# Patient Record
Sex: Female | Born: 1940
Health system: Southern US, Community
[De-identification: ages and names within clinical notes are randomized; demographics above are authoritative.]

## PROBLEM LIST (undated history)

## (undated) DIAGNOSIS — K219 Gastro-esophageal reflux disease without esophagitis: Secondary | ICD-10-CM

## (undated) DIAGNOSIS — M069 Rheumatoid arthritis, unspecified: Secondary | ICD-10-CM

## (undated) DIAGNOSIS — G629 Polyneuropathy, unspecified: Secondary | ICD-10-CM

## (undated) DIAGNOSIS — G4730 Sleep apnea, unspecified: Secondary | ICD-10-CM

## (undated) DIAGNOSIS — T8859XA Other complications of anesthesia, initial encounter: Secondary | ICD-10-CM

## (undated) DIAGNOSIS — D126 Benign neoplasm of colon, unspecified: Secondary | ICD-10-CM

## (undated) DIAGNOSIS — T4145XA Adverse effect of unspecified anesthetic, initial encounter: Secondary | ICD-10-CM

## (undated) DIAGNOSIS — Z9289 Personal history of other medical treatment: Secondary | ICD-10-CM

## (undated) DIAGNOSIS — K449 Diaphragmatic hernia without obstruction or gangrene: Secondary | ICD-10-CM

## (undated) DIAGNOSIS — I10 Essential (primary) hypertension: Secondary | ICD-10-CM

## (undated) DIAGNOSIS — S060XAA Concussion with loss of consciousness status unknown, initial encounter: Secondary | ICD-10-CM

## (undated) HISTORY — DX: Rheumatoid arthritis, unspecified: M06.9

## (undated) HISTORY — DX: Sleep apnea, unspecified: G47.30

## (undated) HISTORY — DX: Diaphragmatic hernia without obstruction or gangrene: K44.9

## (undated) HISTORY — PX: SQUAMOUS CELL CARCINOMA EXCISION: SHX2433

## (undated) HISTORY — PX: EYE SURGERY: SHX253

## (undated) HISTORY — DX: Gastro-esophageal reflux disease without esophagitis: K21.9

## (undated) HISTORY — DX: Benign neoplasm of colon, unspecified: D12.6

## (undated) HISTORY — PX: TRABECULECTOMY: SHX107

## (undated) HISTORY — PX: CATARACT EXTRACTION, BILATERAL: SHX1313

## (undated) HISTORY — PX: COLONOSCOPY W/ POLYPECTOMY: SHX1380

## (undated) HISTORY — DX: Concussion with loss of consciousness status unknown, initial encounter: S06.0XAA

## (undated) HISTORY — PX: UPPER GASTROINTESTINAL ENDOSCOPY: SHX188

## (undated) HISTORY — DX: Polyneuropathy, unspecified: G62.9

## (undated) HISTORY — PX: DILATION AND CURETTAGE OF UTERUS: SHX78

## (undated) HISTORY — PX: SHOULDER SURGERY: SHX246

## (undated) HISTORY — DX: Personal history of other medical treatment: Z92.89

---

## 1998-01-09 ENCOUNTER — Other Ambulatory Visit: Admission: RE | Admit: 1998-01-09 | Discharge: 1998-01-09 | Payer: Self-pay | Admitting: Obstetrics and Gynecology

## 1999-01-22 ENCOUNTER — Other Ambulatory Visit: Admission: RE | Admit: 1999-01-22 | Discharge: 1999-01-22 | Payer: Self-pay | Admitting: Obstetrics and Gynecology

## 1999-05-11 ENCOUNTER — Encounter: Payer: Self-pay | Admitting: Internal Medicine

## 1999-05-11 ENCOUNTER — Ambulatory Visit (HOSPITAL_COMMUNITY): Admission: RE | Admit: 1999-05-11 | Discharge: 1999-05-11 | Payer: Self-pay | Admitting: Internal Medicine

## 2000-01-24 ENCOUNTER — Other Ambulatory Visit: Admission: RE | Admit: 2000-01-24 | Discharge: 2000-01-24 | Payer: Self-pay | Admitting: Obstetrics and Gynecology

## 2001-01-26 ENCOUNTER — Other Ambulatory Visit: Admission: RE | Admit: 2001-01-26 | Discharge: 2001-01-26 | Payer: Self-pay | Admitting: Obstetrics and Gynecology

## 2001-10-07 ENCOUNTER — Encounter (INDEPENDENT_AMBULATORY_CARE_PROVIDER_SITE_OTHER): Payer: Self-pay | Admitting: Specialist

## 2001-10-07 ENCOUNTER — Ambulatory Visit (HOSPITAL_COMMUNITY): Admission: RE | Admit: 2001-10-07 | Discharge: 2001-10-07 | Payer: Self-pay | Admitting: Obstetrics and Gynecology

## 2002-06-03 DIAGNOSIS — D126 Benign neoplasm of colon, unspecified: Secondary | ICD-10-CM

## 2002-06-03 HISTORY — DX: Benign neoplasm of colon, unspecified: D12.6

## 2003-03-10 ENCOUNTER — Encounter: Payer: Self-pay | Admitting: Rheumatology

## 2003-03-10 ENCOUNTER — Encounter: Admission: RE | Admit: 2003-03-10 | Discharge: 2003-03-10 | Payer: Self-pay | Admitting: Rheumatology

## 2004-03-02 ENCOUNTER — Encounter: Payer: Self-pay | Admitting: Internal Medicine

## 2004-04-13 ENCOUNTER — Ambulatory Visit: Payer: Self-pay | Admitting: Internal Medicine

## 2004-11-20 ENCOUNTER — Ambulatory Visit: Payer: Self-pay | Admitting: Internal Medicine

## 2005-02-26 ENCOUNTER — Encounter (INDEPENDENT_AMBULATORY_CARE_PROVIDER_SITE_OTHER): Payer: Self-pay | Admitting: *Deleted

## 2005-02-26 ENCOUNTER — Ambulatory Visit (HOSPITAL_COMMUNITY): Admission: RE | Admit: 2005-02-26 | Discharge: 2005-02-26 | Payer: Self-pay | Admitting: Obstetrics and Gynecology

## 2005-03-25 ENCOUNTER — Ambulatory Visit: Payer: Self-pay | Admitting: Internal Medicine

## 2005-06-26 ENCOUNTER — Ambulatory Visit: Payer: Self-pay | Admitting: Internal Medicine

## 2005-07-10 ENCOUNTER — Ambulatory Visit: Payer: Self-pay | Admitting: Internal Medicine

## 2005-07-16 ENCOUNTER — Ambulatory Visit: Payer: Self-pay | Admitting: Gastroenterology

## 2005-07-19 ENCOUNTER — Ambulatory Visit: Payer: Self-pay | Admitting: Gastroenterology

## 2005-07-22 ENCOUNTER — Ambulatory Visit: Payer: Self-pay | Admitting: Gastroenterology

## 2005-08-13 ENCOUNTER — Ambulatory Visit: Payer: Self-pay | Admitting: Gastroenterology

## 2006-04-10 ENCOUNTER — Ambulatory Visit: Payer: Self-pay | Admitting: Internal Medicine

## 2006-07-10 ENCOUNTER — Ambulatory Visit: Payer: Self-pay | Admitting: Internal Medicine

## 2006-10-17 DIAGNOSIS — N649 Disorder of breast, unspecified: Secondary | ICD-10-CM | POA: Insufficient documentation

## 2006-10-17 DIAGNOSIS — E785 Hyperlipidemia, unspecified: Secondary | ICD-10-CM | POA: Insufficient documentation

## 2006-10-17 DIAGNOSIS — I059 Rheumatic mitral valve disease, unspecified: Secondary | ICD-10-CM | POA: Insufficient documentation

## 2006-10-22 ENCOUNTER — Encounter: Payer: Self-pay | Admitting: Internal Medicine

## 2006-10-22 ENCOUNTER — Ambulatory Visit: Payer: Self-pay | Admitting: Internal Medicine

## 2006-10-30 ENCOUNTER — Encounter: Payer: Self-pay | Admitting: Internal Medicine

## 2006-11-13 ENCOUNTER — Encounter: Payer: Self-pay | Admitting: Internal Medicine

## 2007-01-19 ENCOUNTER — Telehealth (INDEPENDENT_AMBULATORY_CARE_PROVIDER_SITE_OTHER): Payer: Self-pay | Admitting: *Deleted

## 2007-02-18 ENCOUNTER — Telehealth (INDEPENDENT_AMBULATORY_CARE_PROVIDER_SITE_OTHER): Payer: Self-pay | Admitting: *Deleted

## 2007-02-24 ENCOUNTER — Encounter: Payer: Self-pay | Admitting: Internal Medicine

## 2007-04-07 ENCOUNTER — Ambulatory Visit: Payer: Self-pay | Admitting: Internal Medicine

## 2007-05-14 ENCOUNTER — Encounter: Payer: Self-pay | Admitting: Internal Medicine

## 2007-08-13 ENCOUNTER — Ambulatory Visit: Payer: Self-pay | Admitting: Internal Medicine

## 2007-08-13 DIAGNOSIS — T887XXA Unspecified adverse effect of drug or medicament, initial encounter: Secondary | ICD-10-CM | POA: Insufficient documentation

## 2007-08-18 ENCOUNTER — Encounter (INDEPENDENT_AMBULATORY_CARE_PROVIDER_SITE_OTHER): Payer: Self-pay | Admitting: *Deleted

## 2007-08-18 LAB — CONVERTED CEMR LAB
Basophils Absolute: 0 10*3/uL (ref 0.0–0.1)
Basophils Relative: 1.1 % — ABNORMAL HIGH (ref 0.0–1.0)
Eosinophils Absolute: 0.1 10*3/uL (ref 0.0–0.6)
Eosinophils Relative: 2 % (ref 0.0–5.0)
HCT: 37.9 % (ref 36.0–46.0)
Hemoglobin: 12.6 g/dL (ref 12.0–15.0)
Lymphocytes Relative: 55.2 % — ABNORMAL HIGH (ref 12.0–46.0)
MCHC: 33.2 g/dL (ref 30.0–36.0)
MCV: 94.6 fL (ref 78.0–100.0)
Monocytes Absolute: 0.4 10*3/uL (ref 0.2–0.7)
Monocytes Relative: 10.1 % (ref 3.0–11.0)
Neutro Abs: 1.4 10*3/uL (ref 1.4–7.7)
Neutrophils Relative %: 31.6 % — ABNORMAL LOW (ref 43.0–77.0)
Platelets: 366 10*3/uL (ref 150–400)
RBC: 4.01 M/uL (ref 3.87–5.11)
RDW: 12.7 % (ref 11.5–14.6)
WBC: 4.3 10*3/uL — ABNORMAL LOW (ref 4.5–10.5)

## 2007-10-20 ENCOUNTER — Encounter: Payer: Self-pay | Admitting: Internal Medicine

## 2007-12-23 ENCOUNTER — Encounter: Payer: Self-pay | Admitting: Internal Medicine

## 2008-01-22 ENCOUNTER — Encounter: Payer: Self-pay | Admitting: Internal Medicine

## 2008-02-24 ENCOUNTER — Ambulatory Visit: Payer: Self-pay | Admitting: Internal Medicine

## 2008-02-25 ENCOUNTER — Encounter: Payer: Self-pay | Admitting: Internal Medicine

## 2008-03-03 ENCOUNTER — Encounter: Payer: Self-pay | Admitting: Internal Medicine

## 2008-04-04 ENCOUNTER — Telehealth: Payer: Self-pay | Admitting: Internal Medicine

## 2008-04-06 ENCOUNTER — Telehealth (INDEPENDENT_AMBULATORY_CARE_PROVIDER_SITE_OTHER): Payer: Self-pay | Admitting: *Deleted

## 2008-04-12 ENCOUNTER — Ambulatory Visit: Payer: Self-pay | Admitting: Internal Medicine

## 2008-05-17 ENCOUNTER — Encounter: Payer: Self-pay | Admitting: Internal Medicine

## 2008-09-23 ENCOUNTER — Encounter: Payer: Self-pay | Admitting: Internal Medicine

## 2008-12-07 ENCOUNTER — Ambulatory Visit: Payer: Self-pay | Admitting: Internal Medicine

## 2008-12-07 DIAGNOSIS — K219 Gastro-esophageal reflux disease without esophagitis: Secondary | ICD-10-CM | POA: Insufficient documentation

## 2008-12-07 DIAGNOSIS — Z860100 Personal history of colon polyps, unspecified: Secondary | ICD-10-CM | POA: Insufficient documentation

## 2008-12-07 DIAGNOSIS — M069 Rheumatoid arthritis, unspecified: Secondary | ICD-10-CM | POA: Insufficient documentation

## 2008-12-13 ENCOUNTER — Encounter (INDEPENDENT_AMBULATORY_CARE_PROVIDER_SITE_OTHER): Payer: Self-pay | Admitting: *Deleted

## 2008-12-13 LAB — CONVERTED CEMR LAB
Calcium: 9.6 mg/dL (ref 8.4–10.5)
Hgb A1c MFr Bld: 5.7 % (ref 4.6–6.5)
TSH: 1.02 microintl units/mL (ref 0.35–5.50)

## 2008-12-14 ENCOUNTER — Encounter (INDEPENDENT_AMBULATORY_CARE_PROVIDER_SITE_OTHER): Payer: Self-pay | Admitting: *Deleted

## 2008-12-27 LAB — CONVERTED CEMR LAB: Vit D, 25-Hydroxy: 136 ng/mL — ABNORMAL HIGH (ref 30–89)

## 2009-01-19 ENCOUNTER — Encounter: Payer: Self-pay | Admitting: Internal Medicine

## 2009-02-09 ENCOUNTER — Ambulatory Visit: Payer: Self-pay | Admitting: Gastroenterology

## 2009-02-16 ENCOUNTER — Encounter: Payer: Self-pay | Admitting: Internal Medicine

## 2009-02-24 ENCOUNTER — Ambulatory Visit: Payer: Self-pay | Admitting: Gastroenterology

## 2009-02-24 ENCOUNTER — Encounter: Payer: Self-pay | Admitting: Gastroenterology

## 2009-02-24 LAB — CONVERTED CEMR LAB: UREASE: NEGATIVE

## 2009-02-28 ENCOUNTER — Encounter: Payer: Self-pay | Admitting: Gastroenterology

## 2009-03-01 ENCOUNTER — Telehealth (INDEPENDENT_AMBULATORY_CARE_PROVIDER_SITE_OTHER): Payer: Self-pay | Admitting: *Deleted

## 2009-03-06 ENCOUNTER — Encounter: Payer: Self-pay | Admitting: Internal Medicine

## 2009-03-10 ENCOUNTER — Ambulatory Visit: Payer: Self-pay | Admitting: Internal Medicine

## 2009-03-13 ENCOUNTER — Encounter (INDEPENDENT_AMBULATORY_CARE_PROVIDER_SITE_OTHER): Payer: Self-pay | Admitting: *Deleted

## 2009-03-13 ENCOUNTER — Telehealth (INDEPENDENT_AMBULATORY_CARE_PROVIDER_SITE_OTHER): Payer: Self-pay | Admitting: *Deleted

## 2009-03-13 LAB — CONVERTED CEMR LAB
ALT: 21 units/L (ref 0–35)
AST: 24 units/L (ref 0–37)
Albumin: 3.9 g/dL (ref 3.5–5.2)
Alkaline Phosphatase: 60 units/L (ref 39–117)
Bilirubin, Direct: 0.1 mg/dL (ref 0.0–0.3)
Cholesterol: 155 mg/dL (ref 0–200)
HDL: 50.5 mg/dL (ref >39.00)
LDL Cholesterol: 80 mg/dL (ref 0–99)
Total Bilirubin: 0.7 mg/dL (ref 0.3–1.2)
Total CHOL/HDL Ratio: 3
Total Protein: 6.8 g/dL (ref 6.0–8.3)
Triglycerides: 125 mg/dL (ref 0.0–149.0)
VLDL: 25 mg/dL (ref 0.0–40.0)

## 2009-03-21 ENCOUNTER — Encounter: Payer: Self-pay | Admitting: Internal Medicine

## 2009-03-30 ENCOUNTER — Telehealth: Payer: Self-pay | Admitting: Physician Assistant

## 2009-04-04 ENCOUNTER — Ambulatory Visit: Payer: Self-pay | Admitting: Internal Medicine

## 2009-04-07 ENCOUNTER — Ambulatory Visit: Payer: Self-pay | Admitting: Internal Medicine

## 2009-04-08 LAB — CONVERTED CEMR LAB
ALT: 40 units/L — ABNORMAL HIGH (ref 0–35)
AST: 37 units/L (ref 0–37)
Albumin: 3.4 g/dL — ABNORMAL LOW (ref 3.5–5.2)
Alkaline Phosphatase: 77 units/L (ref 39–117)
Basophils Absolute: 0 10*3/uL (ref 0.0–0.1)
Basophils Relative: 0.7 % (ref 0.0–3.0)
Bilirubin, Direct: 0 mg/dL (ref 0.0–0.3)
Eosinophils Absolute: 0.1 10*3/uL (ref 0.0–0.7)
Eosinophils Relative: 2.4 % (ref 0.0–5.0)
HCT: 36.4 % (ref 36.0–46.0)
Hemoglobin: 12.3 g/dL (ref 12.0–15.0)
Lymphocytes Relative: 29.9 % (ref 12.0–46.0)
Lymphs Abs: 1.7 10*3/uL (ref 0.7–4.0)
MCHC: 33.9 g/dL (ref 30.0–36.0)
MCV: 95.8 fL (ref 78.0–100.0)
Monocytes Absolute: 0.6 10*3/uL (ref 0.1–1.0)
Monocytes Relative: 9.8 % (ref 3.0–12.0)
Neutro Abs: 3.3 10*3/uL (ref 1.4–7.7)
Neutrophils Relative %: 57.2 % (ref 43.0–77.0)
Platelets: 400 10*3/uL (ref 150.0–400.0)
RBC: 3.8 M/uL — ABNORMAL LOW (ref 3.87–5.11)
RDW: 13 % (ref 11.5–14.6)
Total Bilirubin: 0.4 mg/dL (ref 0.3–1.2)
Total Protein: 7 g/dL (ref 6.0–8.3)
Vitamin B-12: 557 pg/mL (ref 211–911)
WBC: 5.7 10*3/uL (ref 4.5–10.5)

## 2009-04-10 ENCOUNTER — Encounter (INDEPENDENT_AMBULATORY_CARE_PROVIDER_SITE_OTHER): Payer: Self-pay | Admitting: *Deleted

## 2009-04-20 ENCOUNTER — Telehealth: Payer: Self-pay | Admitting: Gastroenterology

## 2009-05-16 ENCOUNTER — Telehealth (INDEPENDENT_AMBULATORY_CARE_PROVIDER_SITE_OTHER): Payer: Self-pay | Admitting: *Deleted

## 2009-06-28 ENCOUNTER — Ambulatory Visit: Payer: Self-pay | Admitting: Internal Medicine

## 2009-06-28 DIAGNOSIS — H6980 Other specified disorders of Eustachian tube, unspecified ear: Secondary | ICD-10-CM | POA: Insufficient documentation

## 2009-07-06 ENCOUNTER — Encounter: Payer: Self-pay | Admitting: Internal Medicine

## 2009-07-10 ENCOUNTER — Telehealth: Payer: Self-pay | Admitting: Internal Medicine

## 2009-07-21 ENCOUNTER — Telehealth (INDEPENDENT_AMBULATORY_CARE_PROVIDER_SITE_OTHER): Payer: Self-pay | Admitting: *Deleted

## 2009-07-28 ENCOUNTER — Encounter: Payer: Self-pay | Admitting: Internal Medicine

## 2009-10-16 ENCOUNTER — Encounter: Payer: Self-pay | Admitting: Internal Medicine

## 2009-11-22 ENCOUNTER — Encounter: Payer: Self-pay | Admitting: Internal Medicine

## 2009-12-14 ENCOUNTER — Ambulatory Visit: Payer: Self-pay | Admitting: Internal Medicine

## 2009-12-14 DIAGNOSIS — N959 Unspecified menopausal and perimenopausal disorder: Secondary | ICD-10-CM | POA: Insufficient documentation

## 2009-12-14 DIAGNOSIS — R42 Dizziness and giddiness: Secondary | ICD-10-CM | POA: Insufficient documentation

## 2009-12-21 LAB — CONVERTED CEMR LAB
Calcium: 9.9 mg/dL (ref 8.4–10.5)
Cholesterol: 238 mg/dL — ABNORMAL HIGH (ref 0–200)
Direct LDL: 160.4 mg/dL
Glucose, Bld: 94 mg/dL (ref 70–99)
HDL: 73.2 mg/dL (ref >39.00)
Total CHOL/HDL Ratio: 3
Triglycerides: 81 mg/dL (ref 0.0–149.0)
VLDL: 16.2 mg/dL (ref 0.0–40.0)
Vit D, 25-Hydroxy: 46 ng/mL (ref 30–89)

## 2010-01-30 ENCOUNTER — Ambulatory Visit: Payer: Self-pay | Admitting: Gastroenterology

## 2010-01-30 ENCOUNTER — Telehealth: Payer: Self-pay | Admitting: Gastroenterology

## 2010-01-30 DIAGNOSIS — K449 Diaphragmatic hernia without obstruction or gangrene: Secondary | ICD-10-CM | POA: Insufficient documentation

## 2010-01-30 DIAGNOSIS — R490 Dysphonia: Secondary | ICD-10-CM | POA: Insufficient documentation

## 2010-02-19 ENCOUNTER — Encounter: Payer: Self-pay | Admitting: Gastroenterology

## 2010-02-19 ENCOUNTER — Ambulatory Visit (HOSPITAL_COMMUNITY): Admission: RE | Admit: 2010-02-19 | Discharge: 2010-02-19 | Payer: Self-pay | Admitting: Surgery

## 2010-02-26 ENCOUNTER — Ambulatory Visit: Payer: Self-pay | Admitting: Gastroenterology

## 2010-03-07 ENCOUNTER — Encounter: Payer: Self-pay | Admitting: Internal Medicine

## 2010-03-14 ENCOUNTER — Encounter: Payer: Self-pay | Admitting: Internal Medicine

## 2010-03-19 ENCOUNTER — Telehealth: Payer: Self-pay | Admitting: Gastroenterology

## 2010-03-21 ENCOUNTER — Encounter: Payer: Self-pay | Admitting: Gastroenterology

## 2010-04-09 ENCOUNTER — Telehealth: Payer: Self-pay | Admitting: Gastroenterology

## 2010-04-10 ENCOUNTER — Telehealth: Payer: Self-pay | Admitting: Internal Medicine

## 2010-04-19 ENCOUNTER — Encounter: Payer: Self-pay | Admitting: Internal Medicine

## 2010-07-03 NOTE — Assessment & Plan Note (Signed)
Summary: sinus/kdc   Vital Signs:  Patient profile:   70 year old female Weight:      141.2 pounds Temp:     98.6 degrees F oral Pulse rate:   64 / minute Resp:     13 per minute BP sitting:   110 / 70  (left arm) Cuff size:   large  Vitals Entered By: Shonna Chock (June 28, 2009 10:54 AM) CC: Sinus concerns x 1 month, URI symptoms Comments REVIEWED MED LIST, PATIENT AGREED DOSE AND INSTRUCTION CORRECT    Primary Care Provider:  Marga Melnick, MD  CC:  Sinus concerns x 1 month and URI symptoms.  History of Present Illness:  URI Symptoms      This is a 70 year old woman who presents with URI symptoms as ear pressure & nasal congestion X 3 weeks .  The patient reports nasal congestion and ear pressure w/o  purulent nasal discharge, sore throat, dry cough, or  sick contacts.  The patient denies fever, stiff neck, dyspnea, wheezing, rash, vomiting,or diarrhea.  The patient also reports sneezing and severe fatigue(chronic).  The patient denies itchy watery eyes, headache, and muscle aches.  Risk factors for Strep sinusitis include bilateral facial pain (R > L) and tooth pain on R.  Dr Lindi Adie noted "sinus changes " on R 01/25. No TMJ diagnosis made.The patient denies the following risk factors for Strep sinusitis: unilateral nasal discharge and tender adenopathy.  Rx: Neti pot , Nasonex. Treated for sinusitis in 04/2009. Labs to monitor MTX & Embrel  for RA("Blood counts, kidney, liver" ) done 06/26/2009 were "normal".  Allergies: 1)  ! Sulfa  Review of Systems General:  Denies chills and sweats. ENT:  No fronal headaches or purulence. Resp:  Denies cough, shortness of breath, sputum productive, and wheezing.  Physical Exam  General:  well-nourished,in no acute distress; alert,appropriate and cooperative throughout examination Ears:  External ear exam shows no significant lesions or deformities.  Otoscopic examination reveals clear canals, tympanic membranes are intact bilaterally  without bulging, retraction, inflammation or discharge. Hearing is grossly normal bilaterally.TMs dull Nose:  External nasal examination shows no deformity or inflammation. Nasal mucosa are pink and moist without lesions or exudates. Mouth:  Oral mucosa and oropharynx without lesions or exudates.  Teeth in good repair. Minimal erythema  of palate.Minor dislocation of TMJs. Lungs:  Normal respiratory effort, chest expands symmetrically. Lungs are clear to auscultation, no crackles or wheezes. Skin:  Bandaged op site L neck (Epidermoid inclusion cyst resected 01/19) Cervical Nodes:  No lymphadenopathy noted Axillary Nodes:  No palpable lymphadenopathy   Impression & Recommendations:  Problem # 1:  SINUSITIS- ACUTE-NOS (ICD-461.9)  The following medications were removed from the medication list:    Amoxicillin 500 Mg Caps (Amoxicillin) .Marland Kitchen... 1 three times a day Her updated medication list for this problem includes:    Cefuroxime Axetil 500 Mg Tabs (Cefuroxime axetil) .Marland Kitchen... 1 two times a day    Fluticasone Propionate 50 Mcg/act Susp (Fluticasone propionate) .Marland Kitchen... 1 spray two times a day as discussed  Problem # 2:  EUSTACHIAN TUBE DYSFUNCTION, RIGHT (ICD-381.81)  Problem # 3:  RHEUMATOID ARTHRITIS (ICD-714.0) on Immunosuppresants(Embrel & MTX)  Complete Medication List: 1)  Methatrexate  .... As directed 2)  Premarin 0.625 Mg Tabs (Estrogens conjugated) .Marland Kitchen.. 1 by mouth once daily 3)  Prometrium 100 Mg Caps (Progesterone micronized) .Marland Kitchen.. 1 by mouth once daily 4)  Multivitamin  .Marland Kitchen.. 1 tablet by mouth once daily 5)  Vit D  and Caltrate  .Marland Kitchen.. 1 tablet by mouth once daily 6)  Folic Acid 1 Mg Tabs (Folic acid) .... 2 by mouth once daily 7)  Bayer Low Strength 81 Mg Tbec (Aspirin) .Marland Kitchen.. 1 by mouth once daily 8)  Istalol 0.5 % Soln (Timolol maleate) .... Apply 1 drop daily (am) 9)  Alphagan P 0.15 % Soln (Brimonidine tartrate) .Marland Kitchen.. 1 dropp in left eye three times a day 10)  Xalatan 0.005 % Soln  (Latanoprost) .... Daily 11)  Prednisolone Acetate 1 % Susp (Prednisolone acetate) .... As directed 12)  Omega 3 Krill  .Marland Kitchen.. 1 by mouth once daily 13)  Ambien 10 Mg Tabs (Zolpidem tartrate) .Marland Kitchen.. 1 tablet by mouth at bedtime as needed 14)  Enbrel 50 Mg/ml Soln (Etanercept) .Marland Kitchen.. 1 injection weekly 15)  Methotrexate Sodium 250 Mg/52ml Soln (Methotrexate sodium) .... 0.8 mg once weekly 16)  Align Caps (Misc intestinal flora regulat) .... Take one capsule by mouth daily 17)  Nexium 40 Mg Cpdr (Esomeprazole magnesium) .Marland Kitchen.. 1 capsule each day 30 minutes before meal 18)  Pilocarpine Hcl 0.5 % Soln (Pilocarpine hcl) .... 4 times daily 19)  Cefuroxime Axetil 500 Mg Tabs (Cefuroxime axetil) .Marland Kitchen.. 1 two times a day 20)  Fluticasone Propionate 50 Mcg/act Susp (Fluticasone propionate) .Marland Kitchen.. 1 spray two times a day as discussed  Patient Instructions: 1)  Drink as much fluid as you can tolerate for the next few days. 2)  Look on WebMD OR  LocalsBoard.pl to learn more about your condition (Eustachian Tube Dysfunction). CT of sinuses if no better Prescriptions: FLUTICASONE PROPIONATE 50 MCG/ACT SUSP (FLUTICASONE PROPIONATE) 1 spray two times a day as discussed  #1 x 5   Entered and Authorized by:   Marga Melnick MD   Signed by:   Marga Melnick MD on 06/28/2009   Method used:   Faxed to ...       [facility]. [phone]* (retail)       [phone] [address].       [place], [place]  [address]       Ph: [phone]       Fax: [phone]   RxID:   [id number] CEFUROXIME AXETIL 500 MG TABS (CEFUROXIME AXETIL) 1 two times a day  #20 x 0   Entered and Authorized by:   Marga Melnick MD   Signed by:   Marga Melnick MD on 06/28/2009   Method used:   Faxed to ...       [facility]. [phone]* (retail)       [phone] [address].       [place], [place]  [address]       Ph: [phone]       Fax: [phone]   RxID:    [id number]   Appended Document: sinus/kdc Note : off statin since 04/2009 due to GI issues as per GI PA.Recommend fasting NMR Lipoprofile in next 3 months (272.4)

## 2010-07-03 NOTE — Letter (Signed)
Summary: [facility]  [facility]   Imported By: Lanelle Bal 05/24/2008 11:05:25  _____________________________________________________________________  External Attachment:    Type:   Image     Comment:   External Document

## 2010-07-03 NOTE — Procedures (Signed)
Summary: Colonoscopy   Colonoscopy  Procedure date:  07/22/2005  Findings:      Location:  [facility].    Colonoscopy  Procedure date:  07/22/2005  Findings:      Location:  [facility].   Patient Name: Felicia [name], Felicia [name] MRN:  Procedure Procedures: Colonoscopy CPT: 315 [id number].  Personnel: Endoscopist: Vania Rea. Jarold Motto, MD.  Exam Location: Exam performed in Outpatient Clinic. Outpatient  Patient Consent: Procedure, Alternatives, Risks and Benefits discussed, consent obtained, from patient. Consent was obtained by the RN.  Indications Symptoms: Abdominal pain / bloating. Change in bowel habits.  Surveillance of: Adenomatous Polyp(s).  History  Current Medications: Patient is not currently taking Coumadin.  Pre-Exam Physical: Performed Jul 22, 2005. Cardio-pulmonary exam, Rectal exam, Abdominal exam, Extremity exam, Mental status exam WNL.  Comments: Pt. history reviewed/updated, physical exam performed prior to initiation of sedation? Exam Exam: Extent of exam reached: Ileum, extent intended: Cecum.  The cecum was identified by appendiceal orifice and IC valve. Patient position: on left side. Duration of exam: 20 minutes. Colon retroflexion performed. Images taken. ASA Classification: II. Tolerance: excellent.  Monitoring: Pulse and BP monitoring, Oximetry used. Supplemental O2 given. at 2 Liters.  Colon Prep Used Golytely for colon prep. Prep results: excellent.  Sedation Meds: Patient assessed and found to be appropriate for moderate (conscious) sedation. Fentanyl 50 mcg. given IV. Versed 7 mg. given IV.  Instrument(s): CF 140L. Serial [id number].  Findings - NORMAL EXAM: Ileum. Not Seen: AVM's. Crohn's.  - NORMAL EXAM: Cecum to Rectum. Polyps. AVM's. Colitis. Tumors. Melanosis. Crohn's. Diverticulosis. Hemorrhoids.   Assessment Normal examination.  Events  Unplanned Interventions: No intervention was required.  Plans  Medication Plan: Continue current medications.  Patient Education: Patient given standard instructions for: a normal exam.  Disposition: After procedure patient sent to recovery. After recovery patient sent home.  Scheduling/Referral: Follow-Up prn.    CC: Titus Dubin. Alwyn Ren, MD  This report was created from the original endoscopy report, which was reviewed and signed by the above listed endoscopist.

## 2010-07-03 NOTE — Miscellaneous (Signed)
Summary: needs refills for Dexilant on call-back  Clinical Lists Changes  Medications: Added new medication of DEXILANT 60 MG CPDR (DEXLANSOPRAZOLE) Take 30 min before 1st meal of the day - Signed Rx of DEXILANT 60 MG CPDR (DEXLANSOPRAZOLE) Take 30 min before 1st meal of the day;  #31 x 6;  Signed;  Entered by: [name] Church RN II;  Authorized by: Mardella Layman MD [facility];  Method used: Electronically to [facility]. #[id number]*, [address] [place], [place], [place]  [address], Ph: [phone], Fax: [phone] Observations: Added new observation of ALLERGY REV: Done (02/27/2009 8:48)    Prescriptions: DEXILANT 60 MG CPDR (DEXLANSOPRAZOLE) Take 30 min before 1st meal of the day  #31 x 6   Entered by:   [name] Church RN II   Authorized by:   Mardella Layman MD [facility]   Signed by:   [name] Church RN II on 02/27/2009   Method used:   Electronically to        [facility]. [phone]* (retail)       [phone] [address]       [place], [place]  [address]       Ph: [phone]       Fax: [phone]   RxID:   [id number]

## 2010-07-03 NOTE — Letter (Signed)
Summary: [facility]  [facility]   Imported By: Freddy Jaksch 10/23/2007 13:42:51  _____________________________________________________________________  External Attachment:    Type:   Image     Comment:   External Document  Appended Document: [facility] trochanteric bursitis, seroneg RA

## 2010-07-03 NOTE — Miscellaneous (Signed)
Summary: clotest  Clinical Lists Changes  Problems: Added new problem of GASTRITIS (ICD-535.50) Orders: Added new Test order of TLB-H Pylori Screen Gastric Biopsy (83013-CLOTEST) - Signed 

## 2010-07-03 NOTE — Consult Note (Signed)
Summary: [facility]  [facility]   Imported By: Lanelle Bal 03/11/2008 10:18:54  _____________________________________________________________________  External Attachment:    Type:   Image     Comment:   External Document

## 2010-07-03 NOTE — Progress Notes (Signed)
Summary: HOP---REFILL  Phone Note Call from Patient   Caller: Patient Summary of Call: PATIENT IS CALLING WITH QUESTION ABOUT HER ARTHRIIS (ENBRIL--METHOPREXATE SHOT) IS WHAT SHE IS TAKING NOW. HAVE QUESTION ABOUT THE H1N1 ABOUT TAKING THIS WITH THE MEDICATION SHE IS TAKING  NOW. WOULD IT PROTECT HER FROM ANY INFECTION. Initial call taken by: Freddy Jaksch,  April 04, 2008 9:39 AM  Follow-up for Phone Call        dr Markella Dao pls  pt want to know if she needs to get h1n1 vaccine Follow-up by: Jeremy Johann CMA,  April 05, 2008 11:33 AM  Additional Follow-up for Phone Call Additional follow up Details #1::        The Rheumatologist [place] H1N1 shot due to [facility]  therapy. I would encourage if possible. Additional Follow-up by: Marga Melnick MD,  April 05, 2008 11:50 AM    Additional Follow-up for Phone Call Additional follow up Details #2::    left detail message for pt on machine as to dr hop thoughts on h1n1 vaccine. Follow-up by: Jeremy Johann CMA,  April 05, 2008 4:15 PM

## 2010-07-03 NOTE — Progress Notes (Signed)
Summary: Test results  Phone Note Call from Patient Call back at Home Phone [phone]   Caller: Patient Call For: Dr. Jarold Motto Reason for Call: Lab or Test Results Summary of Call: Asking to speak directly to [facility] about manometry results. Pt. is "frustrated that she had to pursue getting her results instead of the office calling with her results." Initial call taken by: Karna Christmas,  April 09, 2010 2:52 PM  Follow-up for Phone Call        patient upset that she has not yet heard about her result but I advised her that I spoke with her on 10/17 about her results, she does recall that now but thinks we should mail out copies of all reports like Dr. Caryl Never office does I have advised her that she can get her medical records from our medical records dept if she wishes but it is not our practice to mail reoprts. She is also upset that noone in medical records has called her back about getting her records says  that she left a message on their machine 2 weeks ago but she does not know who voicemail it was on. I have transfered her to medical records to speak with Norwood Levo. I also advised her to make an appt with Dr. Alwyn Ren and I have routed him the reports again.  Follow-up by: Harlow Mares CMA Duncan Dull),  April 09, 2010 3:18 PM

## 2010-07-03 NOTE — Assessment & Plan Note (Signed)
Summary: YEARLY AND LAB/CDJ   Vital Signs:  Patient profile:   70 year old female Height:      66 inches Weight:      140.8 pounds BMI:     22.81 Temp:     97.8 degrees F oral Pulse rate:   60 / minute Resp:     14 per minute BP sitting:   118 / 76  (left arm) Cuff size:   large  Vitals Entered By: Shonna Chock CMA (December 14, 2009 10:56 AM) CC: CPX with fasting labs  Comments REVIEWED MED LIST, PATIENT AGREED DOSE AND INSTRUCTION CORRECT    Primary Care Provider:  Marga Melnick, MD  CC:  CPX with fasting labs .  History of Present Illness: Here for Medicare AWV: 1.Risk factors based on Past M, S, F history:RA on immunosuppressant therapy;GERD; colon polyps;dyslipidemia. Labs from Dr Corliss Skains reviewed: H/H 11.9/35.4 on 10/16/2009. 2.Physical Activities: see Entry data 3.Depression/mood: denied 4.Hearing: whisper heard  @ 6 ft 5.ADL's: no limitations despite RA 6.Fall Risk: see Entry data 7.Home Safety: no risk described 8.Height, weight, &visual acuity:vision intact with lenses reading wall charts. Dr Nedra Hai, [facility] monitors Glaucoma every 2-3 months 9.Counseling: none requested 10.Labs ordered based on risk factors: see Orders 11. Referral Coordination: Dr Pollyann Kennedy if Eustachian tube dysfunction no better with meds 12. Care Plan: see Instructions; updating POA,etc 13.  Cognitive Assessment: normal orientation , mood /affect, memory & recall . "WORLD" spelled backwards   Preventive Screening-Counseling & Management  Alcohol-Tobacco     Alcohol drinks/day: 0     Smoking Status: quit > 6 months     Packs/Day: 1/2 ppd X 5 years     Year Quit: 1972  Caffeine-Diet-Exercise     Caffeine use/day: 2 cups/day     Diet Comments: heart healthy     Does Patient Exercise: yes     Type of exercise: Tai Chi     Exercise (avg: min/session): <30     Times/week: 4  Hep-HIV-STD-Contraception     Dental Visit-last 6 months yes     Sun Exposure-Excessive: no  Safety-Violence-Falls   Seat Belt Use: yes     Firearms in the Home: no firearms in the home     Violence in the Home: no risk noted     Fall Risk: no risk despite RA; Tai Chi helps balance      Drug Use:  never.        Blood Transfusions:  no.        Travel History:  none in past year.    Allergies: 1)  ! Sulfa  Past History:  Past Medical History: Hyperlipidemia:NMR  2005: LDL 120(1234/729),HDL 59, TG 96. LDL goal = <135. Glaucoma Colonic polyps X 1,   PMH  of 2004, negative  2007 Rheumatoid Arthritis, Dr Corliss Skains GERD  Past Surgical History: D&C X3; Glaucoma surgery X 2 @ [facility] (Iridectomy), Dr Nedra Hai Cataract extraction OD Colon polypectomy 2004(adenoma);  negative   2007, Dr  Jarold Motto; Endoscopy 2010 : gastritis/GERD  Family History: Father: MI @ 73, aortic  valve replacement Mother: COPD, AV Stenosis, Dementia Siblings: brother:UC, ? glaucoma; PGF: pancreatic cancer;MGM CVA;MGF: COPD  Social History: Retired Smoking Status:  quit > 6 months Packs/Day:  1/2 ppd X 5 years Caffeine use/day:  2 cups/day Dental Care w/in 6 mos.:  yes Sun Exposure-Excessive:  no Seat Belt Use:  yes Fall Risk:  no risk despite RA; Tai Chi helps balance Drug Use:  never Blood Transfusions:  no  Review of Systems General:  Denies chills, fever, sweats, and weight loss. Eyes:  Dry eyes despite tear duct plugs; topical meds help. ENT:  Complains of earache and nasal congestion; denies decreased hearing, difficulty swallowing, ear discharge, hoarseness, and ringing in ears; Pressure in R ear with slight tinnitus. Neti pot once daily as needed . CV:  Complains of lightheadness; denies chest pain or discomfort, leg cramps with exertion, palpitations, and shortness of breath with exertion; Mild edema of hands & feet. Occasional lightheaded/dizziness with neck hyperextended for eyedrops. Resp:  Denies cough, shortness of breath, and sputum productive. GI:  Denies abdominal pain, bloody stools, dark tarry stools, and  indigestion; Occasional globus. GU:  Denies discharge, dysuria, and hematuria. MS:  Complains of stiffness; denies joint redness, joint swelling, low back pain, mid back pain, and thoracic pain. Derm:  Denies changes in nail beds, dryness, and hair loss. Neuro:  Denies disturbances in coordination, numbness, poor balance, and tingling. Psych:  Denies anxiety, depression, easily angered, easily tearful, and irritability. Endo:  Denies cold intolerance, excessive hunger, excessive thirst, excessive urination, and heat intolerance. Heme:  Denies abnormal bruising and bleeding. Allergy:  Denies itching eyes and sneezing.  Physical Exam  General:  well-nourished,in no acute distress; alert,appropriate and cooperative throughout examination Head:  Normocephalic and atraumatic without obvious abnormalities.  Eyes:  No corneal or conjunctival inflammation noted.OD pupil > OS; S/P iridectomy OD. Funduscopic exam benign, without hemorrhages, exudates or papilledema. EOMI ; no nystagmus Ears:  External ear exam shows no significant lesions or deformities.  Otoscopic examination reveals clear canals, tympanic membranes are intact bilaterally without bulging, retraction, inflammation or discharge. Hearing is grossly normal bilaterally. Nose:  External nasal examination shows no deformity or inflammation. Nasal mucosa are pink and moist without lesions or exudates. Mouth:  Oral mucosa and oropharynx without lesions or exudates.  Teeth in good repair. Neck:  No deformities, masses, or tenderness noted. Lungs:  Normal respiratory effort, chest expands symmetrically. Lungs are clear to auscultation, no crackles or wheezes. Heart:  regular rhythm, no murmur, no gallop, no rub, no JVD, no HJR, and bradycardia.  S4 Abdomen:  Bowel sounds positive,abdomen soft and non-tender without masses, organomegaly or hernias noted. Pulses:  R and L carotid,radial,dorsalis pedis and posterior tibial pulses are full and equal  bilaterally Extremities:  No clubbing, cyanosis, edema, or deformity noted with normal full range of motion of all joints. MINOR DIP changes   Neurologic:  alert & oriented X3, strength normal in all extremities, and DTRs symmetrical and normal.   Skin:  Intact without suspicious lesions or rashes Cervical Nodes:  No lymphadenopathy noted Axillary Nodes:  No palpable lymphadenopathy Psych:  memory intact for recent and remote, normally interactive, good eye contact, not anxious appearing, and not depressed appearing.     Impression & Recommendations:  Problem # 1:  PREVENTIVE HEALTH CARE (ICD-V70.0)  Orders: Subsequent annual wellness visit with prevention plan (W4132)  Problem # 2:  EUSTACHIAN TUBE DYSFUNCTION, RIGHT (ICD-381.81)  Problem # 3:  DIZZINESS (ICD-780.4) Positional vertebrobasilar artery compromise  Problem # 4:  GERD (ICD-530.81)  Her updated medication list for this problem includes:    Nexium 40 Mg Cpdr (Esomeprazole magnesium) .Marland Kitchen... 1 capsule each day 30 minutes before meal  Problem # 5:  RHEUMATOID ARTHRITIS (ICD-714.0) as per Dr Corliss Skains  Problem # 6:  POSTMENOPAUSAL SYNDROME (ICD-627.9)  Her updated medication list for this problem includes:    Premarin 0.625 Mg Tabs (Estrogens conjugated) .Marland Kitchen... 1 by  mouth once daily  Orders: Venipuncture (16109) T-Vitamin D (25-Hydroxy) [phone])  Problem # 7:  HYPERCALCEMIA (ICD-275.42)  Orders: Venipuncture (91478) T-Vitamin D (25-Hydroxy) (29562-13086)  Problem # 8:  HYPERLIPIDEMIA (ICD-272.4)  Orders: EKG w/ Interpretation (93000) Venipuncture (57846) TLB-Lipid Panel (80061-LIPID) TLB-Glucose, QUANT (82947-GLU)  Complete Medication List: 1)  Premarin 0.625 Mg Tabs (Estrogens conjugated) .Marland Kitchen.. 1 by mouth once daily 2)  Prometrium 100 Mg Caps (Progesterone micronized) .Marland Kitchen.. 1 by mouth once daily 3)  Multivitamin  .Marland Kitchen.. 1 tablet by mouth once daily 4)  Vit D and Caltrate  .Marland Kitchen.. 1 tablet by mouth once  daily 5)  Bayer Low Strength 81 Mg Tbec (Aspirin) .Marland Kitchen.. 1 by mouth once daily 6)  Istalol 0.5 % Soln (Timolol maleate) .... Apply 1 drop daily (am) (both eyes) 7)  Alphagan P 0.15 % Soln (Brimonidine tartrate) .Marland Kitchen.. 1 dropp in left eye three times a day 8)  Xalatan 0.005 % Soln (Latanoprost) .Marland Kitchen.. 1 drop daily in left eye 9)  Prednisolone Acetate 1 % Susp (Prednisolone acetate) .... As directed 10)  Omega 3 Krill  .Marland Kitchen.. 1 by mouth once daily 11)  Ambien 10 Mg Tabs (Zolpidem tartrate) .Marland Kitchen.. 1 tablet by mouth at bedtime as needed 12)  Enbrel 50 Mg/ml Soln (Etanercept) .Marland Kitchen.. 1 injection weekly 13)  Methotrexate Sodium 25 Mg/ml Soln (Methotrexate sodium) .... Once weekly 14)  Align Caps (Misc intestinal flora regulat) .... Take one capsule by mouth daily 15)  Nexium 40 Mg Cpdr (Esomeprazole magnesium) .Marland Kitchen.. 1 capsule each day 30 minutes before meal 16)  Pilocarpine Hcl 0.5 % Soln (Pilocarpine hcl) .... 4 times daily 17)  Fluticasone Propionate 50 Mcg/act Susp (Fluticasone propionate) .Marland Kitchen.. 1 spray two times a day as needed  Other Orders: Tdap => 77yrs IM (96295) Admin 1st Vaccine (28413) TLB-Calcium (82310-CA)  Patient Instructions: 1)  Avoid excessive hyperextension of neck as discussed. Nasal spray two times a day as needed for Eustachian Tube Dysfunction.Avoid foods high in acid (tomatoes, citrus juices, spicy foods). Avoid eating within two hours of lying down or before exercising. Do not over eat; try smaller more frequent meals. Elevate head of bed twelve inches when sleeping. Prescriptions: FLUTICASONE PROPIONATE 50 MCG/ACT SUSP (FLUTICASONE PROPIONATE) 1 spray two times a day as needed  #1 x 3   Entered and Authorized by:   Marga Melnick MD   Signed by:   Marga Melnick MD on 12/14/2009   Method used:   Faxed to ...       [facility]. [phone]* (retail)       [phone] [address]       [place], [place]  [address]       Ph: [phone]       Fax: [phone]    RxID:   [id number]    Immunizations Administered:  Tetanus Vaccine:    Vaccine Type: Tdap    Site: right deltoid    Mfr: GlaxoSmithKline    Dose: 0.5 ml    Route: IM    Given by: Shonna Chock CMA    Exp. Date: 08/26/2011    Lot #: [id number]    VIS given: 04/21/07 version given December 14, 2009.

## 2010-07-03 NOTE — Progress Notes (Signed)
Summary: supportive dx code  Phone Note From Other Clinic   Caller: [facility] Summary of Call: [facility] left VM that dx code use on 12-07-08 for NMR is not a supportive dx code use by medicare. Please submit a new supportive code so that insurance company will pay claim. Please contact [phone]. Will faxed over document as well indicating a new supportive code today.....................Marland KitchenFelecia Deloach CMA  May 16, 2009 11:05 AM   Follow-up for Phone Call        codes are V17.3, 272.4 Follow-up by: Marga Melnick MD,  May 16, 2009 5:51 PM  Additional Follow-up for Phone Call Additional follow up Details #1::        left message to call office liposcience (angie)...........Marland KitchenFelecia Deloach CMA  May 17, 2009 8:25 AM     Additional Follow-up for Phone Call Additional follow up Details #2::    ANGIE return call informed her of new dx codes................Marland KitchenFelecia Deloach CMA  May 17, 2009 8:30 AM

## 2010-07-03 NOTE — Progress Notes (Signed)
Summary: Lab Results  Phone Note Outgoing Call Call back at [facility] Phone [phone]   Call placed by: Shonna Chock,  March 13, 2009 2:26 PM Call placed to: Patient Summary of Call: Patient called requesting lab result, d/w patient:  These are great reports; LDL goal = < 115. Your  long term cardiovascular  risk will be < 5%, which is ideal. If Pravastatin were affecting the GI tract ,your liver enzymes (AST,ALT ) would be elevated.I believe your symptoms are classic for reflux. I'd wait to see how these symptoms respond to the Nexium Rxed by Dr Jarold Motto. Hopp  Patient stopped Pravastatin last Friday and over the weekend her stomach was fine.  Patient said she will try Pravastatin again and if symptoms redevelop she will call./Chrae [facility]  March 13, 2009 2:31 PM

## 2010-07-03 NOTE — Letter (Signed)
Summary: Results Follow up Letter  [facility] [place]  [address] [place], [place] [address]   Phone: [phone]  Fax: [phone]    03/13/2009 MRN: [id number]  Felicia [name] [address] [place], [place]  [address]  Dear Ms. Presley,  The following are the results of your recent test(s):  Test         Result    Pap Smear:        Normal _____  Not Normal _____ Comments: ______________________________________________________ Cholesterol: LDL(Bad cholesterol):         Your goal is less than:         HDL (Good cholesterol):       Your goal is more than: Comments:  ______________________________________________________ Mammogram:        Normal _____  Not Normal _____ Comments:  ___________________________________________________________________ Hemoccult:        Normal _____  Not normal _______ Comments:    _____________________________________________________________________ Other Tests: Please see attached labs done on 03/10/2009    We routinely do not discuss normal results over the telephone.  If you desire a copy of the results, or you have any questions about this information we can discuss them at your next office visit.   Sincerely,

## 2010-07-03 NOTE — Procedures (Signed)
Summary: manometry   Esophageal Manometry  Procedure date:  02/19/2010  Findings:      normal:   Esophageal manometry:  #1 lower esophageal sphincter-mean pressure is normal at 23 mmHg with normal relaxation of swallowing  #2 upper esophageal sphincter-there is normal coordination between pharyngeal contraction and cricopharyngeal relaxation.  #3 motility pattern-they're normally propagated peristaltic waves throughout the length of esophagus to both wet and dry swallows. Mean amplitude of contractions is 103 mmHg. It is 100% peristalsis.  Assessment this a normal esophageal manometry without evidence of a motility disorder or lower esophageal sphincter incompetence.

## 2010-07-03 NOTE — Progress Notes (Signed)
Summary: med ?'s  Phone Note Call from Patient Call back at [facility] Phone [phone]   Caller: Patient Call For: Dr. Jarold Motto Reason for Call: Talk to Nurse Summary of Call: questions regarding Nexium rx Initial call taken by: Vallarie Mare,  January 30, 2010 3:59 PM  Follow-up for Phone Call        Answered pt's questions re Nexium and manometry proc.  Follow-up by: Ashok Cordia RN,  January 31, 2010 8:55 AM

## 2010-07-03 NOTE — Progress Notes (Signed)
Summary: still no better  Phone Note Call from Patient   Caller: Patient Summary of Call: pt states that she has taken all med and is still using nasal spray but it still feels as if her ear is closing up. pt also wants to know is she still to d/c RA shots (enbrel) and if so when can she start back on this med..............Marland KitchenFelecia Deloach CMA  July 10, 2009 3:43 PM   Follow-up for Phone Call        OK to resume Embrel unless having fever; prednisone 20 mg two times a day with food #14. ENT consult if no better Follow-up by: Marga Melnick MD,  July 10, 2009 5:04 PM  Additional Follow-up for Phone Call Additional follow up Details #1::        pt aware rx sent to pharmacy and to call if no better..............Marland KitchenFelecia Deloach CMA  July 10, 2009 5:30 PM     New/Updated Medications: PREDNISONE 20 MG TABS (PREDNISONE) take two times a day with food Prescriptions: PREDNISONE 20 MG TABS (PREDNISONE) take two times a day with food  #14 x 0   Entered by:   Jeremy Johann CMA   Authorized by:   Marga Melnick MD   Signed by:   Jeremy Johann CMA on 07/10/2009   Method used:   Faxed to ...       [facility]. [phone]* (retail)       [phone] [address]       [place], [place]  [address]       Ph: [phone]       Fax: [phone]   RxID:   [id number]

## 2010-07-03 NOTE — Progress Notes (Signed)
Summary: patient has followup question  Phone Note Call from Patient Call back at Home Phone [phone]   Caller: Patient Summary of Call: patient says--based on results from Dr Maris Berger office---does Dr Alwyn Ren want to see her again or does he want to send her to ENT  --she has seen Dr Pollyann Kennedy and likes him, but she will see anyone that Dr Alwyn Ren recommends  This is regarding the "throat issue that Dr Jarold Motto says (she ) has"  please call [phone] Initial call taken by: Jerolyn Shin,  April 10, 2010 10:41 AM  Follow-up for Phone Call        Studies were normal. Please advise. Follow-up by: Lucious Groves CMA,  April 10, 2010 10:59 AM  Additional Follow-up for Phone Call Additional follow up Details #1::        PATIENT HAS BEEN REFERRED TO DR. Sabino Gasser ENT, I WILL CONTACT PATIENT W/APPT INFO. Additional Follow-up by: Magdalen Spatz [facility],  April 10, 2010 3:06 PM

## 2010-07-03 NOTE — Progress Notes (Signed)
Summary: proceed w/ ENT referral   Phone Note Call from Patient Call back at Home Phone [phone]   Caller: Patient Summary of Call: spoke w/ patient has completed prednisone and still w/ ear and sinus pressuer would like to go ahead w/ ENT referral. Informed patient we would go ahead and set up appt and she will be contacted about appt. Initial call taken by: [name] Devoid,  July 21, 2009 11:16 AM

## 2010-07-03 NOTE — Letter (Signed)
Summary: [facility]  [facility]   Imported By: Lanelle Bal 12/13/2009 09:41:55  _____________________________________________________________________  External Attachment:    Type:   Image     Comment:   External Document

## 2010-07-03 NOTE — Letter (Signed)
Summary: MAMMOGRAM  MAMMOGRAM   Imported By: [name] Devoid 03/12/2007 16:11:58  _____________________________________________________________________  External Attachment:    Type:   Image     Comment:   External Document

## 2010-07-03 NOTE — Procedures (Signed)
Summary: Endoscopy   EGD  Procedure date:  02/24/2009  Findings:      Location: [facility]   ENDOSCOPY PROCEDURE REPORT  PATIENT:  Felicia, [name]  MR#:  [id number] BIRTHDATE:   1941-04-07, 68 yrs. old   GENDER:   female  ENDOSCOPIST:   Vania Rea. Jarold Motto, MD, [facility] Referred by:   PROCEDURE DATE:  02/24/2009 PROCEDURE:  EGD with biopsy ASA CLASS:   Class II INDICATIONS: GERD, chest pain   MEDICATIONS:    Fentanyl 75 mcg IV, Versed 7 mg IV TOPICAL ANESTHETIC:   Exactacain Spray  DESCRIPTION OF PROCEDURE:   After the risks benefits and alternatives of the procedure were thoroughly explained, informed consent was obtained.  The [facility] [id number] endoscope was introduced through the mouth and advanced to the second portion of the duodenum, without limitations.  The instrument was slowly withdrawn as the mucosa was fully examined. <<PROCEDUREIMAGES>>      <<OLD IMAGES>>  The esophagus and gastroesophageal junction were completely normal in appearance. biopsies done.  Mild gastritis was found in the body and the antrum of the stomach. clo bx. done.R/O H.PYLORI.  Normal duodenal folds were noted.  A hiatal hernia was found. SMALL 2CM. HH NOTED.    Retroflexed views revealed no abnormalities.    The scope was then withdrawn from the patient and the procedure completed.  COMPLICATIONS:   None  ENDOSCOPIC IMPRESSION:  1) Normal esophagus  2) Mild gastritis in the body and the antrum of the stomach  3) Normal duodenal folds  4) Hiatal hernia  R/O EOSINOPHILIC ESOPHAGITIS VS REFRACTORY GERD, RECOMMENDATIONS:  1) await biopsy results  2) continue current meds  REPEAT EXAM:   No   _______________________________ Vania Rea. Jarold Motto, MD, [name]    CC: Pecola Lawless, MD        REPORT OF SURGICAL PATHOLOGY   Case #: [id number] Patient Name: Felicia [name], Felicia [name]. Office Chart Number:  N/A MRN: [id number] Pathologist: Ferd Hibbs. Colonel Bald, MD DOB/Age  [birth year]  (Age: 62)    Gender: F Date Taken:  02/24/2009 Date Received: 02/27/2009   FINAL DIAGNOSIS   ***MICROSCOPIC EXAMINATION AND DIAGNOSIS***   ESOPHAGUS, BIOPSY:   - SUPERFICIAL FRAGMENTS OF BENIGN SQUAMOUS MUCOSA. - THERE IS NO EVIDENCE OF EOSINOPHILIC ESOPHAGITIS, INTESTINAL METAPLASIA, DYSPLASIA,  OR MALIGNANCY.  - SEE COMMENT.   COMMENT An Alcian Blue stain is performed to determine the presence of intestinal metaplasia (goblet cell metaplasia). No intestinal metaplasia (goblet cell metaplasia) is identified with the Alcian Blue stain. The control stained appropriately.  A PAS fungal stain is negative for the presence of fungal organisms. (JBK:mj 02/28/09)   mj Date Reported:  02/28/2009     Ferd Hibbs. Colonel Bald, MD *** Electronically Signed Out By JBK ***       February 28, 2009 MRN: [id number]    Felicia [name] [address] [place], [place]  [address]    Dear Ms. Timmers,  I am pleased to inform you that the biopsies taken during your recent endoscopic examination did not show any evidence of cancer upon pathologic examination. All biopsies were unremarkable and there was no pathologic evidence of H. Pylori infection.  Additional information/recommendations:  __No further action is needed at this time.  Please follow-up with      your primary care physician for your other healthcare needs.  __ Please call [phone] to schedule a return visit to review      your condition.  xx__ Continue with the treatment plan as  outlined on the day of your      exam.  __ You should have a repeat endoscopic examination for this problem              in _ months/years.   Please call us if you are having persistent problems or have questions about your condition that have not been fully answered at this time.  Sincerely,  Mardella Layman MD [facility]  This letter has been electronically signed by your physician.

## 2010-07-03 NOTE — Consult Note (Signed)
Summary: [facility]  [facility]   Imported By: Lanelle Bal 08/03/2009 08:40:36  _____________________________________________________________________  External Attachment:    Type:   Image     Comment:   External Document

## 2010-07-03 NOTE — Medication Information (Signed)
Summary: Approved/medco  Approved/medco   Imported By: Lester [place] 03/27/2010 09:51:04  _____________________________________________________________________  External Attachment:    Type:   Image     Comment:   External Document

## 2010-07-03 NOTE — Progress Notes (Signed)
Summary: Letter Brought by Patient Regarding Symptoms  Letter Brought by Patient Regarding Symptoms   Imported By: Lanelle Bal 03/21/2009 08:49:48  _____________________________________________________________________  External Attachment:    Type:   Image     Comment:   External Document

## 2010-07-03 NOTE — Progress Notes (Signed)
Summary: results request  Phone Note Call from Patient Call back at Home Phone [phone]   Caller: Patient Call For: Dr. Jarold Motto Reason for Call: Talk to Nurse Summary of Call: would like manometry results Initial call taken by: Vallarie Mare,  March 19, 2010 11:19 AM  Follow-up for Phone Call        pt aware and she will call Dr. Caryl Never office. Follow-up by: Harlow Mares CMA Duncan Dull),  March 19, 2010 11:29 AM

## 2010-07-03 NOTE — Assessment & Plan Note (Signed)
Summary: pneumonia vac/cbs  Nurse Visit    Prior Medications: HUMIRA PEN ()  METHATREXATE () as directed PREMARIN () once daily PROMETRIUM () as directed MULTIVITAMIN ()  VIT D AND CALTRATE ()  FOLIC ACID ()  ASA 81MG  ()  ISTADOL EYE QTTS ()  ALPHAGAN EYE GTTS ()  XALATAN EYE GTTS ()  PREDNISOLONE EYE GTTS ()  GINGKO BILOBA ()  FE OTC ()  AMOXIL 500 MG  CAPS (AMOXICILLIN) 1 tid Current Allergies: ! SULFA   Pneumovax Vaccine    Vaccine Type: Pneumovax    Site: right deltoid    Mfr: Merck    Dose: 0.5 ml    Route: IM    Given by: Floydene Flock CMA    Exp. Date: 05/05/2009    Lot #: [id number]   Orders Added: 1)  Pneumococcal Vaccine [90732] 2)  Admin 1st Vaccine Mishka.Peer    ]

## 2010-07-03 NOTE — Procedures (Signed)
Summary: ph probe     pH Probe Study  Procedure date:  02/18/2010  Findings:      Bravo  pH Probe Study  Procedure date:  02/18/2010  Findings:      Location: [facility]  Transnasal:   This patient had a standardTransnasal 24-hour pH probe test on February 19, 2010. There is no evidence of acid reflux in the upright or recumbent position. Total DeMeester score is 3.8 normal less than 22. There are no reflux episodes longer than 5 minutes, and the percentage of time the pH is less than 4 is normal in all positions. There also is no coordination between her symptoms and reflux episodes.  Assessment this is a normal 24 pH probe test and I do not think this patient has significant acid reflux contributing to her unusual symptomatology. It was suggested she followup with her primary care physician.

## 2010-07-03 NOTE — Progress Notes (Signed)
Summary: Triage  Phone Note Call from Patient Call back at Home Phone [phone] Call back at [phone]   Caller: Patient Call For: Dr. Jarold Motto Reason for Call: Acute Illness Details for Reason: Problems w/catching in throat and feeling sick. Summary of Call: problems w food catching in throat and feeling sick. Feels like throat is closing and has to keep clearing throat. Burning in tongue and throat. Please advise. Initial call taken by: Zackery Barefoot,  March 30, 2009 9:15 AM  Follow-up for Phone Call        Pt cont's to have symptoms of feeling that something is in her throat.  Feels that she needs to clear throat all the time.  At times feels that she has trouble swallowing. Does not want to eat solid food at times because of the way her throat feels.   At night throat hurts and tonque burns.  Rt ear also hurts and feels like it closes up at night.  Taking Nexium each morning.  (insurance would not cover dexilant and it did not help symptoms either)   Pt left off pravastatin for a few days and felt better but PCP told her that she should resume it because it should have no effect on throat/esophagus.  Pt aksing what she can do now?  See Endo report. Follow-up by: Ashok Cordia RN,  March 30, 2009 9:48 AM  Additional Follow-up for Phone Call Additional follow up Details #1::        WOULD INCREASE THE NEXIUM TO 40 MG TWICE DAILY,  TAKE THE SECOND DOSE BEFORE HER EVENING MEAL,ELEVATE THE HEAD OF BED. I THINK SHE SHOULD HOLD THE PRAVACHOL AS IT CAN CAUSE DYSPEPSIA,ETC. AND ASK PT TO LET DR HOPPER KNOW WE WANT TO HOLD IT AS A TRIAL FOR A FEW WEEKS AND SEE IF SXS RESOLVE Additional Follow-up by: Peterson Ao,  March 30, 2009 10:32 AM     Appended Document: Triage Pt notified.  She will report back.

## 2010-07-03 NOTE — Letter (Signed)
Summary: [facility]  [facility]   Imported By: Lanelle Bal 07/18/2009 13:52:39  _____________________________________________________________________  External Attachment:    Type:   Image     Comment:   External Document

## 2010-07-03 NOTE — Letter (Signed)
Summary: [facility]  [facility]   Imported By: Lanelle Bal 09/30/2008 10:08:17  _____________________________________________________________________  External Attachment:    Type:   Image     Comment:   External Document

## 2010-07-03 NOTE — Assessment & Plan Note (Signed)
Summary: SINUS INFECTION/CDJ   Vital Signs:  Patient profile:   70 year old female Weight:      140.0 pounds Temp:     98.6 degrees F oral Pulse rate:   64 / minute BP sitting:   118 / 70  (left arm) Cuff size:   large  Vitals Entered By: Shonna Chock (April 04, 2009 11:59 AM) CC: ? Sinus Infection: ear pain,facial pain,sore throat since last Wed Comments REVIEWED MED LIST, PATIENT AGREED DOSE AND INSTRUCTION CORRECT    Primary Care Provider:  Marga Melnick, MD  CC:  ? Sinus Infection: ear pain, facial pain, and sore throat since last Wed.  History of Present Illness: Onset 6 days ago as facial pain ,ST,frontal  headache & maxillary & mandibular  dental pain . Glossitis for several months , ? from ERD. Endo & path reviewed : ERD, refractory.Insurance required Nexium but symptoms persist.Triggers reviewed ; not on bisphosphonate. Nexium two times a day ; statin held 1 week as per GI . On Embrel & MTX for RA.  Allergies: 1)  ! Sulfa  Review of Systems General:  Denies chills, fever, and sweats. ENT:  Complains of nasal congestion and sinus pressure; denies ear discharge; Pressure R ear; claear drainage. Laryngitis 10/29.Marland Kitchen Resp:  Complains of cough; denies sputum productive. Allergy:  Complains of sneezing; denies itching eyes.  Physical Exam  General:  in no acute distress; alert,appropriate and cooperative throughout examination Eyes:  No corneal or conjunctival inflammation noted. Asymmetry of pupils. No icterus Ears:  External ear exam shows no significant lesions or deformities.  Otoscopic examination reveals clear canals, tympanic membranes are intact bilaterally without bulging, retraction, inflammation or discharge. Hearing is grossly normal bilaterally. TMs slightly dull Nose:  External nasal examination shows no deformity or inflammation. Nasal mucosa are pink and moist without lesions or exudates. Slight hyponasal speech Mouth:  Oral mucosa and oropharynx without  lesions or exudates.  Teeth in good repair.Tongue coated Lungs:  Normal respiratory effort, chest expands symmetrically. Lungs are clear to auscultation, no crackles or wheezes. Heart:  Normal rate and regular rhythm. S1 and S2 normal without gallop, murmur, click, rub . Abdomen:  Bowel sounds positive,abdomen soft and non-tender without masses, organomegaly or hernias noted. Skin:  No jaundice Cervical Nodes:  No lymphadenopathy noted Axillary Nodes:  No palpable lymphadenopathy   Impression & Recommendations:  Problem # 1:  SINUSITIS- ACUTE-NOS (ICD-461.9)  Her updated medication list for this problem includes:    Amoxicillin 500 Mg Caps (Amoxicillin) .Marland Kitchen... 1 three times a day  Problem # 2:  GERD (ICD-530.81) Refractory The following medications were removed from the medication list:    Dexilant 60 Mg Cpdr (Dexlansoprazole) .Marland Kitchen... Take 30 min before 1st meal of the day Her updated medication list for this problem includes:    Nexium 40 Mg Cpdr (Esomeprazole magnesium) .Marland Kitchen... 1 by mouth two times a day  Problem # 3:  RHEUMATOID ARTHRITIS (ICD-714.0) on immunosuppressive therapy  Problem # 4:  GLOSSITIS (ICD-529.0) R/o vit deficiency  Problem # 5:  HYPERLIPIDEMIA (ICD-272.4)  Her updated medication list for this problem includes:    Pravastatin Sodium 20 Mg Tabs (Pravastatin sodium) .Marland Kitchen... 1 at bedtime as of 04/04/2009, holding x 1-2 weeks per dr.pattersons pa  Complete Medication List: 1)  Methatrexate  .... As directed 2)  Premarin 0.625 Mg Tabs (Estrogens conjugated) .Marland Kitchen.. 1 by mouth once daily 3)  Prometrium 100 Mg Caps (Progesterone micronized) .Marland Kitchen.. 1 by mouth once daily 4)  Multivitamin  .Marland Kitchen.. 1 tablet by mouth once daily 5)  Vit D and Caltrate  .Marland Kitchen.. 1 tablet by mouth once daily 6)  Folic Acid 1 Mg Tabs (Folic acid) .... 2 by mouth once daily 7)  Bayer Low Strength 81 Mg Tbec (Aspirin) .Marland Kitchen.. 1 by mouth once daily 8)  Istalol 0.5 % Soln (Timolol maleate) .... Apply 1 drop  daily (am) 9)  Alphagan P 0.15 % Soln (Brimonidine tartrate) .Marland Kitchen.. 1 drop two times a day 10)  Xalatan 0.005 % Soln (Latanoprost) .... Daily 11)  Prednisolone Acetate 1 % Susp (Prednisolone acetate) .... As directed 12)  Omega 3 Krill  .Marland Kitchen.. 1 by mouth once daily 13)  Pravastatin Sodium 20 Mg Tabs (Pravastatin sodium) .Marland Kitchen.. 1 at bedtime as of 04/04/2009, holding x 1-2 weeks per dr.pattersons pa 14)  Ambien 10 Mg Tabs (Zolpidem tartrate) .Marland Kitchen.. 1 tablet by mouth at bedtime as needed 15)  Enbrel 50 Mg/ml Soln (Etanercept) .Marland Kitchen.. 1 injection weekly 16)  Methotrexate Sodium 250 Mg/66ml Soln (Methotrexate sodium) .... 0.8 mg once weekly 17)  Align Caps (Misc intestinal flora regulat) .... Take one capsule by mouth daily 18)  Nexium 40 Mg Cpdr (Esomeprazole magnesium) .Marland Kitchen.. 1 by mouth two times a day 19)  Amoxicillin 500 Mg Caps (Amoxicillin) .Marland Kitchen.. 1 three times a day  Patient Instructions: 1)  Fasting labs needed : hepatic panel, CBC & dif, B12 level (Codes 995.20,529.0) in addition to any needed by Dr Corliss Skains. 2)  Drink as much fluid as you can tolerate for the next few days. Use nasal spray sample two times a day (nasonex) 3)  Avoid foods high in acid (tomatoes, citrus juices, spicy foods). Avoid eating within two hours of lying down or before exercising. Do not over eat; try smaller more frequent meals. Elevate head of bed twelve inches when sleeping. Prescriptions: AMOXICILLIN 500 MG CAPS (AMOXICILLIN) 1 three times a day  #30 x 0   Entered and Authorized by:   Marga Melnick MD   Signed by:   Marga Melnick MD on 04/04/2009   Method used:   Faxed to ...       [facility]. [phone]* (retail)       [phone] [address].       [place], [place]  [address]       Ph: [phone]       Fax: [phone]   RxID:   [id number]

## 2010-07-03 NOTE — Letter (Signed)
Summary: [facility]  [facility]   Imported By: Lanelle Bal 05/04/2010 11:51:50  _____________________________________________________________________  External Attachment:    Type:   Image     Comment:   External Document

## 2010-07-03 NOTE — Assessment & Plan Note (Signed)
Summary: reflux symptoms...ad   History of Present Illness Visit Type: Follow-up Visit Primary GI MD: Sheryn Bison MD FACP FAGA Primary Provider: Marga Melnick, MD Chief Complaint: Patient complains of constant acid reflux which she says the Nexium helps but does not resolve. She states that she has some epigastric pain and burning in her esophagus. She has changed her diet and would ike to know if there is anything eles she can do help resolve her reflux.  History of Present Illness:   70 year old Caucasian female with rheumatoid disease managed with weekly Enbrel And Methotrexate per Dr.Devischer in rheumatology. She does not take frequent NSAIDs. She also denies any history of Raynaud's phenomenon or other symptoms of collagen vascular disease.  I've seen her for several years because of atypical acid reflux symptoms with hoarseness, coughing, throat clearing, and globus sensation. She has been on PPI therapy 1-2 times a day for many years and continues with her ENT problems. She has had ENT consultation by Dr. Pollyann Kennedy which otherwise has been unremarkable. She denies true dysphagia , regurgitation or burning substernal chest pain or any hepatobiliary complaints. Her appetite is good and her weight is stable. Last endoscopy and ultrasound were in 2007.   GI Review of Systems    Reports acid reflux.      Denies abdominal pain, belching, bloating, chest pain, dysphagia with liquids, dysphagia with solids, heartburn, loss of appetite, nausea, vomiting, vomiting blood, weight loss, and  weight gain.        Denies anal fissure, black tarry stools, change in bowel habit, constipation, diarrhea, diverticulosis, fecal incontinence, heme positive stool, hemorrhoids, irritable bowel syndrome, jaundice, light color stool, liver problems, rectal bleeding, and  rectal pain. Preventive Screening-Counseling & Management  Alcohol-Tobacco     Smoking Status: quit      Drug Use:  no.      Current  Medications (verified): 1)  Premarin 0.625 Mg Tabs (Estrogens Conjugated) .Marland Kitchen.. 1 By Mouth Once Daily 2)  Prometrium 100 Mg Caps (Progesterone Micronized) .Marland Kitchen.. 1 By Mouth Once Daily 3)  Multivitamin .Marland Kitchen.. 1 Tablet By Mouth Once Daily 4)  Vit D and Caltrate .Marland Kitchen.. 1 Tablet By Mouth Once Daily 5)  Bayer Low Strength 81 Mg Tbec (Aspirin) .Marland Kitchen.. 1 By Mouth Once Daily 6)  Istalol 0.5 % Soln (Timolol Maleate) .... Apply 1 Drop Daily (Am) (Both Eyes) 7)  Alphagan P 0.15 % Soln (Brimonidine Tartrate) .Marland Kitchen.. 1 Dropp in Left Eye Three Times A Day 8)  Xalatan 0.005 % Soln (Latanoprost) .Marland Kitchen.. 1 Drop Daily in Left Eye 9)  Prednisolone Acetate 1 % Susp (Prednisolone Acetate) .... As Directed 10)  Omega 3 Krill .Marland Kitchen.. 1 By Mouth Once Daily 11)  Ambien 10 Mg Tabs (Zolpidem Tartrate) .Marland Kitchen.. 1 Tablet By Mouth At Bedtime As Needed 12)  Enbrel 50 Mg/ml Soln (Etanercept) .Marland Kitchen.. 1 Injection Weekly 13)  Methotrexate Sodium 25 Mg/ml Soln (Methotrexate Sodium) .... Once Weekly 14)  Align   Caps (Misc Intestinal Flora Regulat) .... Take One Capsule By Mouth Daily 15)  Nexium 40 Mg  Cpdr (Esomeprazole Magnesium) .Marland Kitchen.. 1 Capsule Each Day 30 Minutes Before Meal 16)  Pilocarpine Hcl 0.5 % Soln (Pilocarpine Hcl) .... 4 Times Daily 17)  Fluticasone Propionate 50 Mcg/act Susp (Fluticasone Propionate) .Marland Kitchen.. 1 Spray Two Times A Day As Needed  Allergies (verified): 1)  ! Sulfa  Past History:  Past medical, surgical, family and social histories (including risk factors) reviewed for relevance to current acute and chronic problems.  Past Medical  History: Reviewed history from 12/14/2009 and no changes required. Hyperlipidemia:NMR  2005: LDL 120(1234/729),HDL 59, TG 96. LDL goal = <135. Glaucoma Colonic polyps X 1,   PMH  of 2004, negative  2007 Rheumatoid Arthritis, Dr Corliss Skains GERD  Past Surgical History: Reviewed history from 12/14/2009 and no changes required. D&C X3; Glaucoma surgery X 2 @ [facility] (Iridectomy), Dr Nedra Hai Cataract  extraction OD Colon polypectomy 2004(adenoma);  negative   2007, Dr  Jarold Motto; Endoscopy 2010 : gastritis/GERD  Family History: Reviewed history from 12/14/2009 and no changes required. Father: MI @ 53, aortic  valve replacement Mother: COPD, AV Stenosis, Dementia Siblings: brother:UC, ? glaucoma; PGM: pancreatic cancer;MGM CVA;MGF: COPD No FH of Colon Cancer: Family History of Colitis: brother had UC  Social History: Reviewed history from 12/14/2009 and no changes required. Retired Patient is a former smoker. quit in 1971 Alcohol Use - no Daily Caffeine Use 2 cups of coffee per day and herbal tea Illicit Drug Use - no Smoking Status:  quit Drug Use:  no  Review of Systems       The patient complains of arthritis/joint pain, cough, and sore throat.  The patient denies allergy/sinus, anemia, anxiety-new, back pain, blood in urine, breast changes/lumps, change in vision, confusion, coughing up blood, depression-new, fainting, fatigue, fever, headaches-new, hearing problems, heart murmur, heart rhythm changes, itching, menstrual pain, muscle pains/cramps, night sweats, nosebleeds, pregnancy symptoms, shortness of breath, skin rash, sleeping problems, swelling of feet/legs, swollen lymph glands, thirst - excessive , urination - excessive , urination changes/pain, urine leakage, vision changes, and voice change.   General:  Complains of fatigue; denies fever, chills, sweats, anorexia, weakness, malaise, weight loss, and sleep disorder. Eyes:  Denies blurring, diplopia, irritation, discharge, vision loss, scotoma, eye pain, and photophobia; history of glaucoma.. ENT:  Complains of earache and hoarseness; denies ear discharge, tinnitus, decreased hearing, nasal congestion, loss of smell, nosebleeds, sore throat, and difficulty swallowing. CV:  Denies chest pains, angina, palpitations, syncope, dyspnea on exertion, orthopnea, PND, peripheral edema, and claudication. Resp:  Denies dyspnea at  rest, dyspnea with exercise, cough, sputum, wheezing, coughing up blood, and pleurisy. GI:  Denies difficulty swallowing, pain on swallowing, nausea, indigestion/heartburn, vomiting, vomiting blood, abdominal pain, jaundice, gas/bloating, diarrhea, constipation, change in bowel habits, bloody BM's, black BMs, and fecal incontinence. MS:  Complains of joint swelling, joint stiffness, and joint deformity; denies joint pain / LOM, low back pain, muscle weakness, muscle cramps, muscle atrophy, leg pain at night, leg pain with exertion, and shoulder pain / LOM hand / wrist pain (CTS). Derm:  Denies rash, itching, dry skin, hives, moles, warts, and unhealing ulcers. Neuro:  Denies weakness, paralysis, abnormal sensation, seizures, syncope, tremors, vertigo, transient blindness, frequent falls, frequent headaches, difficulty walking, headache, sciatica, radiculopathy other:, restless legs, memory loss, and confusion. Psych:  Denies depression, anxiety, memory loss, suicidal ideation, hallucinations, paranoia, phobia, and confusion. Endo:  Denies cold intolerance, heat intolerance, polydipsia, polyphagia, polyuria, unusual weight change, and hirsutism. Heme:  Denies bruising, bleeding, enlarged lymph nodes, and pagophagia.  Vital Signs:  Patient profile:   70 year old female Height:      66 inches Weight:      143.0 pounds BMI:     23.16 Pulse rate:   64 / minute Pulse rhythm:   regular BP sitting:   118 / 76  (left arm) Cuff size:   regular  Vitals Entered By: Harlow Mares CMA Duncan Dull) (January 30, 2010 11:34 AM)  Physical Exam  General:  Well developed,  well nourished, no acute distress.healthy appearing.   Head:  Normocephalic and atraumatic. Eyes:  PERRLA, no icterus.exam deferred to patient's ophthalmologist.   Mouth:  No deformity or lesions, dentition normal. Neck:  Supple; no masses or thyromegaly. Lungs:  Clear throughout to auscultation. Heart:  Regular rate and rhythm; no murmurs,  rubs,  or bruits. Abdomen:  Soft, nontender and nondistended. No masses, hepatosplenomegaly or hernias noted. Normal bowel sounds. Neurologic:  Alert and  oriented x4;  grossly normal neurologically. Psych:  Alert and cooperative. Normal mood and affect.   Impression & Recommendations:  Problem # 1:  HOARSENESS, CHRONIC (ICD-784.42) Assessment Unchanged Probable extra esophageal manifestation of acid reflux with possible underlying esophageal motility disorder. I will schedule her for esophageal manometry and 24-hour pH probe testing off of medical therapy. In the interim I have reviewed a reflux regime with her and we'll increase her Nexium to 40 mg twice a day 30 minutes before breakfast and supper. Previous endoscopy did not show a large hiatal hernia. Previous ultrasound showed no evidence of cholelithiasis. Also in her history she does not have Sjogren's syndrome, or Raynaud's phenomenon.  Problem # 2:  RHEUMATOID ARTHRITIS (ICD-714.0) Assessment: Improved Continue Medication per Rheumatology.  Patient Instructions: 1)  Copy sent to : Dr. Marga Melnick and Dr.Devenshir in rheumatology 2)  Please continue current medications.  3)  GI Reflux brochure given.  4)  Avoid foods high in acid content ( tomatoes, citrus juices, spicy foods) . Avoid eating within 3 to 4 hours of lying down or before exercising. Do not over eat; try smaller more frequent meals. Elevate head of bed four inches when sleeping.  5)  outpatient manometry and 24-hour pH probe testing off therapy. In the interim increase Nexium to 40 mg twice a day 30 minutes before meals.  Appended Document: reflux symptoms...ad    Clinical Lists Changes  Medications: Rx of NEXIUM 40 MG  CPDR (ESOMEPRAZOLE MAGNESIUM) 1 by mouth two times a day;  #60 x 6;  Signed;  Entered by: Ashok Cordia RN;  Authorized by: Mardella Layman MD [facility];  Method used: Electronically to [id number]*, [address]  [place], [place], [place]  [address], Ph: [phone], Fax: [phone] Orders: Added new Test order of Mano/24 hour pH Probe (Mano/24 pH) - Signed    Prescriptions: NEXIUM 40 MG  CPDR (ESOMEPRAZOLE MAGNESIUM) 1 by mouth two times a day  #60 x 6   Entered by:   Ashok Cordia RN   Authorized by:   Mardella Layman MD [facility]   Signed by:   Ashok Cordia RN on 01/30/2010   Method used:   Electronically to        [facility] [phone]* (retail)       [phone] [address]       [place], [place]  [address]       Ph: [phone]       Fax: [phone]   RxID:   [id number]

## 2010-07-03 NOTE — Assessment & Plan Note (Signed)
Summary: FLU SHOTCYD  Nurse Visit    Prior Medications: HUMIRA PEN ()  METHATREXATE () as directed PREMARIN () once daily PROMETRIUM () as directed MULTIVITAMIN ()  VIT D AND CALTRATE ()  FOLIC ACID ()  ASA 81MG  ()  ISTADOL EYE QTTS ()  ALPHAGAN EYE GTTS ()  XALATAN EYE GTTS ()  PREDNISOLONE EYE GTTS ()  GINGKO BILOBA ()  FE OTC ()  AMOXIL 500 MG  CAPS (AMOXICILLIN) 1 tid Current Allergies: ! SULFA   Influenza Vaccine    Vaccine Type: Fluvax MCR    Site: left deltoid    Mfr: GlaxoSmithKline    Dose: 0.5 ml    Route: IM    Given by: [name] Devoid    Exp. Date: 11/30/2008    Lot #: [id number]   Orders Added: 1)  Influenza Vaccine MCR [00025]    ]

## 2010-07-03 NOTE — Letter (Signed)
Summary: BONE DENSITY  BONE DENSITY   Imported By: [name] Devoid 03/12/2007 15:06:22  _____________________________________________________________________  External Attachment:    Type:   Image     Comment:   External Document

## 2010-07-03 NOTE — Assessment & Plan Note (Signed)
Summary: yearly check & lab/cbs   Vital Signs:  Patient profile:   70 year old female Height:      65 inches Weight:      143.0 pounds BMI:     23.88 Temp:     98.3 degrees F oral Pulse rate:   60 / minute Resp:     14 per minute BP sitting:   110 / 64  (left arm) Cuff size:   large  Vitals Entered By: Shonna Chock (December 07, 2008 11:15 AM)   History of Present Illness: Only active active issues are RA & glaucoma. RA flared in hands 6-9/09; on  MTX &Embrel( since 7/09 ) & PT as needed. Humira has had to be intermittent due to glaucoma surgeries.  Preventive Screening-Counseling & Management  Alcohol-Tobacco     Smoking Status: quit  Caffeine-Diet-Exercise     Does Patient Exercise: yes  Allergies: 1)  ! Sulfa  Past History:  Past Medical History: Hyperlipidemia Glaucoma Colonic polyps, hx of 2004, none 2007 Rheumatoid arthritis GERD  Past Surgical History: D&C X3; Glaucoma surgery X 2 @ [facility] (Iridectomy) Cataract extraction OD Colon polypectomy; Endo neg  2007, DR Jarold Motto  Family History: Father: MI @ 63, valve replacement Mother: COPD, AV Stenosis, Dementia Siblings: brother ? glaucoma; PGF pancreatic CA;MGM CVA;MGF COPD  Social History: Retired Alcohol use-yes: Former Smoker (total of 4 years) Regular exercise-yes: Tai Chi, walking every other day 1.5 miles  Smoking Status:  quit Does Patient Exercise:  yes  Review of Systems       The patient complains of vision loss.  The patient denies anorexia, fever, decreased hearing, hoarseness, syncope, dyspnea on exertion, prolonged cough, headaches, hemoptysis, abdominal pain, melena, hematochezia, severe indigestion/heartburn, hematuria, incontinence, suspicious skin lesions, depression, unusual weight change, abnormal bleeding, enlarged lymph nodes, and angioedema.         Weight loss of 4 # with [employer]. Chronic tinnitus. Occa nonexertional L chest "ache". Hands swell but not feet.  Physical Exam  General:   well-nourished,in no acute distress; alert,appropriate and cooperative throughout examination Head:  Normocephalic and atraumatic without obvious abnormalities. No apparent alopecia  Eyes:  No corneal or conjunctival inflammation noted.  Iridectomy op changes OD,Anisocoria OD > OS. Funduscopic exam benign, without hemorrhages, exudates or papilledema.  Ears:  External ear exam shows no significant lesions or deformities.  Otoscopic examination reveals clear canals, tympanic membranes are intact bilaterally without bulging, retraction, inflammation or discharge. Hearing is grossly normal bilaterally. Nose:  External nasal examination shows no deformity or inflammation. Nasal mucosa are pink and moist without lesions or exudates. Mouth:  Oral mucosa and oropharynx without lesions or exudates.  Teeth in good repair. Neck:  No deformities, masses, or tenderness noted. Lungs:  Normal respiratory effort, chest expands symmetrically. Lungs are clear to auscultation, no crackles or wheezes. Heart:  Normal rate and regular rhythm. S1 and S2 normal without gallop, murmur, click, rub. S4  Abdomen:  Bowel sounds positive,abdomen soft but slightly tender  in lower abd without masses, organomegaly or hernias noted. Due to see Gyn  Genitalia:  Dr Henderson Cloud Msk:  No deformity or scoliosis noted of thoracic or lumbar spine.   Pulses:  R and L carotid,radial,dorsalis pedis and posterior tibial pulses are full and equal bilaterally. Aortic bruit w/o AAA Extremities:  No clubbing, cyanosis, edema. Surprisingly minimal  digit deformity noted with normal full range of motion of all joints.   Neurologic:  alert & oriented X3 and DTRs symmetrical and  normal.   Skin:  Intact without suspicious lesions or rashes Cervical Nodes:  No lymphadenopathy noted Axillary Nodes:  No palpable lymphadenopathy Psych:  memory intact for recent and remote, normally interactive, and good eye contact.     Impression & Recommendations:   Problem # 1:  ROUTINE GENERAL MEDICAL EXAM@HEALTH  CARE FACL (ICD-V70.0)  Orders: EKG w/ Interpretation (93000) Venipuncture (16109) TLB-TSH (Thyroid Stimulating Hormone) (84443-TSH) TLB-A1C / Hgb A1C (Glycohemoglobin) (83036-A1C) T- * Misc. Laboratory test [phone]) TLB-Calcium (82310-CA) T-Vitamin D (25-Hydroxy) [phone])  Problem # 2:  RHEUMATOID ARTHRITIS (ICD-714.0) as per Dr Corliss Skains  Problem # 3:  HYPERLIPIDEMIA (ICD-272.4)  Orders: EKG w/ Interpretation (93000) Venipuncture ([address]562) TLB-TSH (Thyroid Stimulating Hormone) (84443-TSH) TLB-A1C / Hgb A1C (Glycohemoglobin) (83036-A1C) T- * Misc. Laboratory test [phone])  Problem # 4:  FAMILY HISTORY OF ISCHEMIC HEART DISEASE (ICD-V17.3)  Orders: Venipuncture (57846) T- * Misc. Laboratory test [phone])  Problem # 5:  HYPERCALCEMIA (ICD-275.42) PMH of Orders: Venipuncture (28413) TLB-Calcium (82310-CA) T-Vitamin D (25-Hydroxy) (24401-02725)  Complete Medication List: 1)  Methatrexate  .... As directed 2)  Premarin 0.625 Mg Tabs (Estrogens conjugated) .Marland Kitchen.. 1 by mouth once daily 3)  Prometrium 100 Mg Caps (Progesterone micronized) .Marland Kitchen.. 1 by mouth once daily 4)  Multivitamin  5)  Vit D and Caltrate  6)  Folic Acid 1 Mg Tabs (Folic acid) .Marland Kitchen.. 1 by mouth once daily 7)  Bayer Low Strength 81 Mg Tbec (Aspirin) .Marland Kitchen.. 1 by mouth once daily 8)  Istalol 0.5 % Soln (Timolol maleate) .... Apply 1 drop daily (am) 9)  Alphagan P 0.15 % Soln (Brimonidine tartrate) .Marland Kitchen.. 1 drop two times a day 10)  Xalatan 0.005 % Soln (Latanoprost) .... Daily 11)  Prednisolone Acetate 1 % Susp (Prednisolone acetate) .... As directed 12)  Omega 3 Krill  .Marland Kitchen.. 1 by mouth once daily 13)  Vit D3 1000  .Marland KitchenMarland Kitchen. 1 by mouth once daily  Patient Instructions: 1)  Correct this report ; share it with all MDs seen

## 2010-07-03 NOTE — Miscellaneous (Signed)
Summary: Flu/[facility]  Flu/[facility]   Imported By: Lanelle Bal 03/24/2010 10:35:54  _____________________________________________________________________  External Attachment:    Type:   Image     Comment:   External Document

## 2010-07-03 NOTE — Assessment & Plan Note (Signed)
Summary: acid reflux/yf   History of Present Illness Visit Type: Initial Visit Primary GI MD: Sheryn Bison MD FACP FAGA Primary Provider: Marga Melnick, MD Chief Complaint: c/o acid reflux and bloating History of Present Illness:   This patient is a 70 year old Caucasian female with severe rheumatoid arthritis on methotrexate and Enbrel. She presents today with pressure in her chest, regurgitation, heartburn, some occasional dysphasia, hoarseness, and some coughing. She is not a management for GERD at this time. She last had endoscopy and colonoscopy in February 2007 and at that time did have a small hiatal hernia and responded well to Nexium therapy for suspected GERD.  She has a lot of abdominal gas and bloating but denies bowel irregularity. She denies any specific food intolerances and does not use sorbitol fructose. She denies hepatobiliary complaints and had a negative abdominal ultrasound exam in February of 2007. She's followed by Dr.Devensher in rheumatology and Dr. Marga Melnick primary care.  She denies anorexia weight loss or systemic complaints this time. She is on aspirin 81 mg a day and 15 other medications. These are listed/reviewed in her chart.   GI Review of Systems    Reports bloating, chest pain, dysphagia with solids, and  heartburn.      Denies abdominal pain, acid reflux, belching, dysphagia with liquids, loss of appetite, nausea, vomiting, vomiting blood, weight loss, and  weight gain.        Denies anal fissure, black tarry stools, change in bowel habit, constipation, diarrhea, diverticulosis, fecal incontinence, heme positive stool, hemorrhoids, irritable bowel syndrome, jaundice, light color stool, liver problems, rectal bleeding, and  rectal pain.    Current Medications (verified): 1)  Methatrexate .... As Directed 2)  Premarin 0.625 Mg Tabs (Estrogens Conjugated) .Marland Kitchen.. 1 By Mouth Once Daily 3)  Prometrium 100 Mg Caps (Progesterone Micronized) .Marland Kitchen.. 1 By  Mouth Once Daily 4)  Multivitamin .Marland Kitchen.. 1 Tablet By Mouth Once Daily 5)  Vit D and Caltrate .Marland Kitchen.. 1 Tablet By Mouth Once Daily 6)  Folic Acid 1 Mg Tabs (Folic Acid) .... 2 By Mouth Once Daily 7)  Bayer Low Strength 81 Mg Tbec (Aspirin) .Marland Kitchen.. 1 By Mouth Once Daily 8)  Istalol 0.5 % Soln (Timolol Maleate) .... Apply 1 Drop Daily (Am) 9)  Alphagan P 0.15 % Soln (Brimonidine Tartrate) .Marland Kitchen.. 1 Drop Two Times A Day 10)  Xalatan 0.005 % Soln (Latanoprost) .... Daily 11)  Prednisolone Acetate 1 % Susp (Prednisolone Acetate) .... As Directed 12)  Omega 3 Krill .Marland Kitchen.. 1 By Mouth Once Daily 13)  Pravastatin Sodium 20 Mg Tabs (Pravastatin Sodium) .Marland Kitchen.. 1 At Bedtime 14)  Ambien 10 Mg Tabs (Zolpidem Tartrate) .Marland Kitchen.. 1 Tablet By Mouth At Bedtime As Needed 15)  Enbrel 50 Mg/ml Soln (Etanercept) .Marland Kitchen.. 1 Injection Weekly 16)  Methotrexate Sodium 250 Mg/66ml Soln (Methotrexate Sodium) .... 0.8 Mg Once Weekly  Allergies (verified): 1)  ! Sulfa  Past History:  Past medical, surgical, family and social histories (including risk factors) reviewed for relevance to current acute and chronic problems.  Past Medical History: Reviewed history from 12/07/2008 and no changes required. Hyperlipidemia Glaucoma Colonic polyps, hx of 2004, none 2007 Rheumatoid arthritis GERD  Past Surgical History: Reviewed history from 12/07/2008 and no changes required. D&C X3; Glaucoma surgery X 2 @ [facility] (Iridectomy) Cataract extraction OD Colon polypectomy; Endo neg  2007, DR Jarold Motto  Family History: Reviewed history from 12/07/2008 and no changes required. Father: MI @ 5, valve replacement Mother: COPD, AV Stenosis, Dementia Siblings: brother ?  glaucoma; PGF pancreatic CA;MGM CVA;MGF COPD Family History of Colitis/Crohn's:brother  Social History: Reviewed history from 12/07/2008 and no changes required. Retired Single No children Alcohol use No Former Smoker (total of 4 years) Regular exercise-yes: Tai Chi, walking  every other day 1.5 miles  Daily Caffeine Use  2-3 per day Illicit Drug Use - no Drug Use:  no  Review of Systems       The patient complains of arthritis/joint pain, fatigue, sore throat, and vision changes.  The patient denies allergy/sinus, anemia, anxiety-new, back pain, blood in urine, breast changes/lumps, change in vision, confusion, cough, coughing up blood, depression-new, fainting, fever, headaches-new, hearing problems, heart murmur, heart rhythm changes, itching, muscle pains/cramps, night sweats, nosebleeds, shortness of breath, skin rash, sleeping problems, swelling of feet/legs, swollen lymph glands, thirst - excessive, urination - excessive, urination changes/pain, urine leakage, and voice change.    Vital Signs:  Patient profile:   70 year old female Height:      65 inches Weight:      142 pounds BMI:     23.72 BSA:     1.71 Pulse rate:   64 / minute Pulse rhythm:   regular BP sitting:   110 / 70  (left arm)  Vitals Entered By: Milford Cage NCMA (February 09, 2009 9:30 AM)  Physical Exam  General:  Well developed, well nourished, no acute distress.healthy appearing.   Head:  Normocephalic and atraumatic. Eyes:  PERRLA, no icterus.exam deferred to patient's ophthalmologist.   Mouth:  No deformity or lesions, dentition normal. Neck:  Supple; no masses or thyromegaly. Lungs:  Clear throughout to auscultation. Heart:  Regular rate and rhythm; no murmurs, rubs,  or bruits.There is a late systolic murmur at the left lower sternal border. Abdomen:  Soft, nontender and nondistended. No masses, hepatosplenomegaly or hernias noted. Normal bowel sounds. Extremities:  No clubbing, cyanosis, edema or deformities noted. Neurologic:  Alert and  oriented x4;  grossly normal neurologically. Cervical Nodes:  No significant cervical adenopathy. Inguinal Nodes:  No significant inguinal adenopathy. Psych:  Alert and cooperative. Normal mood and affect.   Impression &  Recommendations:  Problem # 1:  ABDOMINAL BLOATING (ICD-787.3) Assessment Unchanged I suspect she has irritable bowel syndrome and I have treated her with probiotic therapy for 2 weeks. Previous colonoscopy was unremarkable.  Problem # 2:  GERD (ICD-530.81) Assessment: Deteriorated I will place her on Dexilant 60 mg 30 minutes before breakfast lungs standard antireflux maneuvers. Endoscopy and possible dilatation has been scheduled at  her convenience.  Problem # 3:  RHEUMATOID ARTHRITIS (ICD-714.0) Assessment: Improved Continue methotrexate and Enbrel per rheumatology.  Problem # 4:  COLONIC POLYPS, HX OF (ICD-V12.72) Assessment: Unchanged she is up-to-date on her colonoscopy exam. Followup as per routine clinical protocol.  Patient Instructions: 1)  Copy sent to : Dr. Marga Melnick 2)  Please continue current medications.  3)  Avoid foods high in acid content ( tomatoes, citrus juices, spicy foods) . Avoid eating within 3 to 4 hours of lying down or before exercising. Do not over eat; try smaller more frequent meals. Elevate head of bed four inches when sleeping.  4)  Conscious Sedation brochure given.  5)  Upper Endoscopy with Dilatation brochure given. 6)  Start Dexilant 60 mg 30 minutes before breakfast.  Appended Document: Orders Update    Clinical Lists Changes  Medications: Added new medication of ALIGN   CAPS (MISC INTESTINAL FLORA REGULAT) Take one capsule by mouth daily Added new medication of  DEXILANT 60 MG CPDR (DEXLANSOPRAZOLE) take one by mouth once daily Orders: Added new Test order of EGD (EGD) - Signed

## 2010-10-12 ENCOUNTER — Other Ambulatory Visit: Payer: Self-pay | Admitting: Gastroenterology

## 2010-10-12 MED ORDER — ESOMEPRAZOLE MAGNESIUM 40 MG PO CPDR: 40.0000 mg | DELAYED_RELEASE_CAPSULE | Freq: Two times a day (BID) | ORAL | Status: DC

## 2010-10-12 NOTE — Telephone Encounter (Signed)
rx sent

## 2010-10-19 NOTE — Op Note (Signed)
NAME:  Felicia [name], Felicia [name]                  ACCOUNT NO.:  [id number]   MEDICAL RECORD NO.:  [id number]          PATIENT TYPE:  AMB   LOCATION:  DAY                          FACILITY:  [facility]   PHYSICIAN:  Leona Singleton, M.D.     DATE OF BIRTH:  [birth year]   DATE OF PROCEDURE:  02/26/2005  DATE OF DISCHARGE:                                 OPERATIVE REPORT   PREOPERATIVE DIAGNOSIS:  Postmenopausal bleeding, on hormone replacement  therapy.   POSTOPERATIVE DIAGNOSIS:  Postmenopausal bleeding, on hormone replacement  therapy.   OPERATION:  Dilatation and curettage.   SURGEON:  Leona Singleton, M.D.   ANESTHESIA:  MAC.   FINDINGS/PROCEDURE:  Patient was prepped and draped in the usual fashion for  a vaginal procedure.  The patient was found to have a tight vagina, and a  narrow speculum was used to visualize the cervix.  The cervix was grasped  with a single-tooth tenaculum.  The cervix was sounded 2-1/4 inches.  The  cervix was dilated, and the cavity entered with a #1 curette.  Only a small  amount of tissue was obtained.  Additional tissue was sought with a serrated  curette and Randall stone forceps.  The tissue return was still minimal.  The blood loss was minimal.  The patient tolerated the procedure well and  was stable to the recovery room in good condition.           ______________________________  Leona Singleton, M.D.     KB/MEDQ  D:  02/26/2005  T:  02/26/2005  Job:  [id number]

## 2010-10-19 NOTE — Op Note (Signed)
[facility]  Patient:    Felicia [name], Felicia [name] Visit Number: [id number] MRN: [id number]          Service Type: DSU Location: DAY Attending Physician:  Rosalee Kaufman Dictated by:   Harl Bowie, M.D. Proc. Date: 10/07/01 Admit Date:  10/07/2001                             Operative Report  PREOPERATIVE DIAGNOSIS:  Postmenopausal bleeding.  POSTOPERATIVE DIAGNOSIS:  Postmenopausal bleeding.  OPERATION:  Dilation and curettage.  SURGEON:  Harl Bowie, M.D.  ANESTHESIA:  MAC with local.  FINDINGS AND PROCEDURE:  The patient was prepped and draped in the usual fashion for vaginal procedure. The patient was examined and found to have a uterus top normal size with adnexa clear. Following, a speculum was placed in the vagina. The cervix was grasped with a single-tooth tenaculum. Xylocaine 1% was infiltrated around the cervix. The cervix was then sounded to 3 inches. The cervix was dilated and the cavity entered with a sharp curet. A small amount of tissue was obtained. No additional tissue was obtained with Randall stone forceps and a serrated curet. Blood loss was minimal during the procedure. The patient tolerated the procedure well and was sent to the recovery room in good condition. Dictated by:   Harl Bowie, M.D. Attending Physician:  Rosalee Kaufman DD:  10/07/01 TD:  10/07/01 Job: [id number]

## 2011-01-18 ENCOUNTER — Ambulatory Visit: Payer: Self-pay

## 2011-03-22 ENCOUNTER — Encounter: Payer: Self-pay | Admitting: Internal Medicine

## 2011-04-08 ENCOUNTER — Encounter: Payer: Self-pay | Admitting: Internal Medicine

## 2011-04-12 ENCOUNTER — Telehealth: Payer: Self-pay | Admitting: *Deleted

## 2011-04-12 NOTE — Telephone Encounter (Signed)
Called [employer] at [phone] and did a prior authorization .  Went through the steps for the prior authorization and it was approved until 04-11-2012.  I notated the prior authorization letter, and stamped it with Dr. Norval Gable signature  and sent it to be scanned.

## 2011-06-03 ENCOUNTER — Encounter: Payer: Self-pay | Admitting: Internal Medicine

## 2011-06-06 ENCOUNTER — Ambulatory Visit (INDEPENDENT_AMBULATORY_CARE_PROVIDER_SITE_OTHER): Payer: Medicare Other | Admitting: Internal Medicine

## 2011-06-06 ENCOUNTER — Encounter: Payer: Self-pay | Admitting: Internal Medicine

## 2011-06-06 VITALS — BP 130/88 | HR 69 | Temp 97.4°F | Resp 12 | Ht 65.75 in | Wt 138.8 lb

## 2011-06-06 DIAGNOSIS — Z136 Encounter for screening for cardiovascular disorders: Secondary | ICD-10-CM

## 2011-06-06 DIAGNOSIS — Z86010 Personal history of colonic polyps: Secondary | ICD-10-CM

## 2011-06-06 DIAGNOSIS — Z0000 Encounter for general adult medical examination without abnormal findings: Secondary | ICD-10-CM

## 2011-06-06 DIAGNOSIS — E785 Hyperlipidemia, unspecified: Secondary | ICD-10-CM

## 2011-06-06 DIAGNOSIS — H409 Unspecified glaucoma: Secondary | ICD-10-CM | POA: Insufficient documentation

## 2011-06-06 NOTE — Patient Instructions (Signed)
The triggers for dyspepsia or "heart burn"  include stress; the "aspirin family" ; alcohol; peppermint; and caffeine (coffee, tea, cola, and chocolate). The aspirin family would include aspirin and the nonsteroidal agents such as ibuprofen &  Naproxen. Tylenol would not cause reflux. If having dyspepsia ; food & drink should be avoided for @ least 2 hours before going to bed. Please complete stool cards Please  Complete fasting labs @ Dr Fat[facility] S[facility] : [facility]. PLEASE BRING THESE INSTRUCTIONS TO THAT FOLLOW UP  LAB APPOINTMENT.This will guarantee correct labs are drawn, eliminating need for repeat blood sampling ( needle sticks ! ). Diagnoses /Codes: 272.4

## 2011-06-06 NOTE — Progress Notes (Signed)
Subjective:    Patient ID: Felicia [name], female    DOB: 1940/09/28, 71 y.o.   MRN: [id number]  HPI Medicare Wellness Visit:  The following psychosocial & medical history were reviewed as required by Medicare.   Social history: caffeine: 2 cups/ day in am & 1 coffee or tea later , alcohol:  no ,  tobacco use : quit 1973  & exercise : in [place] until surgery.   Home & personal  safety / fall risk: no issues, activities of daily living: using tools because of RA, seatbelt use : yes , and smoke alarm employment : yes .  Power of Attorney/Living Will status : Ophth being seen almost every 2 weeks. Whisper heard @ 6 ft.  Orientation :oriented x3 , memory & recall :good, spelling  testing: good ,and mood & affect : normal . Depression / anxiety: stress with Ophth & RA issues Travel history : last 73 [place] , immunization status :unable to take Shingles , transfusion history:  no, and preventive health surveillance ( colonoscopies, BMD , etc as per protocol/ SOC): up to date, Dental care:  Seen 12/12 . Chart reviewed &  Updated. Active issues reviewed & addressed.       Review of Systems  She's had a flare of rheumatoid arthritis symptoms and she had to come off her medications prior to ophthalmologic surgery.  Reflux is well controlled. She denies persistent hoarseness, dysphagia, abdominal pain, rectal bleeding,unexplained weight loss or melena. Stools are dark due to iron supplementation;HCT was 33.9 in 11/12  She has intermittent eustachian tube type symptoms unilaterally. She's been evaluated by a ENT  as well as her dentist. They and her rheumatologist question  if this is related to TMJ issues in the context of her rheumatoid arthritis     Objective:   Physical Exam Gen.: Healthy and well-nourished in appearance. Alert, appropriate and cooperative throughout exam. Head: Normocephalic without obvious abnormalities Eyes: No corneal or conjunctival inflammation noted. Pupils slightly  asymmetric; ptosis OS. Extraocular motion intact.  Ears: External  ear exam reveals no significant lesions or deformities. Canals clear .TMs normal.  Nose: External nasal exam reveals no deformity or inflammation. Nasal mucosa are pink and moist. No lesions or exudates noted.  Mouth: Oral mucosa and oropharynx reveal no lesions or exudates. Teeth in good repair. There is no significant erythema the oropharynx. There is asymmetry of TMJ excursion; R has greater excursion than the L Neck: No deformities, masses, or tenderness noted. Range of motion decreased range of motion laterally. Thyroid normal. Lungs: Normal respiratory effort; chest expands symmetrically. Lungs are clear to auscultation without rales, wheezes, or increased work of breathing. Heart: Normal rate and rhythm. Normal S1 and S2. No gallop, click, or rub. No murmur. Abdomen: Bowel sounds normal; abdomen soft and nontender. No masses, organomegaly or hernias noted. Genitalia: Dr Henderson Cloud, [place] [facility]   .                                                                                   Musculoskeletal/extremities: No deformity or scoliosis noted of  the thoracic or lumbar spine. No clubbing, cyanosis, edema, or deformity noted. Range of motion  normal .  Tone & strength  normal.Joints are surprisingly normal; minimal isolated DIP changes . Nail health  good Vascular: Carotid, radial artery, dorsalis pedis and  posterior tibial pulses are full and equal. No bruits present. Neurologic: Alert and oriented x3. Deep tendon reflexes symmetrical and normal.          Skin: Intact without suspicious lesions or rashes. Lymph: No cervical, axillary lymphadenopathy present. Psych: Mood and affect are normal. Normally interactive                                                                                         Assessment & Plan:  #1 Medicare Wellness Exam; criteria met ; data entered #2 GERD with anemia #3Problem List reviewed ; Assessment/  Recommendations made Plan: see Orders

## 2011-06-18 ENCOUNTER — Other Ambulatory Visit (INDEPENDENT_AMBULATORY_CARE_PROVIDER_SITE_OTHER): Payer: Medicare Other

## 2011-06-18 DIAGNOSIS — Z1211 Encounter for screening for malignant neoplasm of colon: Secondary | ICD-10-CM

## 2011-06-18 LAB — HEMOCCULT GUIAC POC 1CARD (OFFICE)
Card #2 Fecal Occult Blod, POC: NEGATIVE
Card #3 Fecal Occult Blood, POC: NEGATIVE
Fecal Occult Blood, POC: NEGATIVE

## 2011-07-02 ENCOUNTER — Telehealth: Payer: Self-pay | Admitting: Gastroenterology

## 2011-07-02 NOTE — Telephone Encounter (Signed)
I have called and per insurance and pharmacy pt had nexium filled on 06/21/2011 and there was no problems and they do no see that a prior auth is needed I have avised pt and when she goes to fill rx in feb if I need to I can do a prior auth in feb I will

## 2011-07-10 ENCOUNTER — Ambulatory Visit: Payer: Medicare Other

## 2011-07-10 ENCOUNTER — Ambulatory Visit (INDEPENDENT_AMBULATORY_CARE_PROVIDER_SITE_OTHER): Payer: Medicare Other | Admitting: Family Medicine

## 2011-07-10 VITALS — BP 168/78 | HR 82 | Temp 97.9°F | Resp 18 | Ht 64.5 in | Wt 137.6 lb

## 2011-07-10 DIAGNOSIS — M542 Cervicalgia: Secondary | ICD-10-CM

## 2011-07-10 DIAGNOSIS — M5412 Radiculopathy, cervical region: Secondary | ICD-10-CM

## 2011-07-10 DIAGNOSIS — M069 Rheumatoid arthritis, unspecified: Secondary | ICD-10-CM

## 2011-07-10 DIAGNOSIS — M5030 Other cervical disc degeneration, unspecified cervical region: Secondary | ICD-10-CM | POA: Insufficient documentation

## 2011-07-10 MED ORDER — GABAPENTIN 300 MG PO CAPS: 300.0000 mg | ORAL_CAPSULE | Freq: Every day | ORAL | Status: DC

## 2011-07-10 MED ORDER — HYDROCODONE-ACETAMINOPHEN 5-500 MG PO TABS: 1.0000 | ORAL_TABLET | Freq: Three times a day (TID) | ORAL | Status: AC | PRN

## 2011-07-10 MED ORDER — CYCLOBENZAPRINE HCL 10 MG PO TABS: 10.0000 mg | ORAL_TABLET | Freq: Every evening | ORAL | Status: AC | PRN

## 2011-07-10 NOTE — Progress Notes (Signed)
  Subjective:    Patient ID: Felicia [name], female    DOB: 12-14-1940, 71 y.o.   MRN: [id number]  HPI  Patient presents c/o (R) shoulder/neck pain after lifting vacuum cleaner out of her car. Symptoms progressive over the last 24 hours.  Pain is quite severe and has a burning quality.  No radiation down her (R) arm. No weakness of her (R) UE  Using warm/cold compresses and tylenol without significant relief.  PMH  RA (on immunosuppresive therapy)  Review of Systems      Objective:   Physical Exam  Constitutional: She appears well-developed and well-nourished.  Cardiovascular: Normal rate, regular rhythm and normal heart sounds.   Pulmonary/Chest: Effort normal and breath sounds normal.  Musculoskeletal:       Cervical back: She exhibits decreased range of motion.       Back:  Neurological: She is alert. She has normal strength and normal reflexes.       [facility] reading (PRIMARY) by  Dr. Hal Hope Severe degenerative changes of the cervical spine.  Assessment & Plan:   1. DDD, cervical spine with radicular symptoms DG Cervical Spine Complete, HYDROcodone-acetaminophen (VICODIN) 5-500 MG per tablet, cyclobenzaprine (FLEXERIL) 10 MG tablet, gabapentin (NEURONTIN) 300 MG capsule, Ambulatory referral to Sports Medicine (per patient request)  2. Rheumatoid Arthritis   3. Elevated BP Patient to monitor BP and follow up with primary MD.  Suspect elevated reading secondary to pain   Follow up INB or with worsening symptoms

## 2011-07-12 ENCOUNTER — Telehealth: Payer: Self-pay

## 2011-07-12 NOTE — Telephone Encounter (Signed)
X-rays ready for pick up. Pt notified.

## 2011-07-12 NOTE — Telephone Encounter (Signed)
.  [facility] PT STATES WE WERE HAVING TROUBLE GETTING A COPY OF HER XRAYS THE OTHER DAY, ARE WE ABLE TO GET NOW, HAVE AN APPT WITH ANOTHER DR ON Monday AND WOULD LIKE TO COME AND P/U. PLEASE CALL [phone]

## 2011-07-15 ENCOUNTER — Encounter: Payer: Self-pay | Admitting: Internal Medicine

## 2011-07-15 ENCOUNTER — Ambulatory Visit (INDEPENDENT_AMBULATORY_CARE_PROVIDER_SITE_OTHER): Payer: Medicare Other | Admitting: Internal Medicine

## 2011-07-15 DIAGNOSIS — M5412 Radiculopathy, cervical region: Secondary | ICD-10-CM

## 2011-07-15 DIAGNOSIS — F40298 Other specified phobia: Secondary | ICD-10-CM

## 2011-07-15 DIAGNOSIS — R030 Elevated blood-pressure reading, without diagnosis of hypertension: Secondary | ICD-10-CM

## 2011-07-15 DIAGNOSIS — F40240 Claustrophobia: Secondary | ICD-10-CM

## 2011-07-15 DIAGNOSIS — R1084 Generalized abdominal pain: Secondary | ICD-10-CM

## 2011-07-15 LAB — CBC WITH DIFFERENTIAL/PLATELET
Basophils Absolute: 0 10*3/uL (ref 0.0–0.1)
Basophils Relative: 0.6 % (ref 0.0–3.0)
Eosinophils Absolute: 0.1 10*3/uL (ref 0.0–0.7)
Eosinophils Relative: 1.1 % (ref 0.0–5.0)
HCT: 36.7 % (ref 36.0–46.0)
Hemoglobin: 12.3 g/dL (ref 12.0–15.0)
Lymphocytes Relative: 26.3 % (ref 12.0–46.0)
Lymphs Abs: 2.1 10*3/uL (ref 0.7–4.0)
MCHC: 33.6 g/dL (ref 30.0–36.0)
MCV: 94.1 fl (ref 78.0–100.0)
Monocytes Absolute: 0.4 10*3/uL (ref 0.1–1.0)
Monocytes Relative: 5.6 % (ref 3.0–12.0)
Neutro Abs: 5.3 10*3/uL (ref 1.4–7.7)
Neutrophils Relative %: 66.4 % (ref 43.0–77.0)
Platelets: 386 10*3/uL (ref 150.0–400.0)
RBC: 3.9 Mil/uL (ref 3.87–5.11)
RDW: 14.8 % — ABNORMAL HIGH (ref 11.5–14.6)
WBC: 7.9 10*3/uL (ref 4.5–10.5)

## 2011-07-15 MED ORDER — DIAZEPAM 2 MG PO TABS: ORAL_TABLET | ORAL | Status: DC

## 2011-07-15 MED ORDER — PROPRANOLOL HCL 20 MG PO TABS: 20.0000 mg | ORAL_TABLET | Freq: Two times a day (BID) | ORAL | Status: DC

## 2011-07-15 NOTE — Progress Notes (Signed)
  Subjective:    Patient ID: Felicia [name], female    DOB: 20-Apr-1941, 71 y.o.   MRN: [id number]  HPI Urgent care records 07/10/11 were reviewed. Diagnosis was cervical radiculopathy.. Cervical spine films reveal degenerative changes C3-T1.  Despite Flexeril 10 mg at bedtime; gabapentin 300 mg daily and Vicodin every 8 hrs she continues to have the pain above the right scapula. She denies fever, chills, sweats, weight loss, or incontinence of urine or stool. Since you don't need medication she's had some diffuse abdominal discomfort. Stools are dark but she is on iron supplementation. She concerned because her blood pressures been elevated with this acute process.    Review of Systems  Blood pressure range: up to 180 systolic; BP was 784/69 pre pain meds Rxed @ [facility]  Chest pain: no  Dyspnea: no  Claudication: no  Lightheadedness: no   Urinary frequency: no   Edema: no Medication compliance: on no meds; no PMH of HTN Preventitive Healthcare:  Diet Pattern: low fat  Salt Restriction: yes  She's had some dysfunctional bleeding and is scheduled for an endometrial biopsy by Dr. Henderson Cloud       Objective:   Physical Exam Gen.: Healthy and well-nourished in appearance. Alert, appropriate and cooperative throughout exam.  Neck: No deformities, masses, or tenderness noted. Range of motion decreased to right lateral rotation. This maneuver induces her right suprascapular pain.  Lungs: Normal respiratory effort; chest expands symmetrically. Lungs are clear to auscultation without rales, wheezes, or increased work of breathing. Heart: Normal rate and rhythm. Normal S1 and S2. No gallop, click, or rub. S4 with slurring at  LSB ; no murmur.                                                                            Musculoskeletal/extremities: No deformity or scoliosis noted of  the thoracic or lumbar spine. No clubbing, cyanosis, edema noted. Range of motion  normal .Strength decreased with testing of the  right eighth cervical nerve.Joints isolated DIP changes. Marked discrepancy between her known diagnosis of rheumatoid arthritis and the minor muscles, changes.  Vascular: Carotid, radial artery, dorsalis pedis and  posterior tibial pulses are full and equal. No bruits present. Neurologic: Alert and oriented x3. Deep tendon reflexes symmetrical and normal.          Skin: Intact without suspicious lesions or rashes. No upper back zoster rash. Lymph: No cervical, axillary lymphadenopathy present. Psych: Mood and affect are very low key and stoic. Normally interactive                                                                                         Assessment & Plan:    #1 cervical and possibly thoracic radiculopathy  #2 elevated blood pressure due to pain  #3 abdominal discomfort since initiation of pain medications Plan: See orders and recommendations

## 2011-07-15 NOTE — Patient Instructions (Signed)
Blood Pressure Goal  Ideally is an AVERAGE < 135/85. This AVERAGE should be calculated from @ least 5-7 BP readings taken @ different times of day on different days of week. You should not respond to isolated BP readings , but rather the AVERAGE for that week   Take the gabapentin one every 8 hours as needed for the suprascapular pain

## 2011-07-16 ENCOUNTER — Ambulatory Visit (HOSPITAL_BASED_OUTPATIENT_CLINIC_OR_DEPARTMENT_OTHER)
Admission: RE | Admit: 2011-07-16 | Discharge: 2011-07-16 | Disposition: A | Discharge status: Home / Self Care | Payer: Medicare Other | Source: Ambulatory Visit | Attending: Internal Medicine | Admitting: Internal Medicine

## 2011-07-16 DIAGNOSIS — M79609 Pain in unspecified limb: Secondary | ICD-10-CM | POA: Insufficient documentation

## 2011-07-16 DIAGNOSIS — M5020 Other cervical disc displacement, unspecified cervical region: Secondary | ICD-10-CM | POA: Insufficient documentation

## 2011-07-16 DIAGNOSIS — M5090 Cervical disc disorder, unspecified, unspecified cervical region: Secondary | ICD-10-CM | POA: Insufficient documentation

## 2011-07-16 DIAGNOSIS — R079 Chest pain, unspecified: Secondary | ICD-10-CM

## 2011-07-16 DIAGNOSIS — M25519 Pain in unspecified shoulder: Secondary | ICD-10-CM | POA: Insufficient documentation

## 2011-07-16 DIAGNOSIS — M5412 Radiculopathy, cervical region: Secondary | ICD-10-CM

## 2011-07-17 ENCOUNTER — Other Ambulatory Visit: Payer: Self-pay | Admitting: Internal Medicine

## 2011-07-17 ENCOUNTER — Telehealth: Payer: Self-pay

## 2011-07-17 DIAGNOSIS — M5412 Radiculopathy, cervical region: Secondary | ICD-10-CM

## 2011-07-17 NOTE — Telephone Encounter (Signed)
Left message with patient's room mate to have patient return call when available , reason for call (not left with room mate):  The disc protrusions @ C 7-T1 and T1-2 could explain the symptoms as these might impinge right C8 and T1 nerve. Neurosurgical referral recommended. Do you have a preference. [employer]

## 2011-07-17 NOTE — Telephone Encounter (Signed)
Pt is notified of results and is agreeable to neurosurgical referral.  She has no preference for neuorsurgeon as long as it is "a good one."  Advised we will make referral, but it may take a few days to get appt.  She is agreeable.

## 2011-07-22 NOTE — Telephone Encounter (Signed)
Neurosurgical referral made; awaiting response from their office

## 2011-07-23 ENCOUNTER — Telehealth: Payer: Self-pay | Admitting: *Deleted

## 2011-07-23 NOTE — Telephone Encounter (Signed)
Pt called stating her BP readings are still high after 1 week of taking them. Pt also states that she saw the neurosurgeon for her shoulder & he prescribed her prednisone.

## 2011-07-23 NOTE — Telephone Encounter (Signed)
Discuss with Bonita Quin will advise Pt of info and advise Pt to return call with any question.

## 2011-07-23 NOTE — Telephone Encounter (Signed)
Please increase the propranolol 20 mg to 2 pills or 40 mg twice a day; see me if the blood pressure is not goal.Blood Pressure Goal  Ideally is an AVERAGE < 135/85. This AVERAGE should be calculated from @ least 5-7 BP readings taken @ different times of day on different days of week. You should not respond to isolated BP readings , but rather the AVERAGE for that week

## 2011-08-05 ENCOUNTER — Emergency Department (HOSPITAL_COMMUNITY): Payer: Medicare Other

## 2011-08-05 ENCOUNTER — Emergency Department (HOSPITAL_COMMUNITY)
Admission: EM | Admit: 2011-08-05 | Discharge: 2011-08-05 | Disposition: A | Discharge status: Home / Self Care | Payer: Medicare Other | Attending: Emergency Medicine | Admitting: Emergency Medicine

## 2011-08-05 ENCOUNTER — Telehealth: Payer: Self-pay

## 2011-08-05 ENCOUNTER — Encounter (HOSPITAL_COMMUNITY): Payer: Self-pay | Admitting: *Deleted

## 2011-08-05 ENCOUNTER — Telehealth: Payer: Self-pay | Admitting: *Deleted

## 2011-08-05 DIAGNOSIS — R110 Nausea: Secondary | ICD-10-CM | POA: Insufficient documentation

## 2011-08-05 DIAGNOSIS — K219 Gastro-esophageal reflux disease without esophagitis: Secondary | ICD-10-CM | POA: Insufficient documentation

## 2011-08-05 DIAGNOSIS — Z79899 Other long term (current) drug therapy: Secondary | ICD-10-CM | POA: Insufficient documentation

## 2011-08-05 DIAGNOSIS — R51 Headache: Secondary | ICD-10-CM

## 2011-08-05 DIAGNOSIS — H409 Unspecified glaucoma: Secondary | ICD-10-CM | POA: Insufficient documentation

## 2011-08-05 DIAGNOSIS — I10 Essential (primary) hypertension: Secondary | ICD-10-CM | POA: Insufficient documentation

## 2011-08-05 DIAGNOSIS — M069 Rheumatoid arthritis, unspecified: Secondary | ICD-10-CM | POA: Insufficient documentation

## 2011-08-05 HISTORY — DX: Essential (primary) hypertension: I10

## 2011-08-05 LAB — POCT I-STAT, CHEM 8
BUN: 13 mg/dL (ref 6–23)
Calcium, Ion: 1.27 mmol/L (ref 1.12–1.32)
Chloride: 103 mEq/L (ref 96–112)
Creatinine, Ser: 0.7 mg/dL (ref 0.50–1.10)
Glucose, Bld: 103 mg/dL — ABNORMAL HIGH (ref 70–99)
HCT: 39 % (ref 36.0–46.0)
Hemoglobin: 13.3 g/dL (ref 12.0–15.0)
Potassium: 4.1 mEq/L (ref 3.5–5.1)
Sodium: 134 mEq/L — ABNORMAL LOW (ref 135–145)
TCO2: 24 mmol/L (ref 0–100)

## 2011-08-05 MED ORDER — METOCLOPRAMIDE HCL 5 MG/ML IJ SOLN
10.0000 mg | Freq: Once | INTRAMUSCULAR | Status: AC
  Administered 2011-08-05: 10 mg via INTRAVENOUS
  Filled 2011-08-05: qty 2

## 2011-08-05 NOTE — ED Notes (Signed)
Warming measures provided to patient and family

## 2011-08-05 NOTE — Discharge Instructions (Signed)
General Headache, Without Cause A general headache has no specific cause. These headaches are not life-threatening. They will not lead to other types of headaches. HOME CARE   Make and keep follow-up visits with your doctor.   Only take medicine as told by your doctor.   Try to relax, get a massage, or use your thoughts to control your body (biofeedback).   Apply cold or heat to the head and neck. Apply 3 or 4 times a day or as needed.  Finding out the results of your test Ask when your test results will be ready. Make sure you get your test results. GET HELP RIGHT AWAY IF:   You have problems with medicine.   Your medicine does not help relieve pain.   Your headache changes or becomes worse.   You feel sick to your stomach (nauseous) or throw up (vomit).   You have a temperature by mouth above 102 F (38.9 C), not controlled by medicine.   Your have a stiff neck.   You have vision loss.   You have muscle weakness.   You lose control of your muscles.   You lose balance or have trouble walking.   You feel like you are going to pass out (faint).  MAKE SURE YOU:   Understand these instructions.   Will watch this condition.   Will get help right away if you are not doing well or get worse.  Document Released: 02/27/2008 Document Revised: 05/09/2011 Document Reviewed: 02/27/2008 [facility] Patient Information 2012 [place], [place].   Take her medications as prescribed. Call Dr. Alwyn Ren is office to arrange to get your blood pressure rechecked within the next 2 weeks

## 2011-08-05 NOTE — Telephone Encounter (Signed)
[facility] Triage Call Report Triage Record Num: [id number] Operator: Arline Asp Loftin Patient Name: Felicia [name] Call Date & Time: 08/05/2011 9:00:55AM Patient Phone: [phone] PCP: Marga Melnick Patient Gender: Female PCP Fax : [phone] Patient DOB: 31-Jul-1940 Practice Name: [facility] Reason for Call: Caller: Linda/Other; PCP: Marga Melnick; CB#: [phone]; Call regarding Blood Pressure Issues 6:00am 188/102 Took Medication Then 137/81 Dizzy; Seen per Dr Alwyn Ren 02/11, started HTN med, has been on 2 tabs qam and evening, decided to taper herself off on 03/01, and changed to 1 tab BID. Woke 03/04 with severe headache, 8-9 out of 10, slight "wooziness." Has taken 2 Tylenol at 0730, mild relief. Care advice per "Hypertension, Diagnosed" with 911 disp due to "sudden change in mental status." Per RN judgement, spoke with Derry Lory RN in office, who advised to go to ED. Pt advised [facility], will comply. Call back for further concerns. THE PATIENT REFUSED 911; Protocol(s) Used: Hypertension, Diagnosed or Suspected Recommended Outcome per Protocol: Activate EMS 911 Override Outcome if Used in Protocol: Call Provider Immediately RN Reason for Override Outcome: Nursing Judgement Used. Reason for Outcome: Sudden change in mental status Care Advice: ~ Do not give the patient anything to eat or drink. ~ An adult should stay with the patient, preferably one trained in CPR. Write down provider's name. List or place the following in a bag for transport with the patient: current prescription and/or nonprescription medications; alternative treatments, therapies and medications; and street drugs. ~ 03   This message is completed, Dr.Tabori advised for patient to go to ED

## 2011-08-05 NOTE — ED Notes (Signed)
Pt states she woke around 5am this morning with headache.  Pt reports no improvement since waking.

## 2011-08-05 NOTE — ED Notes (Signed)
Patient woke with headache today and nausea.  She took her bp and noted it was high, 188/102.  Patient also reports she feels light headed.  Patient denies chest pain,  Denies neck pain.  She took tylenol for her headache

## 2011-08-05 NOTE — Telephone Encounter (Signed)
Call a nurse Arline Asp advised that pt tapered her BP medication herself on Friday and woke up this am with severe headache, severe dizziness as well as elevated BP concerns, pt took tylenol and noted the headache and dizziness and headache has gotten better but now notes confusion, call a nurse was not sure weather she should send the pt to the ER per her sxs have gotten better, advised per pt HX, age, current sxs she does need to go to the ER, verbal instructions given per MD Tabori  to verify correct advise given to call a nurse to advise pt to go to the ER.

## 2011-08-05 NOTE — ED Provider Notes (Addendum)
History     CSN: [id number]  Arrival date & time 08/05/11  1023   First MD Initiated Contact with Patient 08/05/11 1033      Chief Complaint  Patient presents with  . Headache  . Nausea  . Hypertension    (Consider location/radiation/quality/duration/timing/severity/associated sxs/prior treatment) HPI Complains of a frontal and bitemporal headache onset upon awakening this morning accompanied by nausea no other complaint no treatment prior to coming here felt well when she went to bed last night no visual change no eye pain no other associated symptoms. Patient took her blood pressure was which was noted to be approximately 180 systolic she then took her propranolol. Patient has been cut cutting back her propranolol dose over recent days as she has been running low . She is prescribed 40 mg twice daily she has cut back to 20 mg twice daily without consulting her physician Past Medical History  Diagnosis Date  . GERD (gastroesophageal reflux disease)   . Rheumatoid arthritis   . Glaucoma   . Hypertension     Past Surgical History  Procedure Date  . Cataract extraction, bilateral   . Trabeculectomy     X 2 OD; X 1 OS  . Upper gastrointestinal endoscopy     hiatal hernia  . Colonoscopy w/ polypectomy     X1    Family History  Problem Relation Age of Onset  . Dementia Mother   . Lung disease Mother     bronchiectasis  . Heart disease Mother     Aortic Stenosis  . Osteoporosis Mother   . Heart attack Father     MI @ 36  . Stroke Maternal Grandmother     History  Substance Use Topics  . Smoking status: Former Smoker    Quit date: 06/04/1971  . Smokeless tobacco: Not on file   Comment: smoked about 5-6 years   . Alcohol Use: No    OB History    Grav Para Term Preterm Abortions TAB SAB Ect Mult Living                  Review of Systems  Constitutional: Negative.   Respiratory: Negative.   Cardiovascular: Negative.   Gastrointestinal: Positive for nausea.    Musculoskeletal: Negative.   Skin: Negative.   Neurological: Positive for headaches.  Hematological: Negative.   Psychiatric/Behavioral: Negative.   All other systems reviewed and are negative.    Allergies  Sulfonamide derivatives  Home Medications   Current Outpatient Rx  Name Route Sig Dispense Refill  . ASPIRIN 81 MG PO TABS Oral Take 81 mg by mouth daily.     Marland Kitchen CALCIUM CARBONATE-VITAMIN D 600-400 MG-UNIT PO TABS Oral Take 1 tablet by mouth daily.    Marland Kitchen REFRESH OP Ophthalmic Apply to eye as needed.      Marland Kitchen ESOMEPRAZOLE MAGNESIUM 40 MG PO CPDR Oral Take 40 mg by mouth daily.    Marland Kitchen ESTROGENS CONJUGATED 0.625 MG PO TABS Oral Take 0.625 mg by mouth every evening.     Marland Kitchen ETANERCEPT 50 MG/ML [place] SOLN Subcutaneous Inject 50 mg into the skin once a week. Sunday    . FOLIC ACID 1 MG PO TABS Oral Take 2 mg by mouth daily.    . IRON PO TABS Oral Take 1 tablet by mouth daily.    Marland Kitchen KRILL OIL OMEGA-3 300 MG PO CAPS Oral Take 1 capsule by mouth daily.    Marland Kitchen LATANOPROST 0.005 % OP SOLN Right Eye Place  1 drop into the right eye at bedtime.      . METHOTREXATE SODIUM IJ Injection Inject 25 mg as directed once a week. Wednesday    . METRONIDAZOLE 1 % EX GEL Topical Apply 1 application topically every evening.     Marland Kitchen ALIVE ONCE DAILY WOMENS 50+ PO Oral Take 1 tablet by mouth daily.    Marland Kitchen PREDNISONE (PAK) 10 MG PO TABS Oral Take 10 mg by mouth daily. Taper. As needed for arthritic flare ups.    . ALIGN PO Oral Take 1 tablet by mouth every evening.    Marland Kitchen PROGESTERONE MICRONIZED 100 MG PO CAPS Oral Take 100 mg by mouth every evening.     Marland Kitchen PROPRANOLOL HCL 20 MG PO TABS Oral Take 40 mg by mouth 2 (two) times daily.    Marland Kitchen TIMOLOL HEMIHYDRATE 0.5 % OP SOLN Right Eye Place 1 drop into the right eye daily.      Marland Kitchen ZOLPIDEM TARTRATE 10 MG PO TABS Oral Take 10 mg by mouth at bedtime as needed.      Marland Kitchen DIAZEPAM 2 MG PO TABS  1 -2 every 8 hours as needed for anxiety 30 tablet 0    BP 159/72  Pulse 66  Temp(Src)  97.3 F (36.3 C) (Oral)  Resp 16  SpO2 100%  Physical Exam  Nursing note and vitals reviewed. Constitutional: She appears well-developed and well-nourished.  HENT:  Head: Normocephalic and atraumatic.  Eyes: Conjunctivae are normal. Pupils are equal, round, and reactive to light.  Neck: Neck supple. No tracheal deviation present. No thyromegaly present.  Cardiovascular: Normal rate and regular rhythm.   No murmur heard. Pulmonary/Chest: Effort normal and breath sounds normal.  Abdominal: Soft. Bowel sounds are normal. She exhibits no distension. There is no tenderness.  Musculoskeletal: Normal range of motion. She exhibits no edema and no tenderness.  Neurological: She is alert. She has normal reflexes. No cranial nerve deficit. Coordination normal.  Skin: Skin is warm and dry. No rash noted.  Psychiatric: She has a normal mood and affect. Her behavior is normal.    ED Course  Procedures (including critical care time)  Labs Reviewed - No data to display No results found.   1 PM headache improved after treatment with intravenous Reglan patient alert appropriate Glasgow Coma Score 15 No diagnosis found.  Results for orders placed during the hospital encounter of 08/05/11  POCT I-STAT, CHEM 8      Component Value Range   Sodium 134 (*) 135 - 145 (mEq/L)   Potassium 4.1  3.5 - 5.1 (mEq/L)   Chloride 103  96 - 112 (mEq/L)   BUN 13  6 - 23 (mg/dL)   Creatinine, Ser 1.61  0.50 - 1.10 (mg/dL)   Glucose, Bld 096 (*) 70 - 99 (mg/dL)   Calcium, Ion 0.45  4.09 - 1.32 (mmol/L)   TCO2 24  0 - 100 (mmol/L)   Hemoglobin 13.3  12.0 - 15.0 (g/dL)   HCT 81.1  91.4 - 78.2 (%)   Dg Cervical Spine Complete  07/10/2011  [facility] BY [place] [facility] *RADIOLOGY REPORT*  Clinical Data: Right shoulder pain  CERVICAL SPINE - COMPLETE 4+ VIEW  Comparison: None.  Findings: There is narrowing of interspaces from C3-T1.  Small end plate spurs are noted at all levels from C4-C7.  Normal alignment. No  prevertebral soft tissue swelling.  Uncovertebral hypertrophy results in encroachment upon neural foramina bilaterally C3-4, C4- 5, C5-6.  Negative for fracture.  Bilateral carotid bifurcation  calcified plaque is noted.  IMPRESSION:  1.  Negative for fracture or other acute bony abnormality. 2.  Degenerative changes C3-T1 as enumerated above. 3.  Bilateral carotid bifurcation calcified plaque.  Original Report Authenticated By: Osa Craver, M.D.   Ct Head Wo Contrast  08/05/2011  *RADIOLOGY REPORT*  Clinical Data: Headache, hypertension  CT HEAD WITHOUT CONTRAST  Technique:  Contiguous axial images were obtained from the base of the skull through the vertex without contrast.  Comparison: None.  Findings: No evidence of parenchymal hemorrhage or extra-axial fluid collection. No mass lesion, mass effect, or midline shift.  No CT evidence of acute infarction.  Subcortical white matter and periventricular small vessel ischemic changes.  Global cortical atrophy.  No ventriculomegaly.  The visualized paranasal sinuses are essentially clear. The mastoid air cells are unopacified.  No evidence of calvarial fracture.  IMPRESSION: No evidence of acute intracranial abnormality.  Atrophy with small vessel ischemic changes.  Original Report Authenticated By: Charline Bills, M.D.   Mr Cervical Spine Wo Contrast  07/16/2011  *RADIOLOGY REPORT*  Clinical Data: Right shoulder and arm pain and anterior chest pain. 723.4.  MRI CERVICAL SPINE WITHOUT CONTRAST  Technique:  Multiplanar and multiecho pulse sequences of the cervical spine, to include the craniocervical junction and cervicothoracic junction, were obtained according to standard protocol without intravenous contrast.  Comparison: Radiographs dated 07/10/2011  Findings: Scan extends from the top of the clivus through T2-3. The visualized intracranial contents are normal.  Cervical spinal cord is normal with no mass lesion or myelopathy or significant spinal  cord compression.  Paraspinal soft tissues are normal.  C1-2 and C2-3:  Normal.  C3-4:  Minimal left uncinate spurring. Slight left facet arthritis.  C4-5:  Small broad-based disc bulge with bilateral uncinate spurring without foraminal stenosis.  C5-6:  Disc space narrowing with slight broad-based osteophyte formation with slight narrowing of the neural foramina bilaterally.  C6-7:  Marked disc space narrowing.  Minimal left uncinate spurring.  C7-T1:  Tiny focal soft disc protrusion into the right lateral recess seen only on image number 19 of series 5.  This might affect the right C8 nerve.  No foraminal stenosis. Mild left facet arthritis.  T1-2:  Tiny disc protrusion into the right lateral recess seen only on image number 25 of series 5.  This might affect the right T1 nerve. Mild left facet arthritis.  IMPRESSION:    Tiny soft disc protrusions at C7-T1 and T1-2 into the right lateral recesses which could affect the right C8 T1 nerves respectively.  Original Report Authenticated By: Gwynn Burly, M.D.     MDM  Doubt hypertensive encephalopathy, given patient's moderately elevated blood pressure and atypical symptoms Doubt acute narrow-angle glaucoma eyes not red no eye pain We'll write prescription for propranolol 40 mg twice a day for 2 weeks Asian is advised to get her blood pressure rechecked within the next 2 weeks Diagnosis #1 nonspecific headache #2 hypertension        Doug Sou, MD 08/05/11 1308  Doug Sou, MD 08/05/11 1310

## 2011-08-13 ENCOUNTER — Ambulatory Visit (INDEPENDENT_AMBULATORY_CARE_PROVIDER_SITE_OTHER): Payer: Medicare Other | Admitting: Internal Medicine

## 2011-08-13 ENCOUNTER — Encounter: Payer: Self-pay | Admitting: Internal Medicine

## 2011-08-13 DIAGNOSIS — R51 Headache: Secondary | ICD-10-CM

## 2011-08-13 DIAGNOSIS — I10 Essential (primary) hypertension: Secondary | ICD-10-CM | POA: Insufficient documentation

## 2011-08-13 DIAGNOSIS — H5702 Anisocoria: Secondary | ICD-10-CM | POA: Insufficient documentation

## 2011-08-13 MED ORDER — PROPRANOLOL HCL 40 MG PO TABS: ORAL_TABLET | ORAL | Status: DC

## 2011-08-13 NOTE — Progress Notes (Signed)
  Subjective:    Patient ID: Felicia [name], female    DOB: [birth year], 71 y.o.   MRN: [id number]  HPI ER visit 08/05/11 was reviewed. She had an incapacitating bilateral, frontal headache with dramatic hypertension prompting emergency room visit. Her blood pressure at home was 188/102.  CAT scan revealed no acute changes.Her blood pressure medicines were continued.  Since the ER visit she's had only minor headaches; treated with Tylenol with benefit. Her blood pressures ranged in the 130s/80s.     Review of Systems She has a history of TMJ & has contacted her dentist Dr. Lindi Adie to replace her non fitting  mouth guard ; the headaches were not localized to the TMJ and temporal area however.  She is receiving rehabilitation for the cervical spine issues and feels this is beneficial.     Objective:   Physical Exam She appears healthy and well-nourished; she is in no acute distress  No carotid bruits are present.  Heart rhythm and rate are normal with no significant murmurs or gallops.  Chest is clear with no increased work of breathing  There is no evidence of aortic aneurysm or renal artery bruits  She has no clubbing or edema.   Pedal pulses are intact   No ischemic skin changes are present   Deep tendon reflexes and strength and normal. No cranial nerve deficits present.  Iridectomy changes are present on right. Left pupil is greater than the right which she states is chronic        Assessment & Plan:  #1  incapacitating, severe headache in the context of dramatic elevation of blood pressure; essentially resolved  #2 TMJ syndrome the headache does not sound as it would be related to this  Plan: Blood pressure monitor without change in regimen at this time. Followup with Dr. Lindi Adie concerning her TMJ.

## 2011-08-13 NOTE — Patient Instructions (Signed)
Blood Pressure Goal  Ideally is an AVERAGE < 135/85. This AVERAGE should be calculated from @ least 5-7 BP readings taken @ different times of day on different days of week. You should not respond to isolated BP readings , but rather the AVERAGE for that week  Please keep a diary of your headaches . Document  each occurrence on the calendar with notation of : #1 any prodrome ( any non headache symptom such as marked fatigue,visual changes, ,etc ) which precedes actual headache ; #2) severity on 1-10 scale; #3) any triggers ( food/ drink,enviromenntal or weather changes ,physical or emotional stress) in 8-12 hour period prior to the headache; & #4) response to any medications or other intervention. Please review "Headache" @ [facility] for additional information.    

## 2011-10-15 ENCOUNTER — Telehealth: Payer: Self-pay | Admitting: Internal Medicine

## 2011-10-15 NOTE — Telephone Encounter (Signed)
Dr Drue Novel will see pt tomorrow.

## 2011-10-15 NOTE — Telephone Encounter (Signed)
Caller: Sharonda/Patient is calling with a question about Propanolol 40MG .The medication was written by Marga Melnick. Pt woke up with her BP reading 170/100 in 1 arm and 172/91 in the other arm. She took her meds and at 09:30 her BP was 126/75. At 11:30 her BP is now 134/75. Pt tells Rn that in Feb she was dx with a bulging disc that caused several changes in her BP due to back pain. Pt was having pain in her hands and wrists last night from RA. Pt self administrs  injections on Sunday and Wednesday (Methotrexate and Enbrel). Pt states that she has been feeling fatigued over the past couple of weeks but she has developed anemia from the injections she takes for RA. Rn triaged and advised appt. Pt scheduled at 11:15 with Dr. Drue Novel tomorrow. Parameters reveiwed of when to call back.

## 2011-10-16 ENCOUNTER — Ambulatory Visit (INDEPENDENT_AMBULATORY_CARE_PROVIDER_SITE_OTHER): Payer: Medicare Other | Admitting: Internal Medicine

## 2011-10-16 ENCOUNTER — Encounter: Payer: Self-pay | Admitting: Internal Medicine

## 2011-10-16 VITALS — BP 150/82 | HR 59 | Temp 98.0°F | Wt 143.0 lb

## 2011-10-16 DIAGNOSIS — I10 Essential (primary) hypertension: Secondary | ICD-10-CM

## 2011-10-16 DIAGNOSIS — I7789 Other specified disorders of arteries and arterioles: Secondary | ICD-10-CM

## 2011-10-16 DIAGNOSIS — I714 Abdominal aortic aneurysm, without rupture: Secondary | ICD-10-CM

## 2011-10-16 DIAGNOSIS — R0989 Other specified symptoms and signs involving the circulatory and respiratory systems: Secondary | ICD-10-CM

## 2011-10-16 MED ORDER — AMLODIPINE BESYLATE 5 MG PO TABS: 5.0000 mg | ORAL_TABLET | Freq: Every day | ORAL | Status: DC

## 2011-10-16 MED ORDER — METOPROLOL TARTRATE 25 MG PO TABS: 25.0000 mg | ORAL_TABLET | Freq: Two times a day (BID) | ORAL | Status: DC

## 2011-10-16 NOTE — Telephone Encounter (Signed)
Seen today. 

## 2011-10-16 NOTE — Progress Notes (Signed)
  Subjective:    Patient ID: Felicia [name], female    DOB: 11/25/1940, 71 y.o.   MRN: [id number]  HPI Acute visit The patient is concerned because her BP is elevated: Yesterday was as high as 171/100, after she took her medication it went down to 159/105. She started BP meds 07-2011, propanolol has been titrated up to 40 mg twice a day. Initially BPs responded well to titration but in the last few days BPs are elevated. She has RA, her aches and pains and not as well-controlled as she desires, she wonders if pain is driving her BP up (is possible)  Past Medical History  Diagnosis Date  . GERD (gastroesophageal reflux disease)   . Rheumatoid arthritis   . Glaucoma   . Hypertension      Review of Systems No chest pain or shortness of breath. No headaches No anxiety or distress. Good compliance of medication, does follow a low-salt diet, does not take Motrin or Motrin-like medications. She does complain of fatigue, was told by rheumatology that she has mild anemia. The fatigue may be slightly worse since she increased propranolol.    Objective:   Physical Exam  General -- alert, well-developed, and well-nourished.   Neck --no thyromegaly Lungs -- normal respiratory effort, no intercostal retractions, no accessory muscle use, and normal breath sounds.   Heart-- normal rate, regular rhythm, no murmur, and no gallop.   Abdomen--soft, non-tender, no distention,  no HSM, no guarding, and no rigidity.  She does have a bruit and the upper abdomen, aorta is barely palpable but not tender. Extremities-- no pretibial edema bilaterally Neurologic-- alert & oriented X3 and strength normal in all extremities. Psych-- Cognition and judgment appear intact. Alert and cooperative with normal attention span and concentration.  not anxious appearing and not depressed appearing.      Assessment & Plan:

## 2011-10-16 NOTE — Assessment & Plan Note (Signed)
Started Inderal around 07-2011, has been titrated up at least once. BP needs better control, she also complained of fatigue which could be from beta blockers or RA. BMP  reportedly normal to-2013 Plan: Change to metoprolol, amlodipine.

## 2011-10-16 NOTE — Patient Instructions (Signed)
Check the  blood pressure 2 or 3 times a week, be sure it is between 110/60 and 140/85. If it is consistently higher or lower, let me know  

## 2011-10-16 NOTE — Assessment & Plan Note (Addendum)
New problem, abdominal bruit , palpable but nontender aorta. Plan: Ultrasound of aorta---> r/o AAA and renal arteries.

## 2011-10-17 NOTE — Progress Notes (Signed)
Addended by: Maurice Small on: 10/17/2011 09:11 AM   Modules accepted: Orders

## 2011-10-22 ENCOUNTER — Ambulatory Visit
Admission: RE | Admit: 2011-10-22 | Discharge: 2011-10-22 | Disposition: A | Discharge status: Home / Self Care | Payer: Medicare Other | Source: Ambulatory Visit | Attending: Internal Medicine | Admitting: Internal Medicine

## 2011-10-22 DIAGNOSIS — I714 Abdominal aortic aneurysm, without rupture: Secondary | ICD-10-CM

## 2011-10-22 DIAGNOSIS — R0989 Other specified symptoms and signs involving the circulatory and respiratory systems: Secondary | ICD-10-CM

## 2011-10-22 DIAGNOSIS — I10 Essential (primary) hypertension: Secondary | ICD-10-CM

## 2011-10-30 ENCOUNTER — Telehealth: Payer: Self-pay | Admitting: *Deleted

## 2011-10-30 NOTE — Telephone Encounter (Signed)
Dr.Hopper please advise 

## 2011-10-30 NOTE — Telephone Encounter (Signed)
Per Dr.Hopper have patient d/c amlodipine and monitor B/P readings, schedule f/u appointment in 5-7 days.  Schedule patient an appointment for next Thursday 11/07/11, patient instructed to call if B/P 135/85 on average while holding amlodipine, patient verbalized understanding

## 2011-10-30 NOTE — Telephone Encounter (Signed)
Patient called and let voice message stating she was started on 2 blood pressure medication and she is now having swelling in her ankles and legs. Onset this morning, she started blood pressure medication 2 weeks ago amlodipine 5 mg  qd , metoprolol 25 bid. She states her hands are falling asleep, and there is no pain with swelling. Elevation in the past 2 hours with little relief. Patient denies swelling of tongue or throat, she denies difficulty breathing, denies nausea, denies vomiting, denies heartburn and indigestion, denies rash or itching. She would like to know what Dr Alwyn Ren advises.

## 2011-11-07 ENCOUNTER — Encounter: Payer: Self-pay | Admitting: Internal Medicine

## 2011-11-07 ENCOUNTER — Ambulatory Visit (INDEPENDENT_AMBULATORY_CARE_PROVIDER_SITE_OTHER): Payer: Medicare Other | Admitting: Internal Medicine

## 2011-11-07 VITALS — BP 130/82 | HR 57 | Temp 98.5°F | Wt 144.2 lb

## 2011-11-07 DIAGNOSIS — I10 Essential (primary) hypertension: Secondary | ICD-10-CM

## 2011-11-07 DIAGNOSIS — M1990 Unspecified osteoarthritis, unspecified site: Secondary | ICD-10-CM | POA: Insufficient documentation

## 2011-11-07 DIAGNOSIS — R7309 Other abnormal glucose: Secondary | ICD-10-CM

## 2011-11-07 DIAGNOSIS — M069 Rheumatoid arthritis, unspecified: Secondary | ICD-10-CM

## 2011-11-07 DIAGNOSIS — R350 Frequency of micturition: Secondary | ICD-10-CM

## 2011-11-07 DIAGNOSIS — IMO0001 Reserved for inherently not codable concepts without codable children: Secondary | ICD-10-CM

## 2011-11-07 LAB — POCT URINALYSIS DIPSTICK
Bilirubin, UA: NEGATIVE
Blood, UA: NEGATIVE
Glucose, UA: NEGATIVE
Ketones, UA: NEGATIVE
Leukocytes, UA: NEGATIVE
Nitrite, UA: NEGATIVE
Protein, UA: NEGATIVE
Spec Grav, UA: <=1.005
Urobilinogen, UA: 0.2
pH, UA: 5

## 2011-11-07 LAB — HEMOGLOBIN A1C: Hgb A1c MFr Bld: 5.6 % (ref 4.6–6.5)

## 2011-11-07 NOTE — Patient Instructions (Signed)
Blood Pressure Goal  Ideally is an AVERAGE < 135/85. This AVERAGE should be calculated from @ least 5-7 BP readings taken @ different times of day on different days of week. You should not respond to isolated BP readings , but rather the AVERAGE for that week. If your arthritis pain is not controlled; it would be difficult to manage blood pressure. Increase roughage in your diet and use Metamucil or other such agents as needed for stool elemination. Complete the cleansing with a Tucks or Baby Wipe.  Please try to go on My Chart within the next 24 hours to allow me to release the results directly to you.

## 2011-11-07 NOTE — Progress Notes (Signed)
  Subjective:    Patient ID: Felicia [name], female    DOB: November 25, 1940, 71 y.o.   MRN: [id number]  HPI 10/30/11 amlodipine was discontinued by phone because of edema. Since that time her blood pressures range from 112/55-147/86. The edema has resolved of amlodipine and she generally feels better. Her arthritis has flared significantly with associated pain for which tramadol as prescribed by her rheumatologist. This has been of benefit. Labs performed at that office for/18/13 were reviewed. She has a mild anemia with hematocrit 34.9. Differential was normal. Creatinine was 0.76. AST and ALT were normal    Review of Systems  Chest pain: no   Dyspnea:no  Claudication: no Medication compliance: yes Medication Side Effects  Lightheadedness: no  Urinary frequency: x 1 year , worse recently especially in am. She denies hematuria, dysuria, or pyuria. Nocturia is variable but can occur up to 4 times a night. She did have a gynecologic exam earlier this year; no pathologic findings were noted. Her glucose was 103 on 08/05/11; there is no family history of diabetes     Preventitive Healthcare:  Exercise: doing PT exercises  Diet Pattern: low fat/sugar  Salt Restriction: yes  She has incomplete elimination of stool       Objective:   Physical Exam She appears healthy and well-nourished;she is in no acute distress.Appears younger than stated age  There is no lymphadenopathy about the neck or axilla  No carotid bruits are present.  Heart rhythm and rate are normal with no significant murmurs or gallops.  Chest is clear with no increased work of breathing  There is an aortic bruit but no evidence of aortic aneurysm . No renal artery bruits  She has no organomegaly or masses. There is no abdominal tenderness   She has no clubbing or edema.   Pedal pulses are intact   No ischemic skin changes are present         Assessment & Plan:  #1 edema due to amlodipine; resolved with  discontinuation medicine.  #2 hypertension, control appears to be good.  #3 arthritis flare; this will have an impact on #2  #4 frequency  #5 mild hyperglycemia  Plan: See orders and recommendations

## 2011-11-13 ENCOUNTER — Other Ambulatory Visit: Payer: Self-pay | Admitting: Gastroenterology

## 2011-11-14 ENCOUNTER — Ambulatory Visit: Payer: Medicare Other | Admitting: Internal Medicine

## 2011-12-11 ENCOUNTER — Other Ambulatory Visit: Payer: Self-pay | Admitting: Internal Medicine

## 2011-12-11 NOTE — Telephone Encounter (Signed)
Refill done.  

## 2012-01-29 ENCOUNTER — Other Ambulatory Visit: Payer: Self-pay | Admitting: Obstetrics and Gynecology

## 2012-02-12 ENCOUNTER — Other Ambulatory Visit: Payer: Self-pay | Admitting: Internal Medicine

## 2012-02-14 ENCOUNTER — Other Ambulatory Visit: Payer: Self-pay | Admitting: Orthopaedic Surgery

## 2012-02-14 DIAGNOSIS — M25512 Pain in left shoulder: Secondary | ICD-10-CM

## 2012-02-14 DIAGNOSIS — H401290 Low-tension glaucoma, unspecified eye, stage unspecified: Secondary | ICD-10-CM | POA: Insufficient documentation

## 2012-02-14 DIAGNOSIS — H401233 Low-tension glaucoma, bilateral, severe stage: Secondary | ICD-10-CM | POA: Insufficient documentation

## 2012-02-20 ENCOUNTER — Ambulatory Visit
Admission: RE | Admit: 2012-02-20 | Discharge: 2012-02-20 | Disposition: A | Discharge status: Home / Self Care | Payer: Medicare Other | Source: Ambulatory Visit | Attending: Orthopaedic Surgery | Admitting: Orthopaedic Surgery

## 2012-02-20 DIAGNOSIS — M25512 Pain in left shoulder: Secondary | ICD-10-CM

## 2012-03-10 ENCOUNTER — Ambulatory Visit (INDEPENDENT_AMBULATORY_CARE_PROVIDER_SITE_OTHER): Payer: Medicare Other

## 2012-03-10 DIAGNOSIS — Z111 Encounter for screening for respiratory tuberculosis: Secondary | ICD-10-CM

## 2012-03-12 LAB — TB SKIN TEST
Induration: 0 mm
TB Skin Test: NEGATIVE

## 2012-06-04 ENCOUNTER — Encounter: Payer: Self-pay | Admitting: Internal Medicine

## 2012-06-10 ENCOUNTER — Encounter: Payer: Medicare Other | Admitting: Internal Medicine

## 2012-06-11 ENCOUNTER — Encounter (HOSPITAL_COMMUNITY): Payer: Self-pay | Admitting: Pharmacist

## 2012-06-18 ENCOUNTER — Other Ambulatory Visit: Payer: Self-pay

## 2012-06-18 ENCOUNTER — Encounter (HOSPITAL_COMMUNITY)
Admission: RE | Admit: 2012-06-18 | Discharge: 2012-06-18 | Disposition: A | Discharge status: Home / Self Care | Payer: Medicare Other | Source: Ambulatory Visit | Attending: Obstetrics and Gynecology | Admitting: Obstetrics and Gynecology

## 2012-06-18 ENCOUNTER — Encounter (HOSPITAL_COMMUNITY): Payer: Self-pay

## 2012-06-18 ENCOUNTER — Other Ambulatory Visit: Payer: Self-pay | Admitting: Obstetrics and Gynecology

## 2012-06-18 HISTORY — DX: Other complications of anesthesia, initial encounter: T88.59XA

## 2012-06-18 HISTORY — DX: Adverse effect of unspecified anesthetic, initial encounter: T41.45XA

## 2012-06-18 LAB — CBC
HCT: 36.5 % (ref 36.0–46.0)
Hemoglobin: 12.3 g/dL (ref 12.0–15.0)
MCH: 30.6 pg (ref 26.0–34.0)
MCHC: 33.7 g/dL (ref 30.0–36.0)
MCV: 90.8 fL (ref 78.0–100.0)
Platelets: 488 10*3/uL — ABNORMAL HIGH (ref 150–400)
RBC: 4.02 MIL/uL (ref 3.87–5.11)
RDW: 13.8 % (ref 11.5–15.5)
WBC: 8.6 10*3/uL (ref 4.0–10.5)

## 2012-06-18 NOTE — Patient Instructions (Addendum)
   Your procedure is scheduled on:Wednesday January 22nd  Enter through the [employer] of [facility] at:10am Pick up the phone at the desk and dial [phone] and inform us of your arrival.  Please call this number if you have any problems the morning of surgery: [phone]  Remember: Do not eat food after midnight on Tuesday You may have water until 730am on Wednesday Please take your morning medications- metoprolol, nexium with sips of water  Do not wear jewelry, make-up, or FINGER nail polish No metal in your hair or on your body. Do not wear lotions, powders, perfumes. You may wear deodorant.  Please use your CHG wash as directed prior to surgery.  Do not shave anywhere for at least 12 hours prior to first CHG shower.  Do not bring valuables to the hospital. Please bring a case for your eyeglasses to protect them while you are in surgery.    Patients discharged on the day of surgery will not be allowed to drive home.

## 2012-06-23 NOTE — H&P (Signed)
72 y.o. yo complains of persistent PMP bleeding.  In office emb was negative but bleeding persists off and on.  Pt is on HRT and reluctant to change since she is finally settled on RA meds.  She desires definitive dx.  Past Medical History  Diagnosis Date  . GERD (gastroesophageal reflux disease)   . Rheumatoid arthritis   . Glaucoma(365)   . Hypertension   . Complication of anesthesia     small airway per patient   Past Surgical History  Procedure Date  . Cataract extraction, bilateral   . Trabeculectomy     X 2 OD; X 1 OS  . Upper gastrointestinal endoscopy     hiatal hernia  . Colonoscopy w/ polypectomy     X1  . Dilation and curettage of uterus     History   Social History  . Marital Status: Single    Spouse Name: N/A    Number of Children: N/A  . Years of Education: N/A   Occupational History  . Not on file.   Social History Main Topics  . Smoking status: Former Smoker    Quit date: 06/04/1971  . Smokeless tobacco: Not on file     Comment: smoked about 5-6 years   . Alcohol Use: No  . Drug Use: No  . Sexually Active: Not on file   Other Topics Concern  . Not on file   Social History Narrative  . No narrative on file    No current facility-administered medications on file prior to encounter.   Current Outpatient Prescriptions on File Prior to Encounter  Medication Sig Dispense Refill  . abatacept (ORENCIA) 250 MG injection Inject 125 mg into the vein every 7 (seven) days.      Marland Kitchen aspirin 81 MG tablet Take 81 mg by mouth daily.       . Calcium Carbonate-Vitamin D (CALTRATE 600+D) 600-400 MG-UNIT per tablet Take 1 tablet by mouth daily.      . Carboxymethylcellulose Sodium (REFRESH OP) Apply to eye as needed.       . Cholecalciferol (VITAMIN D3) 1000 UNITS CAPS Take by mouth daily.      Marland Kitchen esomeprazole (NEXIUM) 40 MG capsule Take 40 mg by mouth 2 (two) times daily.       Marland Kitchen estrogens, conjugated, (PREMARIN) 0.625 MG tablet Take 0.625 mg by mouth every  evening.       . folic acid (FOLVITE) 1 MG tablet Take 2 mg by mouth daily.      . Iron TABS Take 1 tablet by mouth daily.      [place] Lanius Omega-3 300 MG CAPS Take 1 capsule by mouth daily.      Marland Kitchen latanoprost (XALATAN) 0.005 % ophthalmic solution Place 1 drop into both eyes at bedtime.       . METHOTREXATE SODIUM IJ Inject 25 mg as directed once a week. Wednesday      . metoprolol tartrate (LOPRESSOR) 25 MG tablet take 1 tablet by mouth twice a day  60 tablet  5  . metroNIDAZOLE (METROGEL) 1 % gel Apply 1 application topically every evening.       Marland Kitchen NEXIUM 40 MG capsule take 1 capsule by mouth twice a day  60 capsule  11  . Probiotic Product (ALIGN PO) Take 1 tablet by mouth every evening.      . progesterone (PROMETRIUM) 100 MG capsule Take 100 mg by mouth every evening.       . timolol (BETIMOL) 0.5 %  ophthalmic solution Place 1 drop into the right eye daily.        . traMADol (ULTRAM) 50 MG tablet Take 50 mg by mouth at bedtime as needed.         Allergies  Allergen Reactions  . Sulfonamide Derivatives     Rash, redness  . Norvasc (Amlodipine Besylate)     edema    @VITALS2 @  Lungs: clear to ascultation Cor:  RRR Abdomen:  soft, nontender, nondistended. Ex:  no cords, erythema Pelvic:  nml s/s, no masses  U/S 6x3.5x4, EM 8 mm.  Ov not seen, 2 lipomyomas, 3 and 1 cm seen pedunculated.  A:  Persistent PMP bleeding.   P:  All risks, benefits and alternatives d/w patient and she desires to proceed with a d&c, hysteroscopy .  Patient will receive preop antibiotics and SCDs during the operation secondary RA drugs.  I will give pt 3 days of doxycycline to take at home to prevent infection.     Elimelech Houseman A

## 2012-06-24 ENCOUNTER — Ambulatory Visit (HOSPITAL_COMMUNITY)
Admission: RE | Admit: 2012-06-24 | Discharge: 2012-06-24 | Disposition: A | Discharge status: Home / Self Care | Payer: Medicare Other | Source: Ambulatory Visit | Attending: Obstetrics and Gynecology | Admitting: Obstetrics and Gynecology

## 2012-06-24 ENCOUNTER — Encounter (HOSPITAL_COMMUNITY): Payer: Self-pay | Admitting: *Deleted

## 2012-06-24 ENCOUNTER — Encounter (HOSPITAL_COMMUNITY): Payer: Self-pay | Admitting: Registered Nurse

## 2012-06-24 ENCOUNTER — Ambulatory Visit (HOSPITAL_COMMUNITY): Payer: Medicare Other | Admitting: Registered Nurse

## 2012-06-24 ENCOUNTER — Encounter (HOSPITAL_COMMUNITY)
Admission: RE | Disposition: A | Discharge status: Home / Self Care | Payer: Self-pay | Source: Ambulatory Visit | Attending: Obstetrics and Gynecology

## 2012-06-24 DIAGNOSIS — N950 Postmenopausal bleeding: Secondary | ICD-10-CM

## 2012-06-24 DIAGNOSIS — K219 Gastro-esophageal reflux disease without esophagitis: Secondary | ICD-10-CM | POA: Insufficient documentation

## 2012-06-24 DIAGNOSIS — D250 Submucous leiomyoma of uterus: Secondary | ICD-10-CM | POA: Insufficient documentation

## 2012-06-24 DIAGNOSIS — I10 Essential (primary) hypertension: Secondary | ICD-10-CM | POA: Insufficient documentation

## 2012-06-24 HISTORY — PX: HYSTEROSCOPY WITH D & C: SHX1775

## 2012-06-24 LAB — BASIC METABOLIC PANEL
BUN: 11 mg/dL (ref 6–23)
CO2: 26 mEq/L (ref 19–32)
Calcium: 9.8 mg/dL (ref 8.4–10.5)
Chloride: 99 mEq/L (ref 96–112)
Creatinine, Ser: 0.72 mg/dL (ref 0.50–1.10)
GFR calc Af Amer: >90 mL/min (ref >90)
GFR calc non Af Amer: 85 mL/min — ABNORMAL LOW (ref >90)
Glucose, Bld: 102 mg/dL — ABNORMAL HIGH (ref 70–99)
Potassium: 4.7 mEq/L (ref 3.5–5.1)
Sodium: 134 mEq/L — ABNORMAL LOW (ref 135–145)

## 2012-06-24 SURGERY — DILATATION AND CURETTAGE /HYSTEROSCOPY
Anesthesia: General | Site: Vagina | Wound class: Clean Contaminated

## 2012-06-24 MED ORDER — MIDAZOLAM HCL 5 MG/5ML IJ SOLN
INTRAMUSCULAR | Status: DC | PRN
  Administered 2012-06-24: 2 mg via INTRAVENOUS

## 2012-06-24 MED ORDER — LIDOCAINE HCL (CARDIAC) 20 MG/ML IV SOLN
INTRAVENOUS | Status: AC
  Filled 2012-06-24: qty 5

## 2012-06-24 MED ORDER — LACTATED RINGERS IV SOLN
INTRAVENOUS | Status: DC
Start: 2012-06-24 — End: 2012-06-24
  Administered 2012-06-24: 125 mL/h via INTRAVENOUS
  Administered 2012-06-24: 11:00:00 via INTRAVENOUS

## 2012-06-24 MED ORDER — DEXAMETHASONE SODIUM PHOSPHATE 10 MG/ML IJ SOLN
INTRAMUSCULAR | Status: DC | PRN
  Administered 2012-06-24: 10 mg via INTRAVENOUS

## 2012-06-24 MED ORDER — CEFAZOLIN SODIUM-DEXTROSE 2-3 GM-% IV SOLR
INTRAVENOUS | Status: AC
  Filled 2012-06-24: qty 50

## 2012-06-24 MED ORDER — KETOROLAC TROMETHAMINE 30 MG/ML IJ SOLN: 15.0000 mg | Freq: Once | INTRAMUSCULAR | Status: DC | PRN

## 2012-06-24 MED ORDER — LIDOCAINE HCL (CARDIAC) 20 MG/ML IV SOLN
INTRAVENOUS | Status: DC | PRN
  Administered 2012-06-24: 50 mg via INTRAVENOUS

## 2012-06-24 MED ORDER — KETOROLAC TROMETHAMINE 30 MG/ML IJ SOLN
INTRAMUSCULAR | Status: AC
  Filled 2012-06-24: qty 1

## 2012-06-24 MED ORDER — DOXYCYCLINE HYCLATE 100 MG PO TABS: 100.0000 mg | ORAL_TABLET | Freq: Two times a day (BID) | ORAL | Status: DC

## 2012-06-24 MED ORDER — ONDANSETRON HCL 4 MG/2ML IJ SOLN
INTRAMUSCULAR | Status: AC
  Filled 2012-06-24: qty 2

## 2012-06-24 MED ORDER — ONDANSETRON HCL 4 MG/2ML IJ SOLN
INTRAMUSCULAR | Status: DC | PRN
  Administered 2012-06-24: 4 mg via INTRAVENOUS

## 2012-06-24 MED ORDER — GLYCINE 1.5 % IR SOLN
Status: DC | PRN
  Administered 2012-06-24: 3000 mL

## 2012-06-24 MED ORDER — DEXAMETHASONE SODIUM PHOSPHATE 10 MG/ML IJ SOLN
INTRAMUSCULAR | Status: AC
  Filled 2012-06-24: qty 1

## 2012-06-24 MED ORDER — FENTANYL CITRATE 0.05 MG/ML IJ SOLN: 25.0000 ug | INTRAMUSCULAR | Status: DC | PRN

## 2012-06-24 MED ORDER — FENTANYL CITRATE 0.05 MG/ML IJ SOLN
INTRAMUSCULAR | Status: AC
  Filled 2012-06-24: qty 2

## 2012-06-24 MED ORDER — MIDAZOLAM HCL 2 MG/2ML IJ SOLN
INTRAMUSCULAR | Status: AC
  Filled 2012-06-24: qty 2

## 2012-06-24 MED ORDER — KETOROLAC TROMETHAMINE 30 MG/ML IJ SOLN
INTRAMUSCULAR | Status: DC | PRN
  Administered 2012-06-24: 30 mg via INTRAVENOUS

## 2012-06-24 MED ORDER — CEFAZOLIN SODIUM-DEXTROSE 2-3 GM-% IV SOLR
2.0000 g | INTRAVENOUS | Status: AC
  Administered 2012-06-24: 2 g via INTRAVENOUS

## 2012-06-24 MED ORDER — MEPERIDINE HCL 25 MG/ML IJ SOLN: 6.2500 mg | INTRAMUSCULAR | Status: DC | PRN

## 2012-06-24 MED ORDER — PROPOFOL 10 MG/ML IV EMUL
INTRAVENOUS | Status: AC
  Filled 2012-06-24: qty 20

## 2012-06-24 MED ORDER — PROPOFOL 10 MG/ML IV BOLUS
INTRAVENOUS | Status: DC | PRN
  Administered 2012-06-24: 160 mg via INTRAVENOUS

## 2012-06-24 MED ORDER — FENTANYL CITRATE 0.05 MG/ML IJ SOLN
INTRAMUSCULAR | Status: DC | PRN
  Administered 2012-06-24: 25 ug via INTRAVENOUS
  Administered 2012-06-24: 50 ug via INTRAVENOUS
  Administered 2012-06-24: 25 ug via INTRAVENOUS

## 2012-06-24 MED ORDER — ONDANSETRON HCL 4 MG/2ML IJ SOLN: 4.0000 mg | Freq: Once | INTRAMUSCULAR | Status: DC | PRN

## 2012-06-24 SURGICAL SUPPLY — 15 items
ABLATOR ENDOMETRIAL BIPOLAR (ABLATOR) IMPLANT
CANISTER SUCTION 2500CC (MISCELLANEOUS) ×2 IMPLANT
CATH ROBINSON RED A/P 16FR (CATHETERS) ×2 IMPLANT
CLOTH BEACON ORANGE TIMEOUT ST (SAFETY) ×2 IMPLANT
CONTAINER PREFILL 10% NBF 60ML (FORM) ×4 IMPLANT
DRESSING TELFA 8X3 (GAUZE/BANDAGES/DRESSINGS) ×2 IMPLANT
ELECT REM PT RETURN 9FT ADLT (ELECTROSURGICAL)
ELECTRODE REM PT RTRN 9FT ADLT (ELECTROSURGICAL) IMPLANT
GLOVE BIO SURGEON STRL SZ7 (GLOVE) ×4 IMPLANT
GOWN STRL REIN XL XLG (GOWN DISPOSABLE) ×4 IMPLANT
LOOP ANGLED CUTTING 22FR (CUTTING LOOP) IMPLANT
PACK HYSTEROSCOPY LF (CUSTOM PROCEDURE TRAY) ×2 IMPLANT
PAD OB MATERNITY 4.3X12.25 (PERSONAL CARE ITEMS) ×2 IMPLANT
TOWEL OR 17X24 6PK STRL BLUE (TOWEL DISPOSABLE) ×4 IMPLANT
WATER STERILE IRR 1000ML POUR (IV SOLUTION) ×2 IMPLANT

## 2012-06-24 NOTE — Anesthesia Postprocedure Evaluation (Signed)
Anesthesia Post Note  Patient: Felicia [name]  Procedure(s) Performed: Procedure(s) (LRB): DILATATION AND CURETTAGE /HYSTEROSCOPY (N/A)  Anesthesia type: General  Patient location: PACU  Post pain: Pain level controlled  Post assessment: Post-op Vital signs reviewed  Last Vitals:  Filed Vitals:   06/24/12 1200  BP: 145/65  Pulse: 67  Temp:   Resp: 16    Post vital signs: Reviewed  Level of consciousness: sedated  Complications: No apparent anesthesia complicationsfj

## 2012-06-24 NOTE — Progress Notes (Signed)
There has been no change in the patients history, status or exam since the history and physical.  Filed Vitals:   06/24/12 0919  BP: 115/69  Pulse: 84  Temp: 97.9 F (36.6 C)  TempSrc: Oral  Resp: 18  SpO2: 100%    Lab Results  Component Value Date   WBC 8.6 06/18/2012   HGB 12.3 06/18/2012   HCT 36.5 06/18/2012   MCV 90.8 06/18/2012   PLT 488* 06/18/2012    Aikam Hellickson A

## 2012-06-24 NOTE — Preoperative (Signed)
Beta Blockers   Reason not to administer Beta Blockers:Not Applicable 

## 2012-06-24 NOTE — Transfer of Care (Signed)
Immediate Anesthesia Transfer of Care Note  Patient: Felicia [name]  Procedure(s) Performed: Procedure(s) (LRB) with comments: DILATATION AND CURETTAGE /HYSTEROSCOPY (N/A)  Patient Location: PACU  Anesthesia Type:General  Level of Consciousness: awake, alert  and oriented  Airway & Oxygen Therapy: Patient Spontanous Breathing and Patient connected to nasal cannula oxygen  Post-op Assessment: Report given to PACU RN  Post vital signs: Reviewed  Complications: No apparent anesthesia complications

## 2012-06-24 NOTE — Op Note (Signed)
06/24/2012  11:22 AM  PATIENT:  Felicia [name]  72 y.o. female  PRE-OPERATIVE DIAGNOSIS:  POSTMENOPAUSAL BLEEDING  POST-OPERATIVE DIAGNOSIS:  * No post-op diagnosis entered *  PROCEDURE:  Procedure(s) (LRB) with comments: DILATATION AND CURETTAGE /HYSTEROSCOPY (N/A)  SURGEON:  Surgeon(s) and Role:    * Loney Laurence, MD - Primary   ANESTHESIA:   general  EBL:  Total I/O In: 1000 [I.V.:1000] Out: 200 [Urine:200]   SPECIMEN:  Source of Specimen:  uterine currettings  DISPOSITION OF SPECIMEN:  PATHOLOGY  COUNTS:  YES  TOURNIQUET:  * No tourniquets in log *  DICTATION: .Note written in EPIC  PLAN OF CARE: Discharge to home after PACU  PATIENT DISPOSITION:  PACU - hemodynamically stable.   Delay start of Pharmacological VTE agent (>24hrs) due to surgical blood loss or risk of bleeding: not applicable  Complications: none.  Findings: 2 cm submucosal fibroid, unable to be removed secondary wide base.  Scant endometrium.   After adequate anesthesia was achieved, the patient was prepped and draped in the usual sterile fashion.  The speculum was placed in the vagina and the cervix stabilized with a single-tooth tenaculum.  The cervix was dilated with pratt dilators and the hysteroscope passed inside the endometrial cavity.  The above findings were noted and polyp forceps were used to remove small pieces of tissue.  Sharp curettage was then performed and uterine curettings sent to path.  All instruments were removed from the vagina.  The patient tolerated the procedure well.    [name]

## 2012-06-24 NOTE — Anesthesia Preprocedure Evaluation (Signed)
Anesthesia Evaluation  Patient identified by MRN, date of birth, ID band Patient awake    Reviewed: Allergy & Precautions, H&P , NPO status , Patient's Chart, lab work & pertinent test results, reviewed documented beta blocker date and time   Airway Mallampati: I TM Distance: >3 FB Neck ROM: full    Dental No notable dental hx. (+) Teeth Intact   Pulmonary neg pulmonary ROS,    Pulmonary exam normal       Cardiovascular hypertension, Pt. on home beta blockers     Neuro/Psych negative psych ROS   GI/Hepatic Neg liver ROS, GERD-  Medicated,  Endo/Other  negative endocrine ROS  Renal/GU negative Renal ROS  negative genitourinary   Musculoskeletal  (+) Arthritis -, Rheumatoid disorders,    Abdominal Normal abdominal exam  (+)   Peds negative pediatric ROS (+)  Hematology negative hematology ROS (+)   Anesthesia Other Findings   Reproductive/Obstetrics negative OB ROS                           Anesthesia Physical Anesthesia Plan  ASA: II  Anesthesia Plan: General   Post-op Pain Management:    Induction: Intravenous  Airway Management Planned: LMA  Additional Equipment:   Intra-op Plan:   Post-operative Plan:   Informed Consent: I have reviewed the patients History and Physical, chart, labs and discussed the procedure including the risks, benefits and alternatives for the proposed anesthesia with the patient or authorized representative who has indicated his/her understanding and acceptance.     Plan Discussed with: CRNA and Surgeon  Anesthesia Plan Comments:         Anesthesia Quick Evaluation

## 2012-06-24 NOTE — Brief Op Note (Signed)
06/24/2012  11:22 AM  PATIENT:  Felicia [name]  71 y.o. female  PRE-OPERATIVE DIAGNOSIS:  POSTMENOPAUSAL BLEEDING  POST-OPERATIVE DIAGNOSIS:  * No post-op diagnosis entered *  PROCEDURE:  Procedure(s) (LRB) with comments: DILATATION AND CURETTAGE /HYSTEROSCOPY (N/A)  SURGEON:  Surgeon(s) and Role:    * Ola Fawver A Kysa Calais, MD - Primary   ANESTHESIA:   general  EBL:  Total I/O In: 1000 [I.V.:1000] Out: 200 [Urine:200]   SPECIMEN:  Source of Specimen:  uterine currettings  DISPOSITION OF SPECIMEN:  PATHOLOGY  COUNTS:  YES  TOURNIQUET:  * No tourniquets in log *  DICTATION: .Note written in EPIC  PLAN OF CARE: Discharge to home after PACU  PATIENT DISPOSITION:  PACU - hemodynamically stable.   Delay start of Pharmacological VTE agent (>24hrs) due to surgical blood loss or risk of bleeding: not applicable  Complications: none.  Findings: 2 cm submucosal fibroid, unable to be removed secondary wide base.  Scant endometrium.   After adequate anesthesia was achieved, the patient was prepped and draped in the usual sterile fashion.  The speculum was placed in the vagina and the cervix stabilized with a single-tooth tenaculum.  The cervix was dilated with pratt dilators and the hysteroscope passed inside the endometrial cavity.  The above findings were noted and polyp forceps were used to remove small pieces of tissue.  Sharp curettage was then performed and uterine curettings sent to path.  All instruments were removed from the vagina.  The patient tolerated the procedure well.    Elizet Kaplan A    

## 2012-06-25 ENCOUNTER — Encounter (HOSPITAL_COMMUNITY): Payer: Self-pay | Admitting: Obstetrics and Gynecology

## 2012-07-18 ENCOUNTER — Other Ambulatory Visit: Payer: Self-pay

## 2012-07-20 ENCOUNTER — Encounter: Payer: Self-pay | Admitting: Internal Medicine

## 2012-07-22 ENCOUNTER — Ambulatory Visit (INDEPENDENT_AMBULATORY_CARE_PROVIDER_SITE_OTHER): Payer: Medicare Other | Admitting: Family Medicine

## 2012-07-22 ENCOUNTER — Telehealth: Payer: Self-pay | Admitting: Internal Medicine

## 2012-07-22 ENCOUNTER — Encounter: Payer: Self-pay | Admitting: Family Medicine

## 2012-07-22 VITALS — BP 146/86 | HR 73 | Temp 98.0°F | Wt 135.2 lb

## 2012-07-22 DIAGNOSIS — J029 Acute pharyngitis, unspecified: Secondary | ICD-10-CM

## 2012-07-22 DIAGNOSIS — J020 Streptococcal pharyngitis: Secondary | ICD-10-CM

## 2012-07-22 LAB — POCT RAPID STREP A (OFFICE): Rapid Strep A Screen: NEGATIVE

## 2012-07-22 MED ORDER — AMOXICILLIN 875 MG PO TABS: 875.0000 mg | ORAL_TABLET | Freq: Two times a day (BID) | ORAL | Status: DC

## 2012-07-22 MED ORDER — MOMETASONE FUROATE 50 MCG/ACT NA SUSP: 2.0000 | Freq: Every day | NASAL | Status: DC

## 2012-07-22 NOTE — Progress Notes (Signed)
  Subjective:     Felicia [name] is a 72 y.o. female who presents for evaluation of sore throat. Associated symptoms include pain while swallowing, post nasal drip, sinus and nasal congestion, sore throat and swollen glands. Onset of symptoms was 6 days ago, and have been gradually worsening since that time. She is drinking plenty of fluids. She has not had a recent close exposure to someone with proven streptococcal pharyngitis.  The following portions of the patient's history were reviewed and updated as appropriate: allergies, current medications, past family history, past medical history, past social history, past surgical history and problem list.  Review of Systems Pertinent items are noted in HPI.    Objective:    BP 146/86  Pulse 73  Temp(Src) 98 F (36.7 C) (Oral)  Wt 135 lb 3.2 oz (61.326 kg)  BMI 21.83 kg/m2  SpO2 97% General appearance: alert, cooperative, appears stated age and no distress Ears: normal TM's and external ear canals both ears Nose: green discharge, mild congestion, turbinates red, swollen, sinus tenderness bilateral Throat: abnormal findings: mild oropharyngeal erythema Neck: mild anterior cervical adenopathy, supple, symmetrical, trachea midline and thyroid not enlarged, symmetric, no tenderness/mass/nodules Lungs: clear to auscultation bilaterally Heart: S1, S2 normal  Laboratory Strep test done. Results:negative.    Assessment:    Acute pharyngitis -.    Plan:    Patient placed on antibiotics. Use of OTC analgesics recommended as well as salt water gargles. Follow up as needed.   Pt knows to hold her RA meds

## 2012-07-22 NOTE — Patient Instructions (Signed)

## 2012-07-22 NOTE — Telephone Encounter (Signed)
Appointment Scheduled:  07/22/2012 11:30:00  Appointment Scheduled Provider:  Lelon Perla

## 2012-07-22 NOTE — Telephone Encounter (Signed)
Patient Information:  Caller Name: [place]  Phone: [phone]  Patient: Felicia [name], Felicia [name]  Gender: Female  DOB: [birth year]  Age: 72 Years  PCP: Marga Melnick  Office Follow Up:  Does the office need to follow up with this patient?: No  Instructions For The Office: N/A   Symptoms  Reason For Call & Symptoms: Sore throat, dry cough, nasal congestion at times.  Reviewed Health History In EMR: Yes  Reviewed Medications In EMR: Yes  Reviewed Allergies In EMR: Yes  Reviewed Surgeries / Procedures: Yes  Date of Onset of Symptoms: 07/19/2012  Treatments Tried: Robitussin DM, Tylenol Cold Multi Symptom, gargled with salt water, drinking warm tea  Treatments Tried Worked: Yes  Guideline(s) Used:  Sore Throat  Disposition Per Guideline:   See Today in Office  Reason For Disposition Reached:   Severe sore throat pain  Advice Given:  Call Back If:  You become worse.  Appointment Scheduled:  07/22/2012 11:30:00 Appointment Scheduled Provider:  Lelon Perla

## 2012-07-26 ENCOUNTER — Encounter: Payer: Self-pay | Admitting: Internal Medicine

## 2012-07-27 ENCOUNTER — Encounter: Payer: Self-pay | Admitting: Internal Medicine

## 2012-07-27 ENCOUNTER — Ambulatory Visit (INDEPENDENT_AMBULATORY_CARE_PROVIDER_SITE_OTHER): Payer: Medicare Other | Admitting: Internal Medicine

## 2012-07-27 VITALS — BP 128/80 | HR 75 | Temp 98.1°F | Resp 14 | Ht 66.0 in | Wt 136.0 lb

## 2012-07-27 DIAGNOSIS — Z0000 Encounter for general adult medical examination without abnormal findings: Secondary | ICD-10-CM

## 2012-07-27 DIAGNOSIS — I10 Essential (primary) hypertension: Secondary | ICD-10-CM

## 2012-07-27 DIAGNOSIS — E785 Hyperlipidemia, unspecified: Secondary | ICD-10-CM

## 2012-07-27 MED ORDER — METOPROLOL TARTRATE 25 MG PO TABS: ORAL_TABLET | ORAL | Status: DC

## 2012-07-27 NOTE — Patient Instructions (Addendum)
Preventive Health Care: Exercise  30-45  minutes a day, 3-4 days a week. Walking is especially valuable in preventing Osteoporosis. Eat a low-fat diet with lots of fruits and vegetables, up to 7-9 servings per day. This would eliminate need for vitamin supplements for most individuals. Minimal Blood Pressure Goal= AVERAGE < 140/90;  Ideal is an AVERAGE < 135/85. This AVERAGE should be calculated from @ least 5-7 BP readings taken @ different times of day on different days of week. You should not respond to isolated BP readings , but rather the AVERAGE for that week .Please bring your  blood pressure cuff to office visits to verify that it is reliable.It  can also be checked against the blood pressure device at the pharmacy. Finger or wrist cuffs are not dependable; an arm cuff is. Review and correct the record as indicated. Please share record with all medical staff seen.  Please  schedule fasting Labs : Lipids.PLEASE BRING THESE INSTRUCTIONS TO FOLLOW UP  LAB APPOINTMENT.This will guarantee correct labs are drawn, eliminating need for repeat blood sampling ( needle sticks ! ). Diagnoses /Codes: 272.4.

## 2012-07-27 NOTE — Progress Notes (Signed)
Subjective:    Patient ID: Felicia [name], female    DOB: 1940/11/06, 72 y.o.   MRN: [id number]  HPI Medicare Wellness Visit:  Psychosocial & medical history were reviewed as required by Medicare (abuse,antisocial behavioral risks,firearm risk).  Social history: caffeine:2-3 cups/ day of coffee  , alcohol: no  ,  tobacco quit 1973:    Exercise : see below   No home & personal  safety / fall risk Activities of daily living: no limitations except fatigue; she takes nap daily. Seatbelt  and smoke alarm employed. Power of Attorney/Living Will status : in place Ophthalmology exam current Hearing evaluation not current Orientation :oriented X 3  Memory & recall :good Spelling  testing:good Mood & affect : normal . Depression / anxiety: denied except anxiety about RA Travel history : last 1968 [place]  Immunization status :can't take Shingles .Flu/ PNA/ tetanus update Transfusion history:  none  Preventive health surveillance ( colonoscopies, BMD , mammograms,PAP as per protocol/ SOC):all current . Dental care:  Every 6 mos. Chart reviewed &  Updated. Active issues reviewed & addressed.      Review of Systems CHRONIC HYPERTENSION follow-up: Home blood pressure range 127-140/70-80. Patient is compliant with medications. No adverse effects noted from medication. Exercise program as walking 2-3  times per week for 60  Minutes. Dietary program heart healthy & low-salt diet.  No exertional chest pain, palpitations, dyspnea, claudication,edema or paroxysmal nocturnal dyspnea described. No significant lightheadedness, headache, epistaxis, or syncope.  Lab studies completed 06/11/12 were reviewed. She has a mild anemia with hematocrit 35.9. Liver function, TSH, and vitamin D levels were all normal. Creatinine was 0.72.        Objective:   Physical Exam Gen.: Healthy and well-nourished in appearance. Alert, appropriate and cooperative throughout exam. Appears younger than stated age   Head: Normocephalic without obvious abnormalities.  Eyes: No corneal or conjunctival inflammation noted. Pupils unequal ; iridectomy op scar OD.  Extraocular motion intact. Vision grossly normal with lenses Ears: External  ear exam reveals no significant lesions or deformities. Canals clear .TMs normal. Hearing is grossly normal bilaterally. Nose: External nasal exam reveals no deformity or inflammation. Nasal mucosa are pink and moist. No lesions or exudates noted.   Mouth: Oral mucosa and oropharynx reveal no lesions or exudates. Teeth in good repair. Neck: No deformities, masses, or tenderness noted. Range of motion slightly decreased. Thyroid normal Lungs: Normal respiratory effort; chest expands symmetrically. Lungs are clear to auscultation without rales, wheezes, or increased work of breathing. Heart: Normal rate and rhythm. Normal S1 and S2. No gallop, click, or rub. S4 with slurring; no significant murmur. Abdomen: Bowel sounds normal; abdomen soft and nontender. No masses, organomegaly or hernias noted. Genitalia: As per Gyn                                  Musculoskeletal/extremities: No deformity or scoliosis noted of  the thoracic or lumbar spine.  There is slight asymmetry of the posterior thoracic musculature suggesting occult scoliosis. No clubbing, cyanosis, edema, or significant extremity  deformity noted. Range of motion normal .Tone & strength  Normal. Joints  reveal isolated  DJD DIP changes. Nail health good. Able to lie down & sit up w/o help. Negative SLR bilaterally Vascular: Carotid, radial artery, dorsalis pedis and  posterior tibial pulses are full and equal. No bruits present. Neurologic: Alert and oriented x3. Deep tendon reflexes symmetrical and normal.  Skin: Intact without suspicious lesions or rashes. Lymph: No cervical, axillary lymphadenopathy present. Psych: Mood and affect are normal. Normally interactive                                                                                         Assessment & Plan:  #1 Medicare Wellness Exam; criteria met ; data entered #2 Problem List reviewed ; Assessment/ Recommendations made Plan: see Orders

## 2012-07-27 NOTE — Addendum Note (Signed)
Addended by: Maurice Small on: 07/27/2012 11:28 AM   Modules accepted: Orders

## 2012-07-27 NOTE — Telephone Encounter (Addendum)
I put a note in where it recommends that you update shingles vaccine, as far as the colonoscopy goes, your GI doctor should update that in the Health Maintenance screen once you have colonoscopy. S.Chrae B/CMA (Dr.Hopper's assistant)

## 2012-09-02 ENCOUNTER — Other Ambulatory Visit (INDEPENDENT_AMBULATORY_CARE_PROVIDER_SITE_OTHER): Payer: Medicare Other

## 2012-09-02 DIAGNOSIS — E785 Hyperlipidemia, unspecified: Secondary | ICD-10-CM

## 2012-09-02 LAB — LIPID PANEL
Cholesterol: 200 mg/dL (ref 0–200)
HDL: 69.7 mg/dL (ref >39.00)
LDL Cholesterol: 110 mg/dL — ABNORMAL HIGH (ref 0–99)
Total CHOL/HDL Ratio: 3
Triglycerides: 104 mg/dL (ref 0.0–149.0)
VLDL: 20.8 mg/dL (ref 0.0–40.0)

## 2012-11-23 ENCOUNTER — Telehealth: Payer: Self-pay | Admitting: Gastroenterology

## 2012-11-23 MED ORDER — ESOMEPRAZOLE MAGNESIUM 40 MG PO CPDR: 40.0000 mg | DELAYED_RELEASE_CAPSULE | Freq: Two times a day (BID) | ORAL | Status: DC

## 2012-11-23 NOTE — Telephone Encounter (Signed)
Patient needed refill for Nexium and wanted to know if its ok if she keeps getting RX because Nexium is over the counter  I advised patient that she needs a follow up visit last seen Dr. Jarold Motto in 2011 Patient is scheduled for 01-12-13 at 845 Advised patient that she has to keep appointment for further refills Patient verbalized understanding

## 2012-12-17 ENCOUNTER — Other Ambulatory Visit: Payer: Self-pay | Admitting: Dermatology

## 2012-12-22 ENCOUNTER — Other Ambulatory Visit: Payer: Self-pay | Admitting: Gastroenterology

## 2012-12-23 ENCOUNTER — Other Ambulatory Visit: Payer: Self-pay

## 2012-12-23 MED ORDER — ESOMEPRAZOLE MAGNESIUM 40 MG PO CPDR: 40.0000 mg | DELAYED_RELEASE_CAPSULE | Freq: Two times a day (BID) | ORAL | Status: DC

## 2012-12-23 NOTE — Telephone Encounter (Signed)
Patient has appointment for August.

## 2013-01-06 ENCOUNTER — Encounter: Payer: Self-pay | Admitting: *Deleted

## 2013-01-12 ENCOUNTER — Ambulatory Visit: Payer: 59 | Admitting: Gastroenterology

## 2013-01-19 ENCOUNTER — Encounter: Payer: Self-pay | Admitting: Gastroenterology

## 2013-01-19 ENCOUNTER — Ambulatory Visit (INDEPENDENT_AMBULATORY_CARE_PROVIDER_SITE_OTHER): Payer: Medicare Other | Admitting: Gastroenterology

## 2013-01-19 VITALS — BP 100/60 | HR 72 | Ht 66.0 in | Wt 140.4 lb

## 2013-01-19 DIAGNOSIS — K219 Gastro-esophageal reflux disease without esophagitis: Secondary | ICD-10-CM

## 2013-01-19 DIAGNOSIS — R0609 Other forms of dyspnea: Secondary | ICD-10-CM

## 2013-01-19 DIAGNOSIS — R0989 Other specified symptoms and signs involving the circulatory and respiratory systems: Secondary | ICD-10-CM

## 2013-01-19 DIAGNOSIS — R0683 Snoring: Secondary | ICD-10-CM

## 2013-01-19 MED ORDER — ESOMEPRAZOLE MAGNESIUM 40 MG PO CPDR: 40.0000 mg | DELAYED_RELEASE_CAPSULE | Freq: Two times a day (BID) | ORAL | Status: DC

## 2013-01-19 NOTE — Patient Instructions (Addendum)
You have an appointment with Dr. Shelle Iron withPulmonology on 02-03-2013 at 10:30 am. They are located on the second floor of this building. Please be there fifteen minutes early for registration.  New prescription for Nexium was sent to your pharmacy.  Please follow up with Dr. Jarold Motto in one year or sooner if symptoms reoccur.

## 2013-01-19 NOTE — Progress Notes (Signed)
History of Present Illness: This is a very nice 72 year old Caucasian female who is had years of acid reflux.  She currently is well controlled on Nexium 40 mg twice a day and denies any acid reflux symptoms.  She does have what appears to be sleep apnea with snoring and some trouble breathing at night.  She denies any other gastrointestinal, or hepatobiliary problems.  Her appetite is good her weight is stable.  She does have rheumatoid arthritis and is on multiple medications.    Current Medications, Allergies, Past Medical History, Past Surgical History, Family History and Social History were reviewed in [employer] record.  ROS: All systems were reviewed and are negative unless otherwise stated in the HPI.         Assessment and plan: Well controlled GERD with twice a day Nexium which I have renewed.  I have set her up to see Dr. Marcelyn Bruins in pulmonary for sleep apnea evaluation.  CC DR Marcelyn Bruins

## 2013-01-21 ENCOUNTER — Other Ambulatory Visit: Payer: Self-pay | Admitting: Gastroenterology

## 2013-02-03 ENCOUNTER — Ambulatory Visit (INDEPENDENT_AMBULATORY_CARE_PROVIDER_SITE_OTHER): Payer: Medicare Other | Admitting: Pulmonary Disease

## 2013-02-03 ENCOUNTER — Encounter: Payer: Self-pay | Admitting: Pulmonary Disease

## 2013-02-03 VITALS — BP 138/80 | HR 64 | Temp 97.8°F | Ht 66.0 in | Wt 140.2 lb

## 2013-02-03 DIAGNOSIS — R0609 Other forms of dyspnea: Secondary | ICD-10-CM

## 2013-02-03 DIAGNOSIS — R0683 Snoring: Secondary | ICD-10-CM | POA: Insufficient documentation

## 2013-02-03 DIAGNOSIS — R0989 Other specified symptoms and signs involving the circulatory and respiratory systems: Secondary | ICD-10-CM

## 2013-02-03 NOTE — Patient Instructions (Addendum)
Will schedule for home sleep testing, and will call once results are available.  

## 2013-02-03 NOTE — Progress Notes (Signed)
Subjective:    Patient ID: Felicia [name], female    DOB: [birth year], 72 y.o.   MRN: [id number]  HPI The patient is a 72 year old female who I have been asked to see for possible obstructive sleep apnea.  She has been noted to have snoring by friends and family, but unsure if she has had an abnormal breathing pattern during sleep.  She awakens herself fairly frequently with snoring and also had noisy breathing.  She has frequent awakenings at night, and is not rested in the mornings upon arising, but is unsure if this is related to reflux or her chronic pain from rheumatoid arthritis.  She does have sleep pressure during the late mornings, and takes a nap daily after lunch for 1-2 hours.  She does not have issues with sleepiness in the evening until she gets closer to bedtime.  She denies any sleepiness with driving.  The patient states that her weight is neutral her for the last 2 years, and her Epworth sleepiness score today is abnormal at 12.   Sleep Questionnaire What time do you typically go to bed?( Between what hours) 10-11pm 10-11pm at 1041 on 02/03/13 by Maisie Fus, CMA How long does it take you to fall asleep? 5-10 mins 5-10 mins at 1041 on 02/03/13 by Maisie Fus, CMA How many times during the night do you wake up? 5 5 at 1041 on 02/03/13 by Maisie Fus, CMA What time do you get out of bed to start your day? 0630 0630 at 1041 on 02/03/13 by Maisie Fus, CMA Do you drive or operate heavy machinery in your occupation? No No at 1041 on 02/03/13 by Maisie Fus, CMA How much has your weight changed (up or down) over the past two years? (In pounds) 5 lb (2.268 kg)5 lb (2.268 kg) 2-5lb increase at 1041 on 02/03/13 by Maisie Fus, CMA Have you ever had a sleep study before? No No at 1041 on 02/03/13 by Maisie Fus, CMA Do you currently use CPAP? No No at 1041 on 02/03/13 by Maisie Fus, CMA Do you wear oxygen at any time? No No at 1041 on 02/03/13 by  Maisie Fus, CMA   Review of Systems  Constitutional: Negative for fever and unexpected weight change.  HENT: Positive for trouble swallowing. Negative for ear pain, nosebleeds, congestion, sore throat, rhinorrhea, sneezing, dental problem, postnasal drip and sinus pressure.   Eyes: Negative for redness and itching.  Respiratory: Positive for cough and choking ( gag easily). Negative for chest tightness, shortness of breath and wheezing.   Cardiovascular: Negative for palpitations and leg swelling.  Gastrointestinal: Negative for nausea and vomiting.       Acid heartburn  Genitourinary: Negative for dysuria.  Musculoskeletal: Positive for joint swelling and arthralgias.  Skin: Negative for rash.  Neurological: Negative for headaches.  Hematological: Does not bruise/bleed easily.  Psychiatric/Behavioral: Negative for dysphoric mood. The patient is not nervous/anxious.        Objective:   Physical Exam Constitutional:  Well developed, no acute distress  HENT:  Nares patent without discharge  Oropharynx without exudate, palate and uvula are elongated  Eyes:  Left pupil greater than right, eomi, no scleral icterus  Neck:  No JVD, no TMG  Cardiovascular:  Normal rate, regular rhythm, no rubs or gallops.  No murmurs        Intact distal pulses  Pulmonary :  Normal breath sounds, no stridor or respiratory distress  No rales, rhonchi, or wheezing  Abdominal:  Soft, nondistended, bowel sounds present.  No tenderness noted.   Musculoskeletal:  No lower extremity edema noted.  Lymph Nodes:  No cervical lymphadenopathy noted  Skin:  No cyanosis noted  Neurologic:  Alert, appropriate, moves all 4 extremities without obvious deficit.         Assessment & Plan:

## 2013-02-03 NOTE — Assessment & Plan Note (Signed)
The patient has a history of snoring, as well as intermittent episodes of arousal with noisy breathing.  She also has some degree of daytime sleepiness.  Even her body habitus is not classic for sleep disordered breathing, her history suggests this may be an issue.  On the other hand, her sleep disruption and daytime sleepiness may simply be from her chronic pain syndrome and possibly nocturnal reflux.  At this point, we need to put the issue to rest, and will schedule for home sleep testing.  The patient is agreeable to this approach.

## 2013-02-16 DIAGNOSIS — R0989 Other specified symptoms and signs involving the circulatory and respiratory systems: Secondary | ICD-10-CM

## 2013-02-16 DIAGNOSIS — R0609 Other forms of dyspnea: Secondary | ICD-10-CM

## 2013-03-02 ENCOUNTER — Telehealth: Payer: Self-pay | Admitting: Pulmonary Disease

## 2013-03-02 NOTE — Telephone Encounter (Signed)
[id number]. Dorothy Landgrebe, CMA   

## 2013-03-03 NOTE — Telephone Encounter (Signed)
Called, spoke with pt -  Pt had a home sleep study on 9/16.  She brought the machine back on 9/17.  Pt is requesting results.  Dr. Shelle Iron, pls advise.  Thank you.  * Pt requesting we call her back on cell #.  She is currently out of town.

## 2013-03-04 ENCOUNTER — Ambulatory Visit: Payer: Medicare Other | Admitting: Internal Medicine

## 2013-03-04 ENCOUNTER — Encounter: Payer: Self-pay | Admitting: Internal Medicine

## 2013-03-04 DIAGNOSIS — G4733 Obstructive sleep apnea (adult) (pediatric): Secondary | ICD-10-CM

## 2013-03-04 NOTE — Telephone Encounter (Signed)
Pt scheduled for Oct 20 at 915 Patient is having family difficulties right now with an elderly member, states she is going to be out of town most of the week next week and is not sure she would be able to make it back for an appt and would hate to miss/forget if she were to schedule.  Please advise if Oct 20 is okay or if it is important for patient to f/u ASAP. Patient states that if so, she will try her hardest to make appt next week if scheduled.

## 2013-03-04 NOTE — Telephone Encounter (Signed)
Please advise Felicia [name] thanks 

## 2013-03-04 NOTE — Telephone Encounter (Signed)
That apptm is fine

## 2013-03-04 NOTE — Telephone Encounter (Signed)
ATC cell # --NA  ATC home # busy

## 2013-03-04 NOTE — Telephone Encounter (Signed)
Felicia [name], this pt needs ov to review her sleep study.  Needs by next week if possible.

## 2013-03-05 NOTE — Telephone Encounter (Signed)
Pt is aware. Nothing further needed 

## 2013-03-22 ENCOUNTER — Ambulatory Visit (INDEPENDENT_AMBULATORY_CARE_PROVIDER_SITE_OTHER): Payer: Medicare Other | Admitting: Pulmonary Disease

## 2013-03-22 ENCOUNTER — Encounter: Payer: Self-pay | Admitting: Pulmonary Disease

## 2013-03-22 VITALS — BP 138/70 | HR 58 | Temp 97.4°F | Ht 66.0 in | Wt 142.8 lb

## 2013-03-22 DIAGNOSIS — G4733 Obstructive sleep apnea (adult) (pediatric): Secondary | ICD-10-CM | POA: Insufficient documentation

## 2013-03-22 NOTE — Progress Notes (Signed)
  Subjective:    Patient ID: Felicia [name], female    DOB: [birth year], 72 y.o.   MRN: [id number]  HPI The patient comes in today for followup after her recent sleep study.  She was found to have mild OSA, with an AHI of 13 events per hour.  I have reviewed the study with her in detail, and answered all of her questions.   Review of Systems  Constitutional: Negative for fever and unexpected weight change.  HENT: Positive for congestion, postnasal drip and rhinorrhea. Negative for dental problem, ear pain, nosebleeds, sinus pressure, sneezing, sore throat and trouble swallowing.   Eyes: Negative for redness and itching.  Respiratory: Negative for cough, chest tightness, shortness of breath and wheezing.   Cardiovascular: Negative for palpitations and leg swelling.  Gastrointestinal: Negative for nausea and vomiting.  Genitourinary: Negative for dysuria.  Musculoskeletal: Negative for joint swelling.  Skin: Negative for rash.  Neurological: Negative for headaches.  Hematological: Does not bruise/bleed easily.  Psychiatric/Behavioral: Positive for sleep disturbance ( frequent awakenings). Negative for dysphoric mood. The patient is not nervous/anxious.        Objective:   Physical Exam Wd female in nad Nose without purulence or d/c noted. Neck without LN or TMG LE without edema or cyanosis Alert, does not appear to be sleepy, moves all 4.        Assessment & Plan:

## 2013-03-22 NOTE — Patient Instructions (Signed)
Do some research regarding cpap (continuous positive airway pressure) versus dental appliance for your mild sleep apnea.  Please let me know your decision.

## 2013-03-22 NOTE — Assessment & Plan Note (Signed)
The patient has mild obstructive sleep apnea by her recent study, and I have reviewed the various treatment options.  I told her this does not represent an increased cardiovascular risk for her, and the decision to treat this aggressively should be based upon the impact to her quality of life.  She does want to treat this aggressively to see if her sleep daytime alertness improve.  I have outlined a dental appliance, as well as a CPAP trial.  I have asked her to do some research, and to get back to me with her decision.

## 2013-03-25 ENCOUNTER — Encounter: Payer: Self-pay | Admitting: Family Medicine

## 2013-03-25 ENCOUNTER — Ambulatory Visit (INDEPENDENT_AMBULATORY_CARE_PROVIDER_SITE_OTHER): Payer: Medicare Other | Admitting: Family Medicine

## 2013-03-25 VITALS — BP 112/72 | HR 66 | Temp 97.9°F | Wt 140.6 lb

## 2013-03-25 DIAGNOSIS — J019 Acute sinusitis, unspecified: Secondary | ICD-10-CM

## 2013-03-25 DIAGNOSIS — R05 Cough: Secondary | ICD-10-CM

## 2013-03-25 DIAGNOSIS — R059 Cough, unspecified: Secondary | ICD-10-CM

## 2013-03-25 MED ORDER — GUAIFENESIN-CODEINE 100-10 MG/5ML PO SYRP: ORAL_SOLUTION | ORAL | Status: DC

## 2013-03-25 MED ORDER — CEFUROXIME AXETIL 500 MG PO TABS: 500.0000 mg | ORAL_TABLET | Freq: Two times a day (BID) | ORAL | Status: AC

## 2013-03-25 NOTE — Progress Notes (Signed)
  Subjective:     Felicia [name] is a 72 y.o. female who presents for evaluation of sinus pain. Symptoms include: congestion, cough, facial pain, headaches, nasal congestion, purulent rhinorrhea and sinus pressure. Onset of symptoms was 4 days ago. Symptoms have been gradually worsening since that time. Past history is significant for no history of pneumonia or bronchitis. Patient is a non-smoker.  The following portions of the patient's history were reviewed and updated as appropriate: allergies, current medications, past family history, past medical history, past social history, past surgical history and problem list.  Review of Systems Pertinent items are noted in HPI.   Objective:    BP 112/72  Pulse 66  Temp(Src) 97.9 F (36.6 C) (Oral)  Wt 140 lb 9.6 oz (63.776 kg)  BMI 22.7 kg/m2  SpO2 97% General appearance: alert, cooperative, appears stated age and no distress Ears: normal TM's and external ear canals both ears Nose: green discharge, moderate congestion, turbinates red, swollen, sinus tenderness bilateral Throat: lips, mucosa, and tongue normal; teeth and gums normal Neck: mild anterior cervical adenopathy, supple, symmetrical, trachea midline and thyroid not enlarged, symmetric, no tenderness/mass/nodules Lungs: clear to auscultation bilaterally Heart: S1, S2 normal    Assessment:    Acute bacterial sinusitis.    Plan:    Nasal steroids per medication orders. Antihistamines per medication orders. Ceftin per medication orders.  qnasl sample

## 2013-03-25 NOTE — Patient Instructions (Signed)

## 2013-04-08 ENCOUNTER — Telehealth: Payer: Self-pay | Admitting: Family Medicine

## 2013-04-08 MED ORDER — AMOXICILLIN-POT CLAVULANATE 875-125 MG PO TABS: 1.0000 | ORAL_TABLET | Freq: Two times a day (BID) | ORAL | Status: DC

## 2013-04-08 NOTE — Telephone Encounter (Signed)
Referral made to Dr. Crecencio Mc who is excellent.  Please  schedule fasting Labs after 10 weeks of statin : CK,Lipids, hepatic panel. Orders entered for 06/23/13

## 2013-04-08 NOTE — Addendum Note (Signed)
Addended by: Arnette Norris on: 04/08/2013 03:46 PM   Modules accepted: Orders

## 2013-04-08 NOTE — Telephone Encounter (Signed)
Please advise if refill appropriate.      KP 

## 2013-04-08 NOTE — Telephone Encounter (Signed)
Patient states she was given antibiotic for sinus infection on 03/25/13. She has rheumatoid arthritis and did her orencia and thinks this made the antibiotic less effective. She is requesting another dose of antibiotics because she is not completely better. Patient also states her BP is elevated. Pt uses [facility] on [address].

## 2013-04-08 NOTE — Telephone Encounter (Signed)
Please advise 

## 2013-04-08 NOTE — Telephone Encounter (Signed)
Felicia [name] , do you feel comfortable refilling antibiotic. She 'll need to follow up the BP issues with OV

## 2013-04-08 NOTE — Telephone Encounter (Signed)
augmentin 875 mg bid for 10 days  Ov if no better after that

## 2013-04-08 NOTE — Telephone Encounter (Signed)
Spoke with patient and she has been instructed per Dr.Lowne to discontinue the Methotrexate and Orencia while on her Abx and she agreed to do so, she will monitor her BP and if not better she will follow up with [facility].      KP

## 2013-04-20 ENCOUNTER — Telehealth: Payer: Self-pay | Admitting: Pulmonary Disease

## 2013-04-20 ENCOUNTER — Telehealth: Payer: Self-pay | Admitting: Internal Medicine

## 2013-04-20 NOTE — Telephone Encounter (Signed)
Spoke with pt-- At last OV pt recommended to research CPAP. Would like to start CPAP-- needs DME as well. Pt aware to contact our office if have not heard anything by Friday from our office or DME. Pt is very eager to begin.   Please advise on order Dr Shelle Iron. Thanks.

## 2013-04-20 NOTE — Telephone Encounter (Signed)
Patient is calling in regards to her BP. States that it has recently been high and she would like to know if Dr. Alwyn Ren would advise her blood pressure medication being changed.

## 2013-04-21 ENCOUNTER — Other Ambulatory Visit: Payer: Self-pay | Admitting: Pulmonary Disease

## 2013-04-21 DIAGNOSIS — G4733 Obstructive sleep apnea (adult) (pediatric): Secondary | ICD-10-CM

## 2013-04-21 NOTE — Telephone Encounter (Signed)
Minimal Blood Pressure Goal= AVERAGE < 140/90;  Ideal is an AVERAGE < 135/85. This AVERAGE should be calculated from @ least 5-7 BP readings taken @ different times of day on different days of week. You should not respond to isolated  HIGH OR LOW BP readings  , but rather to the AVERAGE for that week .Please bring your  blood pressure cuff to office visits to verify that it is reliable.It  can also be checked against the blood pressure device at the pharmacy. Finger or wrist cuffs are not dependable; an arm cuff is.

## 2013-04-21 NOTE — Telephone Encounter (Signed)
Patient called today and stated that her blood pressure readings very from very high to normal. She would like to know if an OV is needed or if her medications needs to be adjusted. Please advise.

## 2013-04-21 NOTE — Telephone Encounter (Signed)
Order has been sent to [facility] for cpap

## 2013-04-22 ENCOUNTER — Telehealth: Payer: Self-pay | Admitting: *Deleted

## 2013-04-22 ENCOUNTER — Ambulatory Visit (INDEPENDENT_AMBULATORY_CARE_PROVIDER_SITE_OTHER): Payer: Medicare Other | Admitting: Internal Medicine

## 2013-04-22 ENCOUNTER — Encounter: Payer: Self-pay | Admitting: Internal Medicine

## 2013-04-22 VITALS — BP 176/82 | HR 70 | Temp 97.7°F | Resp 13 | Ht 66.0 in | Wt 141.8 lb

## 2013-04-22 DIAGNOSIS — I10 Essential (primary) hypertension: Secondary | ICD-10-CM

## 2013-04-22 MED ORDER — LOSARTAN POTASSIUM 100 MG PO TABS: ORAL_TABLET | ORAL | Status: DC

## 2013-04-22 NOTE — Patient Instructions (Signed)
Your next office appointment will be determined based upon response to Losartan. Followup as needed for your this acute issue. Please report any significant change in your symptoms.Minimal Blood Pressure Goal= AVERAGE < 140/90;  Ideal is an AVERAGE < 135/85. This AVERAGE should be calculated from @ least 5-7 BP readings taken @ different times of day on different days of week. You should not respond to isolated BP readings , but rather the AVERAGE for that week .Please bring your  blood pressure cuff to office visits to verify that it is reliable.It  can also be checked against the blood pressure device at the pharmacy.

## 2013-04-22 NOTE — Telephone Encounter (Signed)
Patient returned my call with B/P readings this week. Appt scheduled for this afternoon with Dr. Alwyn Ren.  178/97 154/83 170/87 167/85 149/47 168/87 161/94  Average is 163/82

## 2013-04-22 NOTE — Telephone Encounter (Signed)
Called pt to make aware that she should average her B/P readings and if the average is greater than 135/85 then should call us to make an appt to be seen. Patient aslsos advised that message has been sent via MyChart.

## 2013-04-22 NOTE — Progress Notes (Signed)
Pre visit review using our clinic review tool, if applicable. No additional management support is needed unless otherwise documented below in the visit note. 

## 2013-04-22 NOTE — Progress Notes (Signed)
  Subjective:    Patient ID: Felicia [name], female    DOB: 1940-06-05, 72 y.o.   MRN: [id number]  HPI Blood pressure range 135/47-180/102 . Her blood pressure became more labile and elevated in 2013 when she had come off her rheumatoid arthritis medications during the perioperative eriods  at [facility] with anti hypertemsive medication. No lightheadedness or other adverse medication effect described.   Recent labs from her rheumatologist were reviewed. Her creatinine was 0.75.        Review of Systems Significant  epistaxis, chest pain, palpitations, exertional dyspnea, claudication, paroxysmal nocturnal dyspnea, or edema absent. Intermittent low grade headaches. Mild sleep apnea diagnosed; equipment pending.     Objective:   Physical Exam Appears healthy and well-nourished & in no acute distress  No carotid bruits are present.No neck pain distention present at 10 - 15 degrees. Thyroid normal to palpation  Heart rhythm and rate are normal with Grade 1 systolic murmur.  Chest is clear with no increased work of breathing  There is no evidence of aortic aneurysm or renal artery bruits  Abdomen soft with no organomegaly or masses. No HJR  No clubbing, cyanosis or edema present.  Pedal pulses are intact   No ischemic skin changes are present . Nails healthy    Alert and oriented. Strength, tone, DTRs reflexes normal          Assessment & Plan:  See Current Assessment & Plan in Problem List under specific Diagnosis

## 2013-04-22 NOTE — Assessment & Plan Note (Signed)
Add Losartan 100 mg  1/2 qd with itration

## 2013-04-22 NOTE — Telephone Encounter (Signed)
Pt is aware that we have placed the order for CPAP. Nothing further was needed at this.

## 2013-04-23 NOTE — Telephone Encounter (Signed)
Pt was seen for OV w/ Dr. Alwyn Ren on 04-22-13 regarding her BP.//AB/CMA

## 2013-05-24 ENCOUNTER — Telehealth: Payer: Self-pay | Admitting: *Deleted

## 2013-05-24 NOTE — Telephone Encounter (Signed)
Spoke with pt who advised B/P averages were still elevated, appt made on 05/26/13 at 9am with Dr. Alwyn Ren.

## 2013-05-26 ENCOUNTER — Ambulatory Visit (INDEPENDENT_AMBULATORY_CARE_PROVIDER_SITE_OTHER): Payer: Medicare Other | Admitting: Internal Medicine

## 2013-05-26 ENCOUNTER — Encounter: Payer: Self-pay | Admitting: Internal Medicine

## 2013-05-26 VITALS — BP 125/71 | HR 64 | Temp 98.0°F | Ht 64.75 in | Wt 142.2 lb

## 2013-05-26 DIAGNOSIS — I10 Essential (primary) hypertension: Secondary | ICD-10-CM

## 2013-05-26 DIAGNOSIS — I951 Orthostatic hypotension: Secondary | ICD-10-CM | POA: Insufficient documentation

## 2013-05-26 MED ORDER — LOSARTAN POTASSIUM 100 MG PO TABS: ORAL_TABLET | ORAL | Status: DC

## 2013-05-26 NOTE — Progress Notes (Signed)
Pre visit review using our clinic review tool, if applicable. No additional management support is needed unless otherwise documented below in the visit note. 

## 2013-05-26 NOTE — Patient Instructions (Signed)
Minimal Blood Pressure Goal= AVERAGE < 140/90;  Ideal is an AVERAGE < 135/85. This AVERAGE should be calculated from @ least 5-7 BP readings taken @ different times of day on different days of week. You should not respond to isolated BP readings , but rather the AVERAGE for that week .Please take your  blood pressure cuff to office visits to verify that it is reliable.It  can also be checked against the blood pressure device at the pharmacy. Perform isometric exercise of calves  ( while seated go up on toes to count of 5 & then onto heels for 5 count). Repeat  4- 5 times prior to standing if you've been seated or supine for any significant period of time as BP drops with such positions.

## 2013-05-26 NOTE — Progress Notes (Signed)
   Subjective:    Patient ID: Felicia [name], female    DOB: 1940-08-12, 72 y.o.   MRN: [id number]  HPI Blood pressure range of 5 day averages: 138/71.2-142/73.2. Compliant with anti hypertemsive medication. Occasional postural lightheadedness mainly in am.        Review of Systems  Significant headaches, epistaxis, exertional chest pain, palpitations, exertional dyspnea, claudication, paroxysmal nocturnal dyspnea, or edema absent. Adjusting to CPAP.      Objective:   Physical Exam  Appears healthy and well-nourished & in no acute distress.Appears younger than stated age  No carotid bruits are present.No neck pain distention present at 10 - 15 degrees.  Heart rhythm and rate are normal with no significant murmurs or gallops. S4  Chest is clear with no increased work of breathing  There is no evidence of aortic aneurysm or renal artery bruits  Abdomen soft with no organomegaly or masses. No HJR  No clubbing, cyanosis or edema present.  Pedal pulses are intact   No ischemic skin changes are present .   Alert and oriented. Strength, tone normal          Assessment & Plan:  See Current Assessment & Plan in Problem List under specific Diagnosis

## 2013-05-26 NOTE — Assessment & Plan Note (Signed)
Goals discussed; no change

## 2013-05-26 NOTE — Assessment & Plan Note (Signed)
Perform isometric exercise of calves  ( while seated go up on toes to count of 5 & then onto heels for 5 count). Repeat  4- 5 times prior to standing if you've been seated or supine for any significant period of time as BP drops with such positions.  

## 2013-07-05 ENCOUNTER — Ambulatory Visit: Payer: Medicare Other | Admitting: Podiatry

## 2013-07-05 ENCOUNTER — Ambulatory Visit (INDEPENDENT_AMBULATORY_CARE_PROVIDER_SITE_OTHER): Payer: Medicare Other | Admitting: Podiatry

## 2013-07-05 ENCOUNTER — Encounter: Payer: Self-pay | Admitting: Podiatry

## 2013-07-05 VITALS — BP 143/82 | HR 71 | Resp 18

## 2013-07-05 DIAGNOSIS — Q828 Other specified congenital malformations of skin: Secondary | ICD-10-CM

## 2013-07-05 NOTE — Progress Notes (Signed)
° °  Subjective:    Patient ID: Felicia [name], female    DOB: 07-11-1940, 73 y.o.   MRN: [id number]  HPI I have a callus on the ball of my right foot. This patient was last seen for similar service by DR. Regal on 11/09/2012    Review of Systems     Objective:   Physical Exam  Nucleated keratoses plantar right lesser MPJ overlying bony prominence.      Assessment & Plan:   Assessment: Porokeratoses plantar right  Plan: The keratoses debrided and packed with salinocaine. Reappoint at patient's request

## 2013-08-04 ENCOUNTER — Other Ambulatory Visit: Payer: Self-pay | Admitting: Internal Medicine

## 2013-08-09 ENCOUNTER — Encounter: Payer: Self-pay | Admitting: Internal Medicine

## 2013-09-03 ENCOUNTER — Other Ambulatory Visit (HOSPITAL_COMMUNITY): Payer: Self-pay | Admitting: Internal Medicine

## 2013-09-03 DIAGNOSIS — R011 Cardiac murmur, unspecified: Secondary | ICD-10-CM

## 2013-09-13 ENCOUNTER — Ambulatory Visit (HOSPITAL_COMMUNITY)
Admission: RE | Admit: 2013-09-13 | Discharge: 2013-09-13 | Disposition: A | Discharge status: Home / Self Care | Payer: Medicare Other | Source: Ambulatory Visit | Attending: Cardiology | Admitting: Cardiology

## 2013-09-13 DIAGNOSIS — I369 Nonrheumatic tricuspid valve disorder, unspecified: Secondary | ICD-10-CM

## 2013-09-13 DIAGNOSIS — R011 Cardiac murmur, unspecified: Secondary | ICD-10-CM

## 2013-09-13 NOTE — Progress Notes (Signed)
2D Echo Performed 09/13/2013    Marygrace Drought, RCS

## 2013-10-29 ENCOUNTER — Telehealth: Payer: Self-pay | Admitting: Pulmonary Disease

## 2013-10-29 NOTE — Telephone Encounter (Signed)
Spoke with patient-she was wanting to know when she needed to follow up for her sleep with [facility]. She was seen last 03-2013 and then decided to set up CPAP-no appt was scheduled then. I set patient up an appt on Tuesday 11-23-13 at 10:30am to see [facility] and discuss her CPAP usage and needs. Pt will go by [employer] about 1 week prior to appt with [facility] to have them download her chip and send to [facility] to review. Nothing more needed at this time.

## 2013-11-23 ENCOUNTER — Ambulatory Visit (INDEPENDENT_AMBULATORY_CARE_PROVIDER_SITE_OTHER): Payer: Medicare Other | Admitting: Pulmonary Disease

## 2013-11-23 ENCOUNTER — Encounter: Payer: Self-pay | Admitting: Pulmonary Disease

## 2013-11-23 VITALS — BP 102/62 | HR 52 | Temp 97.7°F | Ht 66.0 in | Wt 144.4 lb

## 2013-11-23 DIAGNOSIS — G4733 Obstructive sleep apnea (adult) (pediatric): Secondary | ICD-10-CM

## 2013-11-23 NOTE — Patient Instructions (Signed)
Continue with cpap, and will have your homecare company work with you on mask fit. followup with me in one year if doing well.

## 2013-11-23 NOTE — Progress Notes (Signed)
   Subjective:    Patient ID: Felicia [name], female    DOB: 04/18/1941, 73 y.o.   MRN: [id number]  HPI The patient comes in today for followup of her obstructive sleep apnea. She is wearing CPAP compliantly, and is having no issues with her pressure. She is having ongoing issues with her mask fit, and is having difficulties working with her home care company. Her download today shows excellent control of her sleep apnea, but does verify mask leaks. The patient feels that she is sleeping much better with the device, with improved daytime alertness.   Review of Systems  Constitutional: Negative for fever and unexpected weight change.  HENT: Negative for congestion, dental problem, ear pain, nosebleeds, postnasal drip, rhinorrhea, sinus pressure, sneezing, sore throat and trouble swallowing.   Eyes: Negative for redness and itching.  Respiratory: Negative for cough, chest tightness, shortness of breath and wheezing.   Cardiovascular: Negative for palpitations and leg swelling.  Gastrointestinal: Negative for nausea and vomiting.  Genitourinary: Negative for dysuria.  Musculoskeletal: Negative for joint swelling.  Skin: Negative for rash.  Neurological: Negative for headaches.  Hematological: Does not bruise/bleed easily.  Psychiatric/Behavioral: Negative for dysphoric mood. The patient is not nervous/anxious.        Objective:   Physical Exam Well-developed female in no acute distress Nose without purulence or discharge noted Neck without lymphadenopathy or thyromegaly  No skin breakdown or pressure necrosis from the CPAP mask Lower extremities without edema, no cyanosis Alert and oriented, moves all 4 extremities.       Assessment & Plan:

## 2013-11-23 NOTE — Assessment & Plan Note (Signed)
The patient is doing very well with CPAP from a sleep and daytime alertness standpoint, but is having ongoing mask leaks. Her download verifies this. I will get her back to her home care company, and to work on a better fitting mask. If she continues to have issues, I would recommend that she go to the sleep Center for a formal mask fitting. If the patient is doing well, will see her back in one year.

## 2013-12-13 ENCOUNTER — Telehealth: Payer: Self-pay | Admitting: Pulmonary Disease

## 2013-12-13 NOTE — Telephone Encounter (Signed)
lmtcb x1 

## 2013-12-14 NOTE — Telephone Encounter (Signed)
Spoke with [facility] Service approved since CPAP compliance greater than 90% and limited AHI  After this 7 month period is up, the patient will then own their device.  Nothing further needed.

## 2013-12-17 ENCOUNTER — Other Ambulatory Visit: Payer: Self-pay | Admitting: Dermatology

## 2013-12-21 ENCOUNTER — Other Ambulatory Visit: Payer: Self-pay | Admitting: Gastroenterology

## 2013-12-27 ENCOUNTER — Encounter: Payer: Self-pay | Admitting: Internal Medicine

## 2013-12-27 ENCOUNTER — Ambulatory Visit (INDEPENDENT_AMBULATORY_CARE_PROVIDER_SITE_OTHER): Payer: Medicare Other | Admitting: Internal Medicine

## 2013-12-27 VITALS — BP 124/68 | HR 64 | Ht 64.5 in | Wt 142.0 lb

## 2013-12-27 DIAGNOSIS — K219 Gastro-esophageal reflux disease without esophagitis: Secondary | ICD-10-CM

## 2013-12-27 MED ORDER — ESOMEPRAZOLE MAGNESIUM 40 MG PO CPDR: 40.0000 mg | DELAYED_RELEASE_CAPSULE | Freq: Two times a day (BID) | ORAL | Status: DC

## 2013-12-27 NOTE — Patient Instructions (Signed)
We have sent the following medications to your pharmacy for you to pick up at your convenience: Nexium  Today you have been provided FODMAP to read and follow.  Try Hardin Negus pro-biotic over the counter to put the good bacteria back into your colon.   I appreciate the opportunity to care for you.

## 2013-12-27 NOTE — Progress Notes (Signed)
[facility]  Felicia [name]    [id number]    05-02-1941    Assessment and Plan/Recommendations: GERD- Renew nexium prescription, BID.  1-2 year follow up prn. Colon cancer  Screening - Last 2007, Normal.  Will need f/u in 2017   HPI: Felicia [name] is a 73 y.o. female with history of GERD and RA who presents to day for her yearly check up and refill of medications.  Her symptoms were managed well on Nexium, and since running out yesterday has had some hoarseness and fullness in her throat.  Pt also has a new diagnosis of Sleep Apnea but is managed well and has relief from her CPAP machine.  She takes metamucil and moves her bowels regularly.  She does complain of some bloating vs central obesity that she cannot lose through exercise.  She denies N/V, Diarrhea, Constipation, melana, hematochezia, weight loss, and abdominal pain.  She does have some fatigue and occasional joint pain and swelling 2/2 her RA.    Outpatient Encounter Prescriptions as of 12/27/2013  Medication Sig  . AMBULATORY NON FORMULARY MEDICATION CPAP @@ night  . aspirin 81 MG tablet Take 81 mg by mouth daily.   . Calcium Carbonate-Vitamin D (CALTRATE 600+D) 600-400 MG-UNIT per tablet Take 1 tablet by mouth daily.  . Carboxymethylcellulose Sodium (REFRESH OP) Apply to eye as needed.   . Cholecalciferol (VITAMIN D3) 1000 UNITS CAPS Take by mouth daily.  Marland Kitchen estrogens, conjugated, (PREMARIN) 0.625 MG tablet Take 0.625 mg by mouth every evening.   . folic acid (FOLVITE) 1 MG tablet Take 2 mg by mouth daily.  . Iron TABS Take 1 tablet by mouth daily.  Astrid Drafts Omega-3 300 MG CAPS Take 1 capsule by mouth daily.  Marland Kitchen latanoprost (XALATAN) 0.005 % ophthalmic solution Place 1 drop into both eyes at bedtime.   Marland Kitchen losartan (COZAAR) 100 MG tablet 1 qd  . METHOTREXATE SODIUM IJ Inject 25 mg as directed once a week. Sunday  . metoprolol tartrate (LOPRESSOR) 25 MG tablet take 1 tablet by mouth twice a day  . metroNIDAZOLE  (METROGEL) 1 % gel Apply 1 application topically every evening.   . Misc Natural Products (TART CHERRY ADVANCED PO) Take by mouth. Take 2 tbsp by mouth in liquid once daily  . Multiple Vitamins-Minerals (CENTRUM SILVER PO) Take by mouth.  Marland Kitchen NEXIUM 40 MG capsule take 1 capsule by mouth twice a day  . ORENCIA 125 MG/ML SOSY Inject 125 mg into the skin once a week.  . Probiotic Product (ALIGN PO) Take 1 tablet by mouth every evening.  . progesterone (PROMETRIUM) 100 MG capsule Take 100 mg by mouth every evening.   . Psyllium (METAMUCIL PO) Take by mouth.  . timolol (BETIMOL) 0.5 % ophthalmic solution Place 1 drop into the right eye daily.    . timolol (TIMOPTIC) 0.5 % ophthalmic solution   . traMADol (ULTRAM) 50 MG tablet Take 50 mg by mouth at bedtime as needed.   . TURMERIC PO Take 1,050 mg by mouth daily.    Allergies as of 12/27/2013 - Review Complete 12/27/2013  Allergen Reaction Noted  . Sulfonamide derivatives    . Zostavax [zoster vaccine live]  07/27/2012  . Norvasc [amlodipine besylate]  11/07/2011    Past Medical History  Diagnosis Date  . GERD (gastroesophageal reflux disease)   . Rheumatoid arthritis(714.0)   . Glaucoma   . Hypertension   . Complication of anesthesia     small airway per patient  .  Hiatal hernia   . Adenomatous colon polyp 2004  . Sleep apnea     on CPAP    Past Surgical History  Procedure Laterality Date  . Cataract extraction, bilateral    . Trabeculectomy      X 2 OD; X 1 OS  . Upper gastrointestinal endoscopy      hiatal hernia  . Colonoscopy w/ polypectomy      X1; negative subsequently  . Dilation and curettage of uterus    . Hysteroscopy w/d&c  06/24/2012    Procedure: DILATATION AND CURETTAGE /HYSTEROSCOPY;  Surgeon: [name], MD;  Location: [facility];  Service: Gynecology;  Laterality: N/A;  . Shoulder surgery Left     due to RA  . Eye surgery      due to RA    Family History  Problem Relation Age of Onset  . Dementia  Mother   . Lung disease Mother     bronchiectasis  . Heart disease Mother     Aortic Stenosis  . Osteoporosis Mother   . Heart attack Father 84    S/P CBAG  . Stroke Maternal Grandmother 83  . Diabetes Neg Hx   . Cancer Neg Hx     History   Social History  . Marital Status: Single    Spouse Name: N/A    Number of Children: N/A  . Years of Education: N/A   Occupational History  . retired    Social History Main Topics  . Smoking status: Former Smoker -- 0.50 packs/day for 5 years    Types: Cigarettes    Quit date: 06/04/1971  . Smokeless tobacco: Never Used     Comment: smoked about 3-5 years , up to 1/2 ppd  . Alcohol Use: No  . Drug Use: No  . Sexual Activity: Yes    Birth Control/ Protection: Post-menopausal   Other Topics Concern  . Not on file   Social History Narrative  . No narrative on file      Review of systems: Positive for: hoarseness, fatigue All other ROS negative or as per HPI  Physical Exam: BP 124/68  Pulse 64  Ht 5' 4.5" (1.638 m)  Wt 142 lb (64.411 kg)  BMI 24.01 kg/m2 Constitutional: Very pleasant WDWN female in NAD Eyes: anicteric Lungs: clear to auscultation bilaterally, unlabored and even Cardiovascular: S1S2 with regular rate and rhythm, no rubs murmurs or gallops noted Abdomen: soft, nontender, nondistended, no masses or organomegaly, normal bowel sounds. No hernias Extremities: no lower extremity edema. 2+ radial and DP pulses Skin: no rash Neuro: alert and oriented x 3 Psych: normal mood and affect  Mercer Pod PA Student 12/27/2013 11:22 AM   I have personally seen the patient, reviewed and repeated key elements of the history and physical and participated in formation of the assessment and plan the student has documented. Gatha Mayer, MD, [facility]

## 2014-02-01 ENCOUNTER — Ambulatory Visit (INDEPENDENT_AMBULATORY_CARE_PROVIDER_SITE_OTHER): Payer: Medicare Other | Admitting: Pulmonary Disease

## 2014-02-01 ENCOUNTER — Encounter: Payer: Self-pay | Admitting: Pulmonary Disease

## 2014-02-01 VITALS — BP 122/78 | HR 61 | Temp 98.4°F | Ht 66.0 in | Wt 144.4 lb

## 2014-02-01 DIAGNOSIS — G4733 Obstructive sleep apnea (adult) (pediatric): Secondary | ICD-10-CM

## 2014-02-01 NOTE — Assessment & Plan Note (Addendum)
The patient is having frequent awakenings at night that may or may not be do to her CPAP mask. She has a chronic pain syndrome from her rheumatoid arthritis, and it could be the etiology of her awakenings.  She also has continued to leak with her current mask, and I think it is worthwhile to refer her to the sleep Center for a mask fitting session. I've also asked her to think about her sleeping environment, and if there is any issue there as well.

## 2014-02-01 NOTE — Progress Notes (Signed)
   Subjective:    Patient ID: Felicia [name], female    DOB: Sep 22, 1940, 73 y.o.   MRN: [id number]  HPI The patient comes in today for followup of her obstructive sleep apnea. She has finally found a mask that fits her well, but she is awakening during the night frequently. She feels that she is not getting air or an adequate air through her nasal pillows, but also admits that she has a chronic pain syndrome from her rheumatoid arthritis that also bothers her at night.  I have reviewed her download for the last 30 days which shows excellent compliance, good control of her sleep apnea, but still having significant mask leak.   Review of Systems  Constitutional: Positive for fatigue. Negative for fever and unexpected weight change.  HENT: Negative for congestion, dental problem, ear pain, nosebleeds, postnasal drip, rhinorrhea, sinus pressure, sneezing, sore throat and trouble swallowing.   Eyes: Negative for redness and itching.  Respiratory: Negative for cough, chest tightness, shortness of breath and wheezing.   Cardiovascular: Negative for palpitations and leg swelling.  Gastrointestinal: Negative for nausea and vomiting.  Genitourinary: Negative for dysuria.  Musculoskeletal: Negative for joint swelling.  Skin: Negative for rash.  Neurological: Negative for headaches.  Hematological: Does not bruise/bleed easily.  Psychiatric/Behavioral: Negative for dysphoric mood. The patient is not nervous/anxious.        Objective:   Physical Exam Thin female in no acute distress Nose without purulence or discharge noted Neck without lymphadenopathy or thyromegaly No skin breakdown or pressure necrosis from the CPAP mask Lower extremities without edema, no cyanosis Alert and oriented, moves all 4 extremities.       Assessment & Plan:

## 2014-02-01 NOTE — Patient Instructions (Signed)
Will refer you to the sleep center during the day for a mask fitting.  Please take your current mask with you. If we are able to solve the mask leak issue, and you are still having frequent awakenings, think about the other sources that we discussed (environment, chronic pain, anxiety/depression) If doing well, will see you back in one year

## 2014-02-02 ENCOUNTER — Ambulatory Visit (HOSPITAL_BASED_OUTPATIENT_CLINIC_OR_DEPARTMENT_OTHER): Payer: Medicare Other | Attending: Pulmonary Disease | Admitting: Radiology

## 2014-02-02 DIAGNOSIS — Z9989 Dependence on other enabling machines and devices: Principal | ICD-10-CM

## 2014-02-02 DIAGNOSIS — G4733 Obstructive sleep apnea (adult) (pediatric): Secondary | ICD-10-CM

## 2014-03-18 ENCOUNTER — Other Ambulatory Visit: Payer: Self-pay

## 2014-04-18 ENCOUNTER — Telehealth: Payer: Self-pay | Admitting: Pulmonary Disease

## 2014-04-18 NOTE — Telephone Encounter (Signed)
MyChart Message copied below:  Appointment Request From: Bethanie Dicker       With Provider: Kathee Delton, MD [facility]]      Preferred Date Range: Any date 04/18/2014 or later      Preferred Times: Any      Reason: To address the following health maintenance concerns.   Influenza Vaccine      Comments:   I have had my flu shot from Dr.Brian Richboro, my primary care doctor. I had my shot on 03-08-14 during my office visit. Please correct my status on the Network for [facility] for this Flu Shot since I have had it.I continue to receive notices to get this shot.      Thank you,   Oralia Rud

## 2014-04-18 NOTE — Telephone Encounter (Signed)
Called spoke with pt. She reports she does not want to schedule flu. She has already had this.

## 2014-05-12 ENCOUNTER — Ambulatory Visit (INDEPENDENT_AMBULATORY_CARE_PROVIDER_SITE_OTHER): Payer: Medicare Other | Admitting: Podiatry

## 2014-05-12 DIAGNOSIS — M2011 Hallux valgus (acquired), right foot: Secondary | ICD-10-CM

## 2014-05-12 DIAGNOSIS — L84 Corns and callosities: Secondary | ICD-10-CM

## 2014-05-12 DIAGNOSIS — M21611 Bunion of right foot: Secondary | ICD-10-CM

## 2014-05-12 NOTE — Progress Notes (Signed)
Subjective:     Patient ID: Felicia [name], female   DOB: Mar 05, 1941, 73 y.o.   MRN: [id number]  HPI patient presents stating I still have a callus on the plantar aspect of my right foot and I was wondering if orthotics would be of benefit   Review of Systems     Objective:   Physical Exam Neurovascular status intact with keratotic lesion sub-second metatarsal right with rheumatoid arthritis creating digital contracture and plantar flexion of the metatarsals area    Assessment:     Significant digital deformity leading to plantarflexed metatarsal callus formation and pain    Plan:     Reviewed condition and recommended orthotics to reduce the stress against the area and scanned for custom orthotic devices after we went ahead and debrided the plantar lesion. reappoint when orthotics are returned

## 2014-06-02 ENCOUNTER — Ambulatory Visit: Payer: Medicare Other | Admitting: *Deleted

## 2014-06-02 DIAGNOSIS — L84 Corns and callosities: Secondary | ICD-10-CM

## 2014-06-02 NOTE — Progress Notes (Signed)
Patient presents for orthotic pick up

## 2014-06-02 NOTE — Patient Instructions (Signed)

## 2014-11-24 ENCOUNTER — Ambulatory Visit: Payer: Medicare Other | Admitting: Pulmonary Disease

## 2014-11-28 ENCOUNTER — Other Ambulatory Visit: Payer: Self-pay

## 2014-12-05 ENCOUNTER — Other Ambulatory Visit: Payer: Self-pay | Admitting: Internal Medicine

## 2014-12-06 ENCOUNTER — Other Ambulatory Visit: Payer: Self-pay | Admitting: Emergency Medicine

## 2014-12-16 ENCOUNTER — Other Ambulatory Visit: Payer: Self-pay | Admitting: Internal Medicine

## 2014-12-21 ENCOUNTER — Encounter: Payer: Self-pay | Admitting: Gastroenterology

## 2014-12-28 ENCOUNTER — Other Ambulatory Visit: Payer: Self-pay | Admitting: Orthopaedic Surgery

## 2014-12-28 DIAGNOSIS — M25512 Pain in left shoulder: Secondary | ICD-10-CM

## 2015-01-01 ENCOUNTER — Ambulatory Visit
Admission: RE | Admit: 2015-01-01 | Discharge: 2015-01-01 | Disposition: A | Discharge status: Home / Self Care | Payer: Medicare Other | Source: Ambulatory Visit | Attending: Orthopaedic Surgery | Admitting: Orthopaedic Surgery

## 2015-01-01 DIAGNOSIS — M25512 Pain in left shoulder: Secondary | ICD-10-CM

## 2015-02-03 ENCOUNTER — Ambulatory Visit: Payer: Medicare Other | Admitting: Pulmonary Disease

## 2015-02-15 ENCOUNTER — Ambulatory Visit: Payer: Self-pay | Admitting: Internal Medicine

## 2015-02-21 ENCOUNTER — Encounter: Payer: Self-pay | Admitting: Pulmonary Disease

## 2015-02-21 ENCOUNTER — Ambulatory Visit (INDEPENDENT_AMBULATORY_CARE_PROVIDER_SITE_OTHER): Payer: Medicare Other | Admitting: Pulmonary Disease

## 2015-02-21 ENCOUNTER — Other Ambulatory Visit: Payer: Self-pay | Admitting: Orthopaedic Surgery

## 2015-02-21 VITALS — BP 120/78 | HR 74 | Ht 66.0 in | Wt 143.6 lb

## 2015-02-21 DIAGNOSIS — G4733 Obstructive sleep apnea (adult) (pediatric): Secondary | ICD-10-CM

## 2015-02-21 DIAGNOSIS — Z23 Encounter for immunization: Secondary | ICD-10-CM

## 2015-02-21 DIAGNOSIS — M25512 Pain in left shoulder: Secondary | ICD-10-CM

## 2015-02-21 NOTE — Progress Notes (Signed)
   Subjective:    Patient ID: Felicia [name], female    DOB: 1940/10/02, 74 y.o.   MRN: [id number]  HPI  74/F for followup of her obstructive sleep apnea.  she has a chronic pain syndrome from her rheumatoid arthritis that also bothers her at night. HST 02/2013:  AHI 13/hr   Chief Complaint  Patient presents with  . Sleep Apnea    Former [place] patient; patient wears CPAP every day.  Has missed a few days due to travel.  Patient states mask is rubbing and irritating her face.  Patient has a "bone" popped up on her chest Flu shot   More comfortable with wisp FF mask No problems with mask or pressure She reports good compliance, she does report improvement in quality of life and general well-being She is concerned about a prominent left clavicular bone-feels this may be related to her rheumatoid arthritis, imaging is planned  Significant tests/ events  02/2014 download -30 days  shows excellent compliance, good control of her sleep apnea, but still having significant mask leak. 02/2015 download >> no residuals, good usage, few missed days, on 10 cm, no residuals  Review of Systems  neg for any significant sore throat, dysphagia, itching, sneezing, nasal congestion or excess/ purulent secretions, fever, chills, sweats, unintended wt loss, pleuritic or exertional cp, hempoptysis, orthopnea pnd or change in chronic leg swelling. Also denies presyncope, palpitations, heartburn, abdominal pain, nausea, vomiting, diarrhea or change in bowel or urinary habits, dysuria,hematuria, rash, arthralgias, visual complaints, headache, numbness weakness or ataxia.     Objective:   Physical Exam  Gen. Pleasant, well-nourished, in no distress ENT - no lesions, no post nasal drip Neck: No JVD, no thyromegaly, no carotid bruits Lungs: no use of accessory muscles, no dullness to percussion, clear without rales or rhonchi  Cardiovascular: Rhythm regular, heart sounds  normal, no murmurs or gallops, no peripheral  edema Musculoskeletal: No deformities, no cyanosis or clubbing        Assessment & Plan:

## 2015-02-21 NOTE — Patient Instructions (Signed)
CPAP 10 cm is working well for you CPAP supplies renewed x 1 year

## 2015-02-21 NOTE — Assessment & Plan Note (Signed)
Continue CPAP of 10 cm CPAP supplies will be renewed for a year    compliance with goal of at least 4-6 hrs every night is the expectation. Advised against medications with sedative side effects Cautioned against driving when sleepy - understanding that sleepiness will vary on a day to day basis

## 2015-03-07 ENCOUNTER — Encounter: Payer: Self-pay | Admitting: Pulmonary Disease

## 2015-03-08 ENCOUNTER — Other Ambulatory Visit: Payer: Medicare Other

## 2015-03-09 ENCOUNTER — Ambulatory Visit
Admission: RE | Admit: 2015-03-09 | Discharge: 2015-03-09 | Disposition: A | Discharge status: Home / Self Care | Payer: Medicare Other | Source: Ambulatory Visit | Attending: Orthopaedic Surgery | Admitting: Orthopaedic Surgery

## 2015-03-09 DIAGNOSIS — M25512 Pain in left shoulder: Secondary | ICD-10-CM

## 2015-03-27 ENCOUNTER — Telehealth: Payer: Self-pay | Admitting: *Deleted

## 2015-03-27 ENCOUNTER — Ambulatory Visit (INDEPENDENT_AMBULATORY_CARE_PROVIDER_SITE_OTHER): Payer: Medicare Other | Admitting: Cardiology

## 2015-03-27 DIAGNOSIS — R079 Chest pain, unspecified: Secondary | ICD-10-CM | POA: Diagnosis not present

## 2015-03-27 LAB — EXERCISE TOLERANCE TEST
Estimated workload: 7.3 METS
Exercise duration (min): 6 min
Exercise duration (sec): 16 s
MPHR: 147 {beats}/min
Peak HR: 134 {beats}/min
Percent HR: 91 %
RPE: 15
Rest HR: 65 {beats}/min

## 2015-03-27 NOTE — Telephone Encounter (Signed)
Stress test abnormal.  Pt has been scheduled to see Richardson Dopp tomorrow (03/28/15) at 11:30.  I placed call to pt to make her aware of appt.  Left message to call office early tomorrow.

## 2015-03-27 NOTE — Progress Notes (Signed)
Cardiology Office Note   Date:  03/28/2015   ID:  Felicia [name], DOB 1940-08-26, MRN [id number]  PCP:  Thressa Sheller, MD  Cardiologist:  New - Dr. Daneen Schick   Electrophysiologist:  n/a  Chief Complaint  Patient presents with  . Follow-up    Abnormal Stress Test     History of Present Illness: Felicia [name] is a 74 y.o. female with a hx of HTN, RA, GERD, OSA on CPAP.    She was referred by her PCP for an ETT because of a recent hx of chest pain.  She has seen her orthopedist recently for [place] joint arthritis. She thought her chest pain was coming from this.  However, she was asked to undergo stress testing to further evaluate. She notes a pressure in her chest that is constant.  She notes that this worsens with strenuous activity. She also notes DOE with mild to mod activities.  She is most troubled by decreased exercise tolerance over the past few months. She is quite fatigued.  She denies syncope, orthopnea, PND, edema.  She denies assoc nausea or diaphoresis with her chest pain.  Yesterday, she exercised 6:16 and her ECGs demonstrated 2-3 mm inferolateral ST depression. This was reviewed by Dr. Lauree Chandler yesterday and it was recommended the patient be brought back to review her stress test findings.    Studies/Reports Reviewed Today:  ETT 03/27/15  Blood pressure demonstrated a hypertensive response to exercise.  Upsloping ST segment depression ST segment depression was noted during stress in the II, V4, V5 and V6 leads.  Mr Chest Wo Contrast   03/10/2015   IMPRESSION: 1. Severe asymmetric left-sided [place] joint arthropathy. This could be asymmetric OA. An erosive arthropathy such as rheumatoid arthritis and psoriatic arthritis are possibilities also. 2. No surrounding inflammation/myositis or cellulitis to suggest septic arthritis. Electronically Signed   By: Marijo Sanes M.D.   On: 03/10/2015 08:34   Echo 4/15 Vigorous LVF, EF 65-70%, normal wall motion, normal  diastolic function, trivial AI, mild TR  Renal Art [place] 5/13 IMPRESSION: No Doppler evidence of hemodynamically significant renal artery stenosis.     Past Medical History  Diagnosis Date  . GERD (gastroesophageal reflux disease)   . Rheumatoid arthritis(714.0)     [place]; Methotrexate (Dr. Patrecia Pour)  . Glaucoma   . Hypertension   . Complication of anesthesia     small airway per patient  . Hiatal hernia   . Adenomatous colon polyp 2004    Colo in 2009 "no polyps"  . Sleep apnea     on CPAP    Past Surgical History  Procedure Laterality Date  . Cataract extraction, bilateral    . Trabeculectomy      X 2 OD; X 1 OS  . Upper gastrointestinal endoscopy      hiatal hernia  . Colonoscopy w/ polypectomy      X1; negative subsequently  . Dilation and curettage of uterus    . Hysteroscopy w/d&c  06/24/2012    Procedure: DILATATION AND CURETTAGE /HYSTEROSCOPY;  Surgeon: [name], MD;  Location: [facility];  Service: Gynecology;  Laterality: N/A;  . Shoulder surgery Left     due to RA  . Eye surgery      due to RA     Current Outpatient Prescriptions  Medication Sig Dispense Refill  . AMBULATORY NON FORMULARY MEDICATION as directed. CPAP @@ night    . aspirin 81 MG tablet Take 81 mg by mouth  daily.     . brimonidine (ALPHAGAN) 0.2 % ophthalmic solution 3 (three) times daily. As directed    . Calcium Carbonate-Vitamin D (CALTRATE 600+D) 600-400 MG-UNIT per tablet Take 1 tablet by mouth daily.    . Carboxymethylcellulose Sodium (REFRESH OP) Apply 1 drop to eye daily as needed (FOR DRY EYES).     . Cholecalciferol (VITAMIN D3) 1000 UNITS CAPS Take by mouth daily.    Marland Kitchen esomeprazole (NEXIUM) 40 MG capsule take 1 capsule by mouth twice a day BEFORE A MEAL 180 capsule 3  . estradiol (ESTRACE) 1 MG tablet Take 1 mg by mouth daily.  0  . estrogens, conjugated, (PREMARIN) 0.625 MG tablet Take 0.625 mg by mouth every evening.     . folic acid (FOLVITE) 1 MG tablet Take 2 mg by  mouth daily.    . Iron TABS Take 1 tablet by mouth daily. Taking every other day.. How often??    Astrid Drafts Omega-3 300 MG CAPS Take 1 capsule by mouth daily.    Marland Kitchen latanoprost (XALATAN) 0.005 % ophthalmic solution Place 1 drop into both eyes at bedtime.     Marland Kitchen losartan (COZAAR) 100 MG tablet Take 100 mg by mouth daily.    Marland Kitchen METHOTREXATE SODIUM IJ Inject 1 mL as directed once a week. Sunday    . metoprolol (LOPRESSOR) 50 MG tablet Take 50 mg by mouth 2 (two) times daily.   0  . metroNIDAZOLE (METROGEL) 1 % gel Apply 1 application topically every evening.     . Misc Natural Products (TART CHERRY ADVANCED PO) Take by mouth. Take 2 tbsp by mouth in liquid once daily    . Multiple Vitamins-Minerals (CENTRUM SILVER PO) Take 1 capsule by mouth daily.     Marland Kitchen ORENCIA 125 MG/ML SOSY Inject 125 mg into the skin once a week.    . Probiotic Product (ALIGN PO) Take 1 tablet by mouth every evening.    . progesterone (PROMETRIUM) 100 MG capsule Take 100 mg by mouth every evening.     . progesterone (PROMETRIUM) 200 MG capsule Take 200 mg by mouth daily.   0  . Psyllium (METAMUCIL PO) Take by mouth. 1 tbs by mouth once daily    . timolol (TIMOPTIC) 0.5 % ophthalmic solution Place 1 drop into both eyes as directed.     . traMADol (ULTRAM) 50 MG tablet Take 50 mg by mouth at bedtime as needed (pain).     . nitroGLYCERIN (NITROSTAT) 0.4 MG SL tablet Place 1 tablet (0.4 mg total) under the tongue every 5 (five) minutes as needed for chest pain. 25 tablet 6   No current facility-administered medications for this visit.    Allergies:   Sulfonamide derivatives; Zostavax; and Norvasc    Social History:   Social History   Social History  . Marital Status: Single    Spouse Name: N/A  . Number of Children: 0  . Years of Education: N/A   Occupational History  . 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 Smoking status: Former Smoker  -- 0.50 packs/day for 5 years    Types: Cigarettes    Quit date: 06/04/1971  . Smokeless tobacco: Never Used     Comment: smoked about 3-5 years , up to 1/2 ppd  . Alcohol Use: No  . Drug Use: No  . Sexual Activity: Yes    Birth Control/ Protection: Post-menopausal  Other Topics Concern  . None   Social History Narrative    Family History:  The patient's family history includes Dementia in her mother; Heart attack (age of onset: 42) in her father; Heart disease in her mother; Lung disease in her mother; Osteoporosis in her mother; Stroke (age of onset: 76) in her maternal grandmother. There is no history of Diabetes or Cancer.    ROS:   Please see the history of present illness.   Review of Systems  Constitution: Positive for chills.  Cardiovascular: Positive for chest pain and dyspnea on exertion.  Hematologic/Lymphatic: Bruises/bleeds easily.  Musculoskeletal: Positive for joint pain and joint swelling.  Neurological: Positive for dizziness and loss of balance.  All other systems reviewed and are negative.    PHYSICAL EXAM: VS:  BP 158/78 mmHg  Pulse 64  Ht 5\' 6"  (1.676 m)  Wt 143 lb (64.864 kg)  BMI 23.09 kg/m2  SpO2 95%    Wt Readings from Last 3 Encounters:  03/28/15 143 lb (64.864 kg)  02/21/15 143 lb 9.6 oz (65.137 kg)  02/01/14 144 lb 6.4 oz (65.499 kg)     GEN: Well nourished, well developed, in no acute distress HEENT: normal Neck: no JVD, no carotid bruits, no masses Cardiac:  Normal S1/S2, RRR; no murmur ,  no rubs or gallops, no edema   Respiratory:  clear to auscultation bilaterally, no wheezing, rhonchi or rales. GI: soft, nontender, nondistended, + BS MS: no deformity or atrophy Skin: warm and dry  Neuro:  CNs II-XII intact, Strength and sensation are intact Psych: Normal affect   EKG:  EKG is not ordered today.   ECG from 10/14 demonstrates:   NSR, HR 65, LVH, NSSTTW changes   Recent Labs: No results found for requested labs within last  365 days.    Lipid Panel    Component Value Date/Time   CHOL 200 09/02/2012 0826   TRIG 104.0 09/02/2012 0826   HDL 69.70 09/02/2012 0826   CHOLHDL 3 09/02/2012 0826   VLDL 20.8 09/02/2012 0826   LDLCALC 110* 09/02/2012 0826   LDLDIRECT 160.4 12/14/2009 1206      ASSESSMENT AND PLAN:  1. Exertional Chest Pain:  Patient has risk factors of HTN, FHx, age, gender.  She has had fairly constant chest symptoms for 2 weeks that get worse with activity.  She also notes decreased exercise tolerance and DOE.  She underwent stress testing yesterday that was abnormal with inf-lat ST depression at peak exercise.  BP response was hypertensive.  She has a high pretest probability of CAD and therefore, I have recommended proceeding with cardiac cath.  I discussed this with Dr. Daneen Schick (DOD).  He agreed.  Risks and benefits of cardiac catheterization have been discussed with the patient.  These include bleeding, infection, kidney damage, stroke, heart attack, death.  The patient understands these risks and is willing to proceed.   -  Proceed with LHC tomorrow with Dr. Daneen Schick   -  Continue ASA, beta-blocker.  -  Rx for prn NTG given  -  Start statin Rx if + CAD on cath.  2. HTN:  BP uncontrolled.  Cardiac cath to be done tomorrow. Consider adding Amlodipine if BP remains elevated.   3. Rheumatoid Arthritis:  She has significant L  joint arthropathy.  Even if she has obstructive CAD that is revascularized, she will likely have some residual discomfort in this area.  We discussed this today.  4. OSA:  Continue CPAP.  5. Post Menopausal Syndrome:  Will need to DC estrogen if cath with + CAD.     Medication Changes: Current medicines are reviewed at length with the patient today.  Concerns regarding medicines are as outlined above.  The following changes have been made:   Discontinued Medications   METOPROLOL TARTRATE (LOPRESSOR) 25 MG TABLET    take 1 tablet by mouth twice a day    TIMOLOL (BETIMOL) 0.5 % OPHTHALMIC SOLUTION    Place 1 drop into the right eye daily.     TURMERIC PO    Take 1,050 mg by mouth daily.   Modified Medications   No medications on file   New Prescriptions   NITROGLYCERIN (NITROSTAT) 0.4 MG SL TABLET    Place 1 tablet (0.4 mg total) under the tongue every 5 (five) minutes as needed for chest pain.   Labs/ tests ordered today include:   Orders Placed This Encounter  Procedures  . CBC w/Diff  . INR/PT  . Basic Metabolic Panel (BMET)     Disposition:    FU with Dr. Daneen Schick after cardiac cath.     Signed, Versie Starks, MHS 03/28/2015 12:52 PM    [facility] [place], [place], [place]  [address] Phone: [phone]; Fax: [phone]

## 2015-03-28 ENCOUNTER — Encounter: Payer: Self-pay | Admitting: Physician Assistant

## 2015-03-28 ENCOUNTER — Ambulatory Visit (INDEPENDENT_AMBULATORY_CARE_PROVIDER_SITE_OTHER): Payer: Medicare Other | Admitting: Physician Assistant

## 2015-03-28 ENCOUNTER — Encounter: Payer: Self-pay | Admitting: *Deleted

## 2015-03-28 VITALS — BP 158/78 | HR 64 | Ht 66.0 in | Wt 143.0 lb

## 2015-03-28 DIAGNOSIS — R9439 Abnormal result of other cardiovascular function study: Secondary | ICD-10-CM | POA: Diagnosis not present

## 2015-03-28 DIAGNOSIS — M069 Rheumatoid arthritis, unspecified: Secondary | ICD-10-CM

## 2015-03-28 DIAGNOSIS — Z01812 Encounter for preprocedural laboratory examination: Secondary | ICD-10-CM

## 2015-03-28 DIAGNOSIS — I10 Essential (primary) hypertension: Secondary | ICD-10-CM | POA: Diagnosis not present

## 2015-03-28 DIAGNOSIS — G4733 Obstructive sleep apnea (adult) (pediatric): Secondary | ICD-10-CM

## 2015-03-28 DIAGNOSIS — R079 Chest pain, unspecified: Secondary | ICD-10-CM | POA: Diagnosis not present

## 2015-03-28 LAB — BASIC METABOLIC PANEL
BUN: 13 mg/dL (ref 7–25)
CO2: 25 mmol/L (ref 20–31)
Calcium: 9.9 mg/dL (ref 8.6–10.4)
Chloride: 97 mmol/L — ABNORMAL LOW (ref 98–110)
Creat: 0.8 mg/dL (ref 0.60–0.93)
Glucose, Bld: 94 mg/dL (ref 65–99)
Potassium: 4.6 mmol/L (ref 3.5–5.3)
Sodium: 130 mmol/L — ABNORMAL LOW (ref 135–146)

## 2015-03-28 LAB — PROTIME-INR
INR: 0.94 (ref <1.50)
Prothrombin Time: 12.7 seconds (ref 11.6–15.2)

## 2015-03-28 LAB — CBC WITH DIFFERENTIAL/PLATELET
Basophils Absolute: 0 10*3/uL (ref 0.0–0.1)
Basophils Relative: 0 % (ref 0–1)
Eosinophils Absolute: 0 10*3/uL (ref 0.0–0.7)
Eosinophils Relative: 0 % (ref 0–5)
HCT: 35.8 % — ABNORMAL LOW (ref 36.0–46.0)
Hemoglobin: 12.4 g/dL (ref 12.0–15.0)
Lymphocytes Relative: 20 % (ref 12–46)
Lymphs Abs: 2 10*3/uL (ref 0.7–4.0)
MCH: 31.4 pg (ref 26.0–34.0)
MCHC: 34.6 g/dL (ref 30.0–36.0)
MCV: 90.6 fL (ref 78.0–100.0)
MPV: 8.7 fL (ref 8.6–12.4)
Monocytes Absolute: 0.6 10*3/uL (ref 0.1–1.0)
Monocytes Relative: 6 % (ref 3–12)
Neutro Abs: 7.3 10*3/uL (ref 1.7–7.7)
Neutrophils Relative %: 74 % (ref 43–77)
Platelets: 451 10*3/uL — ABNORMAL HIGH (ref 150–400)
RBC: 3.95 MIL/uL (ref 3.87–5.11)
RDW: 14.4 % (ref 11.5–15.5)
WBC: 9.8 10*3/uL (ref 4.0–10.5)

## 2015-03-28 MED ORDER — NITROGLYCERIN 0.4 MG SL SUBL: 0.4000 mg | SUBLINGUAL_TABLET | SUBLINGUAL | Status: DC | PRN

## 2015-03-28 NOTE — Telephone Encounter (Signed)
Spoke with pt and reviewed stress test results with her.  She will be here for appt with Richardson Dopp, PA today

## 2015-03-28 NOTE — Patient Instructions (Addendum)
Medication Instructions:  1) START Nitroglycerin 0.4mg . When chest pain occurs place one tablet under your tongue.  If no relief you may take another after 5 minutes and repeat again after another 5 minutes if still no relief.  After taking 3 tablets you will need to call 911 and proceed to the emergency room for evaluation.  Labwork: BMET, CBC w/ diff, INR today  Testing/Procedures: Your physician has requested that you have a cardiac catheterization. Cardiac catheterization is used to diagnose and/or treat various heart conditions. Doctors may recommend this procedure for a number of different reasons. The most common reason is to evaluate chest pain. Chest pain can be a symptom of coronary artery disease (CAD), and cardiac catheterization can show whether plaque is narrowing or blocking your heart's arteries. This procedure is also used to evaluate the valves, as well as measure the blood flow and oxygen levels in different parts of your heart. For further information please visit [email or web address]. Please follow instruction sheet, as given.   Follow-Up: Will be planned for after your cath is completed.  Any Other Special Instructions Will Be Listed Below (If Applicable).     If you need a refill on your cardiac medications before your next appointment, please call your pharmacy.

## 2015-03-28 NOTE — Addendum Note (Signed)
Addended byKathlen Mody, Nicki Reaper T on: 03/28/2015 02:27 PM   Modules accepted: Level of Service

## 2015-03-29 ENCOUNTER — Telehealth: Payer: Self-pay | Admitting: *Deleted

## 2015-03-29 ENCOUNTER — Ambulatory Visit (HOSPITAL_COMMUNITY)
Admission: RE | Admit: 2015-03-29 | Discharge: 2015-03-29 | Disposition: A | Discharge status: Home / Self Care | Payer: Medicare Other | Source: Ambulatory Visit | Attending: Interventional Cardiology | Admitting: Interventional Cardiology

## 2015-03-29 ENCOUNTER — Encounter (HOSPITAL_COMMUNITY)
Admission: RE | Disposition: A | Discharge status: Home / Self Care | Payer: Medicare Other | Source: Ambulatory Visit | Attending: Interventional Cardiology

## 2015-03-29 ENCOUNTER — Encounter (HOSPITAL_COMMUNITY): Payer: Self-pay | Admitting: Interventional Cardiology

## 2015-03-29 DIAGNOSIS — K219 Gastro-esophageal reflux disease without esophagitis: Secondary | ICD-10-CM | POA: Diagnosis not present

## 2015-03-29 DIAGNOSIS — I10 Essential (primary) hypertension: Secondary | ICD-10-CM | POA: Diagnosis not present

## 2015-03-29 DIAGNOSIS — G4733 Obstructive sleep apnea (adult) (pediatric): Secondary | ICD-10-CM | POA: Diagnosis not present

## 2015-03-29 DIAGNOSIS — Z7982 Long term (current) use of aspirin: Secondary | ICD-10-CM | POA: Diagnosis not present

## 2015-03-29 DIAGNOSIS — Z87891 Personal history of nicotine dependence: Secondary | ICD-10-CM | POA: Diagnosis not present

## 2015-03-29 DIAGNOSIS — N959 Unspecified menopausal and perimenopausal disorder: Secondary | ICD-10-CM | POA: Diagnosis not present

## 2015-03-29 DIAGNOSIS — I2510 Atherosclerotic heart disease of native coronary artery without angina pectoris: Secondary | ICD-10-CM | POA: Diagnosis not present

## 2015-03-29 DIAGNOSIS — M069 Rheumatoid arthritis, unspecified: Secondary | ICD-10-CM | POA: Diagnosis present

## 2015-03-29 DIAGNOSIS — R079 Chest pain, unspecified: Secondary | ICD-10-CM | POA: Diagnosis present

## 2015-03-29 HISTORY — PX: CARDIAC CATHETERIZATION: SHX172

## 2015-03-29 SURGERY — LEFT HEART CATH AND CORONARY ANGIOGRAPHY
Anesthesia: LOCAL

## 2015-03-29 MED ORDER — LIDOCAINE HCL (PF) 1 % IJ SOLN
INTRAMUSCULAR | Status: DC | PRN
  Administered 2015-03-29: 09:00:00

## 2015-03-29 MED ORDER — HEPARIN SODIUM (PORCINE) 1000 UNIT/ML IJ SOLN
INTRAMUSCULAR | Status: AC
  Filled 2015-03-29: qty 1

## 2015-03-29 MED ORDER — SODIUM CHLORIDE 0.9 % WEIGHT BASED INFUSION: 3.0000 mL/kg/h | INTRAVENOUS | Status: DC

## 2015-03-29 MED ORDER — SODIUM CHLORIDE 0.9 % IJ SOLN: 3.0000 mL | INTRAMUSCULAR | Status: DC | PRN

## 2015-03-29 MED ORDER — SODIUM CHLORIDE 0.9 % IV SOLN
INTRAVENOUS | Status: DC
  Administered 2015-03-29: 07:00:00 via INTRAVENOUS

## 2015-03-29 MED ORDER — ONDANSETRON HCL 4 MG/2ML IJ SOLN: 4.0000 mg | Freq: Four times a day (QID) | INTRAMUSCULAR | Status: DC | PRN

## 2015-03-29 MED ORDER — SODIUM CHLORIDE 0.9 % IV SOLN: 250.0000 mL | INTRAVENOUS | Status: DC | PRN

## 2015-03-29 MED ORDER — VERAPAMIL HCL 2.5 MG/ML IV SOLN
INTRAVENOUS | Status: AC
  Filled 2015-03-29: qty 2

## 2015-03-29 MED ORDER — ASPIRIN 81 MG PO CHEW: 81.0000 mg | CHEWABLE_TABLET | ORAL | Status: DC

## 2015-03-29 MED ORDER — SODIUM CHLORIDE 0.9 % IJ SOLN: 3.0000 mL | Freq: Two times a day (BID) | INTRAMUSCULAR | Status: DC

## 2015-03-29 MED ORDER — MIDAZOLAM HCL 2 MG/2ML IJ SOLN
INTRAMUSCULAR | Status: DC | PRN
  Administered 2015-03-29 (×2): 1 mg via INTRAVENOUS

## 2015-03-29 MED ORDER — OXYCODONE-ACETAMINOPHEN 5-325 MG PO TABS: 1.0000 | ORAL_TABLET | ORAL | Status: DC | PRN

## 2015-03-29 MED ORDER — FENTANYL CITRATE (PF) 100 MCG/2ML IJ SOLN
INTRAMUSCULAR | Status: DC | PRN
  Administered 2015-03-29: 50 ug via INTRAVENOUS

## 2015-03-29 MED ORDER — ACETAMINOPHEN 325 MG PO TABS: 650.0000 mg | ORAL_TABLET | ORAL | Status: DC | PRN

## 2015-03-29 MED ORDER — IOHEXOL 350 MG/ML SOLN
INTRAVENOUS | Status: DC | PRN
  Administered 2015-03-29: 80 mL via INTRAVENOUS

## 2015-03-29 MED ORDER — VERAPAMIL HCL 2.5 MG/ML IV SOLN
INTRAVENOUS | Status: DC | PRN
  Administered 2015-03-29: 10 mL via INTRA_ARTERIAL

## 2015-03-29 MED ORDER — LIDOCAINE HCL (PF) 1 % IJ SOLN
INTRAMUSCULAR | Status: AC
  Filled 2015-03-29: qty 30

## 2015-03-29 MED ORDER — MIDAZOLAM HCL 2 MG/2ML IJ SOLN
INTRAMUSCULAR | Status: AC
  Filled 2015-03-29: qty 4

## 2015-03-29 MED ORDER — FENTANYL CITRATE (PF) 100 MCG/2ML IJ SOLN
INTRAMUSCULAR | Status: AC
  Filled 2015-03-29: qty 4

## 2015-03-29 MED ORDER — HEPARIN (PORCINE) IN NACL 2-0.9 UNIT/ML-% IJ SOLN
INTRAMUSCULAR | Status: AC
  Filled 2015-03-29: qty 1500

## 2015-03-29 SURGICAL SUPPLY — 10 items

## 2015-03-29 NOTE — Discharge Instructions (Addendum)
Radial Site Care °Refer to this sheet in the next few weeks. These instructions provide you with information about caring for yourself after your procedure. Your health care provider may also give you more specific instructions. Your treatment has been planned according to current medical practices, but problems sometimes occur. Call your health care provider if you have any problems or questions after your procedure. °WHAT TO EXPECT AFTER THE PROCEDURE °After your procedure, it is typical to have the following: °· Bruising at the radial site that usually fades within 1-2 weeks. °· Blood collecting in the tissue (hematoma) that may be painful to the touch. It should usually decrease in size and tenderness within 1-2 weeks. °HOME CARE INSTRUCTIONS °· Take medicines only as directed by your health care provider. °· You may shower 24-48 hours after the procedure or as directed by your health care provider. Remove the bandage (dressing) and gently wash the site with plain soap and water. Pat the area dry with a clean towel. Do not rub the site, because this may cause bleeding. °· Do not take baths, swim, or use a hot tub until your health care provider approves. °· Check your insertion site every day for redness, swelling, or drainage. °· Do not apply powder or lotion to the site. °· Do not flex or bend the affected arm for 24 hours or as directed by your health care provider. °· Do not push or pull heavy objects with the affected arm for 24 hours or as directed by your health care provider. °· Do not lift over 10 lb (4.5 kg) for 5 days after your procedure or as directed by your health care provider. °· Ask your health care provider when it is okay to: °¨ Return to work or school. °¨ Resume usual physical activities or sports. °¨ Resume sexual activity. °· Do not drive home if you are discharged the same day as the procedure. Have someone else drive you. °· You may drive 24 hours after the procedure unless otherwise  instructed by your health care provider. °· Do not operate machinery or power tools for 24 hours after the procedure. °· If your procedure was done as an outpatient procedure, which means that you went home the same day as your procedure, a responsible adult should be with you for the first 24 hours after you arrive home. °· Keep all follow-up visits as directed by your health care provider. This is important. °SEEK MEDICAL CARE IF: °· You have a fever. °· You have chills. °· You have increased bleeding from the radial site. Hold pressure on the site. °SEEK IMMEDIATE MEDICAL CARE IF: °· You have unusual pain at the radial site. °· You have redness, warmth, or swelling at the radial site. °· You have drainage (other than a small amount of blood on the dressing) from the radial site. °· The radial site is bleeding, and the bleeding does not stop after 30 minutes of holding steady pressure on the site. °· Your arm or hand becomes pale, cool, tingly, or numb. °  °This information is not intended to replace advice given to you by your health care provider. Make sure you discuss any questions you have with your health care provider. °  °Document Released: 06/22/2010 Document Revised: 06/10/2014 Document Reviewed: 12/06/2013 °Elsevier Interactive Patient Education ©2016 [employer] ° °

## 2015-03-29 NOTE — Telephone Encounter (Signed)
Pt notified of lab results by phone with verbal understanding.  

## 2015-03-29 NOTE — H&P (View-Only) (Signed)
Cardiology Office Note   Date:  03/28/2015   ID:  MIMA CRANMORE, DOB 1940/09/09, MRN [id number]  PCP:  Thressa Sheller, MD  Cardiologist:  New - Dr. Daneen Schick   Electrophysiologist:  n/a  Chief Complaint  Patient presents with  . Follow-up    Abnormal Stress Test     History of Present Illness: Felicia [name] is a 74 y.o. female with a hx of HTN, RA, GERD, OSA on CPAP.    She was referred by her PCP for an ETT because of a recent hx of chest pain.  She has seen her orthopedist recently for L [place] joint arthritis. She thought her chest pain was coming from this.  However, she was asked to undergo stress testing to further evaluate. She notes a pressure in her chest that is constant.  She notes that this worsens with strenuous activity. She also notes DOE with mild to mod activities.  She is most troubled by decreased exercise tolerance over the past few months. She is quite fatigued.  She denies syncope, orthopnea, PND, edema.  She denies assoc nausea or diaphoresis with her chest pain.  Yesterday, she exercised 6:16 and her ECGs demonstrated 2-3 mm inferolateral ST depression. This was reviewed by Dr. Lauree Chandler yesterday and it was recommended the patient be brought back to review her stress test findings.    Studies/Reports Reviewed Today:  ETT 03/27/15  Blood pressure demonstrated a hypertensive response to exercise.  Upsloping ST segment depression ST segment depression was noted during stress in the II, V4, V5 and V6 leads.  Mr Chest Wo Contrast   03/10/2015   IMPRESSION: 1. Severe asymmetric left-sided [place] joint arthropathy. This could be asymmetric OA. An erosive arthropathy such as rheumatoid arthritis and psoriatic arthritis are possibilities also. 2. No surrounding inflammation/myositis or cellulitis to suggest septic arthritis. Electronically Signed   By: Marijo Sanes M.D.   On: 03/10/2015 08:34   Echo 4/15 Vigorous LVF, EF 65-70%, normal wall motion, normal  diastolic function, trivial AI, mild TR  Renal Art [place] 5/13 IMPRESSION: No Doppler evidence of hemodynamically significant renal artery stenosis.     Past Medical History  Diagnosis Date  . GERD (gastroesophageal reflux disease)   . Rheumatoid arthritis(714.0)     [place]; Methotrexate (Dr. Patrecia Pour)  . Glaucoma   . Hypertension   . Complication of anesthesia     small airway per patient  . Hiatal hernia   . Adenomatous colon polyp 2004    Colo in 2009 "no polyps"  . Sleep apnea     on CPAP    Past Surgical History  Procedure Laterality Date  . Cataract extraction, bilateral    . Trabeculectomy      X 2 OD; X 1 OS  . Upper gastrointestinal endoscopy      hiatal hernia  . Colonoscopy w/ polypectomy      X1; negative subsequently  . Dilation and curettage of uterus    . Hysteroscopy w/d&c  06/24/2012    Procedure: DILATATION AND CURETTAGE /HYSTEROSCOPY;  Surgeon: [name], MD;  Location: [facility];  Service: Gynecology;  Laterality: N/A;  . Shoulder surgery Left     due to RA  . Eye surgery      due to RA     Current Outpatient Prescriptions  Medication Sig Dispense Refill  . AMBULATORY NON FORMULARY MEDICATION as directed. CPAP @@ night    . aspirin 81 MG tablet Take 81 mg by mouth  daily.     . brimonidine (ALPHAGAN) 0.2 % ophthalmic solution 3 (three) times daily. As directed    . Calcium Carbonate-Vitamin D (CALTRATE 600+D) 600-400 MG-UNIT per tablet Take 1 tablet by mouth daily.    . Carboxymethylcellulose Sodium (REFRESH OP) Apply 1 drop to eye daily as needed (FOR DRY EYES).     . Cholecalciferol (VITAMIN D3) 1000 UNITS CAPS Take by mouth daily.    Marland Kitchen esomeprazole (NEXIUM) 40 MG capsule take 1 capsule by mouth twice a day BEFORE A MEAL 180 capsule 3  . estradiol (ESTRACE) 1 MG tablet Take 1 mg by mouth daily.  0  . estrogens, conjugated, (PREMARIN) 0.625 MG tablet Take 0.625 mg by mouth every evening.     . folic acid (FOLVITE) 1 MG tablet Take 2 mg by  mouth daily.    . Iron TABS Take 1 tablet by mouth daily. Taking every other day.. How often??    Astrid Drafts Omega-3 300 MG CAPS Take 1 capsule by mouth daily.    Marland Kitchen latanoprost (XALATAN) 0.005 % ophthalmic solution Place 1 drop into both eyes at bedtime.     Marland Kitchen losartan (COZAAR) 100 MG tablet Take 100 mg by mouth daily.    Marland Kitchen METHOTREXATE SODIUM IJ Inject 1 mL as directed once a week. Sunday    . metoprolol (LOPRESSOR) 50 MG tablet Take 50 mg by mouth 2 (two) times daily.   0  . metroNIDAZOLE (METROGEL) 1 % gel Apply 1 application topically every evening.     . Misc Natural Products (TART CHERRY ADVANCED PO) Take by mouth. Take 2 tbsp by mouth in liquid once daily    . Multiple Vitamins-Minerals (CENTRUM SILVER PO) Take 1 capsule by mouth daily.     Marland Kitchen ORENCIA 125 MG/ML SOSY Inject 125 mg into the skin once a week.    . Probiotic Product (ALIGN PO) Take 1 tablet by mouth every evening.    . progesterone (PROMETRIUM) 100 MG capsule Take 100 mg by mouth every evening.     . progesterone (PROMETRIUM) 200 MG capsule Take 200 mg by mouth daily.   0  . Psyllium (METAMUCIL PO) Take by mouth. 1 tbs by mouth once daily    . timolol (TIMOPTIC) 0.5 % ophthalmic solution Place 1 drop into both eyes as directed.     . traMADol (ULTRAM) 50 MG tablet Take 50 mg by mouth at bedtime as needed (pain).     . nitroGLYCERIN (NITROSTAT) 0.4 MG SL tablet Place 1 tablet (0.4 mg total) under the tongue every 5 (five) minutes as needed for chest pain. 25 tablet 6   No current facility-administered medications for this visit.    Allergies:   Sulfonamide derivatives; Zostavax; and Norvasc    Social History:   Social History   Social History  . Marital Status: Single    Spouse Name: N/A  . Number of Children: 0  . Years of Education: N/A   Occupational History  . 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 Smoking status: Former Smoker  -- 0.50 packs/day for 5 years    Types: Cigarettes    Quit date: 06/04/1971  . Smokeless tobacco: Never Used     Comment: smoked about 3-5 years , up to 1/2 ppd  . Alcohol Use: No  . Drug Use: No  . Sexual Activity: Yes    Birth Control/ Protection: Post-menopausal  Other Topics Concern  . None   Social History Narrative    Family History:  The patient's family history includes Dementia in her mother; Heart attack (age of onset: 21) in her father; Heart disease in her mother; Lung disease in her mother; Osteoporosis in her mother; Stroke (age of onset: 70) in her maternal grandmother. There is no history of Diabetes or Cancer.    ROS:   Please see the history of present illness.   Review of Systems  Constitution: Positive for chills.  Cardiovascular: Positive for chest pain and dyspnea on exertion.  Hematologic/Lymphatic: Bruises/bleeds easily.  Musculoskeletal: Positive for joint pain and joint swelling.  Neurological: Positive for dizziness and loss of balance.  All other systems reviewed and are negative.    PHYSICAL EXAM: VS:  BP 158/78 mmHg  Pulse 64  Ht 5\' 6"  (1.676 m)  Wt 143 lb (64.864 kg)  BMI 23.09 kg/m2  SpO2 95%    Wt Readings from Last 3 Encounters:  03/28/15 143 lb (64.864 kg)  02/21/15 143 lb 9.6 oz (65.137 kg)  02/01/14 144 lb 6.4 oz (65.499 kg)     GEN: Well nourished, well developed, in no acute distress HEENT: normal Neck: no JVD, no carotid bruits, no masses Cardiac:  Normal S1/S2, RRR; no murmur ,  no rubs or gallops, no edema   Respiratory:  clear to auscultation bilaterally, no wheezing, rhonchi or rales. GI: soft, nontender, nondistended, + BS MS: no deformity or atrophy Skin: warm and dry  Neuro:  CNs II-XII intact, Strength and sensation are intact Psych: Normal affect   EKG:  EKG is not ordered today.   ECG from 10/14 demonstrates:   NSR, HR 65, LVH, NSSTTW changes   Recent Labs: No results found for requested labs within last  365 days.    Lipid Panel    Component Value Date/Time   CHOL 200 09/02/2012 0826   TRIG 104.0 09/02/2012 0826   HDL 69.70 09/02/2012 0826   CHOLHDL 3 09/02/2012 0826   VLDL 20.8 09/02/2012 0826   LDLCALC 110* 09/02/2012 0826   LDLDIRECT 160.4 12/14/2009 1206      ASSESSMENT AND PLAN:  1. Exertional Chest Pain:  Patient has risk factors of HTN, FHx, age, gender.  She has had fairly constant chest symptoms for 2 weeks that get worse with activity.  She also notes decreased exercise tolerance and DOE.  She underwent stress testing yesterday that was abnormal with inf-lat ST depression at peak exercise.  BP response was hypertensive.  She has a high pretest probability of CAD and therefore, I have recommended proceeding with cardiac cath.  I discussed this with Dr. Daneen Schick (DOD).  He agreed.  Risks and benefits of cardiac catheterization have been discussed with the patient.  These include bleeding, infection, kidney damage, stroke, heart attack, death.  The patient understands these risks and is willing to proceed.   -  Proceed with LHC tomorrow with Dr. Daneen Schick   -  Continue ASA, beta-blocker.  -  Rx for prn NTG given  -  Start statin Rx if + CAD on cath.  2. HTN:  BP uncontrolled.  Cardiac cath to be done tomorrow. Consider adding Amlodipine if BP remains elevated.   3. Rheumatoid Arthritis:  She has significant L [place] joint arthropathy.  Even if she has obstructive CAD that is revascularized, she will likely have some residual discomfort in this area.  We discussed this today.  4. OSA:  Continue CPAP.  5. Post Menopausal Syndrome:  Will need to DC estrogen if cath with + CAD.     Medication Changes: Current medicines are reviewed at length with the patient today.  Concerns regarding medicines are as outlined above.  The following changes have been made:   Discontinued Medications   METOPROLOL TARTRATE (LOPRESSOR) 25 MG TABLET    take 1 tablet by mouth twice a day    TIMOLOL (BETIMOL) 0.5 % OPHTHALMIC SOLUTION    Place 1 drop into the right eye daily.     TURMERIC PO    Take 1,050 mg by mouth daily.   Modified Medications   No medications on file   New Prescriptions   NITROGLYCERIN (NITROSTAT) 0.4 MG SL TABLET    Place 1 tablet (0.4 mg total) under the tongue every 5 (five) minutes as needed for chest pain.   Labs/ tests ordered today include:   Orders Placed This Encounter  Procedures  . CBC w/Diff  . INR/PT  . Basic Metabolic Panel (BMET)     Disposition:    FU with Dr. Daneen Schick after cardiac cath.     Signed, Versie Starks, MHS 03/28/2015 12:52 PM    [facility] [address], [place], [place]  [address] Phone: [phone]; Fax: [phone]

## 2015-03-29 NOTE — Interval H&P Note (Signed)
Cath Lab Visit (complete for each Cath Lab visit)  Clinical Evaluation Leading to the Procedure:   ACS: Yes.    Non-ACS:    Anginal Classification: CCS III  Anti-ischemic medical therapy: Minimal Therapy (1 class of medications)  Non-Invasive Test Results: Intermediate-risk stress test findings: cardiac mortality 1-3%/year  Prior CABG: No previous CABG      History and Physical Interval Note:  03/29/2015 7:52 AM  Felicia [name]  has presented today for surgery, with the diagnosis of EXCERTIONAL CP, ABNORMAL STRESS TEST  The various methods of treatment have been discussed with the patient and family. After consideration of risks, benefits and other options for treatment, the patient has consented to  Procedure(s): Left Heart Cath and Coronary Angiography (N/A) as a surgical intervention .  The patient's history has been reviewed, patient examined, no change in status, stable for surgery.  I have reviewed the patient's chart and labs.  Questions were answered to the patient's satisfaction.     Sinclair Grooms

## 2015-03-30 MED FILL — Heparin Sodium (Porcine) Inj 1000 Unit/ML: INTRAMUSCULAR | Qty: 10 | Status: AC

## 2015-04-12 ENCOUNTER — Ambulatory Visit: Payer: Medicare Other | Admitting: Physician Assistant

## 2015-04-13 ENCOUNTER — Other Ambulatory Visit: Payer: Self-pay | Admitting: Pulmonary Disease

## 2015-04-13 DIAGNOSIS — G4733 Obstructive sleep apnea (adult) (pediatric): Secondary | ICD-10-CM

## 2015-04-13 DIAGNOSIS — Z9989 Dependence on other enabling machines and devices: Principal | ICD-10-CM

## 2015-04-21 ENCOUNTER — Encounter: Payer: Self-pay | Admitting: Internal Medicine

## 2015-04-21 ENCOUNTER — Ambulatory Visit (INDEPENDENT_AMBULATORY_CARE_PROVIDER_SITE_OTHER): Payer: Medicare Other | Admitting: Internal Medicine

## 2015-04-21 VITALS — BP 128/66 | HR 64 | Ht 65.0 in | Wt 142.5 lb

## 2015-04-21 DIAGNOSIS — K219 Gastro-esophageal reflux disease without esophagitis: Secondary | ICD-10-CM | POA: Diagnosis not present

## 2015-04-21 DIAGNOSIS — Z1211 Encounter for screening for malignant neoplasm of colon: Secondary | ICD-10-CM | POA: Diagnosis not present

## 2015-04-21 DIAGNOSIS — R140 Abdominal distension (gaseous): Secondary | ICD-10-CM

## 2015-04-21 DIAGNOSIS — R151 Fecal smearing: Secondary | ICD-10-CM | POA: Diagnosis not present

## 2015-04-21 MED ORDER — ESOMEPRAZOLE MAGNESIUM 40 MG PO CPDR: DELAYED_RELEASE_CAPSULE | ORAL | Status: DC

## 2015-04-21 NOTE — Progress Notes (Signed)
   Subjective:    Patient ID: Felicia [name], female    DOB: 1940/12/09, 74 y.o.   MRN: [id number] Cc: bloating HPI Bloating increased and difficulty cleansing after defecation and may have some slight leakeage of feces w/o realization with less effective bowel mvts Defecates 1-2 x a day most days Not a lot of gas producing foods  On Align - switched to [place] last yr but had pain so back to align  Recent spell of increased heartburn but was in setting of clavicle separating from sternum due to RA Medications, allergies, past medical history, past surgical history, family history and social history are reviewed and updated in the EMR. Review of Systems As above    Objective:   Physical Exam @BP  128/66 mmHg  Pulse 64  Ht 5\' 5"  (1.651 m)  Wt 142 lb 8 oz (64.638 kg)  BMI 23.71 kg/m2@  General:  NAD Eyes:   anicteric Abdomen:  soft and nontender, BS+  Data Reviewed: Prior GI notes and endoscopic procedures    Assessment & Plan:  Gastroesophageal reflux disease without esophagitis  Bloating  Fecal soiling  Colon cancer screening   Refill nexium VSL # 3 instead of align Increase metamucil 2-3 tsp a night Screening colonoscopy The risks and benefits as well as alternatives of endoscopic procedure(s) have been discussed and reviewed. All questions answered. The patient agrees to proceed.  I appreciate the opportunity to care for this patient. [phone], MD

## 2015-04-21 NOTE — Patient Instructions (Addendum)
  You have been scheduled for a colonoscopy. Please follow written instructions given to you at your visit today.  Please pick up your prep supplies at the pharmacy within the next 1-3 days. If you use inhalers (even only as needed), please bring them with you on the day of your procedure.   Please replace your Align with VSL #3 probiotic, coupon provided.  Take 2 capsules daily.  We have sent the following medications to your pharmacy for you to pick up at your convenience: Nexium  Increase your metamucil to 2 teaspoons daily and use 3 teaspoons if 2 doesn't help.   I appreciate the opportunity to care for you Silvano Rusk, MD, [facility]

## 2015-04-23 ENCOUNTER — Encounter: Payer: Self-pay | Admitting: Internal Medicine

## 2015-06-14 ENCOUNTER — Ambulatory Visit (AMBULATORY_SURGERY_CENTER): Payer: Medicare Other | Admitting: Internal Medicine

## 2015-06-14 ENCOUNTER — Encounter: Payer: Self-pay | Admitting: Internal Medicine

## 2015-06-14 VITALS — BP 150/73 | HR 58 | Temp 97.0°F | Resp 16 | Ht 65.0 in | Wt 142.0 lb

## 2015-06-14 DIAGNOSIS — Z1211 Encounter for screening for malignant neoplasm of colon: Secondary | ICD-10-CM

## 2015-06-14 MED ORDER — SODIUM CHLORIDE 0.9 % IV SOLN: 500.0000 mL | INTRAVENOUS | Status: DC

## 2015-06-14 NOTE — Patient Instructions (Addendum)
No polyps. You have diverticulosis - please read the handout. I think this contributing to your constipation.  Please start taking MiraLax 1 dose (cap or tablespoon) daily instead of fiber supplement and adjust to promote a bowel habit that is good for you.  You do not need routine repeat colonoscopy/colon cancer screening given no polyps and your age.  I appreciate the opportunity to care for you. Gatha Mayer, MD, FACG   YOU HAD AN ENDOSCOPIC PROCEDURE TODAY AT [facility]:   Refer to the procedure report that was given to you for any specific questions about what was found during the examination.  If the procedure report does not answer your questions, please call your gastroenterologist to clarify.  If you requested that your care partner not be given the details of your procedure findings, then the procedure report has been included in a sealed envelope for you to review at your convenience later.  YOU SHOULD EXPECT: Some feelings of bloating in the abdomen. Passage of more gas than usual.  Walking can help get rid of the air that was put into your GI tract during the procedure and reduce the bloating. If you had a lower endoscopy (such as a colonoscopy or flexible sigmoidoscopy) you may notice spotting of blood in your stool or on the toilet paper. If you underwent a bowel prep for your procedure, you may not have a normal bowel movement for a few days.  Please Note:  You might notice some irritation and congestion in your nose or some drainage.  This is from the oxygen used during your procedure.  There is no need for concern and it should clear up in a day or so.  SYMPTOMS TO REPORT IMMEDIATELY:   Following lower endoscopy (colonoscopy or flexible sigmoidoscopy):  Excessive amounts of blood in the stool  Significant tenderness or worsening of abdominal pains  Swelling of the abdomen that is new, acute  Fever of 100F or higher    For urgent or emergent  issues, a gastroenterologist can be reached at any hour by calling [phone].   DIET: Your first meal following the procedure should be a small meal and then it is ok to progress to your normal diet. Heavy or fried foods are harder to digest and may make you feel nauseous or bloated.  Likewise, meals heavy in dairy and vegetables can increase bloating.  Drink plenty of fluids but you should avoid alcoholic beverages for 24 hours.  ACTIVITY:  You should plan to take it easy for the rest of today and you should NOT DRIVE or use heavy machinery until tomorrow (because of the sedation medicines used during the test).    FOLLOW UP: Our staff will call the number listed on your records the next business day following your procedure to check on you and address any questions or concerns that you may have regarding the information given to you following your procedure. If we do not reach you, we will leave a message.  However, if you are feeling well and you are not experiencing any problems, there is no need to return our call.  We will assume that you have returned to your regular daily activities without incident.  If any biopsies were taken you will be contacted by phone or by letter within the next 1-3 weeks.  Please call us at [phone] if you have not heard about the biopsies in 3 weeks.    SIGNATURES/CONFIDENTIALITY: You and/or your  care partner have signed paperwork which will be entered into your electronic medical record.  These signatures attest to the fact that that the information above on your After Visit Summary has been reviewed and is understood.  Full responsibility of the confidentiality of this discharge information lies with you and/or your care-partner.  Information on diverticulosis given to you today

## 2015-06-14 NOTE — Op Note (Signed)
[place]  [employer]. [place], [address]   COLONOSCOPY PROCEDURE REPORT  PATIENT: Felicia [name], Felicia [name]  MR#: [id number] BIRTHDATE: [birth year] , 74  yrs. old GENDER: female ENDOSCOPIST: Gatha Mayer, MD, [facility] PROCEDURE DATE:  06/14/2015 PROCEDURE:   Colonoscopy, screening First Screening Colonoscopy - Avg.  risk and is 50 yrs.  old or older - No.  Prior Negative Screening - Now for repeat screening. 10 or more years since last screening  History of Adenoma - Now for follow-up colonoscopy & has been > or = to 3 yrs.  N/A  Polyps removed today? No Recommend repeat exam, <10 yrs? No ASA CLASS:   Class III INDICATIONS:Screening for colonic neoplasia and Colorectal Neoplasm Risk Assessment for this procedure is average risk. MEDICATIONS: Propofol 200 mg IV and Monitored anesthesia care  DESCRIPTION OF PROCEDURE:   After the risks benefits and alternatives of the procedure were thoroughly explained, informed consent was obtained.  The digital rectal exam revealed no abnormalities of the rectum.   The LB [id number]  endoscope was introduced through the anus and advanced to the cecum, which was identified by both the appendix and ileocecal valve. No adverse events experienced.   The quality of the prep was good.  (MiraLax was used)  The instrument was then slowly withdrawn as the colon was fully examined. Estimated blood loss is zero unless otherwise noted in this procedure report.      COLON FINDINGS: There was severe diverticulosis noted in the sigmoid colon.   The examination was otherwise normal.  Retroflexed views revealed no abnormalities. The time to cecum = 3.8 Withdrawal time = 8.5   The scope was withdrawn and the procedure completed. COMPLICATIONS: There were no immediate complications.  ENDOSCOPIC IMPRESSION: 1.   Severe diverticulosis was noted in the sigmoid colon 2.   The examination was otherwise normal  RECOMMENDATIONS: 1.  Routine repeat  colonoscopy screening not necessary.  See me/GI as needed. 2.  Replacce fiber supplement with daily MiraLax and adjust for effect  eSigned:  Gatha Mayer, MD, [facility] 06/14/2015 10:32 AM   cc: The Patient        Dr. Thressa Sheller

## 2015-06-14 NOTE — Progress Notes (Signed)
Report to PACU, RN, vss, BBS= Clear.  

## 2015-06-15 ENCOUNTER — Telehealth: Payer: Self-pay

## 2015-06-15 NOTE — Telephone Encounter (Signed)
  Follow up Call-  Call back number 06/14/2015  Post procedure Call Back phone  # [phone]  Permission to leave phone message Yes     Patient was called for follow up after procedure on 06/14/2015. No answer at the number given. A message was left on the answering machine.

## 2015-09-28 ENCOUNTER — Ambulatory Visit (INDEPENDENT_AMBULATORY_CARE_PROVIDER_SITE_OTHER): Payer: Medicare Other | Admitting: Adult Health

## 2015-09-28 ENCOUNTER — Encounter: Payer: Self-pay | Admitting: Adult Health

## 2015-09-28 VITALS — BP 126/64 | HR 63 | Temp 97.8°F | Ht 65.5 in | Wt 143.0 lb

## 2015-09-28 DIAGNOSIS — G4733 Obstructive sleep apnea (adult) (pediatric): Secondary | ICD-10-CM

## 2015-09-28 NOTE — Patient Instructions (Signed)
Change CPAP to auto set 5-15cmH20.  Try to wear at least 4-6hr each night .  Do not drive if sleepy.  Download in 4 weeks  Follow up Dr. Elsworth Soho  In 6 months and As needed

## 2015-09-28 NOTE — Addendum Note (Signed)
Addended by: Osa Craver on: 09/28/2015 05:00 PM   Modules accepted: Orders

## 2015-09-28 NOTE — Assessment & Plan Note (Signed)
Mild OSA -complains of daytime sleepiness and pressure issues  Will make pressure changes and reevaluate in 4 weeks.   Plan  Change CPAP to auto set 5-15cmH20.  Try to wear at least 4-6hr each night .  Do not drive if sleepy.  Download in 4 weeks  Follow up Dr. Elsworth Soho  In 6 months and As needed

## 2015-09-28 NOTE — Progress Notes (Signed)
Subjective:    Patient ID: Felicia [name], female    DOB: 10-03-1940, 75 y.o.   MRN: [id number]  HPI 75 yo female with OSA   TEST  HST 02/2013 AHI 13/hr    09/28/2015 Follow up : Mild OSA Pt returns for a 6 month follow up for OSA.  pt states wears cpap at least 4hr nightly.feels pressure isn't strong enough due to waking up after 3-4 hr.  Download shows 63% usage with avg 6hr . AHI 0.1. Min leaks. On set pressure 10cmH20.  Says she was gone on vacation so did not use during this time. Says the pressure issue is causing her not to wear.  Feels tired in am . Feels sleep is fragmented in the early am.     Past Medical History  Diagnosis Date  . GERD (gastroesophageal reflux disease)   . Rheumatoid arthritis(714.0)     [place]; Methotrexate (Dr. Patrecia Pour)  . Glaucoma   . Hypertension   . Complication of anesthesia     small airway per patient  . Hiatal hernia   . Adenomatous colon polyp 2004    Colo in 2009 "no polyps"  . Sleep apnea     on CPAP   Current Outpatient Prescriptions on File Prior to Visit  Medication Sig Dispense Refill  . AMBULATORY NON FORMULARY MEDICATION as directed. CPAP @@ night    . aspirin 81 MG tablet Take 81 mg by mouth daily.     . brimonidine (ALPHAGAN) 0.2 % ophthalmic solution Place 1 drop into the left eye 2 (two) times daily. As directed    . Calcium Carbonate-Vitamin D (CALTRATE 600+D) 600-400 MG-UNIT per tablet Take 1 tablet by mouth daily.    . Carboxymethylcellulose Sodium (REFRESH OP) Apply 1 drop to eye daily as needed (FOR DRY EYES).     . Cholecalciferol (VITAMIN D3) 1000 UNITS CAPS Take 1 capsule by mouth daily.     Marland Kitchen esomeprazole (NEXIUM) 40 MG capsule take 1 capsule by mouth twice a day BEFORE A MEAL 180 capsule 3  . estradiol (ESTRACE) 1 MG tablet Take 1 mg by mouth daily.  0  . folic acid (FOLVITE) 1 MG tablet Take 2 mg by mouth daily.    . Iron TABS Take 1 tablet by mouth every other day. Reported on 06/14/2015    . Krill Oil  Omega-3 300 MG CAPS Take 1 capsule by mouth daily.    Marland Kitchen latanoprost (XALATAN) 0.005 % ophthalmic solution Place 1 drop into both eyes at bedtime.     Marland Kitchen losartan (COZAAR) 100 MG tablet Take 100 mg by mouth daily.    Marland Kitchen METHOTREXATE SODIUM IJ Inject 1 mL as directed once a week. Sunday    . metoprolol (LOPRESSOR) 50 MG tablet Take 50 mg by mouth daily.   0  . metroNIDAZOLE (METROGEL) 1 % gel Apply 1 application topically 2 (two) times daily.     . Multiple Vitamins-Minerals (CENTRUM SILVER PO) Take 1 capsule by mouth daily.     Marland Kitchen ORENCIA 125 MG/ML SOSY Inject 125 mg into the skin once a week. Tuesday    . Probiotic Product (VSL#3 DS PO) Take 2 capsules by mouth daily.    . progesterone (PROMETRIUM) 200 MG capsule Take 200 mg by mouth daily.    . Psyllium (METAMUCIL PO) 1 tbs by mouth once daily    . timolol (TIMOPTIC) 0.5 % ophthalmic solution Place 1 drop into both eyes daily.     . traMADol (  ULTRAM) 50 MG tablet Take 50 mg by mouth at bedtime as needed (pain). Reported on 06/14/2015    . valACYclovir (VALTREX) 1000 MG tablet Take 1,000 mg by mouth as needed (fever blister). Reported on 06/14/2015     No current facility-administered medications on file prior to visit.      Review of Systems Constitutional:   No  weight loss, night sweats,  Fevers, chills, fatigue, or  lassitude.  HEENT:   No headaches,  Difficulty swallowing,  Tooth/dental problems, or  Sore throat,                No sneezing, itching, ear ache, nasal congestion, post nasal drip,   CV:  No chest pain,  Orthopnea, PND, swelling in lower extremities, anasarca, dizziness, palpitations, syncope.   GI  No heartburn, indigestion, abdominal pain, nausea, vomiting, diarrhea, change in bowel habits, loss of appetite, bloody stools.   Resp: No shortness of breath with exertion or at rest.  No excess mucus, no productive cough,  No non-productive cough,  No coughing up of blood.  No change in color of mucus.  No wheezing.  No chest  wall deformity  Skin: no rash or lesions.  GU: no dysuria, change in color of urine, no urgency or frequency.  No flank pain, no hematuria   MS:  No joint pain or swelling.  No decreased range of motion.  No back pain.  Psych:  No change in mood or affect. No depression or anxiety.  No memory loss.         Objective:   Physical Exam  Filed Vitals:   09/28/15 1623  BP: 126/64  Pulse: 63  Temp: 97.8 F (36.6 C)  TempSrc: Oral  Height: 5' 5.5" (1.664 m)  Weight: 143 lb (64.864 kg)  SpO2: 95%   GEN: A/Ox3; pleasant , NAD, well nourished   HEENT:  [place]/AT,  EACs-clear, TMs-wnl, NOSE-clear, THROAT-clear, no lesions, no postnasal drip or exudate noted.  Class 2-3 MP airway   NECK:  Supple w/ fair ROM; no JVD; normal carotid impulses w/o bruits; no thyromegaly or nodules palpated; no lymphadenopathy.  RESP  Clear  P & A; w/o, wheezes/ rales/ or rhonchi.no accessory muscle use, no dullness to percussion  CARD:  RRR, no m/r/g  , no peripheral edema, pulses intact, no cyanosis or clubbing.  GI:   Soft & nt; nml bowel sounds; no organomegaly or masses detected.  Musco: Warm bil, no deformities or joint swelling noted.   Neuro: alert, no focal deficits noted.    Skin: Warm, no lesions or rashes  Drexler Maland NP-C  [facility] 09/28/2015      Assessment & Plan:

## 2015-10-02 NOTE — Progress Notes (Signed)
Reviewed & agree with plan  

## 2015-10-11 ENCOUNTER — Encounter: Payer: Self-pay | Admitting: Adult Health

## 2015-12-07 ENCOUNTER — Telehealth: Payer: Self-pay | Admitting: Internal Medicine

## 2015-12-07 MED ORDER — ESOMEPRAZOLE MAGNESIUM 40 MG PO CPDR: DELAYED_RELEASE_CAPSULE | ORAL | Status: DC

## 2015-12-07 NOTE — Telephone Encounter (Signed)
Patient contacted.  She stated that she didn't need to speak with a nurse, but "while I have you on the phone could I possible get a refill of my Nexium".  Patient notified I will send in her refill to her verified pharmacy.  She will call back for any additional questions or concerns

## 2016-02-06 ENCOUNTER — Encounter: Payer: Self-pay | Admitting: Gastroenterology

## 2016-02-08 ENCOUNTER — Ambulatory Visit (INDEPENDENT_AMBULATORY_CARE_PROVIDER_SITE_OTHER): Payer: Medicare Other | Admitting: Internal Medicine

## 2016-02-08 ENCOUNTER — Encounter: Payer: Self-pay | Admitting: Internal Medicine

## 2016-02-08 VITALS — BP 136/78 | HR 62 | Ht 64.5 in | Wt 137.0 lb

## 2016-02-08 DIAGNOSIS — K5909 Other constipation: Secondary | ICD-10-CM

## 2016-02-08 DIAGNOSIS — K5730 Diverticulosis of large intestine without perforation or abscess without bleeding: Secondary | ICD-10-CM | POA: Diagnosis not present

## 2016-02-08 DIAGNOSIS — R140 Abdominal distension (gaseous): Secondary | ICD-10-CM | POA: Diagnosis not present

## 2016-02-08 NOTE — Patient Instructions (Addendum)
  Today we are giving you a handout to read and follow on benefiber.   If you go several days without having a bowel movement use 1/2 teaspoon of Miralax.    Dr Carlean Purl recommends that you complete a bowel purge (to clean out your bowels). Please do the following: Purchase a bottle of Miralax over the counter as well as a box of 5 mg dulcolax tablets. Take 4 dulcolax tablets. Wait 1 hour. You will then drink 4 capfuls of Miralax mixed in an adequate amount of water/juice/gatorade (you may choose which of these liquids to drink) over the next 2-3 hours. You should expect results within 1 to 6 hours after completing the bowel purge.   Follow up with Dr Carlean Purl as needed.  I appreciate the opportunity to care for you. Silvano Rusk, MD, [facility]

## 2016-02-08 NOTE — Progress Notes (Signed)
Felicia [name] 75 y.o. [id number]    Assessment & Plan:   Encounter Diagnoses  Name Primary?  . constipation Yes  . Diverticulosis of large intestine without hemorrhage   . Bloating    Symptomatic diverticulosis seems likely  Change to Benefiber 2 tbsp Daily miraLax if no defecation in 2-3 d Miralax purge first for changing these therapies  She will see me or contact me as needed.   Current Outpatient Prescriptions:  .  AMBULATORY NON FORMULARY MEDICATION, as directed. CPAP @@ night, Disp: , Rfl:  .  aspirin 81 MG tablet, Take 81 mg by mouth daily. , Disp: , Rfl:  .  brimonidine (ALPHAGAN) 0.2 % ophthalmic solution, Place 1 drop into the left eye 2 (two) times daily. As directed, Disp: , Rfl:  .  Calcium Carbonate-Vitamin D (CALTRATE 600+D) 600-400 MG-UNIT per tablet, Take 1 tablet by mouth daily., Disp: , Rfl:  .  Carboxymethylcellulose Sodium (REFRESH OP), Apply 1 drop to eye daily as needed (FOR DRY EYES). , Disp: , Rfl:  .  Cholecalciferol (VITAMIN D3) 1000 UNITS CAPS, Take 1 capsule by mouth daily. , Disp: , Rfl:  .  esomeprazole (NEXIUM) 40 MG capsule, take 1 capsule by mouth twice a day BEFORE A MEAL, Disp: 180 capsule, Rfl: 3 .  estradiol (ESTRACE) 1 MG tablet, Take 1 mg by mouth daily., Disp: , Rfl: 0 .  folic acid (FOLVITE) 1 MG tablet, Take 2 mg by mouth daily., Disp: , Rfl:  .  Iron TABS, Take 1 tablet by mouth every other day. Reported on 06/14/2015, Disp: , Rfl:  .  Krill Oil Omega-3 300 MG CAPS, Take 1 capsule by mouth daily., Disp: , Rfl:  .  latanoprost (XALATAN) 0.005 % ophthalmic solution, Place 1 drop into both eyes at bedtime. , Disp: , Rfl:  .  losartan (COZAAR) 100 MG tablet, Take 100 mg by mouth daily., Disp: , Rfl:  .  METHOTREXATE SODIUM IJ, Inject 1 mL as directed once a week. Sunday, Disp: , Rfl:  .  metoprolol (LOPRESSOR) 50 MG tablet, Take 50 mg by mouth daily. , Disp: , Rfl: 0 .  metroNIDAZOLE (METROGEL) 1 % gel, Apply 1 application topically 2  (two) times daily. , Disp: , Rfl:  .  Multiple Vitamins-Minerals (CENTRUM SILVER PO), Take 1 capsule by mouth daily. , Disp: , Rfl:  .  ORENCIA 125 MG/ML SOSY, Inject 125 mg into the skin once a week. Tuesday, Disp: , Rfl:  .  progesterone (PROMETRIUM) 200 MG capsule, Take 200 mg by mouth daily., Disp: , Rfl:  .  Psyllium (METAMUCIL PO), 1 tbs by mouth once daily, Disp: , Rfl:  .  timolol (TIMOPTIC) 0.5 % ophthalmic solution, Place 1 drop into both eyes daily. , Disp: , Rfl:  .  traMADol (ULTRAM) 50 MG tablet, Take 50 mg by mouth at bedtime as needed (pain). Reported on 06/14/2015, Disp: , Rfl:  .  valACYclovir (VALTREX) 1000 MG tablet, Take 1,000 mg by mouth as needed (fever blister). Reported on 06/14/2015, Disp: , Rfl:    I appreciate the opportunity to care for this patient. CC: Thressa Sheller, MD  Subjective:   Chief Complaint: Constipation bloating and gas  HPI Is a very nice elderly woman who has a history of severe diverticulosis on a colonoscopy earlier this year, constipation and irritable bowel-like problems in the past who presents with intermittent constipation, she will miss up to 3 or 4 days without defecation. She gets bloated and distended and  gassy. Sometimes gas is malodorous and embarrassing. She is using align probiotic at this time. In the past I had recommended MiraLAX but she had a hard time getting a dose that didn't cause diarrhea. When she does use it on an intermittent basis she'll use a half a teaspoon or teaspoon with good results. She does feel better when she has a bowel movement though she does complain of incomplete defecation. She does have some urinary frequency and concerned about leaking urine, if her bladder is not empty or if it's to full she may have some stress urinary incontinence symptoms but it does not sound like that very frequent. Uses Gas-X with slight relief perhaps.  Her heartburn is well controlled on esomeprazole.  Medications, allergies, past  medical history, past surgical history, family history and social history are reviewed and updated in the EMR.   Review of Systems She had a trabeculectomy in her left eye to treat her glaucoma recently. That eyelid is closed, she has less vision out of her right eye but that someone working right now so she has limited vision temporarily. Her left clavicle is separated from her sternum, related to rheumatoid arthritis and she wonders if that or her hiatal hernia have anything to do with her GI symptoms. I.e. wondering if rheumatoid arthritis is related to her GI symptoms not the clavicle.  Objective:   Physical Exam BP 136/78   Pulse 62   Ht 5' 4.5" (1.638 m)   Wt 137 lb (62.1 kg)   BMI 23.15 kg/m  No acute distress alert and oriented 3 Felicia [name] CMA present  Abd: mildly diffuesely tender no hernias, masses, HSM  Rectal: Anoderm inspection revealed NL Anal wink was present Digital exam revealed normal resting tone and voluntary squeeze. No mass or rectocele present. Simulated defecation with valsalva revealed appropriate abdominal contraction and descent.   Data reviewed includes her colonoscopy of January 17 which showed severe diverticulosis no polyps

## 2016-02-09 ENCOUNTER — Telehealth: Payer: Self-pay | Admitting: Pulmonary Disease

## 2016-02-09 ENCOUNTER — Telehealth: Payer: Self-pay | Admitting: Internal Medicine

## 2016-02-09 NOTE — Telephone Encounter (Signed)
Spoke with patient about what to eat before her bowel purge.  I told her to eat something light and non-greasy.

## 2016-02-09 NOTE — Telephone Encounter (Signed)
Called spoke with pt. She reports she had eye surgery and is having to wear plastic covers on her eye. D/t she has not been able to use her CPAP. So for now, she is unable to use CPAP. She will go back to [facility] 9/18 for follow up on her eye. FYI to Dr. Elsworth Soho.

## 2016-02-12 NOTE — Telephone Encounter (Signed)
ok 

## 2016-02-21 ENCOUNTER — Ambulatory Visit: Payer: Medicare Other | Admitting: Adult Health

## 2016-03-06 ENCOUNTER — Ambulatory Visit (INDEPENDENT_AMBULATORY_CARE_PROVIDER_SITE_OTHER): Payer: Self-pay | Admitting: Rheumatology

## 2016-03-06 ENCOUNTER — Ambulatory Visit (INDEPENDENT_AMBULATORY_CARE_PROVIDER_SITE_OTHER): Payer: Medicare Other | Admitting: Rheumatology

## 2016-03-06 DIAGNOSIS — M1711 Unilateral primary osteoarthritis, right knee: Secondary | ICD-10-CM

## 2016-03-06 DIAGNOSIS — M1712 Unilateral primary osteoarthritis, left knee: Secondary | ICD-10-CM

## 2016-03-13 ENCOUNTER — Ambulatory Visit (INDEPENDENT_AMBULATORY_CARE_PROVIDER_SITE_OTHER): Payer: Medicare Other | Admitting: Rheumatology

## 2016-03-13 DIAGNOSIS — M1712 Unilateral primary osteoarthritis, left knee: Secondary | ICD-10-CM

## 2016-03-13 DIAGNOSIS — M1711 Unilateral primary osteoarthritis, right knee: Secondary | ICD-10-CM

## 2016-03-20 ENCOUNTER — Ambulatory Visit (INDEPENDENT_AMBULATORY_CARE_PROVIDER_SITE_OTHER): Payer: Medicare Other | Admitting: Rheumatology

## 2016-03-20 DIAGNOSIS — M1712 Unilateral primary osteoarthritis, left knee: Secondary | ICD-10-CM | POA: Diagnosis not present

## 2016-03-20 DIAGNOSIS — M1711 Unilateral primary osteoarthritis, right knee: Secondary | ICD-10-CM | POA: Diagnosis not present

## 2016-04-05 ENCOUNTER — Encounter: Payer: Self-pay | Admitting: Pulmonary Disease

## 2016-04-05 ENCOUNTER — Ambulatory Visit (INDEPENDENT_AMBULATORY_CARE_PROVIDER_SITE_OTHER): Payer: Medicare Other | Admitting: Pulmonary Disease

## 2016-04-05 DIAGNOSIS — M069 Rheumatoid arthritis, unspecified: Secondary | ICD-10-CM | POA: Diagnosis not present

## 2016-04-05 DIAGNOSIS — G4733 Obstructive sleep apnea (adult) (pediatric): Secondary | ICD-10-CM

## 2016-04-05 NOTE — Patient Instructions (Signed)
Change auto CPAP settings to 8-12 cm Call us for any issues

## 2016-04-05 NOTE — Addendum Note (Signed)
Addended by: Benson Setting L on: 04/05/2016 11:42 AM   Modules accepted: Orders

## 2016-04-05 NOTE — Assessment & Plan Note (Signed)
Hard to distinguish if daytime fatigue is related to rheumatoid arthritis or to OSA

## 2016-04-05 NOTE — Assessment & Plan Note (Signed)
Change auto CPAP settings to 8-12 cm We discussed with the cardiovascular implications of mild OSA is minimal decrease in to treat his for improvement in sleepiness and tiredness-  Weight loss encouraged, compliance with goal of at least 4-6 hrs every night is the expectation. Advised against medications with sedative side effects Cautioned against driving when sleepy - understanding that sleepiness will vary on a day to day basis

## 2016-04-05 NOTE — Progress Notes (Signed)
   Subjective:    Patient ID: Felicia [name], female    DOB: 1941-05-15, 75 y.o.   MRN: [id number]  HPI  75/F for followup of her obstructive sleep apnea.  she has a chronic pain syndrome from her rheumatoid arthritis that also bothers her at night.   04/05/2016  Chief Complaint  Patient presents with  . Follow-up    Pt. uses a CPAP machine, Pt. states she is doing well, Pt. would like to discuss the pressure, Pt. uses it atleast 6-7 hours,    She was changed to auto settings 5-15 cm on her last visit due to feeling of high pressure She now feels that the pressure may be too low Head eye surgery and did not use the machine for a few weeks but is now back on it and download shows good usage over the last 2 weeks with no residual events and no leak, with average pressure of 12 cm on auto settings 5-15 cm  More comfortable with wisp FF mask No problems with mask or pressure   Significant tests/ events  HST 02/2013:  AHI 13/hr 02/2015 download >> no residuals, good usage, few missed days, on 10 cm, no residuals     Review of Systems Patient denies significant dyspnea,cough, hemoptysis,  chest pain, palpitations, pedal edema, orthopnea, paroxysmal nocturnal dyspnea, lightheadedness, nausea, vomiting, abdominal or  leg pains      Objective:   Physical Exam  Gen. Pleasant, well-nourished, in no distress ENT - no lesions, no post nasal drip Neck: No JVD, no thyromegaly, no carotid bruits Lungs: no use of accessory muscles, no dullness to percussion, clear without rales or rhonchi  Cardiovascular: Rhythm regular, heart sounds  normal, no murmurs or gallops, no peripheral edema Musculoskeletal: No deformities, no cyanosis or clubbing        Assessment & Plan:

## 2016-04-16 ENCOUNTER — Other Ambulatory Visit: Payer: Self-pay | Admitting: Rheumatology

## 2016-04-16 NOTE — Telephone Encounter (Signed)
Last visit 03/20/16 Next visit 05/29/16 Labs 02/29/16 Ok to refill per Dr Estanislado Pandy

## 2016-04-19 ENCOUNTER — Encounter: Payer: Self-pay | Admitting: Pulmonary Disease

## 2016-05-29 ENCOUNTER — Ambulatory Visit: Payer: Medicare Other | Admitting: Rheumatology

## 2016-06-06 ENCOUNTER — Encounter: Payer: Self-pay | Admitting: Rheumatology

## 2016-06-06 ENCOUNTER — Other Ambulatory Visit: Payer: Self-pay | Admitting: Radiology

## 2016-06-06 ENCOUNTER — Ambulatory Visit (INDEPENDENT_AMBULATORY_CARE_PROVIDER_SITE_OTHER): Payer: Medicare Other | Admitting: Rheumatology

## 2016-06-06 VITALS — BP 128/76 | HR 76 | Resp 14 | Ht 65.5 in | Wt 136.0 lb

## 2016-06-06 DIAGNOSIS — M0579 Rheumatoid arthritis with rheumatoid factor of multiple sites without organ or systems involvement: Secondary | ICD-10-CM | POA: Diagnosis not present

## 2016-06-06 DIAGNOSIS — Z79899 Other long term (current) drug therapy: Secondary | ICD-10-CM

## 2016-06-06 DIAGNOSIS — M79642 Pain in left hand: Secondary | ICD-10-CM | POA: Diagnosis not present

## 2016-06-06 DIAGNOSIS — H4052X0 Glaucoma secondary to other eye disorders, left eye, stage unspecified: Secondary | ICD-10-CM | POA: Diagnosis not present

## 2016-06-06 LAB — CBC WITH DIFFERENTIAL/PLATELET
Basophils Absolute: 90 cells/uL (ref 0–200)
Basophils Relative: 1 %
Eosinophils Absolute: 90 cells/uL (ref 15–500)
Eosinophils Relative: 1 %
HCT: 36.8 % (ref 35.0–45.0)
Hemoglobin: 12.1 g/dL (ref 11.7–15.5)
Lymphocytes Relative: 28 %
Lymphs Abs: 2520 cells/uL (ref 850–3900)
MCH: 30.6 pg (ref 27.0–33.0)
MCHC: 32.9 g/dL (ref 32.0–36.0)
MCV: 93.2 fL (ref 80.0–100.0)
MPV: 9.1 fL (ref 7.5–12.5)
Monocytes Absolute: 630 cells/uL (ref 200–950)
Monocytes Relative: 7 %
Neutro Abs: 5670 cells/uL (ref 1500–7800)
Neutrophils Relative %: 63 %
Platelets: 471 10*3/uL — ABNORMAL HIGH (ref 140–400)
RBC: 3.95 MIL/uL (ref 3.80–5.10)
RDW: 14.5 % (ref 11.0–15.0)
WBC: 9 10*3/uL (ref 3.8–10.8)

## 2016-06-06 LAB — COMPLETE METABOLIC PANEL WITH GFR
ALT: 12 U/L (ref 6–29)
AST: 16 U/L (ref 10–35)
Albumin: 4.1 g/dL (ref 3.6–5.1)
Alkaline Phosphatase: 66 U/L (ref 33–130)
BUN: 13 mg/dL (ref 7–25)
CO2: 19 mmol/L — ABNORMAL LOW (ref 20–31)
Calcium: 9.4 mg/dL (ref 8.6–10.4)
Chloride: 95 mmol/L — ABNORMAL LOW (ref 98–110)
Creat: 0.68 mg/dL (ref 0.60–0.93)
GFR, Est African American: >89 mL/min (ref >=60)
GFR, Est Non African American: 86 mL/min (ref >=60)
Glucose, Bld: 89 mg/dL (ref 65–99)
Potassium: 4.5 mmol/L (ref 3.5–5.3)
Sodium: 127 mmol/L — ABNORMAL LOW (ref 135–146)
Total Bilirubin: 0.5 mg/dL (ref 0.2–1.2)
Total Protein: 6.6 g/dL (ref 6.1–8.1)

## 2016-06-06 NOTE — Progress Notes (Signed)
Office Visit Note  Patient: Felicia [name]             Date of Birth: 06/20/1940           MRN: [id number]             PCP: Thressa Sheller, MD Referring: Thressa Sheller, MD Visit Date: 06/06/2016 Occupation: @GUAROCC @    Subjective:  Pain of the Right Hand; Pain of the Left Hand; and Follow-up (stiffness with cold)   History of Present Illness: Felicia [name] is a 76 y.o. female  Last seen 12/25/2015. Patient is doing well with her rheumatoid arthritis overall. Orencia every Tuesday, methotrexate 0.8 ML's every Sunday, folic acid 2 mg every day are effective for the patient's rheumatoid arthritis needs.  Her main problem is her left CMC joint is painful at times. She also notices that the left [facility] is slightly more enlarged than the right CMC.  She also has a history of glaucoma to the left eye she did not get any better with conservative pharmaceutical treatment. As a result, there was no option left but to do surgery and patient had surgery done recently at [facility]. She is doing better with that at this time.  Patient does need her Orencia and methotrexate refill.  She also will get labs done today.   Activities of Daily Living:  Patient reports morning stiffness for 15 minutes.   Patient Denies nocturnal pain.  Difficulty dressing/grooming: Denies Difficulty climbing stairs: Denies Difficulty getting out of chair: Denies Difficulty using hands for taps, buttons, cutlery, and/or writing: Denies   Review of Systems  Constitutional: Negative for fatigue.  HENT: Negative for mouth sores and mouth dryness.   Eyes: Negative for dryness.  Respiratory: Negative for shortness of breath.   Gastrointestinal: Negative for constipation and diarrhea.  Musculoskeletal: Negative for myalgias and myalgias.  Skin: Negative for sensitivity to sunlight.  Psychiatric/Behavioral: Negative for decreased concentration and sleep disturbance.    PMFS History:  Patient Active  Problem List   Diagnosis Date Noted  . Exertional chest pain   . Pain in the chest 03/27/2015  . Postural hypotension 05/26/2013  . OSA (obstructive sleep apnea) 03/22/2013  . Osteoarthritis 11/07/2011  . Abdominal bruit 10/16/2011  . HTN (hypertension) 08/13/2011  . Anisocoria 08/13/2011  . Cervical radiculopathy 07/17/2011  . DDD (degenerative disc disease), cervical 07/10/2011  . Glaucoma 06/06/2011  . HIATAL HERNIA 01/30/2010  . POSTMENOPAUSAL SYNDROME 12/14/2009  . EUSTACHIAN TUBE DYSFUNCTION, RIGHT 06/28/2009  . GERD 12/07/2008  . Rheumatoid arthritis ([place]) 12/07/2008  . COLONIC POLYPS, HX OF 12/07/2008  . HYPERLIPIDEMIA 10/17/2006  . HYPERCALCEMIA 10/17/2006  . MITRAL VALVE PROLAPSE 10/17/2006    Past Medical History:  Diagnosis Date  . Adenomatous colon polyp 2004   Colo in 2009 "no polyps"  . Complication of anesthesia    small airway per patient  . GERD (gastroesophageal reflux disease)   . Glaucoma   . Hiatal hernia   . Hypertension   . Rheumatoid arthritis(714.0)    Orencia; Methotrexate (Dr. Patrecia Pour)  . Sleep apnea    on CPAP    Family History  Problem Relation Age of Onset  . Dementia Mother   . Lung disease Mother     bronchiectasis  . Heart disease Mother     Aortic Stenosis  . Osteoporosis Mother   . Heart attack Father 75    S/P CBAG  . Stroke Maternal Grandmother 83  . Diabetes Neg Hx   . Cancer  Neg Hx    Past Surgical History:  Procedure Laterality Date  . CARDIAC CATHETERIZATION N/A 03/29/2015   Procedure: Left Heart Cath and Coronary Angiography;  Surgeon: Belva Crome, MD;  Location: [facility];  Service: Cardiovascular;  Laterality: N/A;  . CATARACT EXTRACTION, BILATERAL    . COLONOSCOPY W/ POLYPECTOMY     X1; negative subsequently  . DILATION AND CURETTAGE OF UTERUS    . EYE SURGERY     due to RA  . HYSTEROSCOPY W/D&C  06/24/2012   Procedure: DILATATION AND CURETTAGE /HYSTEROSCOPY;  Surgeon: [name], MD;   Location: [facility];  Service: Gynecology;  Laterality: N/A;  . SHOULDER SURGERY Left    due to RA  . TRABECULECTOMY     X 2 OD; X 1 OS  . UPPER GASTROINTESTINAL ENDOSCOPY     hiatal hernia   Social History   Social History Narrative  . No narrative on file     Objective: Vital Signs: BP 128/76   Pulse 76   Resp 14   Ht 5' 5.5" (1.664 m)   Wt 136 lb (61.7 kg)   BMI 22.29 kg/m    Physical Exam  Constitutional: She is oriented to person, place, and time. She appears well-developed and well-nourished.  HENT:  Head: Normocephalic and atraumatic.  Eyes: EOM are normal. Pupils are equal, round, and reactive to light.  Cardiovascular: Normal rate, regular rhythm and normal heart sounds.  Exam reveals no gallop and no friction rub.   No murmur heard. Pulmonary/Chest: Effort normal and breath sounds normal. She has no wheezes. She has no rales.  Abdominal: Soft. Bowel sounds are normal. She exhibits no distension. There is no tenderness. There is no guarding. No hernia.  Musculoskeletal: Normal range of motion. She exhibits no edema, tenderness or deformity.  Lymphadenopathy:    She has no cervical adenopathy.  Neurological: She is alert and oriented to person, place, and time. Coordination normal.  Skin: Skin is warm and dry. Capillary refill takes less than 2 seconds. No rash noted.  Psychiatric: She has a normal mood and affect. Her behavior is normal.  Nursing note and vitals reviewed.    Musculoskeletal Exam:  Full range of motion of all joints Grip strength is equal and strong bilaterally Fiber myalgia tender points are all absent  CDAI Exam: CDAI Homunculus Exam:   Tenderness:  Left hand: 1st MCP  Joint Counts:  CDAI Tender Joint count: 1 CDAI Swollen Joint count: 0  Global Assessments:  Patient Global Assessment: 5 Provider Global Assessment: 5  CDAI Calculated Score: 11  Left CMC joint is tender to palpation but there is no redness or warmth. It is slightly  more prominent than the right. This can be anatomical variable versus enlargement of the joint secondary to her RA versus OA.  Investigation: No additional findings.   Imaging: No results found.  Speciality Comments: No specialty comments available.    Procedures:  No procedures performed Allergies: Sulfonamide derivatives; Zostavax [zoster vaccine live]; and Norvasc [amlodipine besylate]   Assessment / Plan:     Visit Diagnoses: No diagnosis found.   Plan: #1: Patient has rheumatoid arthritis and she is doing very well. #2: Using Orencia every week on Tuesdays. #3: Using methotrexate 0.8 ML's every Sunday. #4: Using folic acid 2 pills every day #5: Has a history of OA of the knee joint. Has had Hyalgan injections 5 to both knees which were completed in 03/20/2016. Patient states that she is  doing really well with the knee injections. She can restart these injections after approval as early as 09/20/2016. If patient is doing exceptionally well we can delay that injection until she started to feel some discomfort but I've advised the patient to go ahead and get the injections started again at least by July August 2018 even if she is tolerating her discomfort well  #6: Her left CMC is hurting slightly. I advised her to do hand exercises, offered her Voltaren gel which she will use, offered her right CMC brace to use sparingly and patient is agreeable.  #7: Refill Orencia, methotrexate, folic acid.  #8: Patient has had glaucoma of the left eye which did not respond well to medications. She ended up having to go forward with the surgery at [facility]. That surgery has been successful and her pressure in her left eye is now at 10. Patient states that her doctor is happy with her progress and patient is equally happy with her progress.  #9: CBC with differential and CMP with GFR every 3 months starting today. Order has already been placed #10: Return to clinic in 5  months  #11: Consider referring to Hand specialist if left CMC pain continues. Dr. Estanislado Pandy may want to do a left CMC injection in the future if pain continues  Orders: No orders of the defined types were placed in this encounter.  No orders of the defined types were placed in this encounter.   Face-to-face time spent with patient was 30 minutes. 50% of time was spent in counseling and coordination of care.  Follow-Up Instructions: No Follow-up on file.   Felicia Shannon, PA-C I examined the patient she had no synovitis. Will continue current treatment for right now. I examined and evaluated the patient with Felicia Lofts PA. The plan of care was discussed as noted above.  Bo Merino, MD Note - This record has been created using Editor, commissioning.  Chart creation errors have been sought, but may not always  have been located. Such creation errors do not reflect on  the standard of medical care.

## 2016-06-06 NOTE — Patient Instructions (Signed)
Hand Exercises Introduction Hand exercises can be helpful to almost anyone. These exercises can strengthen the hands, improve flexibility and movement, and increase blood flow to the hands. These results can make work and daily tasks easier. Hand exercises can be especially helpful for people who have joint pain from arthritis or have nerve damage from overuse (carpal tunnel syndrome). These exercises can also help people who have injured a hand. Most of these hand exercises are fairly gentle stretching routines. You can do them often throughout the day. Still, it is a good idea to ask your health care provider which exercises would be best for you. Warming your hands before exercise may help to reduce stiffness. You can do this with gentle massage or by placing your hands in warm water for 15 minutes. Also, make sure you pay attention to your level of hand pain as you begin an exercise routine. Exercises Knuckle Bend  Repeat this exercise 5-10 times with each hand. 1. Stand or sit with your arm, hand, and all five fingers pointed straight up. Make sure your wrist is straight. 2. Gently and slowly bend your fingers down and inward until the tips of your fingers are touching the tops of your palm. 3. Hold this position for a few seconds. 4. Extend your fingers out to their original position, all pointing straight up again. Finger Fan  Repeat this exercise 5-10 times with each hand. 1. Hold your arm and hand out in front of you. Keep your wrist straight. 2. Squeeze your hand into a fist. 3. Hold this position for a few seconds. 4. Fan out, or spread apart, your hand and fingers as much as possible, stretching every joint fully. Tabletop  Repeat this exercise 5-10 times with each hand. 1. Stand or sit with your arm, hand, and all five fingers pointed straight up. Make sure your wrist is straight. 2. Gently and slowly bend your fingers at the knuckles where they meet the hand until your hand is  making an upside-down L shape. Your fingers should form a tabletop. 3. Hold this position for a few seconds. 4. Extend your fingers out to their original position, all pointing straight up again. Making Os  Repeat this exercise 5-10 times with each hand. 1. Stand or sit with your arm, hand, and all five fingers pointed straight up. Make sure your wrist is straight. 2. Make an O shape by touching your pointer finger to your thumb. Hold for a few seconds. Then open your hand wide. 3. Repeat this motion with each finger on your hand. Table Spread  Repeat this exercise 5-10 times with each hand. 1. Place your hand on a table with your palm facing down. Make sure your wrist is straight. 2. Spread your fingers out as much as possible. Hold this position for a few seconds. 3. Slide your fingers back together again. Hold for a few seconds. Ball Grip  Repeat this exercise 10-15 times with each hand. 1. Hold a tennis ball or another soft ball in your hand. 2. While slowly increasing pressure, squeeze the ball as hard as possible. 3. Squeeze as hard as you can for 3-5 seconds. 4. Relax and repeat. Wrist Curls  Repeat this exercise 10-15 times with each hand. 1. Sit in a chair that has armrests. 2. Hold a light weight in your hand, such as a dumbbell that weighs 1-3 pounds (0.5-1.4 kg). Ask your health care provider what weight would be best for you. 3. Rest your hand just   over the end of the chair arm with your palm facing up. 4. Gently pivot your wrist up and down while holding the weight. Do not twist your wrist from side to side. Contact a health care provider if:  Your hand pain or discomfort gets much worse when you do an exercise.  Your hand pain or discomfort does not improve within 2 hours after you exercise. If you have any of these problems, stop doing these exercises right away. Do not do them again unless your health care provider says that you can. Get help right away if:  You  develop sudden, severe hand pain. If this happens, stop doing these exercises right away. Do not do them again unless your health care provider says that you can. This information is not intended to replace advice given to you by your health care provider. Make sure you discuss any questions you have with your health care provider. Document Released: 05/01/2015 Document Revised: 10/26/2015 Document Reviewed: 11/28/2014  2017 Elsevier  

## 2016-06-07 NOTE — Progress Notes (Signed)
Labs are stable, sodium low. Fax to PCP.

## 2016-06-10 ENCOUNTER — Other Ambulatory Visit: Payer: Self-pay | Admitting: *Deleted

## 2016-06-10 MED ORDER — ABATACEPT 125 MG/ML ~~LOC~~ SOSY: 125.0000 mg | PREFILLED_SYRINGE | SUBCUTANEOUS | 0 refills | Status: DC

## 2016-06-10 NOTE — Telephone Encounter (Signed)
Refill request received via fax for Orencia  Last Visit: 06/06/16 Next Visit: 11/07/16 Labs: 06/06/16 Stable TB Gold: 10/09/15 Neg  Okay to refill Orencia?

## 2016-06-25 ENCOUNTER — Other Ambulatory Visit: Payer: Self-pay | Admitting: Rheumatology

## 2016-06-25 NOTE — Telephone Encounter (Signed)
Last Visit: 06/06/16 Next Visit: 11/07/16 Labs: 06/06/16 Stable  Okay to refill MTX?

## 2016-09-03 ENCOUNTER — Telehealth: Payer: Self-pay | Admitting: Pulmonary Disease

## 2016-09-03 NOTE — Telephone Encounter (Signed)
Spoke with the pt  She states in Jan 2018 she decided to stop CPAP  She states prior to this she had been waking up in the night and had taken off her mask unknowingly She feels she is doing fine without it  She has lost approx 4 lbs, doing wt watchers  She naps every day around 1-2 pm, and attributes this to her arthritis meds making her sleepy  Please advise thanks

## 2016-09-04 ENCOUNTER — Other Ambulatory Visit: Payer: Self-pay | Admitting: *Deleted

## 2016-09-04 ENCOUNTER — Other Ambulatory Visit: Payer: Self-pay | Admitting: Rheumatology

## 2016-09-04 DIAGNOSIS — Z79899 Other long term (current) drug therapy: Secondary | ICD-10-CM

## 2016-09-04 LAB — COMPLETE METABOLIC PANEL WITH GFR
ALT: 14 U/L (ref 6–29)
AST: 18 U/L (ref 10–35)
Albumin: 3.7 g/dL (ref 3.6–5.1)
Alkaline Phosphatase: 61 U/L (ref 33–130)
BUN: 16 mg/dL (ref 7–25)
CO2: 23 mmol/L (ref 20–31)
Calcium: 9.4 mg/dL (ref 8.6–10.4)
Chloride: 97 mmol/L — ABNORMAL LOW (ref 98–110)
Creat: 0.8 mg/dL (ref 0.60–0.93)
GFR, Est African American: 84 mL/min (ref >=60)
GFR, Est Non African American: 73 mL/min (ref >=60)
Glucose, Bld: 109 mg/dL — ABNORMAL HIGH (ref 65–99)
Potassium: 4.9 mmol/L (ref 3.5–5.3)
Sodium: 128 mmol/L — ABNORMAL LOW (ref 135–146)
Total Bilirubin: 0.5 mg/dL (ref 0.2–1.2)
Total Protein: 6 g/dL — ABNORMAL LOW (ref 6.1–8.1)

## 2016-09-04 LAB — CBC WITH DIFFERENTIAL/PLATELET
Basophils Absolute: 78 cells/uL (ref 0–200)
Basophils Relative: 1 %
Eosinophils Absolute: 78 cells/uL (ref 15–500)
Eosinophils Relative: 1 %
HCT: 35.6 % (ref 35.0–45.0)
Hemoglobin: 11.6 g/dL — ABNORMAL LOW (ref 11.7–15.5)
Lymphocytes Relative: 25 %
Lymphs Abs: 1950 cells/uL (ref 850–3900)
MCH: 30.8 pg (ref 27.0–33.0)
MCHC: 32.6 g/dL (ref 32.0–36.0)
MCV: 94.4 fL (ref 80.0–100.0)
MPV: 9 fL (ref 7.5–12.5)
Monocytes Absolute: 468 cells/uL (ref 200–950)
Monocytes Relative: 6 %
Neutro Abs: 5226 cells/uL (ref 1500–7800)
Neutrophils Relative %: 67 %
Platelets: 404 10*3/uL — ABNORMAL HIGH (ref 140–400)
RBC: 3.77 MIL/uL — ABNORMAL LOW (ref 3.80–5.10)
RDW: 14.7 % (ref 11.0–15.0)
WBC: 7.8 10*3/uL (ref 3.8–10.8)

## 2016-09-04 NOTE — Telephone Encounter (Signed)
Last Visit: 06/06/16 Next Visit: 11/07/16 Labs: 06/06/16 Stable Updated labs today  Okay to refill MTX?

## 2016-09-04 NOTE — Telephone Encounter (Signed)
ok 

## 2016-09-05 NOTE — Progress Notes (Signed)
Sodium is low. Notify patient and faxed to her PCP. Rest of the labs are stable

## 2016-09-17 ENCOUNTER — Other Ambulatory Visit: Payer: Self-pay | Admitting: *Deleted

## 2016-09-17 MED ORDER — ABATACEPT 125 MG/ML ~~LOC~~ SOSY: 125.0000 mg | PREFILLED_SYRINGE | SUBCUTANEOUS | 0 refills | Status: DC

## 2016-09-17 NOTE — Telephone Encounter (Signed)
Refill request received via fax  Last Visit: 06/06/16 Next Visit: 11/07/16 Labs: 09/04/16 Sodium is low 128 TB Gold: 10/09/15  Okay to refill Orencia?

## 2016-09-17 NOTE — Telephone Encounter (Signed)
ok 

## 2016-09-19 ENCOUNTER — Telehealth: Payer: Self-pay | Admitting: Pulmonary Disease

## 2016-09-19 DIAGNOSIS — G4733 Obstructive sleep apnea (adult) (pediatric): Secondary | ICD-10-CM

## 2016-09-19 NOTE — Telephone Encounter (Signed)
Per the 4.3.18 phone note, pt decided to stop using her CPAP and proceed with alternative therapies such as losing weight (she joined weight watchers), taking a nap during the day, etc.  The message was routed to RA for his recommendations and/or authorization for this change.  Called spoke with patient who stated she was just checking with the office to make sure that RA was okay with her stopping her CPAP.  Per that phone note, RA responded "ok."  Informed pt of his response.  Pt asked about the CPAP machine and if she needs to keep it vs returning it to the DME company and questioned if her insurance would be notified.  Advised pt that order will be sent to DME to d/c CPAP.  Pt also asked about COPD diagnosis (she sees her PCP on 4.23.18 for this diagnosis but our office has no documentation of this dx and pt has never had a PFT per her report).  Did advise pt that RA can also see her to evaluate for COPD if she'll just call the office for appt should she decide to see [place] for pulmonary.  Pt voiced her understanding.  Order to [employer] to d/c CPAP Nothing further needed; will sign off.

## 2016-11-07 ENCOUNTER — Encounter: Payer: Self-pay | Admitting: Rheumatology

## 2016-11-07 ENCOUNTER — Ambulatory Visit (INDEPENDENT_AMBULATORY_CARE_PROVIDER_SITE_OTHER): Payer: Medicare Other | Admitting: Rheumatology

## 2016-11-07 VITALS — BP 137/66 | HR 55 | Resp 14 | Ht 65.5 in | Wt 133.0 lb

## 2016-11-07 DIAGNOSIS — M170 Bilateral primary osteoarthritis of knee: Secondary | ICD-10-CM

## 2016-11-07 DIAGNOSIS — M0579 Rheumatoid arthritis with rheumatoid factor of multiple sites without organ or systems involvement: Secondary | ICD-10-CM

## 2016-11-07 DIAGNOSIS — H4052X0 Glaucoma secondary to other eye disorders, left eye, stage unspecified: Secondary | ICD-10-CM

## 2016-11-07 DIAGNOSIS — Z79899 Other long term (current) drug therapy: Secondary | ICD-10-CM

## 2016-11-07 NOTE — Progress Notes (Signed)
Office Visit Note  Patient: Felicia [name]             Date of Birth: [birth year]           MRN: [id number]             PCP: Thressa Sheller, MD Referring: Thressa Sheller, MD Visit Date: 11/07/2016 Occupation: '@GUAROCC' @    Subjective:  No chief complaint on file.   History of Present Illness: Felicia [name] is a 76 y.o. female  Last seen 06/06/2016 Patient is doing well with her rheumatoid arthritis overall. Orencia every Tuesday,  methotrexate 0.8 ML's every Sunday, folic acid 2 mg every day are effective for the patient's rheumatoid arthritis needs.   Activities of Daily Living:  Patient reports morning stiffness for 30 minutes.   Patient Denies nocturnal pain.  Difficulty dressing/grooming: Denies Difficulty climbing stairs: Denies Difficulty getting out of chair: Denies Difficulty using hands for taps, buttons, cutlery, and/or writing: Denies   Review of Systems  Constitutional: Negative for fatigue.  HENT: Negative for mouth sores and mouth dryness.   Eyes: Negative for dryness.  Respiratory: Negative for shortness of breath.   Gastrointestinal: Negative for constipation and diarrhea.  Musculoskeletal: Negative for myalgias and myalgias.  Skin: Negative for sensitivity to sunlight.  Psychiatric/Behavioral: Negative for decreased concentration and sleep disturbance.    PMFS History:  Patient Active Problem List   Diagnosis Date Noted  . Exertional chest pain   . Pain in the chest 03/27/2015  . Postural hypotension 05/26/2013  . OSA (obstructive sleep apnea) 03/22/2013  . Osteoarthritis 11/07/2011  . Abdominal bruit 10/16/2011  . HTN (hypertension) 08/13/2011  . Anisocoria 08/13/2011  . Cervical radiculopathy 07/17/2011  . DDD (degenerative disc disease), cervical 07/10/2011  . Glaucoma 06/06/2011  . HIATAL HERNIA 01/30/2010  . POSTMENOPAUSAL SYNDROME 12/14/2009  . EUSTACHIAN TUBE DYSFUNCTION, RIGHT 06/28/2009  . GERD 12/07/2008  . Rheumatoid arthritis  ([place]) 12/07/2008  . COLONIC POLYPS, HX OF 12/07/2008  . HYPERLIPIDEMIA 10/17/2006  . HYPERCALCEMIA 10/17/2006  . MITRAL VALVE PROLAPSE 10/17/2006    Past Medical History:  Diagnosis Date  . Adenomatous colon polyp 2004   Colo in 2009 "no polyps"  . Complication of anesthesia    small airway per patient  . GERD (gastroesophageal reflux disease)   . Glaucoma   . Hiatal hernia   . Hypertension   . Rheumatoid arthritis(714.0)    Orencia; Methotrexate (Dr. Patrecia Pour)  . Sleep apnea    on CPAP    Family History  Problem Relation Age of Onset  . Dementia Mother   . Lung disease Mother        bronchiectasis  . Heart disease Mother        Aortic Stenosis  . Osteoporosis Mother   . Heart attack Father 98       S/P CBAG  . Stroke Maternal Grandmother 83  . Diabetes Neg Hx   . Cancer Neg Hx    Past Surgical History:  Procedure Laterality Date  . CARDIAC CATHETERIZATION N/A 03/29/2015   Procedure: Left Heart Cath and Coronary Angiography;  Surgeon: Belva Crome, MD;  Location: [facility];  Service: Cardiovascular;  Laterality: N/A;  . CATARACT EXTRACTION, BILATERAL    . COLONOSCOPY W/ POLYPECTOMY     X1; negative subsequently  . DILATION AND CURETTAGE OF UTERUS    . EYE SURGERY     due to RA  . HYSTEROSCOPY W/D&C  06/24/2012   Procedure: DILATATION AND CURETTAGE /  HYSTEROSCOPY;  Surgeon: [name], MD;  Location: [facility];  Service: Gynecology;  Laterality: N/A;  . SHOULDER SURGERY Left    due to RA  . TRABECULECTOMY     X 2 OD; X 1 OS  . UPPER GASTROINTESTINAL ENDOSCOPY     hiatal hernia   Social History   Social History Narrative  . No narrative on file     Objective: Vital Signs: BP 137/66   Pulse (!) 55   Resp 14   Ht 5' 5.5" (1.664 m)   Wt 133 lb (60.3 kg)   BMI 21.80 kg/m    Physical Exam  Constitutional: She is oriented to person, place, and time. She appears well-developed and well-nourished.  HENT:  Head: Normocephalic and atraumatic.   Eyes: EOM are normal. Pupils are equal, round, and reactive to light.  Cardiovascular: Normal rate, regular rhythm and normal heart sounds.  Exam reveals no gallop and no friction rub.   No murmur heard. Pulmonary/Chest: Effort normal and breath sounds normal. She has no wheezes. She has no rales.  Abdominal: Soft. Bowel sounds are normal. She exhibits no distension. There is no tenderness. There is no guarding. No hernia.  Musculoskeletal: Normal range of motion. She exhibits no edema, tenderness or deformity.  Lymphadenopathy:    She has no cervical adenopathy.  Neurological: She is alert and oriented to person, place, and time. Coordination normal.  Skin: Skin is warm and dry. Capillary refill takes less than 2 seconds. No rash noted.  Psychiatric: She has a normal mood and affect. Her behavior is normal.  Nursing note and vitals reviewed.    Musculoskeletal Exam:  Full range of motion of all joints Grip strength is equal and strong bilaterally Fiber myalgia tender points are all absent  CDAI Exam: CDAI Homunculus Exam:   Joint Counts:  CDAI Tender Joint count: 0 CDAI Swollen Joint count: 0  Global Assessments:  Patient Global Assessment: 7 Provider Global Assessment: 7  CDAI Calculated Score: 14    Investigation: No additional findings.  Orders Only on 09/04/2016  Component Date Value Ref Range Status  . WBC 09/04/2016 7.8  3.8 - 10.8 K/uL Final  . RBC 09/04/2016 3.77* 3.80 - 5.10 MIL/uL Final  . Hemoglobin 09/04/2016 11.6* 11.7 - 15.5 g/dL Final  . HCT 09/04/2016 35.6  35.0 - 45.0 % Final  . MCV 09/04/2016 94.4  80.0 - 100.0 fL Final  . MCH 09/04/2016 30.8  27.0 - 33.0 pg Final  . MCHC 09/04/2016 32.6  32.0 - 36.0 g/dL Final  . RDW 09/04/2016 14.7  11.0 - 15.0 % Final  . Platelets 09/04/2016 404* 140 - 400 K/uL Final  . MPV 09/04/2016 9.0  7.5 - 12.5 fL Final  . Neutro Abs 09/04/2016 5226  1,500 - 7,800 cells/uL Final  . Lymphs Abs 09/04/2016 1950  850 - 3,900  cells/uL Final  . Monocytes Absolute 09/04/2016 468  200 - 950 cells/uL Final  . Eosinophils Absolute 09/04/2016 78  15 - 500 cells/uL Final  . Basophils Absolute 09/04/2016 78  0 - 200 cells/uL Final  . Neutrophils Relative % 09/04/2016 67  % Final  . Lymphocytes Relative 09/04/2016 25  % Final  . Monocytes Relative 09/04/2016 6  % Final  . Eosinophils Relative 09/04/2016 1  % Final  . Basophils Relative 09/04/2016 1  % Final  . Smear Review 09/04/2016 Criteria for review not met   Final  . Sodium 09/04/2016 128* 135 - 146 mmol/L Final  .  Potassium 09/04/2016 4.9  3.5 - 5.3 mmol/L Final  . Chloride 09/04/2016 97* 98 - 110 mmol/L Final  . CO2 09/04/2016 23  20 - 31 mmol/L Final  . Glucose, Bld 09/04/2016 109* 65 - 99 mg/dL Final  . BUN 09/04/2016 16  7 - 25 mg/dL Final  . Creat 09/04/2016 0.80  0.60 - 0.93 mg/dL Final   Comment:   For patients > or = 75 years of age: The upper reference limit for Creatinine is approximately 13% higher for people identified as African-American.     . Total Bilirubin 09/04/2016 0.5  0.2 - 1.2 mg/dL Final  . Alkaline Phosphatase 09/04/2016 61  33 - 130 U/L Final  . AST 09/04/2016 18  10 - 35 U/L Final  . ALT 09/04/2016 14  6 - 29 U/L Final  . Total Protein 09/04/2016 6.0* 6.1 - 8.1 g/dL Final  . Albumin 09/04/2016 3.7  3.6 - 5.1 g/dL Final  . Calcium 09/04/2016 9.4  8.6 - 10.4 mg/dL Final  . GFR, Est African American 09/04/2016 84  >=60 mL/min Final  . GFR, Est Non African American 09/04/2016 73  >=60 mL/min Final  Orders Only on 06/06/2016  Component Date Value Ref Range Status  . WBC 06/06/2016 9.0  3.8 - 10.8 K/uL Final  . RBC 06/06/2016 3.95  3.80 - 5.10 MIL/uL Final  . Hemoglobin 06/06/2016 12.1  11.7 - 15.5 g/dL Final  . HCT 06/06/2016 36.8  35.0 - 45.0 % Final  . MCV 06/06/2016 93.2  80.0 - 100.0 fL Final  . MCH 06/06/2016 30.6  27.0 - 33.0 pg Final  . MCHC 06/06/2016 32.9  32.0 - 36.0 g/dL Final  . RDW 06/06/2016 14.5  11.0 - 15.0 %  Final  . Platelets 06/06/2016 471* 140 - 400 K/uL Final  . MPV 06/06/2016 9.1  7.5 - 12.5 fL Final  . Neutro Abs 06/06/2016 5670  1,500 - 7,800 cells/uL Final  . Lymphs Abs 06/06/2016 2520  850 - 3,900 cells/uL Final  . Monocytes Absolute 06/06/2016 630  200 - 950 cells/uL Final  . Eosinophils Absolute 06/06/2016 90  15 - 500 cells/uL Final  . Basophils Absolute 06/06/2016 90  0 - 200 cells/uL Final  . Neutrophils Relative % 06/06/2016 63  % Final  . Lymphocytes Relative 06/06/2016 28  % Final  . Monocytes Relative 06/06/2016 7  % Final  . Eosinophils Relative 06/06/2016 1  % Final  . Basophils Relative 06/06/2016 1  % Final  . Smear Review 06/06/2016 Criteria for review not met   Final  . Sodium 06/06/2016 127* 135 - 146 mmol/L Final  . Potassium 06/06/2016 4.5  3.5 - 5.3 mmol/L Final  . Chloride 06/06/2016 95* 98 - 110 mmol/L Final  . CO2 06/06/2016 19* 20 - 31 mmol/L Final  . Glucose, Bld 06/06/2016 89  65 - 99 mg/dL Final  . BUN 06/06/2016 13  7 - 25 mg/dL Final  . Creat 06/06/2016 0.68  0.60 - 0.93 mg/dL Final   Comment:   For patients > or = 75 years of age: The upper reference limit for Creatinine is approximately 13% higher for people identified as African-American.     . Total Bilirubin 06/06/2016 0.5  0.2 - 1.2 mg/dL Final  . Alkaline Phosphatase 06/06/2016 66  33 - 130 U/L Final  . AST 06/06/2016 16  10 - 35 U/L Final  . ALT 06/06/2016 12  6 - 29 U/L Final  . Total Protein 06/06/2016 6.6  6.1 - 8.1 g/dL Final  . Albumin 06/06/2016 4.1  3.6 - 5.1 g/dL Final  . Calcium 06/06/2016 9.4  8.6 - 10.4 mg/dL Final  . GFR, Est African American 06/06/2016 >89  >=60 mL/min Final  . GFR, Est Non African American 06/06/2016 86  >=60 mL/min Final     Imaging: No results found.  Speciality Comments: No specialty comments available.    Procedures:  No procedures performed Allergies: Sulfonamide derivatives; Zostavax [zoster vaccine live]; and Norvasc [amlodipine besylate]    Assessment / Plan:     Visit Diagnoses: Rheumatoid arthritis with rheumatoid factor of multiple sites without organ or systems involvement ([place])  High risk medications (not anticoagulants) long-term use - Plan: CBC with Differential/Platelet, COMPLETE METABOLIC PANEL WITH GFR, Quantiferon tb gold assay (blood)  Glaucoma of left eye associated with ocular disorder, unspecified glaucoma stage  Primary osteoarthritis of both knees    Plan: #1: Patient has rheumatoid arthritis and she is doing very well. #2: Using Orencia every week on Tuesdays. #3: Using methotrexate 0.8 ML's every Sunday. #4: Using folic acid 2 pills every day #5: OA of bilateral knee joint. Patient recalls getting Visco supplementation about a year ago. She continues to do well and does not require repeat Visco at this time. She knows to call us to apply for Visco when she starts having issues again. #6: Return to clinic in 5 months #7: No med refills needed at this time according to patient #8: CBC with differential, CMP with GFR, TB gold due today. #9: CBC with differential CMP with GFR due again in 3 months (September 2018)  Orders: Orders Placed This Encounter  Procedures  . CBC with Differential/Platelet  . COMPLETE METABOLIC PANEL WITH GFR  . Quantiferon tb gold assay (blood)   No orders of the defined types were placed in this encounter.   Face-to-face time spent with patient was 30 minutes. 50% of time was spent in counseling and coordination of care.  Follow-Up Instructions: Return in about 5 months (around 04/09/2017).   Eliezer Lofts, PA-C   I examined and evaluated the patient with Eliezer Lofts PA. The plan of care was discussed as noted above.  Bo Merino, MD Note - This record has been created using Editor, commissioning.  Chart creation errors have been sought, but may not always  have been located. Such creation errors do not reflect on  the standard of medical care.

## 2016-11-08 LAB — COMPLETE METABOLIC PANEL WITH GFR
ALT: 15 U/L (ref 6–29)
AST: 16 U/L (ref 10–35)
Albumin: 4.1 g/dL (ref 3.6–5.1)
Alkaline Phosphatase: 71 U/L (ref 33–130)
BUN: 14 mg/dL (ref 7–25)
CO2: 23 mmol/L (ref 20–31)
Calcium: 10.2 mg/dL (ref 8.6–10.4)
Chloride: 97 mmol/L — ABNORMAL LOW (ref 98–110)
Creat: 0.83 mg/dL (ref 0.60–0.93)
GFR, Est African American: 80 mL/min (ref >=60)
GFR, Est Non African American: 69 mL/min (ref >=60)
Glucose, Bld: 97 mg/dL (ref 65–99)
Potassium: 5.6 mmol/L — ABNORMAL HIGH (ref 3.5–5.3)
Sodium: 130 mmol/L — ABNORMAL LOW (ref 135–146)
Total Bilirubin: 0.7 mg/dL (ref 0.2–1.2)
Total Protein: 7 g/dL (ref 6.1–8.1)

## 2016-11-08 LAB — CBC WITH DIFFERENTIAL/PLATELET
Basophils Absolute: 96 {cells}/uL (ref 0–200)
Basophils Relative: 1 %
Eosinophils Absolute: 96 {cells}/uL (ref 15–500)
Eosinophils Relative: 1 %
HCT: 40.4 % (ref 35.0–45.0)
Hemoglobin: 13.2 g/dL (ref 11.7–15.5)
Lymphocytes Relative: 22 %
Lymphs Abs: 2112 {cells}/uL (ref 850–3900)
MCH: 31.4 pg (ref 27.0–33.0)
MCHC: 32.7 g/dL (ref 32.0–36.0)
MCV: 96.2 fL (ref 80.0–100.0)
MPV: 9.2 fL (ref 7.5–12.5)
Monocytes Absolute: 576 {cells}/uL (ref 200–950)
Monocytes Relative: 6 %
Neutro Abs: 6720 {cells}/uL (ref 1500–7800)
Neutrophils Relative %: 70 %
Platelets: 509 10*3/uL — ABNORMAL HIGH (ref 140–400)
RBC: 4.2 MIL/uL (ref 3.80–5.10)
RDW: 14.5 % (ref 11.0–15.0)
WBC: 9.6 10*3/uL (ref 3.8–10.8)

## 2016-11-09 LAB — QUANTIFERON TB GOLD ASSAY (BLOOD)
Interferon Gamma Release Assay: NEGATIVE
Mitogen-Nil: 2.07 IU/mL
Quantiferon Nil Value: 0.04 IU/mL
Quantiferon Tb Ag Minus Nil Value: <0 IU/mL

## 2016-11-12 ENCOUNTER — Other Ambulatory Visit: Payer: Self-pay | Admitting: Rheumatology

## 2016-11-12 NOTE — Telephone Encounter (Signed)
Last Visit: 11/07/16 Next Visit: 04/09/17 Labs: 11/07/16 cbc wnl CMP with GFR shows low sodium at 130; elevated potassium; low chloride (history of same in past.)  Okay to refill MTX?

## 2016-12-03 ENCOUNTER — Other Ambulatory Visit: Payer: Self-pay | Admitting: Rheumatology

## 2016-12-05 NOTE — Telephone Encounter (Signed)
Last Visit: 11/07/16 Next Visit: 04/09/17 Labs: 11/07/16 cbc wnl CMP with GFR shows low sodium at 130; elevated potassium 5.6 (Patient and PCP advised) TB Gold:11/07/16 Neg  Okay to refill Orencia?

## 2016-12-05 NOTE — Telephone Encounter (Signed)
ok 

## 2016-12-15 ENCOUNTER — Other Ambulatory Visit: Payer: Self-pay | Admitting: Rheumatology

## 2016-12-16 NOTE — Telephone Encounter (Signed)
Last Visit: 11/07/16 Next Visit: 04/09/17  Okay to refill per Dr. Deveshwar 

## 2017-01-02 ENCOUNTER — Telehealth: Payer: Self-pay | Admitting: Rheumatology

## 2017-01-02 NOTE — Telephone Encounter (Signed)
Patient calling now that knees are aching, and hurting mow. Would like to know if she can get Hyalgan injections again. Please call to advise.

## 2017-01-02 NOTE — Telephone Encounter (Signed)
Mr Felicia [name] has advised in his note, Patient recalls getting Visco supplementation about a year ago. She continues to do well and does not require repeat Visco at this time. She knows to call us to apply for Visco when she starts having issues again.  Called patient to advise we will apply, but this process takes time. Left message for her to call back, so we can advise.

## 2017-01-02 NOTE — Telephone Encounter (Signed)
Patient returning your call.

## 2017-01-03 ENCOUNTER — Telehealth: Payer: Self-pay | Admitting: Rheumatology

## 2017-01-03 NOTE — Telephone Encounter (Signed)
Patient returned Amy's call about visco supplementation and I advised patient we would apply and the process takes a while (per Amy's note.)

## 2017-01-14 ENCOUNTER — Telehealth: Payer: Self-pay | Admitting: Rheumatology

## 2017-01-14 NOTE — Telephone Encounter (Signed)
Patient called to check status of Euflexxa. I advised patient this process can take a while and she voiced understanding.

## 2017-01-20 ENCOUNTER — Other Ambulatory Visit: Payer: Self-pay | Admitting: Rheumatology

## 2017-01-21 NOTE — Telephone Encounter (Signed)
Last Visit: 11/07/16 Next Visit: 04/09/17 Labs: 11/07/16 Sodium 130 rest of labs WNL  Okay to refill per Dr. Estanislado Pandy

## 2017-01-24 NOTE — Telephone Encounter (Signed)
Patient called requesting a call to let her know where in the process she is for knee injections. She is trying to schedule some events, and it would be helpful to know what step she is in the process for her scheduling. Please call to advise.

## 2017-01-30 NOTE — Telephone Encounter (Signed)
In progress

## 2017-02-17 ENCOUNTER — Telehealth: Payer: Self-pay | Admitting: Rheumatology

## 2017-02-17 NOTE — Telephone Encounter (Signed)
Patient calling to check status of knee injections. Patient needs an update. Patient has been calling since August 1st, and has not gotten a response. Patient is having a lot of pain now with knee. Advised patient someone would call today to update.

## 2017-02-25 ENCOUNTER — Telehealth: Payer: Self-pay | Admitting: *Deleted

## 2017-02-25 NOTE — Telephone Encounter (Signed)
Patient's Hyalgan will be delivered to office 02/27/17. Bil  Knees, patient purchased. Please call patient to schedule appts w/Dr. Bronson Curb ASAP. Thank you.

## 2017-02-27 ENCOUNTER — Ambulatory Visit (INDEPENDENT_AMBULATORY_CARE_PROVIDER_SITE_OTHER): Payer: Medicare Other | Admitting: Rheumatology

## 2017-02-27 DIAGNOSIS — M1711 Unilateral primary osteoarthritis, right knee: Secondary | ICD-10-CM | POA: Diagnosis not present

## 2017-02-27 DIAGNOSIS — M1712 Unilateral primary osteoarthritis, left knee: Secondary | ICD-10-CM | POA: Diagnosis not present

## 2017-02-27 DIAGNOSIS — M170 Bilateral primary osteoarthritis of knee: Secondary | ICD-10-CM

## 2017-02-27 MED ORDER — LIDOCAINE HCL 1 % IJ SOLN
1.5000 mL | INTRAMUSCULAR | Status: AC | PRN
  Administered 2017-02-27: 1.5 mL

## 2017-02-27 MED ORDER — SODIUM HYALURONATE (VISCOSUP) 20 MG/2ML IX SOSY
20.0000 mg | PREFILLED_SYRINGE | INTRA_ARTICULAR | Status: AC | PRN
  Administered 2017-02-27: 20 mg via INTRA_ARTICULAR

## 2017-02-27 NOTE — Progress Notes (Signed)
   Procedure Note  Patient: FLORNCE RECORD             Date of Birth: Mar 19, 1941           MRN: [id number]             Visit Date: 03/06/2017  Procedures: Visit Diagnoses: Primary osteoarthritis of both knees - Plan: Large Joint Injection/Arthrocentesis, Large Joint Injection/Arthrocentesis Hyalgan #2 bilateral knees  Large Joint Inj Date/Time: 03/06/2017 12:58 PM Performed by: Bo Merino Authorized by: Bo Merino   Consent Given by:  Patient Site marked: the procedure site was marked   Timeout: prior to procedure the correct patient, procedure, and site was verified   Indications:  Pain Location:  Knee Site:  R knee Prep: patient was prepped and draped in usual sterile fashion   Needle Size:  27 G Needle Length:  1.5 inches Ultrasound Guidance: No   Fluoroscopic Guidance: No   Arthrogram: No   Medications:  1.5 mL lidocaine 1 %; 20 mg Sodium Hyaluronate 20 MG/2ML Aspiration Attempted: Yes   Patient tolerance:  Patient tolerated the procedure well with no immediate complications Large Joint Inj Date/Time: 03/06/2017 12:59 PM Performed by: Bo Merino Authorized by: Bo Merino   Consent Given by:  Patient Site marked: the procedure site was marked   Timeout: prior to procedure the correct patient, procedure, and site was verified   Indications:  Pain Location:  Knee Site:  L knee Prep: patient was prepped and draped in usual sterile fashion   Needle Size:  27 G Needle Length:  1.5 inches Ultrasound Guidance: No   Fluoroscopic Guidance: No   Arthrogram: No   Medications:  1.5 mL lidocaine 1 %; 20 mg Sodium Hyaluronate 20 MG/2ML Aspiration Attempted: Yes   Patient tolerance:  Patient tolerated the procedure well with no immediate complications    Bo Merino, MD

## 2017-02-27 NOTE — Progress Notes (Signed)
   Procedure Note  Patient: Felicia [name]             Date of Birth: 09-Mar-1941           MRN: [id number]             Visit Date: 02/27/2017  Procedures: Visit Diagnoses: Primary osteoarthritis of both knees - Plan: Large Joint Injection/Arthrocentesis, Large Joint Injection/Arthrocentesis   Hyalgan # 1 Bilateral knees, patient purchased  Large Joint Inj Date/Time: 02/27/2017 12:52 PM Performed by: Bo Merino Authorized by: Bo Merino   Consent Given by:  Patient Site marked: the procedure site was marked   Timeout: prior to procedure the correct patient, procedure, and site was verified   Indications:  Pain and joint swelling Location:  Knee Site:  R knee Prep: patient was prepped and draped in usual sterile fashion   Needle Size:  27 G Needle Length:  1.5 inches Approach:  Medial Ultrasound Guidance: No   Fluoroscopic Guidance: No   Arthrogram: No   Medications:  1.5 mL lidocaine 1 %; 20 mg Sodium Hyaluronate 20 MG/2ML Aspiration Attempted: Yes   Patient tolerance:  Patient tolerated the procedure well with no immediate complications Large Joint Inj Date/Time: 02/27/2017 12:53 PM Performed by: Bo Merino Authorized by: Bo Merino   Consent Given by:  Patient Site marked: the procedure site was marked   Timeout: prior to procedure the correct patient, procedure, and site was verified   Indications:  Pain and joint swelling Location:  Knee Site:  L knee Prep: patient was prepped and draped in usual sterile fashion   Needle Size:  27 G Needle Length:  1.5 inches Approach:  Medial Ultrasound Guidance: No   Fluoroscopic Guidance: No   Arthrogram: No   Medications:  1.5 mL lidocaine 1 %; 20 mg Sodium Hyaluronate 20 MG/2ML Aspiration Attempted: Yes   Patient tolerance:  Patient tolerated the procedure well with no immediate complications   Bo Merino, MD

## 2017-03-03 ENCOUNTER — Other Ambulatory Visit: Payer: Self-pay | Admitting: Internal Medicine

## 2017-03-06 ENCOUNTER — Ambulatory Visit (INDEPENDENT_AMBULATORY_CARE_PROVIDER_SITE_OTHER): Payer: Medicare Other | Admitting: Rheumatology

## 2017-03-06 DIAGNOSIS — M170 Bilateral primary osteoarthritis of knee: Secondary | ICD-10-CM

## 2017-03-06 DIAGNOSIS — M1712 Unilateral primary osteoarthritis, left knee: Secondary | ICD-10-CM

## 2017-03-06 DIAGNOSIS — M1711 Unilateral primary osteoarthritis, right knee: Secondary | ICD-10-CM | POA: Diagnosis not present

## 2017-03-06 MED ORDER — SODIUM HYALURONATE (VISCOSUP) 20 MG/2ML IX SOSY
20.0000 mg | PREFILLED_SYRINGE | INTRA_ARTICULAR | Status: AC | PRN
  Administered 2017-03-06: 20 mg via INTRA_ARTICULAR

## 2017-03-06 MED ORDER — LIDOCAINE HCL 1 % IJ SOLN
1.5000 mL | INTRAMUSCULAR | Status: AC | PRN
  Administered 2017-03-06: 1.5 mL

## 2017-03-06 NOTE — Progress Notes (Deleted)
   Procedure Note  Patient: Felicia [name]             Date of Birth: 13-Oct-1940           MRN: [id number]             Visit Date: 03/13/2017  Procedures: Visit Diagnoses: Primary osteoarthritis of both knees Hyalgan #3 bilateral knees patient purchased No procedures performed

## 2017-03-07 ENCOUNTER — Other Ambulatory Visit: Payer: Self-pay | Admitting: Rheumatology

## 2017-03-07 NOTE — Telephone Encounter (Signed)
Last Visit: 11/07/16 Next Visit: 04/09/17 Labs: 11/07/16 Sodium 130 rest of labs WNL TB Gold: 11/07/16 Neg  Patient to update labs 03/13/17

## 2017-03-12 ENCOUNTER — Other Ambulatory Visit: Payer: Self-pay | Admitting: Radiology

## 2017-03-12 ENCOUNTER — Other Ambulatory Visit (INDEPENDENT_AMBULATORY_CARE_PROVIDER_SITE_OTHER): Payer: Self-pay | Admitting: Radiology

## 2017-03-12 ENCOUNTER — Ambulatory Visit (INDEPENDENT_AMBULATORY_CARE_PROVIDER_SITE_OTHER): Payer: Medicare Other | Admitting: Rheumatology

## 2017-03-12 DIAGNOSIS — M1711 Unilateral primary osteoarthritis, right knee: Secondary | ICD-10-CM

## 2017-03-12 DIAGNOSIS — Z79899 Other long term (current) drug therapy: Secondary | ICD-10-CM

## 2017-03-12 DIAGNOSIS — M1712 Unilateral primary osteoarthritis, left knee: Secondary | ICD-10-CM

## 2017-03-12 DIAGNOSIS — M170 Bilateral primary osteoarthritis of knee: Secondary | ICD-10-CM

## 2017-03-12 LAB — COMPLETE METABOLIC PANEL WITH GFR
AG Ratio: 1.7 (calc) (ref 1.0–2.5)
ALT: 17 U/L (ref 6–29)
AST: 18 U/L (ref 10–35)
Albumin: 3.8 g/dL (ref 3.6–5.1)
Alkaline phosphatase (APISO): 67 U/L (ref 33–130)
BUN: 18 mg/dL (ref 7–25)
CO2: 24 mmol/L (ref 20–32)
Calcium: 9.6 mg/dL (ref 8.6–10.4)
Chloride: 98 mmol/L (ref 98–110)
Creat: 0.75 mg/dL (ref 0.60–0.93)
GFR, Est African American: 90 mL/min/{1.73_m2} (ref >=60)
GFR, Est Non African American: 78 mL/min/{1.73_m2} (ref >=60)
Globulin: 2.3 g/dL (calc) (ref 1.9–3.7)
Glucose, Bld: 84 mg/dL (ref 65–99)
Potassium: 5.4 mmol/L — ABNORMAL HIGH (ref 3.5–5.3)
Sodium: 130 mmol/L — ABNORMAL LOW (ref 135–146)
Total Bilirubin: 0.4 mg/dL (ref 0.2–1.2)
Total Protein: 6.1 g/dL (ref 6.1–8.1)

## 2017-03-12 LAB — CBC WITH DIFFERENTIAL/PLATELET
Basophils Absolute: 56 cells/uL (ref 0–200)
Basophils Relative: 0.7 %
Eosinophils Absolute: 88 cells/uL (ref 15–500)
Eosinophils Relative: 1.1 %
HCT: 35.4 % (ref 35.0–45.0)
Hemoglobin: 11.9 g/dL (ref 11.7–15.5)
Lymphs Abs: 2192 cells/uL (ref 850–3900)
MCH: 31 pg (ref 27.0–33.0)
MCHC: 33.6 g/dL (ref 32.0–36.0)
MCV: 92.2 fL (ref 80.0–100.0)
MPV: 9.3 fL (ref 7.5–12.5)
Monocytes Relative: 6.7 %
Neutro Abs: 5128 cells/uL (ref 1500–7800)
Neutrophils Relative %: 64.1 %
Platelets: 448 10*3/uL — ABNORMAL HIGH (ref 140–400)
RBC: 3.84 10*6/uL (ref 3.80–5.10)
RDW: 13.3 % (ref 11.0–15.0)
Total Lymphocyte: 27.4 %
WBC mixed population: 536 cells/uL (ref 200–950)
WBC: 8 10*3/uL (ref 3.8–10.8)

## 2017-03-12 MED ORDER — SODIUM HYALURONATE (VISCOSUP) 20 MG/2ML IX SOSY
20.0000 mg | PREFILLED_SYRINGE | INTRA_ARTICULAR | Status: AC | PRN
  Administered 2017-03-12: 20 mg via INTRA_ARTICULAR

## 2017-03-12 MED ORDER — "NEEDLE (DISP) 27G X 1/2"" MISC": 3 refills | Status: DC

## 2017-03-12 MED ORDER — "TUBERCULIN SYRINGE 27G X 1/2"" 1 ML MISC": 3 refills | Status: DC

## 2017-03-12 MED ORDER — LIDOCAINE HCL 1 % IJ SOLN
1.5000 mL | INTRAMUSCULAR | Status: AC | PRN
  Administered 2017-03-12: 1.5 mL

## 2017-03-12 NOTE — Progress Notes (Signed)
   Procedure Note  Patient: Felicia [name]             Date of Birth: 04/26/1941           MRN: [id number]             Visit Date: 03/12/2017  Procedures: Visit Diagnoses: Bilateral primary osteoarthritis of knee - Plan: Large Joint Injection/Arthrocentesis, Large Joint Injection/Arthrocentesis Hyalgan #3 bilateral knees  Large Joint Inj Date/Time: 03/12/2017 11:14 AM Performed by: Bo Merino Authorized by: Bo Merino   Consent Given by:  Patient Site marked: the procedure site was marked   Timeout: prior to procedure the correct patient, procedure, and site was verified   Indications:  Pain Location:  Knee Site:  R knee Prep: patient was prepped and draped in usual sterile fashion   Needle Size:  27 G Needle Length:  1.5 inches Ultrasound Guidance: No   Fluoroscopic Guidance: No   Arthrogram: No   Medications:  1.5 mL lidocaine 1 %; 20 mg Sodium Hyaluronate 20 MG/2ML Aspiration Attempted: Yes   Patient tolerance:  Patient tolerated the procedure well with no immediate complications Large Joint Inj Date/Time: 03/12/2017 11:15 AM Performed by: Bo Merino Authorized by: Bo Merino   Consent Given by:  Patient Site marked: the procedure site was marked   Timeout: prior to procedure the correct patient, procedure, and site was verified   Indications:  Pain Location:  Knee Site:  L knee Prep: patient was prepped and draped in usual sterile fashion   Needle Size:  27 G Needle Length:  1.5 inches Ultrasound Guidance: No   Fluoroscopic Guidance: No   Arthrogram: No   Medications:  1.5 mL lidocaine 1 %; 20 mg Sodium Hyaluronate 20 MG/2ML Aspiration Attempted: Yes   Patient tolerance:  Patient tolerated the procedure well with no immediate complications   Bo Merino, MD

## 2017-03-12 NOTE — Telephone Encounter (Signed)
Patient updated labs 03/12/17.   Okay to refill per Dr. Estanislado Pandy

## 2017-03-13 ENCOUNTER — Telehealth: Payer: Self-pay | Admitting: Radiology

## 2017-03-13 ENCOUNTER — Ambulatory Visit: Payer: Medicare Other | Admitting: Rheumatology

## 2017-03-13 NOTE — Telephone Encounter (Signed)
-----   Message from Bo Merino, MD sent at 03/13/2017  9:10 AM EDT ----- K elevated. Notify Pt. And fax to her PCP.

## 2017-03-13 NOTE — Telephone Encounter (Signed)
I have called patient to advise.  

## 2017-03-13 NOTE — Progress Notes (Signed)
K elevated. Notify Pt. And fax to her PCP.

## 2017-03-18 ENCOUNTER — Encounter: Payer: Self-pay | Admitting: Registered Nurse

## 2017-03-20 ENCOUNTER — Ambulatory Visit (INDEPENDENT_AMBULATORY_CARE_PROVIDER_SITE_OTHER): Payer: Medicare Other | Admitting: Rheumatology

## 2017-03-20 DIAGNOSIS — M170 Bilateral primary osteoarthritis of knee: Secondary | ICD-10-CM

## 2017-03-20 MED ORDER — SODIUM HYALURONATE (VISCOSUP) 20 MG/2ML IX SOSY
20.0000 mg | PREFILLED_SYRINGE | INTRA_ARTICULAR | Status: AC | PRN
  Administered 2017-03-20: 20 mg via INTRA_ARTICULAR

## 2017-03-20 MED ORDER — SODIUM HYALURONATE (VISCOSUP) 20 MG/2ML IX SOSY
20.0000 mg | PREFILLED_SYRINGE | INTRA_ARTICULAR | Status: AC | PRN
Start: 2017-03-20 — End: 2017-03-20
  Administered 2017-03-20: 20 mg via INTRA_ARTICULAR

## 2017-03-20 MED ORDER — LIDOCAINE HCL 1 % IJ SOLN
1.5000 mL | INTRAMUSCULAR | Status: AC | PRN
  Administered 2017-03-20: 1.5 mL

## 2017-03-20 NOTE — Progress Notes (Signed)
   Procedure Note  Patient: Felicia [name]             Date of Birth: 1941-05-07           MRN: [id number]             Visit Date: 03/20/2017  Procedures: Visit Diagnoses: No diagnosis found.   Hyalgan # 4 Bil, patient purchased   Large Joint Inj Date/Time: 03/20/2017 1:00 PM Performed by: Bo Merino Authorized by: Bo Merino   Consent Given by:  Patient Site marked: the procedure site was marked   Timeout: prior to procedure the correct patient, procedure, and site was verified   Indications:  Pain and joint swelling Location:  Knee Site:  R knee Prep: patient was prepped and draped in usual sterile fashion   Needle Size:  27 G Needle Length:  1.5 inches Approach:  Medial Ultrasound Guidance: No   Fluoroscopic Guidance: No   Arthrogram: No   Medications:  20 mg Sodium Hyaluronate 20 MG/2ML; 1.5 mL lidocaine 1 % Aspiration Attempted: Yes   Patient tolerance:  Patient tolerated the procedure well with no immediate complications Large Joint Inj Date/Time: 03/20/2017 1:02 PM Performed by: Bo Merino Authorized by: Bo Merino   Consent Given by:  Patient Site marked: the procedure site was marked   Timeout: prior to procedure the correct patient, procedure, and site was verified   Indications:  Pain and joint swelling Location:  Knee Site:  L knee Prep: patient was prepped and draped in usual sterile fashion   Needle Size:  27 G Needle Length:  1.5 inches Approach:  Medial Ultrasound Guidance: No   Fluoroscopic Guidance: No   Arthrogram: No   Medications:  20 mg Sodium Hyaluronate 20 MG/2ML; 1.5 mL lidocaine 1 % Aspiration Attempted: Yes   Patient tolerance:  Patient tolerated the procedure well with no immediate complications    Bo Merino, MD

## 2017-03-27 ENCOUNTER — Ambulatory Visit (INDEPENDENT_AMBULATORY_CARE_PROVIDER_SITE_OTHER): Payer: Medicare Other | Admitting: Rheumatology

## 2017-03-27 DIAGNOSIS — G8929 Other chronic pain: Secondary | ICD-10-CM

## 2017-03-27 DIAGNOSIS — M25561 Pain in right knee: Secondary | ICD-10-CM | POA: Diagnosis not present

## 2017-03-27 DIAGNOSIS — M25562 Pain in left knee: Secondary | ICD-10-CM | POA: Diagnosis not present

## 2017-03-27 DIAGNOSIS — M170 Bilateral primary osteoarthritis of knee: Secondary | ICD-10-CM | POA: Diagnosis not present

## 2017-03-27 MED ORDER — LIDOCAINE HCL 1 % IJ SOLN
1.5000 mL | INTRAMUSCULAR | Status: AC | PRN
  Administered 2017-03-27: 1.5 mL

## 2017-03-27 MED ORDER — SODIUM HYALURONATE (VISCOSUP) 20 MG/2ML IX SOSY
20.0000 mg | PREFILLED_SYRINGE | INTRA_ARTICULAR | Status: AC | PRN
  Administered 2017-03-27: 20 mg via INTRA_ARTICULAR

## 2017-03-27 NOTE — Progress Notes (Addendum)
   Procedure Note  Patient: Felicia [name]             Date of Birth: Aug 22, 1940           MRN: [id number]             Visit Date: 03/27/2017  Procedures: Visit Diagnoses: Primary osteoarthritis of both knees  Chronic pain of both knees Hyalgan #5 BKJ Large Joint Inj Date/Time: 03/27/2017 1:23 PM Performed by: Bo Merino Authorized by: Bo Merino   Consent Given by:  Patient Site marked: the procedure site was marked   Timeout: prior to procedure the correct patient, procedure, and site was verified   Indications:  Pain and joint swelling Location:  Knee Site:  R knee Prep: patient was prepped and draped in usual sterile fashion   Needle Size:  27 G Needle Length:  1.5 inches Approach:  Medial Ultrasound Guidance: No   Fluoroscopic Guidance: No   Arthrogram: No   Medications:  1.5 mL lidocaine 1 %; 20 mg Sodium Hyaluronate 20 MG/2ML Aspiration Attempted: Yes   Aspirate amount (mL):  0 Patient tolerance:  Patient tolerated the procedure well with no immediate complications Large Joint Inj Date/Time: 03/27/2017 1:25 PM Performed by: Bo Merino Authorized by: Bo Merino   Consent Given by:  Patient Site marked: the procedure site was marked   Timeout: prior to procedure the correct patient, procedure, and site was verified   Indications:  Pain and joint swelling Location:  Knee Site:  L knee Prep: patient was prepped and draped in usual sterile fashion   Needle Size:  27 G Needle Length:  1.5 inches Approach:  Medial Ultrasound Guidance: No   Fluoroscopic Guidance: No   Arthrogram: No   Medications:  20 mg Sodium Hyaluronate 20 MG/2ML; 1.5 mL lidocaine 1 % Aspiration Attempted: Yes   Aspirate amount (mL):  0 Patient tolerance:  Patient tolerated the procedure well with no immediate complications   Bo Merino, MD  lidocaine 1% lot#9 0-0 30-DK expiration 11/02/2018

## 2017-03-29 NOTE — Progress Notes (Signed)
Office Visit Note  Patient: Felicia [name]             Date of Birth: 08/29/1940           MRN: [id number]             PCP: Thressa Sheller, MD Referring: Thressa Sheller, MD Visit Date: 04/09/2017 Occupation: @GUAROCC @    Subjective:  Pain in bilateral knees.   History of Present Illness: Felicia [name] is a 76 y.o. female with history of rheumatoid arthritis and osteoarthritis overlap. She's been on combination of Orencia and methotrexate which is been working fairly well for her. She states she has intermittent discomfort in her joints. She recently finished Hyalgan injections to bilateral knee joints. She states she continues to have some instability and discomfort in her knees. She has occasional lower back pain. She denies any neck discomfort.  Activities of Daily Living:  Patient reports morning stiffness for 1 hour.   Patient Reports nocturnal pain.  Difficulty dressing/grooming: Denies Difficulty climbing stairs: Reports Difficulty getting out of chair: Reports Difficulty using hands for taps, buttons, cutlery, and/or writing: Denies   Review of Systems  Constitutional: Positive for fatigue. Negative for night sweats, weight gain, weight loss and weakness.  HENT: Negative for mouth sores, trouble swallowing, trouble swallowing, mouth dryness and nose dryness.   Eyes: Positive for dryness. Negative for pain, redness and visual disturbance.  Respiratory: Negative for cough, shortness of breath and difficulty breathing.   Cardiovascular: Positive for hypertension. Negative for chest pain, palpitations, irregular heartbeat and swelling in legs/feet.  Gastrointestinal: Negative for blood in stool, constipation and diarrhea.  Endocrine: Negative for increased urination.  Genitourinary: Negative for vaginal dryness.  Musculoskeletal: Positive for arthralgias, joint pain and morning stiffness. Negative for joint swelling, myalgias, muscle weakness, muscle tenderness and myalgias.    Skin: Negative for color change, rash, hair loss, skin tightness, ulcers and sensitivity to sunlight.  Allergic/Immunologic: Negative for susceptible to infections.  Neurological: Negative for dizziness, memory loss and night sweats.  Hematological: Negative for swollen glands.  Psychiatric/Behavioral: Negative for depressed mood and sleep disturbance. The patient is not nervous/anxious.     PMFS History:  Patient Active Problem List   Diagnosis Date Noted  . Primary osteoarthritis of both knees 03/06/2017  . Exertional chest pain   . Pain in the chest 03/27/2015  . Postural hypotension 05/26/2013  . OSA (obstructive sleep apnea) 03/22/2013  . Osteoarthritis 11/07/2011  . Abdominal bruit 10/16/2011  . HTN (hypertension) 08/13/2011  . Anisocoria 08/13/2011  . Cervical radiculopathy 07/17/2011  . DDD (degenerative disc disease), cervical 07/10/2011  . Glaucoma 06/06/2011  . HIATAL HERNIA 01/30/2010  . POSTMENOPAUSAL SYNDROME 12/14/2009  . EUSTACHIAN TUBE DYSFUNCTION, RIGHT 06/28/2009  . GERD 12/07/2008  . Rheumatoid arthritis ([place]) 12/07/2008  . COLONIC POLYPS, HX OF 12/07/2008  . HYPERLIPIDEMIA 10/17/2006  . HYPERCALCEMIA 10/17/2006  . MITRAL VALVE PROLAPSE 10/17/2006    Past Medical History:  Diagnosis Date  . Adenomatous colon polyp 2004   Colo in 2009 "no polyps"  . Complication of anesthesia    small airway per patient  . GERD (gastroesophageal reflux disease)   . Glaucoma   . Hiatal hernia   . Hypertension   . Rheumatoid arthritis(714.0)    Orencia; Methotrexate (Dr. Patrecia Pour)  . Sleep apnea    on CPAP    Family History  Problem Relation Age of Onset  . Dementia Mother   . Lung disease Mother  bronchiectasis  . Heart disease Mother        Aortic Stenosis  . Osteoporosis Mother   . Heart attack Father 15       S/P CBAG  . Stroke Maternal Grandmother 83  . Ulcerative colitis Brother   . Diabetes Neg Hx   . Cancer Neg Hx    Past Surgical  History:  Procedure Laterality Date  . CATARACT EXTRACTION, BILATERAL    . COLONOSCOPY W/ POLYPECTOMY     X1; negative subsequently  . DILATION AND CURETTAGE OF UTERUS    . EYE SURGERY     due to RA  . SHOULDER SURGERY Left    due to RA  . TRABECULECTOMY     X 2 OD; X 1 OS  . UPPER GASTROINTESTINAL ENDOSCOPY     hiatal hernia   Social History   Social History Narrative  . Not on file     Objective: Vital Signs: BP 134/68 (BP Location: Left Arm, Patient Position: Sitting, Cuff Size: Normal)   Pulse 66   Resp 16   Ht 5\' 6"  (1.676 m)   Wt 135 lb (61.2 kg)   BMI 21.79 kg/m    Physical Exam  Constitutional: She is oriented to person, place, and time. She appears well-developed and well-nourished.  HENT:  Head: Normocephalic and atraumatic.  Eyes: Conjunctivae and EOM are normal.  Neck: Normal range of motion.  Cardiovascular: Normal rate, regular rhythm, normal heart sounds and intact distal pulses.  Pulmonary/Chest: Effort normal and breath sounds normal.  Abdominal: Soft. Bowel sounds are normal.  Lymphadenopathy:    She has no cervical adenopathy.  Neurological: She is alert and oriented to person, place, and time.  Skin: Skin is warm and dry. Capillary refill takes less than 2 seconds.  Psychiatric: She has a normal mood and affect. Her behavior is normal.  Nursing note and vitals reviewed.    Musculoskeletal Exam: C-spine and thoracic lumbar spine good range of motion. She she has some stiffness with range of motion of her C-spine and lumbar spine. Shoulder joints elbow joints wrist joints are good range of motion. She had no synovitis of her wrist joint or MCP joints. She is thickening of PIP/DIP joints with no synovitis. Hip joints knee joints ankles were good range of motion. She has no synovitis over MCPs or PIP joints. No warmth swelling or effusion was noted in the knee joints.  CDAI Exam: CDAI Homunculus Exam:   Tenderness:  RLE: tibiofemoral LLE:  tibiofemoral  Joint Counts:  CDAI Tender Joint count: 2 CDAI Swollen Joint count: 0  Global Assessments:  Patient Global Assessment: 7 Provider Global Assessment: 4  CDAI Calculated Score: 13    Investigation: No additional findings.TB Gold: 11/2016 Negative  CBC Latest Ref Rng & Units 03/12/2017 11/07/2016 09/04/2016  WBC 3.8 - 10.8 Thousand/uL 8.0 9.6 7.8  Hemoglobin 11.7 - 15.5 g/dL 11.9 13.2 11.6(L)  Hematocrit 35.0 - 45.0 % 35.4 40.4 35.6  Platelets 140 - 400 Thousand/uL 448(H) 509(H) 404(H)   CMP Latest Ref Rng & Units 03/12/2017 11/07/2016 09/04/2016  Glucose 65 - 99 mg/dL 84 97 109(H)  BUN 7 - 25 mg/dL 18 14 16   Creatinine 0.60 - 0.93 mg/dL 0.75 0.83 0.80  Sodium 135 - 146 mmol/L 130(L) 130(L) 128(L)  Potassium 3.5 - 5.3 mmol/L 5.4(H) 5.6(H) 4.9  Chloride 98 - 110 mmol/L 98 97(L) 97(L)  CO2 20 - 32 mmol/L 24 23 23   Calcium 8.6 - 10.4 mg/dL 9.6 10.2 9.4  Total Protein 6.1 - 8.1 g/dL 6.1 7.0 6.0(L)  Total Bilirubin 0.2 - 1.2 mg/dL 0.4 0.7 0.5  Alkaline Phos 33 - 130 U/L - 71 61  AST 10 - 35 U/L 18 16 18   ALT 6 - 29 U/L 17 15 14     Imaging: Xr Foot 2 Views Left  Result Date: 04/09/2017 Right first MTP subluxation narrowing of first, fourth and fifth MTP joint with erosive changes in the fifth MTP. PIP/DIP narrowing. Impression: These findings are consistent with osteoarthritis and rheumatoid arthritis of the left foot.  Xr Foot 2 Views Right  Result Date: 04/09/2017 Juxta articular osteopenia noted. Narrowing of the first and fifth MTP joints was noted. Subluxation of the first MTP noted. Erosive changes noted and first and fifth MTP joints. PIP/DIP narrowing was noted. No significant intertarsal joint space narrowing was noted. These findings are consistent with osteoarthritis and rheumatoid arthritis overlap.  Xr Hand 2 View Left  Result Date: 04/09/2017 Left first second and third MCP joint narrowing noted. All PIP/DIP severe narrowing noted. Severe CMC narrowing with  subluxation noted. No intercarpal joint space narrowing noted. No erosive changes noted. Juxta articular osteopenia noted. Impression: These findings are consistent with osteoarthritis and rheumatoid arthritis overlap.  Xr Hand 2 View Right  Result Date: 04/09/2017 Right first second and third MCP joint space narrowing was noted. Severe CMC PIP/DIP narrowing was noted. No significant intercarpal joint space narrowing was noted. No erosive changes were noted. Impression: These findings are consistent with rheumatoid arthritis and osteoarthritis overlap.  Xr Knee 3 View Left  Result Date: 04/09/2017 Moderate medial compartment narrowing noted. No chondrocalcinosis noted. Mild patellofemoral narrowing noted. Impression: These findings are consistent with moderate osteoarthritis  Xr Knee 3 View Right  Result Date: 04/09/2017 Moderate medial compartment narrowing noted. No chondrocalcinosis noted. Mild patellofemoral narrowing noted. Impression: These findings are consistent with moderate osteoarthritis.   Speciality Comments: No specialty comments available.    Procedures:  No procedures performed Allergies: Sulfonamide derivatives; Amlodipine; Zostavax [zoster vaccine live]; and Norvasc [amlodipine besylate]   Assessment / Plan:     Visit Diagnoses: Rheumatoid arthritis involving multiple sites with positive rheumatoid factor (HCC) - +RF, +ANA. She continues to have arthralgias and discomfort. I do not see any synovitis on examination today.  High risk medication use - Orencia SQ q month, MTX 0.8 ml sq q wk, folic acid 2 mg po qd. Her labs are stable we'll continue to monitor every 3 months.  Primary osteoarthritis of both knees -she has had recent Visco supplement injections with ongoing lower extremity weakness and discomfort. Plan: Ambulatory referral to Physical Therapy  Primary osteoarthritis of both hands: She has severe osteoarthritis which is causing discomfort. Joint protection  and muscle strengthening discussed.  DDD (degenerative disc disease), lumbar: Chronic pain  DDD (degenerative disc disease), cervical: Doing fairly well.  Pain in both hands - Plan: XR Hand 2 View Right, XR Hand 2 View Left  Chronic pain of both knees - Plan: XR KNEE 3 VIEW RIGHT, XR KNEE 3 VIEW LEFT  Pain in both feet - Plan: XR Foot 2 Views Right, XR Foot 2 Views Left   Other medical problems are listed as follows:  History of gastroesophageal reflux (GERD)  History of glaucoma  History of hypertension  History of anemia  History of sleep apnea  History of colonic polyps  History of hyperlipidemia    Orders: Orders Placed This Encounter  Procedures  . XR Hand 2 View Right  .  XR Hand 2 View Left  . XR KNEE 3 VIEW RIGHT  . XR KNEE 3 VIEW LEFT  . XR Foot 2 Views Right  . XR Foot 2 Views Left  . Ambulatory referral to Physical Therapy   No orders of the defined types were placed in this encounter.   Face-to-face time spent with patient was 30 minutes.greater than 50% of time was spent in counseling and coordination of care.  Follow-Up Instructions: Return in about 5 months (around 09/07/2017) for Rheumatoid arthritis, Osteoarthritis.   Bo Merino, MD  Note - This record has been created using Editor, commissioning.  Chart creation errors have been sought, but may not always  have been located. Such creation errors do not reflect on  the standard of medical care.

## 2017-04-01 ENCOUNTER — Other Ambulatory Visit: Payer: Self-pay | Admitting: Rheumatology

## 2017-04-01 NOTE — Telephone Encounter (Signed)
Last Visit: 11/07/16 Next Visit: 04/09/17 Labs: 03/12/17 K elevated. PCP aware.  Okay to refill per Dr. Estanislado Pandy

## 2017-04-07 ENCOUNTER — Other Ambulatory Visit: Payer: Self-pay | Admitting: Rheumatology

## 2017-04-07 NOTE — Telephone Encounter (Signed)
Last Visit: 11/07/16 Next Visit: 04/09/17  Okay to refill per Dr. Estanislado Pandy

## 2017-04-09 ENCOUNTER — Ambulatory Visit (INDEPENDENT_AMBULATORY_CARE_PROVIDER_SITE_OTHER): Payer: Medicare Other

## 2017-04-09 ENCOUNTER — Ambulatory Visit (INDEPENDENT_AMBULATORY_CARE_PROVIDER_SITE_OTHER): Payer: Self-pay

## 2017-04-09 ENCOUNTER — Ambulatory Visit: Payer: Medicare Other | Admitting: Rheumatology

## 2017-04-09 ENCOUNTER — Encounter: Payer: Self-pay | Admitting: Rheumatology

## 2017-04-09 VITALS — BP 134/68 | HR 66 | Resp 16 | Ht 66.0 in | Wt 135.0 lb

## 2017-04-09 DIAGNOSIS — Z8679 Personal history of other diseases of the circulatory system: Secondary | ICD-10-CM | POA: Diagnosis not present

## 2017-04-09 DIAGNOSIS — M79672 Pain in left foot: Secondary | ICD-10-CM | POA: Diagnosis not present

## 2017-04-09 DIAGNOSIS — M25561 Pain in right knee: Secondary | ICD-10-CM

## 2017-04-09 DIAGNOSIS — M79641 Pain in right hand: Secondary | ICD-10-CM

## 2017-04-09 DIAGNOSIS — M5136 Other intervertebral disc degeneration, lumbar region: Secondary | ICD-10-CM

## 2017-04-09 DIAGNOSIS — M170 Bilateral primary osteoarthritis of knee: Secondary | ICD-10-CM

## 2017-04-09 DIAGNOSIS — G8929 Other chronic pain: Secondary | ICD-10-CM

## 2017-04-09 DIAGNOSIS — Z8639 Personal history of other endocrine, nutritional and metabolic disease: Secondary | ICD-10-CM | POA: Diagnosis not present

## 2017-04-09 DIAGNOSIS — Z860100 Personal history of colon polyps, unspecified: Secondary | ICD-10-CM

## 2017-04-09 DIAGNOSIS — Z79899 Other long term (current) drug therapy: Secondary | ICD-10-CM

## 2017-04-09 DIAGNOSIS — M79671 Pain in right foot: Secondary | ICD-10-CM

## 2017-04-09 DIAGNOSIS — Z862 Personal history of diseases of the blood and blood-forming organs and certain disorders involving the immune mechanism: Secondary | ICD-10-CM

## 2017-04-09 DIAGNOSIS — M19041 Primary osteoarthritis, right hand: Secondary | ICD-10-CM | POA: Diagnosis not present

## 2017-04-09 DIAGNOSIS — Z8719 Personal history of other diseases of the digestive system: Secondary | ICD-10-CM | POA: Diagnosis not present

## 2017-04-09 DIAGNOSIS — M79642 Pain in left hand: Secondary | ICD-10-CM | POA: Diagnosis not present

## 2017-04-09 DIAGNOSIS — M19042 Primary osteoarthritis, left hand: Secondary | ICD-10-CM

## 2017-04-09 DIAGNOSIS — M5030 Other cervical disc degeneration, unspecified cervical region: Secondary | ICD-10-CM | POA: Diagnosis not present

## 2017-04-09 DIAGNOSIS — Z8669 Personal history of other diseases of the nervous system and sense organs: Secondary | ICD-10-CM | POA: Diagnosis not present

## 2017-04-09 DIAGNOSIS — M0579 Rheumatoid arthritis with rheumatoid factor of multiple sites without organ or systems involvement: Secondary | ICD-10-CM

## 2017-04-09 DIAGNOSIS — M25562 Pain in left knee: Secondary | ICD-10-CM

## 2017-04-09 DIAGNOSIS — M51369 Other intervertebral disc degeneration, lumbar region without mention of lumbar back pain or lower extremity pain: Secondary | ICD-10-CM

## 2017-04-09 NOTE — Patient Instructions (Signed)
Standing Labs We placed an order today for your standing lab work.    Please come back and get your standing labs in January and every 3 months  We have open lab Monday through Friday from 8:30-11:30 AM and 1:30-4 PM at the office of Dr. Lilja Soland.   The office is located at [address], [address], [place], [place] [address] No appointment is necessary.   Labs are drawn by [facility].  You may receive a bill from [facility] for your lab work. If you have any questions regarding directions or hours of operation,  please call [phone].    

## 2017-04-14 ENCOUNTER — Ambulatory Visit: Payer: Medicare Other | Admitting: Pulmonary Disease

## 2017-04-14 ENCOUNTER — Encounter: Payer: Self-pay | Admitting: Pulmonary Disease

## 2017-04-14 DIAGNOSIS — G4733 Obstructive sleep apnea (adult) (pediatric): Secondary | ICD-10-CM

## 2017-04-14 DIAGNOSIS — J069 Acute upper respiratory infection, unspecified: Secondary | ICD-10-CM | POA: Insufficient documentation

## 2017-04-14 DIAGNOSIS — J00 Acute nasopharyngitis [common cold]: Secondary | ICD-10-CM | POA: Diagnosis not present

## 2017-04-14 NOTE — Progress Notes (Signed)
   Subjective:    Patient ID: Felicia [name], female    DOB: 25-Jan-1941, 76 y.o.   MRN: [id number]  HPI  76/F for followup of her obstructive sleep apnea.  she has a chronic pain syndrome from her rheumatoid arthritis that also bothers her at night.   Chief Complaint  Patient presents with  . Follow-up    55yr f/u for OSA. Does not use a CPAP machine. Thinks she has developed a sinus infection. Facial pressure.    She tried CPAP for 3-4 years   Finally stopped using this 6 months ago.  Feels like sleep is no worse.  Feels that the main problem with her sleep is pain related to rheumatoid arthritis.  Continues to feel less rested and has daytime fatigue but not sleepiness She uses a mouth guard made by her dentist for teeth grinding   she also reports URI symptoms with runny nose  And a dry cough, sore throat that started 2 days ago, no fevers   Significant tests/ events  HST 02/2013: AHI 13/hr 02/2015 download >> no residuals, good usage, few missed days, on 10 cm, no residuals    Review of Systems Patient denies significant dyspnea,cough, hemoptysis,  chest pain, palpitations, pedal edema, orthopnea, paroxysmal nocturnal dyspnea, lightheadedness, nausea, vomiting, abdominal or  leg pains      Objective:   Physical Exam   Gen. Pleasant, well-nourished, in no distress ENT - no thrush, no post nasal drip Neck: No JVD, no thyromegaly, no carotid bruits Lungs: no use of accessory muscles, no dullness to percussion, clear without rales or rhonchi  Cardiovascular: Rhythm regular, heart sounds  normal, no murmurs or gallops, no peripheral edema Musculoskeletal: No deformities, no cyanosis or clubbing         Assessment & Plan:

## 2017-04-14 NOTE — Assessment & Plan Note (Signed)
We discussed that the cardiovascular implications of mild OSA are negligible.  Okay to stay off CPAP since she does not feel that she can use this at all  Call us if your sleep gets worse and we can make a referral to the dentist Dr. Oneal Grout for an oral appliance for sleep apnea.

## 2017-04-14 NOTE — Patient Instructions (Signed)
Call us if your sleep gets worse and we can make a referral to the dentist Dr. Oneal Grout for an oral appliance for sleep apnea.  Call us for antibiotic prescription if phlegm turns green or yellow Okay to take over-the-counter store brand decongestant and Tylenol with plenty of oral fluids

## 2017-04-14 NOTE — Assessment & Plan Note (Signed)
Call us for antibiotic prescription if phlegm turns green or yellow Okay to take over-the-counter store brand decongestant and Tylenol with plenty of oral fluids

## 2017-04-16 ENCOUNTER — Telehealth: Payer: Self-pay | Admitting: Pulmonary Disease

## 2017-04-16 MED ORDER — AZITHROMYCIN 250 MG PO TABS: ORAL_TABLET | ORAL | 0 refills | Status: AC

## 2017-04-16 NOTE — Telephone Encounter (Signed)
Spoke with pt, she states she is having congestion and she is still coughing. She denies fever and SOB. She feels like the infection is going into the chest and she wants to catch it before it turns into something more. She states you offered the ABX but didn't take it at the [facility] [place]. RA please advise.   [facility]

## 2017-04-16 NOTE — Telephone Encounter (Signed)
[id number] 

## 2017-04-16 NOTE — Telephone Encounter (Signed)
Pt is aware of RA's recommendations and voiced her understanding.  Rx has been sent to preferred pharmacy. Nothing further needed.  

## 2017-05-27 ENCOUNTER — Other Ambulatory Visit: Payer: Self-pay | Admitting: Internal Medicine

## 2017-05-30 ENCOUNTER — Other Ambulatory Visit: Payer: Self-pay | Admitting: Rheumatology

## 2017-05-30 NOTE — Telephone Encounter (Signed)
Last Visit: 04/09/17 Next visit: 09/11/16 Labs: 03/18/17 stable TB Gold: 11/07/16 Neg  Okay to refill per Dr. Estanislado Pandy

## 2017-06-03 HISTORY — PX: EYE SURGERY: SHX253

## 2017-06-08 ENCOUNTER — Other Ambulatory Visit: Payer: Self-pay | Admitting: Rheumatology

## 2017-06-09 NOTE — Telephone Encounter (Signed)
Last Visit: 04/09/17 Next visit: 09/11/16 Labs: 03/18/17 stable  Okay to refill per Dr. Estanislado Pandy

## 2017-06-12 ENCOUNTER — Other Ambulatory Visit: Payer: Self-pay | Admitting: *Deleted

## 2017-06-12 ENCOUNTER — Other Ambulatory Visit: Payer: Self-pay

## 2017-06-12 DIAGNOSIS — Z79899 Other long term (current) drug therapy: Secondary | ICD-10-CM

## 2017-06-12 LAB — CBC WITH DIFFERENTIAL/PLATELET
Basophils Absolute: 59 cells/uL (ref 0–200)
Basophils Relative: 0.7 %
Eosinophils Absolute: 59 cells/uL (ref 15–500)
Eosinophils Relative: 0.7 %
HCT: 35.6 % (ref 35.0–45.0)
Hemoglobin: 12 g/dL (ref 11.7–15.5)
Lymphs Abs: 2092 cells/uL (ref 850–3900)
MCH: 31 pg (ref 27.0–33.0)
MCHC: 33.7 g/dL (ref 32.0–36.0)
MCV: 92 fL (ref 80.0–100.0)
MPV: 9.5 fL (ref 7.5–12.5)
Monocytes Relative: 6.8 %
Neutro Abs: 5620 cells/uL (ref 1500–7800)
Neutrophils Relative %: 66.9 %
Platelets: 492 10*3/uL — ABNORMAL HIGH (ref 140–400)
RBC: 3.87 10*6/uL (ref 3.80–5.10)
RDW: 13.1 % (ref 11.0–15.0)
Total Lymphocyte: 24.9 %
WBC mixed population: 571 cells/uL (ref 200–950)
WBC: 8.4 10*3/uL (ref 3.8–10.8)

## 2017-06-12 LAB — COMPLETE METABOLIC PANEL WITH GFR
AG Ratio: 1.6 (calc) (ref 1.0–2.5)
ALT: 13 U/L (ref 6–29)
AST: 18 U/L (ref 10–35)
Albumin: 4.1 g/dL (ref 3.6–5.1)
Alkaline phosphatase (APISO): 68 U/L (ref 33–130)
BUN: 14 mg/dL (ref 7–25)
CO2: 25 mmol/L (ref 20–32)
Calcium: 9.8 mg/dL (ref 8.6–10.4)
Chloride: 95 mmol/L — ABNORMAL LOW (ref 98–110)
Creat: 0.75 mg/dL (ref 0.60–0.93)
GFR, Est African American: 90 mL/min/{1.73_m2} (ref >=60)
GFR, Est Non African American: 78 mL/min/{1.73_m2} (ref >=60)
Globulin: 2.5 g/dL (calc) (ref 1.9–3.7)
Glucose, Bld: 92 mg/dL (ref 65–99)
Potassium: 5.2 mmol/L (ref 3.5–5.3)
Sodium: 126 mmol/L — ABNORMAL LOW (ref 135–146)
Total Bilirubin: 0.5 mg/dL (ref 0.2–1.2)
Total Protein: 6.6 g/dL (ref 6.1–8.1)

## 2017-06-13 NOTE — Progress Notes (Signed)
Sodium is low. Please notify patient and fax the results to her PCP.

## 2017-06-30 ENCOUNTER — Other Ambulatory Visit: Payer: Self-pay | Admitting: Rheumatology

## 2017-07-01 NOTE — Telephone Encounter (Signed)
Last Visit: 04/09/17 Next visit: 09/11/16  Okay to refill per Dr. Estanislado Pandy

## 2017-07-09 ENCOUNTER — Telehealth: Payer: Self-pay | Admitting: Rheumatology

## 2017-07-09 ENCOUNTER — Other Ambulatory Visit (INDEPENDENT_AMBULATORY_CARE_PROVIDER_SITE_OTHER): Payer: Self-pay | Admitting: *Deleted

## 2017-07-09 DIAGNOSIS — M8589 Other specified disorders of bone density and structure, multiple sites: Secondary | ICD-10-CM

## 2017-07-09 DIAGNOSIS — M0579 Rheumatoid arthritis with rheumatoid factor of multiple sites without organ or systems involvement: Secondary | ICD-10-CM

## 2017-07-09 NOTE — Telephone Encounter (Signed)
Ebony Hail from [facility] called to request an order for bone density test for the patient.  Patient is there getting her mammogram and wanted to save time and get her bone density test at the same time (which she states that Dr. Estanislado Pandy recommended)  [facility] # [phone]

## 2017-07-09 NOTE — Telephone Encounter (Signed)
Patient is at [facility] to have Met[place] and wants to have bone density test done today as well, can you please fax order to [facility] for this?

## 2017-07-09 NOTE — Telephone Encounter (Signed)
Spoke to [place] and advised that Ivin Booty is faxing the order now.

## 2017-07-21 ENCOUNTER — Telehealth: Payer: Self-pay | Admitting: *Deleted

## 2017-07-21 NOTE — Telephone Encounter (Signed)
Bone Density Scan 07-10-17  Osteopenia T-Score -2.3 Recommendations: Calcium, Vitamin D and Resistive Exercises.   Attempted to contact the patient and left message for patient to call the office.

## 2017-07-21 NOTE — Telephone Encounter (Signed)
Patient advised of results and recommendations. Patient verbalized understanding.  

## 2017-08-15 ENCOUNTER — Other Ambulatory Visit: Payer: Self-pay | Admitting: Rheumatology

## 2017-08-15 NOTE — Telephone Encounter (Signed)
Last Visit: 04/09/17 Next visit: 09/11/16 Labs: 06/12/17 Sodium is low  Okay to refill per Dr. Estanislado Pandy

## 2017-08-18 ENCOUNTER — Other Ambulatory Visit: Payer: Self-pay | Admitting: Rheumatology

## 2017-08-18 NOTE — Telephone Encounter (Signed)
Last Visit: 04/09/17 Next visit: 09/11/16 Labs: 06/12/17 Sodium is low TB Gold: 11/07/16 Neg   Okay to refill per Dr. Estanislado Pandy

## 2017-08-25 ENCOUNTER — Other Ambulatory Visit: Payer: Self-pay

## 2017-08-25 MED ORDER — ESOMEPRAZOLE MAGNESIUM 40 MG PO CPDR: 40.0000 mg | DELAYED_RELEASE_CAPSULE | Freq: Two times a day (BID) | ORAL | 0 refills | Status: DC

## 2017-08-25 NOTE — Telephone Encounter (Signed)
Patient seen in 9/ 2017, esomeprazole refilled as requested.

## 2017-08-27 ENCOUNTER — Other Ambulatory Visit: Payer: Self-pay | Admitting: *Deleted

## 2017-08-27 DIAGNOSIS — Z79899 Other long term (current) drug therapy: Secondary | ICD-10-CM

## 2017-08-27 NOTE — Telephone Encounter (Signed)
Error

## 2017-08-28 LAB — CBC WITH DIFFERENTIAL/PLATELET
Basophils Absolute: 60 cells/uL (ref 0–200)
Basophils Relative: 0.7 %
Eosinophils Absolute: 51 cells/uL (ref 15–500)
Eosinophils Relative: 0.6 %
HCT: 36.6 % (ref 35.0–45.0)
Hemoglobin: 12.3 g/dL (ref 11.7–15.5)
Lymphs Abs: 1632 cells/uL (ref 850–3900)
MCH: 30.8 pg (ref 27.0–33.0)
MCHC: 33.6 g/dL (ref 32.0–36.0)
MCV: 91.7 fL (ref 80.0–100.0)
MPV: 9.4 fL (ref 7.5–12.5)
Monocytes Relative: 9.1 %
Neutro Abs: 5984 cells/uL (ref 1500–7800)
Neutrophils Relative %: 70.4 %
Platelets: 446 10*3/uL — ABNORMAL HIGH (ref 140–400)
RBC: 3.99 10*6/uL (ref 3.80–5.10)
RDW: 13.7 % (ref 11.0–15.0)
Total Lymphocyte: 19.2 %
WBC mixed population: 774 cells/uL (ref 200–950)
WBC: 8.5 10*3/uL (ref 3.8–10.8)

## 2017-08-28 LAB — COMPLETE METABOLIC PANEL WITH GFR
AG Ratio: 1.4 (calc) (ref 1.0–2.5)
ALT: 19 U/L (ref 6–29)
AST: 21 U/L (ref 10–35)
Albumin: 3.8 g/dL (ref 3.6–5.1)
Alkaline phosphatase (APISO): 72 U/L (ref 33–130)
BUN: 14 mg/dL (ref 7–25)
CO2: 25 mmol/L (ref 20–32)
Calcium: 9.7 mg/dL (ref 8.6–10.4)
Chloride: 97 mmol/L — ABNORMAL LOW (ref 98–110)
Creat: 0.72 mg/dL (ref 0.60–0.93)
GFR, Est African American: 95 mL/min/{1.73_m2} (ref >=60)
GFR, Est Non African American: 82 mL/min/{1.73_m2} (ref >=60)
Globulin: 2.7 g/dL (calc) (ref 1.9–3.7)
Glucose, Bld: 96 mg/dL (ref 65–99)
Potassium: 4.9 mmol/L (ref 3.5–5.3)
Sodium: 130 mmol/L — ABNORMAL LOW (ref 135–146)
Total Bilirubin: 0.4 mg/dL (ref 0.2–1.2)
Total Protein: 6.5 g/dL (ref 6.1–8.1)

## 2017-09-04 ENCOUNTER — Telehealth: Payer: Self-pay

## 2017-09-04 NOTE — Telephone Encounter (Signed)
Received a fax from [facility] stating that they have had unsuccessful attempts in contacting the pt to schedule shipment of [place]. Called pt to update. Pt states that they have been calling her so early and she does not want extra medication on hand. She has enough for two weeks now and continues to take it weekly. She plans to reach out to them and schedule a shipment today and let them know when to call her.     Margurette Brener, [place], CPhT 10:30 AM

## 2017-09-04 NOTE — Progress Notes (Signed)
Office Visit Note  Patient: Felicia [name]             Date of Birth: 07-01-1940           MRN: [id number]             PCP: Kathyrn Lass, MD Referring: Thressa Sheller, MD Visit Date: 09/18/2017 Occupation: @GUAROCC @    Subjective:  Bilateral hand pain    History of Present Illness: Felicia [name] is a 77 y.o. female with history of seropositive rheumatoid arthritis, osteoarthritis, and DDD.  Patient states she continues to take her Orencia subcutaneous injections once a month, methotrexate 0.8 mL weekly, and folic acid 2 mg daily.  She denies missing any doses recently.  She states that on Saturday she had a flare of her rheumatoid arthritis in her bilateral hands and wrists.  She states that it lasted for about 2 days.  She states that she was taking Tylenol for pain relief and using ice compresses.  She states she was having difficulty making a fist and having swelling in her bilateral hands.  She states that she continues to have stiffness in bilateral hands as well.  She denies any pain in any other joints at this time.  She states that her neck and lower back are doing well.  She denies any stiffness.  She states that she does stretching exercise on a regular basis.  She states that recently she was attending physical therapy and working on strengthening and gait stability.  She states that she has been trying to stay active.  She reports that her knee joints have improved.  But she would like to reapply for Hyalgan injections in bilateral knees.  She continues to take charge area to try to [place] in the past. She reports that she continues to have chronic hyponatremia.  She is having her PCP if follow-up on some testing.  She has been having a fluid restriction of 33-50 ounces of water a day.  She has been having increased muscle cramps as well as mild dryness and eye dryness due to her fluid restriction.    Activities of Daily Living:  Patient reports morning stiffness for 2-3 hours.     Patient Denies nocturnal pain.  Difficulty dressing/grooming: Denies Difficulty climbing stairs: Denies Difficulty getting out of chair: Denies Difficulty using hands for taps, buttons, cutlery, and/or writing: Reports   Review of Systems  Constitutional: Negative for fatigue.  HENT: Negative for mouth sores, mouth dryness and nose dryness.   Eyes: Negative for pain, visual disturbance and dryness.  Respiratory: Negative for cough, hemoptysis, shortness of breath and difficulty breathing.   Cardiovascular: Negative for chest pain, palpitations, hypertension and swelling in legs/feet.  Gastrointestinal: Negative for blood in stool, constipation and diarrhea.  Endocrine: Negative for increased urination.  Genitourinary: Negative for painful urination.  Musculoskeletal: Negative for arthralgias, joint pain, joint swelling, myalgias, muscle weakness, morning stiffness, muscle tenderness and myalgias.  Skin: Negative for color change, pallor, rash, hair loss, nodules/bumps, skin tightness, ulcers and sensitivity to sunlight.  Allergic/Immunologic: Negative for susceptible to infections.  Neurological: Negative for dizziness, numbness, headaches and weakness.  Hematological: Negative for swollen glands.  Psychiatric/Behavioral: Negative for depressed mood and sleep disturbance. The patient is not nervous/anxious.     PMFS History:  Patient Active Problem List   Diagnosis Date Noted  . URI (upper respiratory infection) 04/14/2017  . Primary osteoarthritis of both knees 03/06/2017  . Exertional chest pain   . Pain in  the chest 03/27/2015  . Postural hypotension 05/26/2013  . OSA (obstructive sleep apnea) 03/22/2013  . Osteoarthritis 11/07/2011  . Abdominal bruit 10/16/2011  . HTN (hypertension) 08/13/2011  . Anisocoria 08/13/2011  . Cervical radiculopathy 07/17/2011  . DDD (degenerative disc disease), cervical 07/10/2011  . Glaucoma 06/06/2011  . HIATAL HERNIA 01/30/2010  .  POSTMENOPAUSAL SYNDROME 12/14/2009  . EUSTACHIAN TUBE DYSFUNCTION, RIGHT 06/28/2009  . GERD 12/07/2008  . Rheumatoid arthritis ([place]) 12/07/2008  . COLONIC POLYPS, HX OF 12/07/2008  . HYPERLIPIDEMIA 10/17/2006  . HYPERCALCEMIA 10/17/2006  . MITRAL VALVE PROLAPSE 10/17/2006    Past Medical History:  Diagnosis Date  . Adenomatous colon polyp 2004   Colo in 2009 "no polyps"  . Complication of anesthesia    small airway per patient  . GERD (gastroesophageal reflux disease)   . Glaucoma   . Hiatal hernia   . Hypertension   . Rheumatoid arthritis(714.0)    Orencia; Methotrexate (Dr. Patrecia Pour)  . Sleep apnea    on CPAP    Family History  Problem Relation Age of Onset  . Dementia Mother   . Lung disease Mother        bronchiectasis  . Heart disease Mother        Aortic Stenosis  . Osteoporosis Mother   . Heart attack Father 31       S/P CBAG  . Stroke Maternal Grandmother 83  . Ulcerative colitis Brother   . Diabetes Neg Hx   . Cancer Neg Hx    Past Surgical History:  Procedure Laterality Date  . CARDIAC CATHETERIZATION N/A 03/29/2015   Procedure: Left Heart Cath and Coronary Angiography;  Surgeon: Belva Crome, MD;  Location: [facility];  Service: Cardiovascular;  Laterality: N/A;  . CATARACT EXTRACTION, BILATERAL    . COLONOSCOPY W/ POLYPECTOMY     X1; negative subsequently  . DILATION AND CURETTAGE OF UTERUS    . EYE SURGERY     due to RA  . EYE SURGERY Left 06/2017   eyelid tendon   . HYSTEROSCOPY W/D&C  06/24/2012   Procedure: DILATATION AND CURETTAGE /HYSTEROSCOPY;  Surgeon: [name], MD;  Location: [facility];  Service: Gynecology;  Laterality: N/A;  . SHOULDER SURGERY Left    due to RA  . TRABECULECTOMY     X 2 OD; X 1 OS  . UPPER GASTROINTESTINAL ENDOSCOPY     hiatal hernia   Social History   Social History Narrative  . Not on file     Objective: Vital Signs: BP 124/64 (BP Location: Left Arm, Patient Position: Sitting, Cuff Size:  Normal)   Pulse 64   Resp 16   Ht 5\' 6"  (1.676 m)   Wt 137 lb (62.1 kg)   BMI 22.11 kg/m    Physical Exam  Constitutional: She is oriented to person, place, and time. She appears well-developed and well-nourished.  HENT:  Head: Normocephalic and atraumatic.  Eyes: Conjunctivae and EOM are normal.  Neck: Normal range of motion.  Cardiovascular: Normal rate, regular rhythm, normal heart sounds and intact distal pulses.  Pulmonary/Chest: Effort normal and breath sounds normal.  Abdominal: Soft. Bowel sounds are normal.  Lymphadenopathy:    She has no cervical adenopathy.  Neurological: She is alert and oriented to person, place, and time.  Skin: Skin is warm and dry. Capillary refill takes less than 2 seconds.  Psychiatric: She has a normal mood and affect. Her behavior is normal.  Nursing note and vitals reviewed.  Musculoskeletal Exam: C-spine, thoracic, and lumbar spine good ROM.  No midline spinal tenderness.  No SI joint tenderness.  Shoulder joints, elbow joints, wrist joints, MCPs, PIPs, and DIPs good ROM with no synovitis.  PIP and DIP synovial thickening consistent with osteoarthritis.  Hip joints, knee joints, ankle joints, MTPs, PIPs, and DIPs good ROM with no synovitis.  No warmth or effusion of knee joints.    CDAI Exam: No CDAI exam completed.    Investigation: No additional findings. CBC Latest Ref Rng & Units 08/27/2017 06/12/2017 03/12/2017  WBC 3.8 - 10.8 Thousand/uL 8.5 8.4 8.0  Hemoglobin 11.7 - 15.5 g/dL 12.3 12.0 11.9  Hematocrit 35.0 - 45.0 % 36.6 35.6 35.4  Platelets 140 - 400 Thousand/uL 446(H) 492(H) 448(H)   CMP Latest Ref Rng & Units 08/27/2017 06/12/2017 03/12/2017  Glucose 65 - 99 mg/dL 96 92 84  BUN 7 - 25 mg/dL 14 14 18   Creatinine 0.60 - 0.93 mg/dL 0.72 0.75 0.75  Sodium 135 - 146 mmol/L 130(L) 126(L) 130(L)  Potassium 3.5 - 5.3 mmol/L 4.9 5.2 5.4(H)  Chloride 98 - 110 mmol/L 97(L) 95(L) 98  CO2 20 - 32 mmol/L 25 25 24   Calcium 8.6 - 10.4  mg/dL 9.7 9.8 9.6  Total Protein 6.1 - 8.1 g/dL 6.5 6.6 6.1  Total Bilirubin 0.2 - 1.2 mg/dL 0.4 0.5 0.4  Alkaline Phos 33 - 130 U/L - - -  AST 10 - 35 U/L 21 18 18   ALT 6 - 29 U/L 19 13 17     Imaging: No results found.  Speciality Comments: No specialty comments available.    Procedures:  No procedures performed Allergies: Sulfonamide derivatives; Amlodipine; Zostavax [zoster vaccine live]; and Norvasc [amlodipine besylate]   Assessment / Plan:     Visit Diagnoses: Rheumatoid arthritis involving multiple sites with positive rheumatoid factor (HCC) - RF+, ANA +: She has no active synovitis on exam.  She continues to inject Orencia subcutaneous injections once a month, methotrexate 0.8 mL weekly and folic acid 2 mg daily.  She has not missed any doses.  She had a rheumatoid arthritis flare on Saturday that lasted for 2 days.  She has mild tenderness to palpation of her MCP joints and bilateral wrist today.  She is going to continue on her current treatment regimen.  We are not going to start her on a prednisone taper due to her history of glaucoma.  She requested CCP, RF, and sed rate to be checked with her next labs.- Plan: Cyclic citrul peptide antibody, IgG, Rheumatoid factor, Sedimentation rate  High risk medication use - Orencia SQ q month, MTX 0.8 ml sq q wk, folic acid 2 mg po qd.TB gold: 11/07/16. CBC/CMP: 08/27/17.  CBC, CMP, TB gold will be checked with her labs in June.  She will have CBC and CMP every 3 months to monitor for drug toxicity.  She has chronic hyponatremia and is having testing performed by her PCP.  She is currently on a fluid restriction of 33-50 ounces of water a day.  She is having increased muscle cramps due to her fluid restriction, so we discussed trying magnesium malate 250 mg at bedtime.  We discussed side effects.  Primary osteoarthritis of both hands: She has PIP and DIP synovial thickening consistent with osteoarthritis.  Joint protection muscle strengthening  were discussed.  We discussed introducing natural anti-inflammatories to her diet.  She was given a list of these natural anti-inflammatories.  Primary osteoarthritis of both knees: No warmth or effusion  of her bilateral knees.  She is good range of motion.  She would like to reapply for Hyalgan injections in bilateral knees.  Her last Hyalgan injections were performed in October 2018.  DDD (degenerative disc disease), cervical: She has good range of motion.  She has no discomfort in her neck at this time.  DDD (degenerative disc disease), lumbar: No midline spinal tenderness or discomfort at this time.  Other medical conditions are listed as follows:  History of gastroesophageal reflux (GERD)  History of glaucoma  History of sleep apnea  History of colonic polyps  History of hyperlipidemia    Orders: Orders Placed This Encounter  Procedures  . Cyclic citrul peptide antibody, IgG  . Rheumatoid factor  . Sedimentation rate   No orders of the defined types were placed in this encounter.   Face-to-face time spent with patient was 30 minutes. >50% of time was spent in counseling and coordination of care.  Follow-Up Instructions: Return in about 5 months (around 02/18/2018) for Rheumatoid arthritis, Osteoarthritis.   Hazel Sams PA-C  I examined and evaluated the patient with Hazel Sams PA.  Patient had mild tenderness over her MCPs.  Not much synovitis was noted.  At this point I would like to continue current plan.  I have advised her to use diclofenac gel frequently over her MCPs.  I would avoid has history of glaucoma.  The plan of care was discussed as noted above.  Bo Merino, MD  Note - This record has been created using Editor, commissioning.  Chart creation errors have been sought, but may not always  have been located. Such creation errors do not reflect on  the standard of medical care.

## 2017-09-11 ENCOUNTER — Ambulatory Visit: Payer: Medicare Other | Admitting: Physician Assistant

## 2017-09-11 ENCOUNTER — Ambulatory Visit: Payer: Medicare Other | Admitting: Rheumatology

## 2017-09-18 ENCOUNTER — Ambulatory Visit: Payer: Medicare Other | Admitting: Physician Assistant

## 2017-09-18 ENCOUNTER — Encounter: Payer: Self-pay | Admitting: Physician Assistant

## 2017-09-18 VITALS — BP 124/64 | HR 64 | Resp 16 | Ht 66.0 in | Wt 137.0 lb

## 2017-09-18 DIAGNOSIS — M19042 Primary osteoarthritis, left hand: Secondary | ICD-10-CM

## 2017-09-18 DIAGNOSIS — Z79899 Other long term (current) drug therapy: Secondary | ICD-10-CM | POA: Diagnosis not present

## 2017-09-18 DIAGNOSIS — Z8639 Personal history of other endocrine, nutritional and metabolic disease: Secondary | ICD-10-CM

## 2017-09-18 DIAGNOSIS — M170 Bilateral primary osteoarthritis of knee: Secondary | ICD-10-CM | POA: Diagnosis not present

## 2017-09-18 DIAGNOSIS — Z8719 Personal history of other diseases of the digestive system: Secondary | ICD-10-CM

## 2017-09-18 DIAGNOSIS — M19041 Primary osteoarthritis, right hand: Secondary | ICD-10-CM | POA: Diagnosis not present

## 2017-09-18 DIAGNOSIS — Z8669 Personal history of other diseases of the nervous system and sense organs: Secondary | ICD-10-CM

## 2017-09-18 DIAGNOSIS — M0579 Rheumatoid arthritis with rheumatoid factor of multiple sites without organ or systems involvement: Secondary | ICD-10-CM

## 2017-09-18 DIAGNOSIS — Z860100 Personal history of colon polyps, unspecified: Secondary | ICD-10-CM

## 2017-09-18 DIAGNOSIS — M5030 Other cervical disc degeneration, unspecified cervical region: Secondary | ICD-10-CM | POA: Diagnosis not present

## 2017-09-18 DIAGNOSIS — M5136 Other intervertebral disc degeneration, lumbar region: Secondary | ICD-10-CM

## 2017-09-18 DIAGNOSIS — M51369 Other intervertebral disc degeneration, lumbar region without mention of lumbar back pain or lower extremity pain: Secondary | ICD-10-CM

## 2017-09-18 NOTE — Patient Instructions (Addendum)
Standing Labs We placed an order today for your standing lab work.    Please come back and get your standing labs in June and every 3 months   RF, CCP, Sed Rate, CBC, CMP, and TB gold   We have open lab Monday through Friday from 8:30-11:30 AM and 1:30-4:00 PM  at the office of Dr. Bo Merino.   You may experience shorter wait times on Monday and Friday afternoons. The office is located at [address] [place], [place],  [address] No appointment is necessary.   Labs are drawn by [facility].  You may receive a bill from [facility] for your lab work. If you have any questions regarding directions or hours of operation,  please call [phone].      Natural anti-inflammatories  You can purchase these at [employer], [employer] or online.  . Turmeric (capsules)  . Ginger (ginger root or capsules)  . Omega 3 (Fish, flax seeds, chia seeds, walnuts, almonds)  . Tart cherry (dried or extract)   Patient should be under the care of a physician while taking these supplements. This may not be reproduced without the permission of Dr. Bo Merino.

## 2017-09-26 ENCOUNTER — Other Ambulatory Visit: Payer: Self-pay | Admitting: Rheumatology

## 2017-09-26 NOTE — Telephone Encounter (Signed)
Last Visit: 09/08/17 Next Visit: 02/20/18  Okay to refill per Dr. Estanislado Pandy

## 2017-10-23 ENCOUNTER — Other Ambulatory Visit: Payer: Self-pay | Admitting: Rheumatology

## 2017-10-23 NOTE — Telephone Encounter (Signed)
Last visit: 09/18/2017 Next visit: 02/20/2018 Labs: 08/27/2017 stable   Okay to refill per Dr. Estanislado Pandy.

## 2017-11-04 ENCOUNTER — Telehealth (INDEPENDENT_AMBULATORY_CARE_PROVIDER_SITE_OTHER): Payer: Self-pay

## 2017-11-04 NOTE — Telephone Encounter (Signed)
Submitted application online for Monovisc injection, bilateral knee. 

## 2017-11-05 ENCOUNTER — Other Ambulatory Visit: Payer: Self-pay | Admitting: *Deleted

## 2017-11-05 DIAGNOSIS — M0579 Rheumatoid arthritis with rheumatoid factor of multiple sites without organ or systems involvement: Secondary | ICD-10-CM

## 2017-11-07 ENCOUNTER — Other Ambulatory Visit: Payer: Self-pay | Admitting: *Deleted

## 2017-11-07 ENCOUNTER — Other Ambulatory Visit: Payer: Self-pay

## 2017-11-07 DIAGNOSIS — M79671 Pain in right foot: Secondary | ICD-10-CM

## 2017-11-07 DIAGNOSIS — Z79899 Other long term (current) drug therapy: Secondary | ICD-10-CM

## 2017-11-07 DIAGNOSIS — M0579 Rheumatoid arthritis with rheumatoid factor of multiple sites without organ or systems involvement: Secondary | ICD-10-CM

## 2017-11-07 DIAGNOSIS — M79672 Pain in left foot: Secondary | ICD-10-CM

## 2017-11-07 DIAGNOSIS — Z9225 Personal history of immunosupression therapy: Secondary | ICD-10-CM

## 2017-11-08 LAB — CBC WITH DIFFERENTIAL/PLATELET
Basophils Absolute: 98 cells/uL (ref 0–200)
Basophils Relative: 1.2 %
Eosinophils Absolute: 172 cells/uL (ref 15–500)
Eosinophils Relative: 2.1 %
HCT: 34.4 % — ABNORMAL LOW (ref 35.0–45.0)
Hemoglobin: 11.8 g/dL (ref 11.7–15.5)
Lymphs Abs: 2222 cells/uL (ref 850–3900)
MCH: 31.8 pg (ref 27.0–33.0)
MCHC: 34.3 g/dL (ref 32.0–36.0)
MCV: 92.7 fL (ref 80.0–100.0)
MPV: 10.1 fL (ref 7.5–12.5)
Monocytes Relative: 7.2 %
Neutro Abs: 5117 cells/uL (ref 1500–7800)
Neutrophils Relative %: 62.4 %
Platelets: 355 10*3/uL (ref 140–400)
RBC: 3.71 10*6/uL — ABNORMAL LOW (ref 3.80–5.10)
RDW: 13.7 % (ref 11.0–15.0)
Total Lymphocyte: 27.1 %
WBC mixed population: 590 cells/uL (ref 200–950)
WBC: 8.2 10*3/uL (ref 3.8–10.8)

## 2017-11-08 LAB — COMPLETE METABOLIC PANEL WITH GFR
AG Ratio: 1.7 (calc) (ref 1.0–2.5)
ALT: 11 U/L (ref 6–29)
AST: 17 U/L (ref 10–35)
Albumin: 4 g/dL (ref 3.6–5.1)
Alkaline phosphatase (APISO): 74 U/L (ref 33–130)
BUN: 13 mg/dL (ref 7–25)
CO2: 26 mmol/L (ref 20–32)
Calcium: 9.7 mg/dL (ref 8.6–10.4)
Chloride: 98 mmol/L (ref 98–110)
Creat: 0.78 mg/dL (ref 0.60–0.93)
GFR, Est African American: 86 mL/min/{1.73_m2} (ref >=60)
GFR, Est Non African American: 74 mL/min/{1.73_m2} (ref >=60)
Globulin: 2.4 g/dL (calc) (ref 1.9–3.7)
Glucose, Bld: 81 mg/dL (ref 65–99)
Potassium: 4.8 mmol/L (ref 3.5–5.3)
Sodium: 130 mmol/L — ABNORMAL LOW (ref 135–146)
Total Bilirubin: 0.3 mg/dL (ref 0.2–1.2)
Total Protein: 6.4 g/dL (ref 6.1–8.1)

## 2017-11-10 LAB — RHEUMATOID FACTOR: Rhuematoid fact SerPl-aCnc: 32 IU/mL — ABNORMAL HIGH (ref <14)

## 2017-11-10 LAB — QUANTIFERON-TB GOLD PLUS
Mitogen-NIL: >10 IU/mL
NIL: 0.01 IU/mL
QuantiFERON-TB Gold Plus: NEGATIVE
TB1-NIL: 0 IU/mL
TB2-NIL: 0 IU/mL

## 2017-11-10 LAB — CYCLIC CITRUL PEPTIDE ANTIBODY, IGG: Cyclic Citrullin Peptide Ab: >250 UNITS — ABNORMAL HIGH

## 2017-11-10 LAB — SEDIMENTATION RATE: Sed Rate: 6 mm/h (ref 0–30)

## 2017-11-10 NOTE — Progress Notes (Signed)
Labs are stable.

## 2017-11-11 ENCOUNTER — Telehealth (INDEPENDENT_AMBULATORY_CARE_PROVIDER_SITE_OTHER): Payer: Self-pay | Admitting: Rheumatology

## 2017-11-11 NOTE — Telephone Encounter (Signed)
11/07/2017 labs faxed to Dr Kathyrn Lass [phone], ph [phone]

## 2017-11-18 ENCOUNTER — Other Ambulatory Visit: Payer: Self-pay | Admitting: *Deleted

## 2017-11-18 MED ORDER — "NEEDLE (DISP) 27G X 1/2"" MISC": 3 refills | Status: DC

## 2017-11-18 NOTE — Telephone Encounter (Signed)
Refill request received via fax  Last visit: 09/18/2017 Next visit: 02/20/2018  Okay to refill per Dr. Estanislado Pandy

## 2017-11-20 ENCOUNTER — Telehealth (INDEPENDENT_AMBULATORY_CARE_PROVIDER_SITE_OTHER): Payer: Self-pay

## 2017-11-20 NOTE — Telephone Encounter (Signed)
Called [employer] to initiate PA and was advised by [facility].that no PA is required for J7327(Monovisc).

## 2017-11-21 ENCOUNTER — Other Ambulatory Visit: Payer: Self-pay | Admitting: Rheumatology

## 2017-11-21 ENCOUNTER — Telehealth: Payer: Self-pay

## 2017-11-21 NOTE — Telephone Encounter (Signed)
Received refill request for patients esomeprazole however when I went to refill it 2 warnings are popping up about the dosage and interaction with other meds. Please advise if okay to refill Sir, thank you.

## 2017-11-21 NOTE — Telephone Encounter (Signed)
Last visit: 09/18/2017 Next visit: 02/20/2018 Labs: 11/07/17 stable TB Gold: 11/07/17 Neg   Okay to refill per Dr. Estanislado Pandy

## 2017-11-24 ENCOUNTER — Other Ambulatory Visit: Payer: Self-pay | Admitting: Internal Medicine

## 2017-11-24 DIAGNOSIS — K219 Gastro-esophageal reflux disease without esophagitis: Secondary | ICD-10-CM

## 2017-11-24 MED ORDER — ESOMEPRAZOLE MAGNESIUM 40 MG PO CPDR: 40.0000 mg | DELAYED_RELEASE_CAPSULE | Freq: Two times a day (BID) | ORAL | 3 refills | Status: DC

## 2017-11-24 NOTE — Telephone Encounter (Signed)
Done x 1 year

## 2017-11-25 ENCOUNTER — Other Ambulatory Visit: Payer: Self-pay

## 2017-11-25 MED ORDER — ESOMEPRAZOLE MAGNESIUM 40 MG PO CPDR: 40.0000 mg | DELAYED_RELEASE_CAPSULE | Freq: Two times a day (BID) | ORAL | 3 refills | Status: DC

## 2017-11-25 NOTE — Telephone Encounter (Signed)
Sent refill in on patient's esomeprazole to [facility]. It mistakenly got sent to [facility].

## 2017-12-11 ENCOUNTER — Telehealth: Payer: Self-pay | Admitting: Rheumatology

## 2017-12-11 NOTE — Telephone Encounter (Signed)
Patient states she is also having pain and swelling in both wrists and right hand.  Patient states she has stiffness in her fingers and is difficult to bend them. Patient states this is coming and going.Patient states this has been going on for about 3 weeks. Patient states she has been using tylenol and ice. Patient states the third finger will be the worst and she will be unable to bend it. Patient states she will also have trouble making a fist when this happen. Patient states this last for 2-3 days when this happens. Patient states she is currently on MTX 0.8 mL and Orencia weekly. Patient denies missing any doses. Patient states she is not currently having this issue but wanted to make you aware.

## 2017-12-11 NOTE — Telephone Encounter (Signed)
I discussed with the patient.  She states she has been having intermittent mild swelling.  I discussed possible use of low-dose prednisone at 5 mg p.o. daily.  She wants to hold off for right now.  She states if her symptoms persist she will let us know.

## 2017-12-11 NOTE — Telephone Encounter (Signed)
Patient called to let Dr. Estanislado Pandy know that she has a consultation appointment with Dr. Wylene Simmer about her feet.  Patient states she is also having pain and swelling in both wrists and right hand.  Patient states she has stiffness in her fingers and is difficult to bend them.

## 2017-12-26 ENCOUNTER — Other Ambulatory Visit: Payer: Self-pay | Admitting: Rheumatology

## 2017-12-26 NOTE — Telephone Encounter (Signed)
Last visit: 09/18/2017 Next visit: 02/20/2018  Okay to refill per Dr. Estanislado Pandy

## 2017-12-30 ENCOUNTER — Telehealth (INDEPENDENT_AMBULATORY_CARE_PROVIDER_SITE_OTHER): Payer: Self-pay

## 2017-12-30 NOTE — Telephone Encounter (Signed)
Error

## 2017-12-31 ENCOUNTER — Other Ambulatory Visit: Payer: Self-pay | Admitting: Rheumatology

## 2018-01-01 ENCOUNTER — Telehealth (INDEPENDENT_AMBULATORY_CARE_PROVIDER_SITE_OTHER): Payer: Self-pay

## 2018-01-01 NOTE — Telephone Encounter (Signed)
Can you please schedule patient appointment with Dr. Annett Fabian?  Patient approved for Monovisc, bilateral knee. Buy & Bill Covered at 100% No PA required Co-pay $85.00 Thank You.

## 2018-01-01 NOTE — Telephone Encounter (Signed)
Last visit: 09/18/2017 Next visit: 02/20/2018 Labs: 11/07/2017 stable.   Okay to refill per Dr. Estanislado Pandy.

## 2018-01-01 NOTE — Telephone Encounter (Signed)
LMOM for patient to call and schedule Monovisc injections.

## 2018-01-09 ENCOUNTER — Ambulatory Visit: Payer: Medicare Other | Admitting: Physician Assistant

## 2018-01-09 DIAGNOSIS — M1712 Unilateral primary osteoarthritis, left knee: Secondary | ICD-10-CM

## 2018-01-09 DIAGNOSIS — M1711 Unilateral primary osteoarthritis, right knee: Secondary | ICD-10-CM

## 2018-01-09 DIAGNOSIS — M170 Bilateral primary osteoarthritis of knee: Secondary | ICD-10-CM

## 2018-01-09 MED ORDER — LIDOCAINE HCL 1 % IJ SOLN
1.5000 mL | INTRAMUSCULAR | Status: AC | PRN
  Administered 2018-01-09: 1.5 mL

## 2018-01-09 MED ORDER — HYALURONAN 88 MG/4ML IX SOSY
88.0000 mg | PREFILLED_SYRINGE | INTRA_ARTICULAR | Status: AC | PRN
  Administered 2018-01-09: 88 mg via INTRA_ARTICULAR

## 2018-01-09 NOTE — Progress Notes (Signed)
   Procedure Note  Patient: Felicia [name]             Date of Birth: Jan 09, 1941           MRN: [id number]             Visit Date: 01/09/2018  Procedures: Visit Diagnoses: Primary osteoarthritis of both knees  Monovisc #1 Bilateral knee joints   Large Joint Inj: bilateral knee on 01/09/2018 10:49 AM Indications: pain Details: 27 G 1.5 in needle, medial approach  Arthrogram: No  Medications (Right): 88 mg Hyaluronan 88 MG/4ML; 1.5 mL lidocaine 1 % Aspirate (Right): 0 mL Medications (Left): 88 mg Hyaluronan 88 MG/4ML; 1.5 mL lidocaine 1 % Aspirate (Left): 0 mL Outcome: tolerated well, no immediate complications Procedure, treatment alternatives, risks and benefits explained, specific risks discussed. Consent was given by the patient. Immediately prior to procedure a time out was called to verify the correct patient, procedure, equipment, support staff and site/side marked as required. Patient was prepped and draped in the usual sterile fashion.     Patient tolerated the procedure well.   Hazel Sams, PA-C

## 2018-01-12 ENCOUNTER — Telehealth: Payer: Self-pay | Admitting: Rheumatology

## 2018-01-12 ENCOUNTER — Telehealth: Payer: Self-pay | Admitting: Physician Assistant

## 2018-01-12 NOTE — Telephone Encounter (Signed)
I attempted to call to see how her knee joints are feeling following bilateral monovisc injections on 01/09/18.

## 2018-01-12 NOTE — Telephone Encounter (Signed)
I returned the patient's call.  She has noticed significant benefit in both knee joints.  She has no joint pain or joint swelling at this time. She is looking forward to walking more frequently.  She has a follow up visit on 02/20/18.

## 2018-01-12 NOTE — Telephone Encounter (Signed)
Patient called stating she was returning [place] call regarding her injections.  Patient states her knees started feeling better by Sunday 01/11/18 and now "feel great."

## 2018-02-06 NOTE — Progress Notes (Signed)
Office Visit Note  Patient: Felicia [name]             Date of Birth: Dec 29, 1940           MRN: [id number]             PCP: Kathyrn Lass, MD Referring: Kathyrn Lass, MD Visit Date: 02/20/2018 Occupation: @GUAROCC @  Subjective:  Pain in multiple joints   History of Present Illness: Felicia [name] is a 77 y.o. female with history of seropositive rheumatoid arthritis, DDD, and osteoarthritis.  She is on Orenica sq injections once a month, MTX 0.8 mL sq once a week, and folic acid 2 mg PO daily.  She has been having increased pain in multiple joints for the past several months.  She says she has been having severe pain in her bilateral wrists and bilateral hands.  She is also having increased discomfort in bilateral feet.  She states that she has intermittent left breast swelling.  She says she has been having increased stiffness in multiple joints as well.  She states that she saw Dr. Doran Durand to discuss possibly having a bunionectomy in the future he mentioned that she may have idiopathic neuropathy as well.  She has been having issues with balance and some instability is worried that she is going to fall.  She is most concerned today about increased pain in multiple joints.  She continues to have chronic pain in both knee joints.  She does not feel as though the gel Visco injections provided much relief.  She continues to walk on a regular basis.   Activities of Daily Living:  Patient reports morning stiffness for 2-3  hours.   Patient Reports nocturnal pain.  Difficulty dressing/grooming: Denies Difficulty climbing stairs: Reports Difficulty getting out of chair: Reports Difficulty using hands for taps, buttons, cutlery, and/or writing: Reports  Review of Systems  Constitutional: Negative for fatigue.  HENT: Positive for mouth dryness. Negative for mouth sores and nose dryness.   Eyes: Positive for dryness. Negative for pain and visual disturbance.  Respiratory: Negative for cough,  hemoptysis, shortness of breath and difficulty breathing.   Cardiovascular: Negative for chest pain, palpitations, hypertension and swelling in legs/feet.  Gastrointestinal: Positive for constipation. Negative for blood in stool and diarrhea.  Endocrine: Negative for increased urination.  Genitourinary: Negative for painful urination.  Musculoskeletal: Positive for arthralgias, joint pain, joint swelling and morning stiffness. Negative for myalgias, muscle weakness, muscle tenderness and myalgias.  Skin: Negative for color change, pallor, rash, hair loss, nodules/bumps, skin tightness, ulcers and sensitivity to sunlight.  Allergic/Immunologic: Negative for susceptible to infections.  Neurological: Positive for numbness (Both feet). Negative for dizziness, headaches and weakness.  Hematological: Negative for swollen glands.  Psychiatric/Behavioral: Positive for sleep disturbance. Negative for depressed mood. The patient is not nervous/anxious.     PMFS History:  Patient Active Problem List   Diagnosis Date Noted  . URI (upper respiratory infection) 04/14/2017  . Primary osteoarthritis of both knees 03/06/2017  . Exertional chest pain   . Pain in the chest 03/27/2015  . Postural hypotension 05/26/2013  . OSA (obstructive sleep apnea) 03/22/2013  . Osteoarthritis 11/07/2011  . Abdominal bruit 10/16/2011  . HTN (hypertension) 08/13/2011  . Anisocoria 08/13/2011  . Cervical radiculopathy 07/17/2011  . DDD (degenerative disc disease), cervical 07/10/2011  . Glaucoma 06/06/2011  . HIATAL HERNIA 01/30/2010  . POSTMENOPAUSAL SYNDROME 12/14/2009  . EUSTACHIAN TUBE DYSFUNCTION, RIGHT 06/28/2009  . GERD 12/07/2008  . Rheumatoid arthritis ([place]) 12/07/2008  .  COLONIC POLYPS, HX OF 12/07/2008  . HYPERLIPIDEMIA 10/17/2006  . HYPERCALCEMIA 10/17/2006  . MITRAL VALVE PROLAPSE 10/17/2006    Past Medical History:  Diagnosis Date  . Adenomatous colon polyp 2004   Colo in 2009 "no polyps"  .  Complication of anesthesia    small airway per patient  . GERD (gastroesophageal reflux disease)   . Glaucoma   . Hiatal hernia   . Hypertension   . Rheumatoid arthritis(714.0)    Orencia; Methotrexate (Dr. Patrecia Pour)  . Sleep apnea    on CPAP    Family History  Problem Relation Age of Onset  . Dementia Mother   . Lung disease Mother        bronchiectasis  . Heart disease Mother        Aortic Stenosis  . Osteoporosis Mother   . Heart attack Father 12       S/P CBAG  . Stroke Maternal Grandmother 83  . Ulcerative colitis Brother   . Diabetes Neg Hx   . Cancer Neg Hx    Past Surgical History:  Procedure Laterality Date  . CARDIAC CATHETERIZATION N/A 03/29/2015   Procedure: Left Heart Cath and Coronary Angiography;  Surgeon: Belva Crome, MD;  Location: [facility];  Service: Cardiovascular;  Laterality: N/A;  . CATARACT EXTRACTION, BILATERAL    . COLONOSCOPY W/ POLYPECTOMY     X1; negative subsequently  . DILATION AND CURETTAGE OF UTERUS    . EYE SURGERY     due to RA  . EYE SURGERY Left 06/2017   eyelid tendon   . HYSTEROSCOPY W/D&C  06/24/2012   Procedure: DILATATION AND CURETTAGE /HYSTEROSCOPY;  Surgeon: [name], MD;  Location: [facility];  Service: Gynecology;  Laterality: N/A;  . SHOULDER SURGERY Left    due to RA  . TRABECULECTOMY     X 2 OD; X 1 OS  . UPPER GASTROINTESTINAL ENDOSCOPY     hiatal hernia   Social History   Social History Narrative  . Not on file    Objective: Vital Signs: BP 134/76 (BP Location: Left Arm, Patient Position: Sitting, Cuff Size: Normal)   Pulse 60   Resp 11   Ht 5\' 6"  (1.676 m)   Wt 133 lb 11.2 oz (60.6 kg)   BMI 21.58 kg/m    Physical Exam  Constitutional: She is oriented to person, place, and time. She appears well-developed and well-nourished.  HENT:  Head: Normocephalic and atraumatic.  Eyes: Conjunctivae and EOM are normal.  Neck: Normal range of motion.  Cardiovascular: Normal rate, regular  rhythm, normal heart sounds and intact distal pulses.  Pulmonary/Chest: Effort normal and breath sounds normal.  Abdominal: Soft. Bowel sounds are normal.  Lymphadenopathy:    She has no cervical adenopathy.  Neurological: She is alert and oriented to person, place, and time.  Skin: Skin is warm and dry. Capillary refill takes less than 2 seconds.  Psychiatric: She has a normal mood and affect. Her behavior is normal.  Nursing note and vitals reviewed.    Musculoskeletal Exam: C-spine, thoracic spine, and lumbar spine good ROM.  No midline spinal tenderness.  No SI joint tenderness.  Shoulder joints, elbow joints, wrist joints, MCPs, PIPs, and DIPs good ROM with no synovitis.  PIP and DIP synovial thickening.  Hip joints, knee joints, ankle joints, MTPs, PIPs, and DIPs good ROM.  No warmth or effusion of knee joints.  No tenderness or swelling of ankle joints.  No tenderness of trochanteric  bursa bilaterally.  Pedal edema bilaterally.    CDAI Exam: CDAI Score: 1.1  Patient Global Assessment: 8 (mm); Provider Global Assessment: 3 (mm) Swollen: 0 ; Tender: 0  Joint Exam   Not documented   There is currently no information documented on the homunculus. Go to the Rheumatology activity and complete the homunculus joint exam.  Investigation: No additional findings.  Imaging: No results found.  Recent Labs: Lab Results  Component Value Date   WBC 7.5 02/12/2018   HGB 11.6 (L) 02/12/2018   PLT 412 (H) 02/12/2018   NA 131 (L) 02/12/2018   K 5.0 02/12/2018   CL 101 02/12/2018   CO2 23 02/12/2018   GLUCOSE 86 02/12/2018   BUN 19 02/12/2018   CREATININE 0.87 02/12/2018   BILITOT 0.4 02/12/2018   ALKPHOS 71 11/07/2016   AST 20 02/12/2018   ALT 13 02/12/2018   PROT 6.2 02/12/2018   ALBUMIN 4.1 11/07/2016   CALCIUM 9.7 02/12/2018   GFRAA 75 02/12/2018   QFTBGOLDPLUS NEGATIVE 11/07/2017    Speciality Comments: TB gold neg 11/07/17  Procedures:  No procedures  performed Allergies: Sulfonamide derivatives; Amlodipine; Zostavax [zoster vaccine live]; and Norvasc [amlodipine besylate]   Assessment / Plan:     Visit Diagnoses: Rheumatoid arthritis involving multiple sites with positive rheumatoid factor (HCC) - RF+, ANA +: She has no synovitis on exam.  Her rheumatoid arthritis seems to be very well controlled on Orencia subcutaneous injections once monthly, methotrexate 0.8 mL subcutaneously once weekly and folic acid 2 mg daily.  She has been having increased polyarthralgias which is likely due to osteoarthritis.  We discussed that her disease is well controlled and we will not make any medication changes at this time.  We discussed joint protection and muscle strengthening to help with osteoarthritis.  She takes Tylenol PRN for pain relief.  She also uses Aspercreme topically.  She was advised to let us know if she develops increased joint swelling.  She will follow-up in the office in 5 months.  High risk medication use - Orencia SQ q month, MTX 0.8 ml sq q wk, folic acid 2 mg po qd(Inadequate response to Humira, Enbrel).  CBC and CMP were drawn on 02/12/2018.  Labs were stable at that time.  TB gold was negative on 11/07/2017.  She will return in December and every 3 months for lab work.  Primary osteoarthritis of both hands: She has PIP and DIP synovial thickening consistent with osteoarthritis of bilateral hands.  She is complete fist formation bilaterally.  No synovitis was noted.  She is been having increased discomfort in bilateral hands and bilateral wrists which is likely due to osteoarthritis.  Joint protection and muscle strengthening were discussed.  She Aspercreme topically to help with pain relief.  She could not tolerate Voltaren gel in the past.  Primary osteoarthritis of both knees: Chronic pain.  She does not feel as though the Visco supplement injections improved her symptoms.  She continues to walk on a regular basis.  No warmth or effusion were  noted on exam today.  DDD (degenerative disc disease), cervical: She is good range of motion with no discomfort.  No symptoms of cervical radiculopathy at this time.  DDD (degenerative disc disease), lumbar: No midline spinal tenderness.  She is no increased discomfort at this time.  Neuropathy: She was told by Dr. Doran Durand that she possibly has idiopathic neuropathy in bilateral lower extremities.  She has been having his numbness in both feet.  She is  also been experiencing a burning sensation in bilateral lower extremities.  Will place referral to neurology for further evaluation.  She has been having increased issues with instability and balance and is worried that she will fall if her symptoms progress.    Other medical conditions are listed as follows:   SIADH (syndrome of inappropriate ADH production) ([place])  History of hyperlipidemia  History of sleep apnea  History of glaucoma  History of colonic polyps  History of gastroesophageal reflux (GERD)   Orders: No orders of the defined types were placed in this encounter.  No orders of the defined types were placed in this encounter.   Face-to-face time spent with patient was 40 minutes. Greater than 50% of time was spent in counseling and coordination of care.  Follow-Up Instructions: Return in about 5 months (around 07/23/2018) for Rheumatoid arthritis, Osteoarthritis, DDD.   Ofilia Neas, PA-C   I examined and evaluated the patient with Hazel Sams PA.  Patient had multiple arthralgias today.  She has synovial thickening and osteoarthritis.  No active synovitis was noted.  She is interested in getting bilateral foot reconstruction.  She had hard time in the past when she had surgeries because of severe flare and complications from prednisone and glaucoma flare.  I advised that she can avoid surgery that will be best for her.  Eventually is going to be her decision.  The plan of care was discussed as noted above.  Bo Merino, MD  Note - This record has been created using Editor, commissioning.  Chart creation errors have been sought, but may not always  have been located. Such creation errors do not reflect on  the standard of medical care.

## 2018-02-12 ENCOUNTER — Other Ambulatory Visit: Payer: Self-pay | Admitting: Rheumatology

## 2018-02-12 ENCOUNTER — Other Ambulatory Visit: Payer: Self-pay

## 2018-02-12 DIAGNOSIS — Z79899 Other long term (current) drug therapy: Secondary | ICD-10-CM

## 2018-02-12 NOTE — Telephone Encounter (Signed)
Last visit: 09/18/2017 Next visit: 02/20/2018 Labs: 11/07/2017 stable.  Tb Gold: 11/07/17 Neg   Patient advised she is due for labs. Will update labs this week.   Okay to refill per Dr. Estanislado Pandy.

## 2018-02-13 LAB — CBC WITH DIFFERENTIAL/PLATELET
Basophils Absolute: 68 cells/uL (ref 0–200)
Basophils Relative: 0.9 %
Eosinophils Absolute: 90 cells/uL (ref 15–500)
Eosinophils Relative: 1.2 %
HCT: 34.6 % — ABNORMAL LOW (ref 35.0–45.0)
Hemoglobin: 11.6 g/dL — ABNORMAL LOW (ref 11.7–15.5)
Lymphs Abs: 1868 cells/uL (ref 850–3900)
MCH: 30.9 pg (ref 27.0–33.0)
MCHC: 33.5 g/dL (ref 32.0–36.0)
MCV: 92.3 fL (ref 80.0–100.0)
MPV: 9.7 fL (ref 7.5–12.5)
Monocytes Relative: 8.8 %
Neutro Abs: 4815 cells/uL (ref 1500–7800)
Neutrophils Relative %: 64.2 %
Platelets: 412 10*3/uL — ABNORMAL HIGH (ref 140–400)
RBC: 3.75 10*6/uL — ABNORMAL LOW (ref 3.80–5.10)
RDW: 13.3 % (ref 11.0–15.0)
Total Lymphocyte: 24.9 %
WBC mixed population: 660 cells/uL (ref 200–950)
WBC: 7.5 10*3/uL (ref 3.8–10.8)

## 2018-02-13 LAB — COMPLETE METABOLIC PANEL WITH GFR
AG Ratio: 1.6 (calc) (ref 1.0–2.5)
ALT: 13 U/L (ref 6–29)
AST: 20 U/L (ref 10–35)
Albumin: 3.8 g/dL (ref 3.6–5.1)
Alkaline phosphatase (APISO): 61 U/L (ref 33–130)
BUN: 19 mg/dL (ref 7–25)
CO2: 23 mmol/L (ref 20–32)
Calcium: 9.7 mg/dL (ref 8.6–10.4)
Chloride: 101 mmol/L (ref 98–110)
Creat: 0.87 mg/dL (ref 0.60–0.93)
GFR, Est African American: 75 mL/min/{1.73_m2} (ref >=60)
GFR, Est Non African American: 65 mL/min/{1.73_m2} (ref >=60)
Globulin: 2.4 g/dL (calc) (ref 1.9–3.7)
Glucose, Bld: 86 mg/dL (ref 65–99)
Potassium: 5 mmol/L (ref 3.5–5.3)
Sodium: 131 mmol/L — ABNORMAL LOW (ref 135–146)
Total Bilirubin: 0.4 mg/dL (ref 0.2–1.2)
Total Protein: 6.2 g/dL (ref 6.1–8.1)

## 2018-02-13 NOTE — Progress Notes (Signed)
Labs are stable.

## 2018-02-20 ENCOUNTER — Ambulatory Visit: Payer: Medicare Other | Admitting: Rheumatology

## 2018-02-20 ENCOUNTER — Encounter: Payer: Self-pay | Admitting: Rheumatology

## 2018-02-20 VITALS — BP 134/76 | HR 60 | Resp 11 | Ht 66.0 in | Wt 133.7 lb

## 2018-02-20 DIAGNOSIS — Z860100 Personal history of colon polyps, unspecified: Secondary | ICD-10-CM

## 2018-02-20 DIAGNOSIS — Z8719 Personal history of other diseases of the digestive system: Secondary | ICD-10-CM

## 2018-02-20 DIAGNOSIS — M170 Bilateral primary osteoarthritis of knee: Secondary | ICD-10-CM

## 2018-02-20 DIAGNOSIS — M19041 Primary osteoarthritis, right hand: Secondary | ICD-10-CM

## 2018-02-20 DIAGNOSIS — Z79899 Other long term (current) drug therapy: Secondary | ICD-10-CM

## 2018-02-20 DIAGNOSIS — E222 Syndrome of inappropriate secretion of antidiuretic hormone: Secondary | ICD-10-CM

## 2018-02-20 DIAGNOSIS — M0579 Rheumatoid arthritis with rheumatoid factor of multiple sites without organ or systems involvement: Secondary | ICD-10-CM

## 2018-02-20 DIAGNOSIS — Z8669 Personal history of other diseases of the nervous system and sense organs: Secondary | ICD-10-CM

## 2018-02-20 DIAGNOSIS — M5030 Other cervical disc degeneration, unspecified cervical region: Secondary | ICD-10-CM

## 2018-02-20 DIAGNOSIS — Z8639 Personal history of other endocrine, nutritional and metabolic disease: Secondary | ICD-10-CM

## 2018-02-20 DIAGNOSIS — M19042 Primary osteoarthritis, left hand: Secondary | ICD-10-CM

## 2018-02-20 DIAGNOSIS — M5136 Other intervertebral disc degeneration, lumbar region: Secondary | ICD-10-CM

## 2018-02-20 DIAGNOSIS — M51369 Other intervertebral disc degeneration, lumbar region without mention of lumbar back pain or lower extremity pain: Secondary | ICD-10-CM

## 2018-02-20 DIAGNOSIS — G629 Polyneuropathy, unspecified: Secondary | ICD-10-CM

## 2018-02-20 NOTE — Patient Instructions (Signed)
Standing Labs We placed an order today for your standing lab work.    Please come back and get your standing labs in December and every 3 months   We have open lab Monday through Friday from 8:30-11:30 AM and 1:30-4:00 PM  at the office of Dr. Shaili Deveshwar.   You may experience shorter wait times on Monday and Friday afternoons. The office is located at [address], [address], [place], [place] [address] No appointment is necessary.   Labs are drawn by [facility].  You may receive a bill from [facility] for your lab work. If you have any questions regarding directions or hours of operation,  please call [phone].     

## 2018-02-25 ENCOUNTER — Telehealth: Payer: Self-pay | Admitting: Diagnostic Neuroimaging

## 2018-02-25 ENCOUNTER — Ambulatory Visit: Payer: Medicare Other | Admitting: Diagnostic Neuroimaging

## 2018-02-25 ENCOUNTER — Encounter: Payer: Self-pay | Admitting: Diagnostic Neuroimaging

## 2018-02-25 ENCOUNTER — Other Ambulatory Visit: Payer: Self-pay | Admitting: *Deleted

## 2018-02-25 VITALS — BP 122/66 | HR 62 | Ht 66.0 in | Wt 137.0 lb

## 2018-02-25 DIAGNOSIS — R292 Abnormal reflex: Secondary | ICD-10-CM

## 2018-02-25 DIAGNOSIS — G629 Polyneuropathy, unspecified: Secondary | ICD-10-CM

## 2018-02-25 DIAGNOSIS — R200 Anesthesia of skin: Secondary | ICD-10-CM

## 2018-02-25 MED ORDER — ALPRAZOLAM 0.5 MG PO TABS: 0.5000 mg | ORAL_TABLET | ORAL | 0 refills | Status: DC | PRN

## 2018-02-25 MED ORDER — ABATACEPT 125 MG/ML ~~LOC~~ SOSY: PREFILLED_SYRINGE | SUBCUTANEOUS | 0 refills | Status: DC

## 2018-02-25 MED ORDER — GABAPENTIN 100 MG PO CAPS: 100.0000 mg | ORAL_CAPSULE | Freq: Every day | ORAL | 6 refills | Status: DC

## 2018-02-25 NOTE — Patient Instructions (Signed)
-   check neuropathy labs  - check EMG/NCS (nerve testing)  - check MRI cervical spine   - start gabapentin 100mg  at bedtime numbness / pain; may increase up to 300mg  at bedtime

## 2018-02-25 NOTE — Telephone Encounter (Signed)
[employer] order sent to [facility]. No auth they will reach out to the pt to schedule.  °

## 2018-02-25 NOTE — Telephone Encounter (Signed)
Refill request received via fax  Last Visit: 02/20/18 Next Visit: 07/23/18 Labs: 02/12/18 stable TB Gold: 11/07/17 Neg   Okay to refill per Dr. Bufford Buttner to refill per Dr. Estanislado Pandy

## 2018-02-25 NOTE — Progress Notes (Signed)
[facility]  PATIENT: Felicia [name] DOB: 1941/04/17  REFERRING CLINICIAN: Quita Skye, T HISTORY FROM: patient  REASON FOR VISIT: new consult    HISTORICAL  CHIEF COMPLAINT:  Chief Complaint  Patient presents with  . New Patient (Initial Visit)    Rm 7, friend Roxy Manns.  . Peripheral Neuropathy    Hazel Sams, PA    HISTORY OF PRESENT ILLNESS:   77 year old female here for evaluation of neuropathy.  For past 14 years patient has had rheumatoid arthritis.  She has had significant pain in her toes and feet.  3 years ago she developed numbness in her toes and feet.  Over the past few years symptoms have progressed up to her shins and ankles.  She has intermittent numbness, burning, shooting pain.  She starting to feel some numbness in her fingers.  Balance is getting worse over time.   REVIEW OF SYSTEMS: Full 14 system review of systems performed and negative with exception of: Numbness weakness insomnia joint pain cramps swelling aching muscles ringing in ears fatigue.   ALLERGIES: Allergies  Allergen Reactions  . Sulfonamide Derivatives     Rash, redness  . Amlodipine Swelling  . Zostavax [Zoster Vaccine Live]     Unable to get due to RA, per patient   . Norvasc [Amlodipine Besylate]     edema    HOME MEDICATIONS: Outpatient Medications Prior to Visit  Medication Sig Dispense Refill  . Calcium Carbonate-Vitamin D (CALTRATE 600+D) 600-400 MG-UNIT per tablet Take 2 tablets by mouth daily.     . Carboxymethylcellulose Sodium (REFRESH OP) Apply 1 drop to eye daily as needed (FOR DRY EYES).     . Cholecalciferol (VITAMIN D3) 1000 UNITS CAPS Take 1 capsule by mouth daily.     Marland Kitchen esomeprazole (NEXIUM) 40 MG capsule Take 1 capsule (40 mg total) by mouth 2 (two) times daily before a meal. 180 capsule 3  . estradiol (ESTRACE) 1 MG tablet Take 1 mg by mouth daily.  0  . folic acid (FOLVITE) 1 MG tablet TAKE 2 TABLETS BY MOUTH EVERY DAY 180 tablet 0  . Krill  Oil Omega-3 300 MG CAPS Take 1 capsule by mouth daily.    Marland Kitchen latanoprost (XALATAN) 0.005 % ophthalmic solution Place 1 drop at bedtime into the right eye.     . losartan (COZAAR) 100 MG tablet Take 100 mg by mouth daily.    . methotrexate 50 MG/2ML injection inject 0.8 milliliters subcutaneously EVERY WEEK 10 mL 0  . Methotrexate Sodium (METHOTREXATE, PF,) 50 MG/2ML injection ADMINISTER 0.8 ML UNDER THE SKIN EVERY WEEK 10 mL 0  . metoprolol (LOPRESSOR) 50 MG tablet Take 50 mg by mouth daily.   0  . metroNIDAZOLE (METROGEL) 1 % gel Apply 1 application topically 2 (two) times daily.     . MULTIPLE VITAMIN PO Take by mouth.    Marland Kitchen NEEDLE, DISP, 27 G (BD DISP NEEDLES) 27G X 1/2" MISC To use with Methotrexate/ additional needles needed so patient can change needle before injecting to reduce pain from dull needle 12 each 3  . ORENCIA 125 MG/ML SOSY INJECT 125MG SUBCUTANEOUSLY WEEKLY 4 Syringe 0  . OVER THE COUNTER MEDICATION daily.    . progesterone (PROMETRIUM) 200 MG capsule Take 200 mg by mouth daily.    . Psyllium (METAMUCIL PO) 1 tbs by mouth once daily    . timolol (TIMOPTIC) 0.5 % ophthalmic solution Place 1 drop into both eyes daily.     . traMADol (  ULTRAM) 50 MG tablet Take 50 mg by mouth at bedtime as needed (pain). Reported on 06/14/2015    . TUBERCULIN SYR 1CC/27GX1/2" (B-D TB SYRINGE 1CC/27GX1/2") 27G X 1/2" 1 ML MISC To use with Methotrexate once weekly 12 each 3  . TURMERIC PO Take by mouth daily.    . Wheat Dextrin (BENEFIBER DRINK MIX PO) Take at bedtime by mouth.    . valACYclovir (VALTREX) 1000 MG tablet Take 1,000 mg by mouth as needed (fever blister). Reported on 06/14/2015    . aspirin 81 MG tablet Take 81 mg by mouth daily.     . Iron TABS Take 1 tablet by mouth every other day. Reported on 06/14/2015     No facility-administered medications prior to visit.     PAST MEDICAL HISTORY: Past Medical History:  Diagnosis Date  . Adenomatous colon polyp 2004   Colo in 2009 "no  polyps"  . Complication of anesthesia    small airway per patient  . GERD (gastroesophageal reflux disease)   . Glaucoma   . Hiatal hernia   . Hypertension   . Rheumatoid arthritis(714.0)    Orencia; Methotrexate (Dr. Patrecia Pour)  . Sleep apnea    on CPAP    PAST SURGICAL HISTORY: Past Surgical History:  Procedure Laterality Date  . CARDIAC CATHETERIZATION N/A 03/29/2015   Procedure: Left Heart Cath and Coronary Angiography;  Surgeon: Belva Crome, MD;  Location: [facility];  Service: Cardiovascular;  Laterality: N/A;  . CATARACT EXTRACTION, BILATERAL    . COLONOSCOPY W/ POLYPECTOMY     X1; negative subsequently  . DILATION AND CURETTAGE OF UTERUS    . EYE SURGERY     due to RA  . EYE SURGERY Left 06/2017   eyelid tendon   . HYSTEROSCOPY W/D&C  06/24/2012   Procedure: DILATATION AND CURETTAGE /HYSTEROSCOPY;  Surgeon: [name], MD;  Location: [facility];  Service: Gynecology;  Laterality: N/A;  . SHOULDER SURGERY Left    due to RA  . TRABECULECTOMY     X 2 OD; X 1 OS  . UPPER GASTROINTESTINAL ENDOSCOPY     hiatal hernia    FAMILY HISTORY: Family History  Problem Relation Age of Onset  . Dementia Mother   . Lung disease Mother        bronchiectasis  . Heart disease Mother        Aortic Stenosis  . Osteoporosis Mother   . Heart attack Father 56       S/P CBAG  . Stroke Maternal Grandmother 83  . Ulcerative colitis Brother   . Diabetes Neg Hx   . Cancer Neg Hx     SOCIAL HISTORY: Social History   Socioeconomic History  . Marital status: Single    Spouse name: Not on file  . Number of children: 0  . Years of education: Not on file  . Highest education level: Not on file  Occupational History  . 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 Financial resource strain: Not on file  . Food insecurity:    Worry: Not on file    Inability: Not on file  . Transportation  needs:    Medical: Not on file    Non-medical: Not on file  Tobacco Use  . Smoking status: Former Smoker    Packs/day: 0.50    Years: 5.00    Pack years: 2.50    Types: Cigarettes  Last attempt to quit: 06/04/1971    Years since quitting: 46.7  . Smokeless tobacco: Never Used  . Tobacco comment: smoked about 3-5 years , up to 1/2 ppd  Substance and Sexual Activity  . Alcohol use: No  . Drug use: No  . Sexual activity: Yes    Birth control/protection: Post-menopausal  Lifestyle  . Physical activity:    Days per week: Not on file    Minutes per session: Not on file  . Stress: Not on file  Relationships  . Social connections:    Talks on phone: Not on file    Gets together: Not on file    Attends religious service: Not on file    Active member of club or organization: Not on file    Attends meetings of clubs or organizations: Not on file    Relationship status: Not on file  . Intimate partner violence:    Fear of current or ex partner: Not on file    Emotionally abused: Not on file    Physically abused: Not on file    Forced sexual activity: Not on file  Other Topics Concern  . Not on file  Social History Narrative  . Not on file     PHYSICAL EXAM  GENERAL EXAM/CONSTITUTIONAL: Vitals:  Vitals:   02/25/18 0948  BP: 122/66  Pulse: 62  Weight: 137 lb (62.1 kg)  Height: '5\' 6"'  (1.676 m)     Body mass index is 22.11 kg/m. Wt Readings from Last 3 Encounters:  02/25/18 137 lb (62.1 kg)  02/20/18 133 lb 11.2 oz (60.6 kg)  09/18/17 137 lb (62.1 kg)     Patient is in no distress; well developed, nourished and groomed; neck is supple  CARDIOVASCULAR:  Examination of carotid arteries is normal; no carotid bruits  Regular rate and rhythm, no murmurs  Examination of peripheral vascular system by observation and palpation is normal  EYES:  Ophthalmoscopic exam of optic discs and posterior segments is normal; no papilledema or hemorrhages  No exam data  present  MUSCULOSKELETAL:  Gait, strength, tone, movements noted in Neurologic exam below  NEUROLOGIC: MENTAL STATUS:  No flowsheet data found.  awake, alert, oriented to person, place and time  recent and remote memory intact  normal attention and concentration  language fluent, comprehension intact, naming intact  fund of knowledge appropriate  CRANIAL NERVE:   2nd - no papilledema on fundoscopic exam  2nd, 3rd, 4th, 6th - pupils equal and reactive to light, visual fields full to confrontation, extraocular muscles intact, no nystagmus  5th - facial sensation symmetric  7th - facial strength symmetric  8th - hearing intact  9th - palate elevates symmetrically, uvula midline  11th - shoulder shrug symmetric  12th - tongue protrusion midline  MOTOR:   normal bulk and tone, full strength in the BUE, BLE  SENSORY:   normal and symmetric to light touch; DECR PP, VIB, TEMP IN FEET  COORDINATION:   finger-nose-finger, fine finger movements normal  REFLEXES:   deep tendon reflexes BRISK and symmetric IN UPPER > LOWER EXT  GAIT/STATION:   narrow based gait; CAUTIOUS GAIT     DIAGNOSTIC DATA (LABS, IMAGING, TESTING) - I reviewed patient records, labs, notes, testing and imaging myself where available.  Lab Results  Component Value Date   WBC 7.5 02/12/2018   HGB 11.6 (L) 02/12/2018   HCT 34.6 (L) 02/12/2018   MCV 92.3 02/12/2018   PLT 412 (H) 02/12/2018  Component Value Date/Time   NA 131 (L) 02/12/2018 1454   K 5.0 02/12/2018 1454   CL 101 02/12/2018 1454   CO2 23 02/12/2018 1454   GLUCOSE 86 02/12/2018 1454   BUN 19 02/12/2018 1454   CREATININE 0.87 02/12/2018 1454   CALCIUM 9.7 02/12/2018 1454   PROT 6.2 02/12/2018 1454   ALBUMIN 4.1 11/07/2016 1201   AST 20 02/12/2018 1454   ALT 13 02/12/2018 1454   ALKPHOS 71 11/07/2016 1201   BILITOT 0.4 02/12/2018 1454   GFRNONAA 65 02/12/2018 1454   GFRAA 75 02/12/2018 1454   Lab Results    Component Value Date   CHOL 200 09/02/2012   HDL 69.70 09/02/2012   LDLCALC 110 (H) 09/02/2012   LDLDIRECT 160.4 12/14/2009   TRIG 104.0 09/02/2012   CHOLHDL 3 09/02/2012   Lab Results  Component Value Date   HGBA1C 5.6 11/07/2011   Lab Results  Component Value Date   FGBMSXJD55 208 04/07/2009   Lab Results  Component Value Date   TSH 1.02 12/07/2008    07/16/11 MRI cervical - Tiny soft disc protrusions at C7-T1 and T1-2 into the right lateral recesses which could affect the right C8 T1 nerves respectively.   08/05/11 CT head  - No evidence of acute intracranial abnormality. - Atrophy with small vessel ischemic changes.   ASSESSMENT AND PLAN  77 y.o. year old female here with rheumatoid arthritis, here for evaluation of neuropathy.  Ddx: neuropathy  (RA, metabolic, hereditary, idiopathic), cervical myelopathy, cervical radiculopathy, lumbar radiculopathy  1. Neuropathy   2. Hyperreflexia   3. Numbness     PLAN:  - check neuropathy labs - check EMG/NCS - check MRI cervical spine  - start gabapentin for symptom control; 127m at bedtime numbness / pain; may increase up to 3019mat bedtime  Orders Placed This Encounter  Procedures  . MR CERVICAL SPINE WO CONTRAST  . Hemoglobin A1c  . TSH  . Vitamin B12  . Multiple Myeloma Panel (SPEP&IFE w/QIG)  . Pan-ANCA  . Angiotensin converting enzyme  . NCV with EMG(electromyography)   Meds ordered this encounter  Medications  . gabapentin (NEURONTIN) 100 MG capsule    Sig: Take 1-3 capsules (100-300 mg total) by mouth at bedtime.    Dispense:  90 capsule    Refill:  6  . ALPRAZolam (XANAX) 0.5 MG tablet    Sig: Take 1 tablet (0.5 mg total) by mouth as needed for anxiety (for sedation before MRI scan; take 1 hour before scan; may repeat 15 min before scan).    Dispense:  3 tablet    Refill:  0   Return for for NCV/EMG.    VIPenni BombardMD 9/0/22/[id number] Certified in Neurology, Neurophysiology and  Neuroimaging  [facility] [id number] [phone]

## 2018-02-27 LAB — VITAMIN B12: Vitamin B-12: 1146 pg/mL (ref 232–1245)

## 2018-02-27 LAB — MULTIPLE MYELOMA PANEL, SERUM
Albumin SerPl Elph-Mcnc: 3.7 g/dL (ref 2.9–4.4)
Albumin/Glob SerPl: 1.4 (ref 0.7–1.7)
Alpha 1: 0.2 g/dL (ref 0.0–0.4)
Alpha2 Glob SerPl Elph-Mcnc: 0.8 g/dL (ref 0.4–1.0)
B-Globulin SerPl Elph-Mcnc: 1 g/dL (ref 0.7–1.3)
Gamma Glob SerPl Elph-Mcnc: 0.8 g/dL (ref 0.4–1.8)
Globulin, Total: 2.7 g/dL (ref 2.2–3.9)
IgA/Immunoglobulin A, Serum: 185 mg/dL (ref 64–422)
IgG (Immunoglobin G), Serum: 811 mg/dL (ref 700–1600)
IgM (Immunoglobulin M), Srm: 116 mg/dL (ref 26–217)
Total Protein: 6.4 g/dL (ref 6.0–8.5)

## 2018-02-27 LAB — PAN-ANCA
ANCA Proteinase 3: <3.5 U/mL (ref 0.0–3.5)
Atypical pANCA: <1:20 {titer}
C-ANCA: <1:20 {titer}
Myeloperoxidase Ab: <9 U/mL (ref 0.0–9.0)
P-ANCA: <1:20 {titer}

## 2018-02-27 LAB — TSH: TSH: 1.6 u[IU]/mL (ref 0.450–4.500)

## 2018-02-27 LAB — ANGIOTENSIN CONVERTING ENZYME: Angio Convert Enzyme: 67 U/L (ref 14–82)

## 2018-02-27 LAB — HEMOGLOBIN A1C
Est. average glucose Bld gHb Est-mCnc: 123 mg/dL
Hgb A1c MFr Bld: 5.9 % — ABNORMAL HIGH (ref 4.8–5.6)

## 2018-03-04 ENCOUNTER — Telehealth: Payer: Self-pay | Admitting: *Deleted

## 2018-03-04 NOTE — Telephone Encounter (Signed)
LVM informing patient that her lab results are unremarkable. Advised her following her MRI next week she'll get a call with results. Reminded her of NCS appt. Left number for any questions.

## 2018-03-09 ENCOUNTER — Other Ambulatory Visit: Payer: Self-pay | Admitting: Rheumatology

## 2018-03-09 NOTE — Telephone Encounter (Signed)
Last Visit: 02/20/18 Next Visit: 07/23/18 Labs: 02/12/18 stable  Okay to refill per Dr. Estanislado Pandy

## 2018-03-10 ENCOUNTER — Ambulatory Visit
Admission: RE | Admit: 2018-03-10 | Discharge: 2018-03-10 | Disposition: A | Discharge status: Home / Self Care | Payer: Medicare Other | Source: Ambulatory Visit | Attending: Diagnostic Neuroimaging | Admitting: Diagnostic Neuroimaging

## 2018-03-10 DIAGNOSIS — R292 Abnormal reflex: Secondary | ICD-10-CM

## 2018-03-10 DIAGNOSIS — R200 Anesthesia of skin: Secondary | ICD-10-CM

## 2018-03-12 ENCOUNTER — Telehealth: Payer: Self-pay | Admitting: *Deleted

## 2018-03-12 NOTE — Telephone Encounter (Signed)
Spoke to pt and relayed Mild-moderate spinal stenosis at C4-5 and C5-6. Consider neurosurgery consult vs conservative mgmt. I offered if she would like phone call to discuss and she would like to wait when in for [place]/EMG on 03/26/18.

## 2018-03-12 NOTE — Telephone Encounter (Signed)
-----   Message from Penni Bombard, MD sent at 03/11/2018  5:56 PM EDT ----- Mild-moderate spinal stenosis at C4-5 and C5-6. Consider neurosurgery consult vs conservative mgmt. Please call patient. I can discuss further by phone with patient. -VRP

## 2018-03-12 NOTE — Telephone Encounter (Signed)
LMVM for pt to return call.   

## 2018-03-20 ENCOUNTER — Telehealth: Payer: Self-pay | Admitting: *Deleted

## 2018-03-20 ENCOUNTER — Telehealth: Payer: Self-pay | Admitting: Rheumatology

## 2018-03-20 NOTE — Telephone Encounter (Signed)
Patient called stating she was returning your call.   °

## 2018-03-20 NOTE — Telephone Encounter (Signed)
Received fax from [facility]. They have attempted to contact patient several times to fill Orencia PFS. Unable to reach patient attempted to contact patient and left message for patient to call the office.

## 2018-03-20 NOTE — Telephone Encounter (Signed)
Patient advised Felicia [name] has faxed stating they have made several attempts to contact her for fill of her Orencia. Patient states she has not received a phone call from them but she will reach out to them.

## 2018-03-26 ENCOUNTER — Encounter: Payer: Medicare Other | Admitting: Diagnostic Neuroimaging

## 2018-03-26 ENCOUNTER — Other Ambulatory Visit: Payer: Self-pay | Admitting: Rheumatology

## 2018-03-26 ENCOUNTER — Ambulatory Visit (INDEPENDENT_AMBULATORY_CARE_PROVIDER_SITE_OTHER): Payer: Medicare Other | Admitting: Diagnostic Neuroimaging

## 2018-03-26 DIAGNOSIS — Z0289 Encounter for other administrative examinations: Secondary | ICD-10-CM

## 2018-03-26 DIAGNOSIS — G629 Polyneuropathy, unspecified: Secondary | ICD-10-CM

## 2018-03-26 DIAGNOSIS — R200 Anesthesia of skin: Secondary | ICD-10-CM

## 2018-03-26 NOTE — Telephone Encounter (Signed)
Last visit: 02/20/2018 Next visit: 07/23/2018  Okay to refill per Dr. Deveshwar 

## 2018-03-30 NOTE — Procedures (Signed)
[facility]  NCS (NERVE CONDUCTION STUDY) WITH EMG (ELECTROMYOGRAPHY) REPORT   STUDY DATE: 03/26/18 PATIENT NAME: Felicia [name] DOB: January 13, 1941 MRN: [id number]  ORDERING CLINICIAN: Andrey Spearman, MD   TECHNOLOGIST: Oneita Jolly ELECTROMYOGRAPHER: Earlean Polka. Cashawn Yanko, MD  CLINICAL INFORMATION: 77 year old female with upper and lower extremity numbness and tingling.  FINDINGS: NERVE CONDUCTION STUDY:  Bilateral median, right ulnar, bilateral peroneal and left tibial motor responses are normal.  Right tibial motor response is prolonged distal latency, normal amplitude, mildly slow conduction velocity.  Right superficial peroneal, left sural, right median sensory responses have prolonged peak latencies and decreased amplitude.  Right sural, left superficial peroneal sensory responses have decreased amplitudes.  Left median and right ulnar sensory responses are normal.  Right tibial F wave latency is slightly prolonged.  Left tibial and right ulnar F-wave latencies are normal.   NEEDLE ELECTROMYOGRAPHY:  Needle examination of right vastus medialis, tibials anterior, gastric anemias is normal.    IMPRESSION:   Abnormal study demonstrating: - Axonal sensorimotor polyneuropathy.    INTERPRETING PHYSICIAN:  Penni Bombard, MD Certified in Neurology, Neurophysiology and Neuroimaging  [facility] [address] [place], [place] [address] [phone]   [facility]    Nerve / Sites Muscle Latency Ref. Amplitude Ref. Rel Amp Segments Distance Velocity Ref. Area    ms ms mV mV %  cm m/s m/s mVms  R Median - APB     Wrist APB 3.8 ?4.4 4.3 ?4.0 100 Wrist - APB 7   13.5     Upper arm APB 8.3  3.7  87.3 Upper arm - Wrist 23 51 ?49 13.1  L Median - APB     Wrist APB 3.7 ?4.4 3.8 ?4.0 100 Wrist - APB 7   12.2     Upper arm APB 8.2  2.5  67.4 Upper arm - Wrist 22 49 ?49 7.7  R Ulnar - ADM     Wrist ADM 2.7 ?3.3 8.1 ?6.0 100  Wrist - ADM 7   20.4     B.Elbow ADM 6.1  7.9  96.3 B.Elbow - Wrist 19 55 ?49 24.0     A.Elbow ADM 8.1  8.0  102 A.Elbow - B.Elbow 10 51 ?49 25.1         A.Elbow - Wrist      R Peroneal - EDB     Ankle EDB 4.8 ?6.5 3.1 ?2.0 100 Ankle - EDB 9   8.5     Fib head EDB 11.9  3.1  99.5 Fib head - Ankle 31 44 ?44 8.2     Pop fossa EDB 14.2  3.1  98.5 Pop fossa - Fib head 10 44 ?44 8.4         Pop fossa - Ankle      L Peroneal - EDB     Ankle EDB 4.5 ?6.5 2.5 ?2.0 100 Ankle - EDB 9   8.1     Fib head EDB 11.4  2.1  84.1 Fib head - Ankle 31 45 ?44 7.1     Pop fossa EDB 13.6  2.3  111 Pop fossa - Fib head 10 45 ?44 8.7         Pop fossa - Ankle      R Tibial - AH     Ankle AH 6.2 ?5.8 3.5 ?4.0 100 Ankle - AH 9   8.9     Pop fossa AH 15.6  2.1  58.3 Pop  fossa - Ankle 36 38 ?41 6.5  L Tibial - AH     Ankle AH 5.4 ?5.8 4.2 ?4.0 100 Ankle - AH 9   10.3     Pop fossa AH 13.6  2.9  68.8 Pop fossa - Ankle 36 43 ?41 9.8                   SNC    Nerve / Sites Rec. Site Peak Lat Ref.  Amp Ref. Segments Distance    ms ms V V  cm  R Superficial peroneal - Ankle (Calf)     Calf Ankle 4.4 ?4.4 2 ?6 Calf - Ankle 14  L Sural - Ankle (Calf)     Calf Ankle 4.6 ?4.4 2 ?6 Calf - Ankle 14  R Sural - Ankle (Calf)     Calf Ankle 4.0 ?4.4 4 ?6 Calf - Ankle 14  L Superficial peroneal - Ankle     Lat leg Ankle 4.3 ?4.4 2 ?6 Lat leg - Ankle 14  R Median - Orthodromic (Dig II, Mid palm)     Dig II Wrist 4.0 ?3.4 4 ?10 Dig II - Wrist 13  L Median - Orthodromic (Dig II, Mid palm)     Dig II Wrist 3.4 ?3.4 10 ?10 Dig II - Wrist 13  R Ulnar - Orthodromic, (Dig V, Mid palm)     Dig V Wrist 2.8 ?3.1 5 ?5 Dig V - Wrist 14                   F  Wave    Nerve F Lat Ref.   ms ms  R Tibial - AH 59.0 ?56.0  L Tibial - AH 51.3 ?56.0  R Ulnar - ADM 28.9 ?32.0           EMG full       EMG Summary Table    Spontaneous MUAP Recruitment  Muscle IA Fib PSW Fasc Other Amp Dur. Poly Pattern  R. Vastus medialis Normal  None None None _______ Normal Normal Normal Normal  R. Tibialis anterior Normal None None None _______ Normal Normal Normal Normal  R. Gastrocnemius (Medial head) Normal None None None _______ Normal Normal Normal Normal

## 2018-05-12 ENCOUNTER — Other Ambulatory Visit: Payer: Self-pay

## 2018-05-12 DIAGNOSIS — Z79899 Other long term (current) drug therapy: Secondary | ICD-10-CM

## 2018-05-13 LAB — CBC WITH DIFFERENTIAL/PLATELET
Basophils Absolute: 71 cells/uL (ref 0–200)
Basophils Relative: 0.8 %
Eosinophils Absolute: 169 cells/uL (ref 15–500)
Eosinophils Relative: 1.9 %
HCT: 35.8 % (ref 35.0–45.0)
Hemoglobin: 12.2 g/dL (ref 11.7–15.5)
Lymphs Abs: 2590 cells/uL (ref 850–3900)
MCH: 31.8 pg (ref 27.0–33.0)
MCHC: 34.1 g/dL (ref 32.0–36.0)
MCV: 93.2 fL (ref 80.0–100.0)
MPV: 10.1 fL (ref 7.5–12.5)
Monocytes Relative: 6.7 %
Neutro Abs: 5474 cells/uL (ref 1500–7800)
Neutrophils Relative %: 61.5 %
Platelets: 444 10*3/uL — ABNORMAL HIGH (ref 140–400)
RBC: 3.84 10*6/uL (ref 3.80–5.10)
RDW: 13.2 % (ref 11.0–15.0)
Total Lymphocyte: 29.1 %
WBC mixed population: 596 cells/uL (ref 200–950)
WBC: 8.9 10*3/uL (ref 3.8–10.8)

## 2018-05-13 LAB — COMPLETE METABOLIC PANEL WITH GFR
AG Ratio: 1.4 (calc) (ref 1.0–2.5)
ALT: 16 U/L (ref 6–29)
AST: 23 U/L (ref 10–35)
Albumin: 3.8 g/dL (ref 3.6–5.1)
Alkaline phosphatase (APISO): 71 U/L (ref 33–130)
BUN: 15 mg/dL (ref 7–25)
CO2: 26 mmol/L (ref 20–32)
Calcium: 10.3 mg/dL (ref 8.6–10.4)
Chloride: 101 mmol/L (ref 98–110)
Creat: 0.79 mg/dL (ref 0.60–0.93)
GFR, Est African American: 84 mL/min/{1.73_m2} (ref >=60)
GFR, Est Non African American: 73 mL/min/{1.73_m2} (ref >=60)
Globulin: 2.8 g/dL (calc) (ref 1.9–3.7)
Glucose, Bld: 100 mg/dL — ABNORMAL HIGH (ref 65–99)
Potassium: 5 mmol/L (ref 3.5–5.3)
Sodium: 134 mmol/L — ABNORMAL LOW (ref 135–146)
Total Bilirubin: 0.3 mg/dL (ref 0.2–1.2)
Total Protein: 6.6 g/dL (ref 6.1–8.1)

## 2018-05-13 NOTE — Progress Notes (Signed)
Labs are stable. Mild thrombocytosis noted. We will monitor.

## 2018-05-19 ENCOUNTER — Other Ambulatory Visit: Payer: Self-pay | Admitting: Rheumatology

## 2018-05-20 NOTE — Telephone Encounter (Signed)
Last visit: 02/20/2018 Next visit: 07/23/2018 Labs: 05/12/18 Labs are stable. Mild thrombocytosis noted  Okay to refill per Dr. Estanislado Pandy.

## 2018-05-22 ENCOUNTER — Other Ambulatory Visit: Payer: Self-pay | Admitting: Rheumatology

## 2018-05-22 NOTE — Telephone Encounter (Signed)
Last visit: 02/20/2018 Next visit: 07/23/2018 Labs: 05/12/18 Labs are stable. Mild thrombocytosis noted TB Gold: 11/07/17 Neg   Okay to refill per Dr. Estanislado Pandy

## 2018-06-02 ENCOUNTER — Telehealth: Payer: Self-pay | Admitting: *Deleted

## 2018-06-02 NOTE — Telephone Encounter (Signed)
Received fax from [facility] stating they have made multiple attempts to reach patient to fill Orencia. Unable to reach patient. Call back number [phone]

## 2018-06-08 NOTE — Telephone Encounter (Signed)
Patient advised Felicia [name] has been trying to reach her to fill Orencia. Patient states when they reached out she had 5- 6 syringes. Patient states she made them aware and advised them she would call when she is ready for refill.

## 2018-06-22 ENCOUNTER — Other Ambulatory Visit: Payer: Self-pay | Admitting: Rheumatology

## 2018-06-22 NOTE — Telephone Encounter (Signed)
Last visit: 02/20/2018 Next visit: 07/23/2018  Okay to refill per Dr. Estanislado Pandy

## 2018-07-09 NOTE — Progress Notes (Signed)
Office Visit Note  Patient: Felicia [name]             Date of Birth: [birth year]           MRN: [id number]             PCP: Kathyrn Lass, MD Referring: Kathyrn Lass, MD Visit Date: 07/23/2018 Occupation: @GUAROCC @  Subjective:  Pain in hands and feet.    History of Present Illness: Felicia [name] is a 78 y.o. female with history of rheumatoid arthritis, osteoarthritis and degenerative disc disease.  She states she continues to have pain and discomfort in her hands and feet.  She has not noticed the swelling.  She is uncertain if the pain is coming from arthritis or neuropathy.  She had C-spine MRI by Dr. Leta Baptist and also nerve conduction velocities which showed sensorimotor neuropathy.  She has a follow-up appointment coming up with Dr. Leta Baptist to discuss other treatment options.  She is a started gabapentin per his recommendations.  She has noticed some improvement in her pain on gabapentin at night.  Activities of Daily Living:  Patient reports morning stiffness for 1 hour.   Patient Reports nocturnal pain.  Difficulty dressing/grooming: Denies Difficulty climbing stairs: Reports Difficulty getting out of chair: Denies Difficulty using hands for taps, buttons, cutlery, and/or writing: Reports  Review of Systems  Constitutional: Positive for fatigue. Negative for night sweats, weight gain and weight loss.  HENT: Positive for mouth dryness. Negative for mouth sores, trouble swallowing, trouble swallowing and nose dryness.   Eyes: Positive for dryness. Negative for pain, redness and visual disturbance.  Respiratory: Positive for shortness of breath. Negative for cough and difficulty breathing.   Cardiovascular: Negative for chest pain, palpitations, hypertension, irregular heartbeat and swelling in legs/feet.  Gastrointestinal: Negative for blood in stool, constipation and diarrhea.  Endocrine: Positive for increased urination. Negative for excessive thirst.  Genitourinary: Negative  for involuntary urination and vaginal dryness.  Musculoskeletal: Positive for arthralgias, gait problem, joint pain, myalgias, morning stiffness and myalgias. Negative for joint swelling, muscle weakness and muscle tenderness.  Skin: Negative for color change, rash, hair loss, skin tightness, ulcers and sensitivity to sunlight.  Allergic/Immunologic: Negative for susceptible to infections.  Neurological: Positive for numbness. Negative for dizziness, memory loss, night sweats and weakness.  Hematological: Negative for bruising/bleeding tendency and swollen glands.  Psychiatric/Behavioral: Positive for sleep disturbance. Negative for depressed mood. The patient is not nervous/anxious.     PMFS History:  Patient Active Problem List   Diagnosis Date Noted  . URI (upper respiratory infection) 04/14/2017  . Primary osteoarthritis of both knees 03/06/2017  . Exertional chest pain   . Pain in the chest 03/27/2015  . Postural hypotension 05/26/2013  . OSA (obstructive sleep apnea) 03/22/2013  . Osteoarthritis 11/07/2011  . Abdominal bruit 10/16/2011  . HTN (hypertension) 08/13/2011  . Anisocoria 08/13/2011  . Cervical radiculopathy 07/17/2011  . DDD (degenerative disc disease), cervical 07/10/2011  . Glaucoma 06/06/2011  . HIATAL HERNIA 01/30/2010  . POSTMENOPAUSAL SYNDROME 12/14/2009  . EUSTACHIAN TUBE DYSFUNCTION, RIGHT 06/28/2009  . GERD 12/07/2008  . Rheumatoid arthritis ([place]) 12/07/2008  . COLONIC POLYPS, HX OF 12/07/2008  . HYPERLIPIDEMIA 10/17/2006  . HYPERCALCEMIA 10/17/2006  . MITRAL VALVE PROLAPSE 10/17/2006    Past Medical History:  Diagnosis Date  . Adenomatous colon polyp 2004   Colo in 2009 "no polyps"  . Complication of anesthesia    small airway per patient  . GERD (gastroesophageal reflux disease)   .  Glaucoma   . Hiatal hernia   . Hypertension   . Neuropathy   . Rheumatoid arthritis(714.0)    Orencia; Methotrexate (Dr. Patrecia Pour)  . Sleep apnea    on CPAP     Family History  Problem Relation Age of Onset  . Dementia Mother   . Lung disease Mother        bronchiectasis  . Heart disease Mother        Aortic Stenosis  . Osteoporosis Mother   . Heart attack Father 65       S/P CBAG  . Stroke Maternal Grandmother 83  . Ulcerative colitis Brother   . Diabetes Neg Hx   . Cancer Neg Hx    Past Surgical History:  Procedure Laterality Date  . CARDIAC CATHETERIZATION N/A 03/29/2015   Procedure: Left Heart Cath and Coronary Angiography;  Surgeon: Belva Crome, MD;  Location: [facility];  Service: Cardiovascular;  Laterality: N/A;  . CATARACT EXTRACTION, BILATERAL    . COLONOSCOPY W/ POLYPECTOMY     X1; negative subsequently  . DILATION AND CURETTAGE OF UTERUS    . EYE SURGERY     due to RA  . EYE SURGERY Left 06/2017   eyelid tendon   . HYSTEROSCOPY W/D&C  06/24/2012   Procedure: DILATATION AND CURETTAGE /HYSTEROSCOPY;  Surgeon: [name], MD;  Location: [facility];  Service: Gynecology;  Laterality: N/A;  . SHOULDER SURGERY Left    due to RA  . TRABECULECTOMY     X 2 OD; X 1 OS  . UPPER GASTROINTESTINAL ENDOSCOPY     hiatal hernia   Social History   Social History Narrative  . Not on file   Immunization History  Administered Date(s) Administered  . Influenza Split 03/08/2014  . Influenza Whole 04/07/2007, 02/24/2008, 03/10/2009, 03/03/2012  . Influenza, High Dose Seasonal PF 01/15/2016, 03/07/2017  . Influenza,inj,Quad PF,6+ Mos 03/15/2013, 02/21/2015, 03/17/2018  . PPD Test 03/10/2012  . Pneumococcal Conjugate-13 09/12/2015  . Pneumococcal Polysaccharide-23 03/25/2003, 04/12/2008  . Td 12/14/2009  . Zoster Recombinat (Shingrix) 03/23/2018     Objective: Vital Signs: BP (!) 141/63 (BP Location: Left Arm, Patient Position: Sitting, Cuff Size: Normal)   Pulse (!) 59   Resp 16   Ht 5' 5.5" (1.664 m)   Wt 134 lb (60.8 kg)   BMI 21.96 kg/m    Physical Exam Vitals signs and nursing note reviewed.    Constitutional:      Appearance: She is well-developed.  HENT:     Head: Normocephalic and atraumatic.  Eyes:     Conjunctiva/sclera: Conjunctivae normal.  Neck:     Musculoskeletal: Normal range of motion.  Cardiovascular:     Rate and Rhythm: Normal rate and regular rhythm.     Heart sounds: Normal heart sounds.  Pulmonary:     Effort: Pulmonary effort is normal.     Breath sounds: Normal breath sounds.  Abdominal:     General: Bowel sounds are normal.     Palpations: Abdomen is soft.  Lymphadenopathy:     Cervical: No cervical adenopathy.  Skin:    General: Skin is warm and dry.     Capillary Refill: Capillary refill takes less than 2 seconds.  Neurological:     Mental Status: She is alert and oriented to person, place, and time.  Psychiatric:        Behavior: Behavior normal.      Musculoskeletal Exam: C-spine thoracic lumbar spine: Limited range of motion.  Shoulder joints, elbow joints, wrist joints with good range of motion.  She has DIP and PIP thickening but no synovitis.  She is in complete fist formation due to osteoarthritis.  Hip joints knee joints ankles were in good range of motion.  No synovitis was noted over ankles or feet.  She has bilateral hammertoes and bunion.  CDAI Exam: CDAI Score: 0.3  Patient Global Assessment: 2 (mm); Provider Global Assessment: 1 (mm) Swollen: 0 ; Tender: 0  Joint Exam   Not documented   There is currently no information documented on the homunculus. Go to the Rheumatology activity and complete the homunculus joint exam.  Investigation: Findings:  March 10, 2018 MRI of cervical spine done by Dr. Leta Baptist showed multilevel spondylosis and C4-5 and C5-C6 mild to moderate to spinal stenosis C5-6 moderate to severe right foraminal narrowing with possible right C6 nerve compression.  C6-C7 mild spinal stenosis.  T1-T2 minimal anterolisthesis.  Nerve conduction velocity and EMG showed axonal sensorimotor  polyneuropathy   Imaging: No results found.  Recent Labs: Lab Results  Component Value Date   WBC 8.9 05/12/2018   HGB 12.2 05/12/2018   PLT 444 (H) 05/12/2018   NA 134 (L) 05/12/2018   K 5.0 05/12/2018   CL 101 05/12/2018   CO2 26 05/12/2018   GLUCOSE 100 (H) 05/12/2018   BUN 15 05/12/2018   CREATININE 0.79 05/12/2018   BILITOT 0.3 05/12/2018   ALKPHOS 71 11/07/2016   AST 23 05/12/2018   ALT 16 05/12/2018   PROT 6.6 05/12/2018   ALBUMIN 4.1 11/07/2016   CALCIUM 10.3 05/12/2018   GFRAA 84 05/12/2018   QFTBGOLDPLUS NEGATIVE 11/07/2017    Speciality Comments: TB gold neg 11/07/17  Procedures:  No procedures performed Allergies: Sulfonamide derivatives; Amlodipine; Zostavax [zoster vaccine live]; and Norvasc [amlodipine besylate]   Assessment / Plan:     Visit Diagnoses: Rheumatoid arthritis involving multiple sites with positive rheumatoid factor (HCC) - RF+, ANA +.  Patient complains of increased pain and discomfort in her hands and feet.  She had no synovitis on examination.  She does have underlying osteoarthritis which causes discomfort as well.  She requests a referral to physical therapy for generalized deconditioning and muscle strengthening.  I will make the referral for her.  High risk medication use - Orencia SQ q month, MTX 0.8 ml sq q wk, folic acid 2 mg po qd(Inadequate response to Humira, Enbrel).  Patient's labs are stable except for chronic thrombocytosis.  Patient is concerned about thrombocytosis.  We will refer her to hematology as per her request.  Primary osteoarthritis of both hands-joint protection muscle strengthening was discussed.  Primary osteoarthritis of both knees-she is currently not having any discomfort in her knees.  DDD (degenerative disc disease), cervical-she has moderate spinal stenosis based on her recent MRI which I reviewed with her.  Patient would like to discuss these findings and correlation with the neuropathy.  Have advised her to  discuss that further with Dr. Leta Baptist.  DDD (degenerative disc disease), lumbar-she is currently not having much lower back discomfort.  Neuropathy -patient had nerve conduction velocity by Dr. Leta Baptist which showed sensorimotor neuropathy.  She was placed on gabapentin and had some improvement in her symptoms.  She would like the explanation for neuropathy.  I do not have a true explanation for her.  Her rheumatoid arthritis is very well controlled.  Have advised her to discuss this further with Dr. Leta Baptist.  She has an appointment coming up with them soon.  Other medical problems are listed as follows:  History of hyperlipidemia  History of gastroesophageal reflux (GERD)  History of glaucoma  SIADH (syndrome of inappropriate ADH production) ([place])  History of colonic polyps  History of sleep apnea  Abnormal laboratory test - Plan: Ambulatory referral to Hematology   Orders: Orders Placed This Encounter  Procedures  . Ambulatory referral to Hematology   No orders of the defined types were placed in this encounter.   Face-to-face time spent with patient was 45 minutes. Greater than 50% of time was spent in counseling and coordination of care.  Follow-Up Instructions: Return in about 5 months (around 12/21/2018) for Rheumatoid arthritis.   Bo Merino, MD  Note - This record has been created using Editor, commissioning.  Chart creation errors have been sought, but may not always  have been located. Such creation errors do not reflect on  the standard of medical care.

## 2018-07-23 ENCOUNTER — Other Ambulatory Visit: Payer: Self-pay | Admitting: *Deleted

## 2018-07-23 ENCOUNTER — Encounter: Payer: Self-pay | Admitting: Rheumatology

## 2018-07-23 ENCOUNTER — Ambulatory Visit: Payer: Medicare Other | Admitting: Rheumatology

## 2018-07-23 VITALS — BP 141/63 | HR 59 | Resp 16 | Ht 65.5 in | Wt 134.0 lb

## 2018-07-23 DIAGNOSIS — Z860100 Personal history of colon polyps, unspecified: Secondary | ICD-10-CM

## 2018-07-23 DIAGNOSIS — M0579 Rheumatoid arthritis with rheumatoid factor of multiple sites without organ or systems involvement: Secondary | ICD-10-CM | POA: Diagnosis not present

## 2018-07-23 DIAGNOSIS — M5136 Other intervertebral disc degeneration, lumbar region: Secondary | ICD-10-CM

## 2018-07-23 DIAGNOSIS — M170 Bilateral primary osteoarthritis of knee: Secondary | ICD-10-CM | POA: Diagnosis not present

## 2018-07-23 DIAGNOSIS — Z79899 Other long term (current) drug therapy: Secondary | ICD-10-CM | POA: Diagnosis not present

## 2018-07-23 DIAGNOSIS — Z8669 Personal history of other diseases of the nervous system and sense organs: Secondary | ICD-10-CM

## 2018-07-23 DIAGNOSIS — G629 Polyneuropathy, unspecified: Secondary | ICD-10-CM

## 2018-07-23 DIAGNOSIS — Z8639 Personal history of other endocrine, nutritional and metabolic disease: Secondary | ICD-10-CM

## 2018-07-23 DIAGNOSIS — Z8719 Personal history of other diseases of the digestive system: Secondary | ICD-10-CM

## 2018-07-23 DIAGNOSIS — M51369 Other intervertebral disc degeneration, lumbar region without mention of lumbar back pain or lower extremity pain: Secondary | ICD-10-CM

## 2018-07-23 DIAGNOSIS — E222 Syndrome of inappropriate secretion of antidiuretic hormone: Secondary | ICD-10-CM

## 2018-07-23 DIAGNOSIS — M5030 Other cervical disc degeneration, unspecified cervical region: Secondary | ICD-10-CM

## 2018-07-23 DIAGNOSIS — R899 Unspecified abnormal finding in specimens from other organs, systems and tissues: Secondary | ICD-10-CM

## 2018-07-23 DIAGNOSIS — M19042 Primary osteoarthritis, left hand: Secondary | ICD-10-CM

## 2018-07-23 DIAGNOSIS — M19041 Primary osteoarthritis, right hand: Secondary | ICD-10-CM

## 2018-07-23 DIAGNOSIS — Y93B9 Activity, other involving muscle strengthening exercises: Secondary | ICD-10-CM

## 2018-07-23 NOTE — Patient Instructions (Signed)
Standing Labs We placed an order today for your standing lab work.    Please come back and get your standing labs in March and every 3 months  We have open lab Monday through Friday from 8:30-11:30 AM and 1:30-4:00 PM  at the office of Dr. Kazmir Oki.   You may experience shorter wait times on Monday and Friday afternoons. The office is located at [address], [address], [place], [place] [address] No appointment is necessary.   Labs are drawn by [facility].  You may receive a bill from [facility] for your lab work.  If you wish to have your labs drawn at another location, please call the office 24 hours in advance to send orders.  If you have any questions regarding directions or hours of operation,  please call [phone].   Just as a reminder please drink plenty of water prior to coming for your lab work. Thanks!  

## 2018-07-29 ENCOUNTER — Telehealth: Payer: Self-pay | Admitting: Internal Medicine

## 2018-07-29 ENCOUNTER — Other Ambulatory Visit: Payer: Self-pay | Admitting: Rheumatology

## 2018-07-29 NOTE — Telephone Encounter (Signed)
Last Visit: 07/23/18 Next Visit: 12/22/18 Labs: 05/12/18 stable. Mild thrombocytosis   Okay to refill per Dr. Estanislado Pandy

## 2018-07-29 NOTE — Telephone Encounter (Signed)
A new hem appt has been scheduled for the pt to see Dr. Julien Nordmann on 2/29 at 10:15am with labs at 10am. Pt aware to arrive 30 minutes early to be checked in on time.

## 2018-07-29 NOTE — Telephone Encounter (Signed)
Added lab to 2/29 new patient visit. Left message for patient. Patient asked in initial call to arrive early for lab.

## 2018-07-31 ENCOUNTER — Other Ambulatory Visit: Payer: Self-pay | Admitting: Internal Medicine

## 2018-07-31 DIAGNOSIS — D75839 Thrombocytosis, unspecified: Secondary | ICD-10-CM

## 2018-07-31 DIAGNOSIS — D473 Essential (hemorrhagic) thrombocythemia: Secondary | ICD-10-CM

## 2018-08-01 ENCOUNTER — Encounter: Payer: Self-pay | Admitting: Internal Medicine

## 2018-08-01 ENCOUNTER — Inpatient Hospital Stay: Payer: Medicare Other | Attending: Internal Medicine | Admitting: Internal Medicine

## 2018-08-01 ENCOUNTER — Telehealth: Payer: Self-pay | Admitting: Internal Medicine

## 2018-08-01 ENCOUNTER — Inpatient Hospital Stay: Payer: Medicare Other

## 2018-08-01 VITALS — BP 165/77 | HR 64 | Temp 97.8°F | Resp 18 | Ht 65.5 in | Wt 138.3 lb

## 2018-08-01 DIAGNOSIS — Z87891 Personal history of nicotine dependence: Secondary | ICD-10-CM | POA: Diagnosis not present

## 2018-08-01 DIAGNOSIS — R5383 Other fatigue: Secondary | ICD-10-CM | POA: Diagnosis not present

## 2018-08-01 DIAGNOSIS — I10 Essential (primary) hypertension: Secondary | ICD-10-CM | POA: Diagnosis not present

## 2018-08-01 DIAGNOSIS — Z86010 Personal history of colonic polyps: Secondary | ICD-10-CM | POA: Insufficient documentation

## 2018-08-01 DIAGNOSIS — D473 Essential (hemorrhagic) thrombocythemia: Secondary | ICD-10-CM

## 2018-08-01 DIAGNOSIS — M069 Rheumatoid arthritis, unspecified: Secondary | ICD-10-CM | POA: Diagnosis not present

## 2018-08-01 DIAGNOSIS — G629 Polyneuropathy, unspecified: Secondary | ICD-10-CM

## 2018-08-01 DIAGNOSIS — D649 Anemia, unspecified: Secondary | ICD-10-CM | POA: Diagnosis not present

## 2018-08-01 DIAGNOSIS — D75839 Thrombocytosis, unspecified: Secondary | ICD-10-CM | POA: Insufficient documentation

## 2018-08-01 DIAGNOSIS — K219 Gastro-esophageal reflux disease without esophagitis: Secondary | ICD-10-CM | POA: Diagnosis not present

## 2018-08-01 DIAGNOSIS — Z79899 Other long term (current) drug therapy: Secondary | ICD-10-CM | POA: Insufficient documentation

## 2018-08-01 DIAGNOSIS — R7989 Other specified abnormal findings of blood chemistry: Secondary | ICD-10-CM | POA: Insufficient documentation

## 2018-08-01 DIAGNOSIS — M2550 Pain in unspecified joint: Secondary | ICD-10-CM | POA: Diagnosis not present

## 2018-08-01 DIAGNOSIS — G4730 Sleep apnea, unspecified: Secondary | ICD-10-CM | POA: Diagnosis not present

## 2018-08-01 DIAGNOSIS — H409 Unspecified glaucoma: Secondary | ICD-10-CM

## 2018-08-01 LAB — VITAMIN B12: Vitamin B-12: 838 pg/mL (ref 180–914)

## 2018-08-01 LAB — CBC WITH DIFFERENTIAL (CANCER CENTER ONLY)
Abs Immature Granulocytes: 0.03 10*3/uL (ref 0.00–0.07)
Basophils Absolute: 0.1 10*3/uL (ref 0.0–0.1)
Basophils Relative: 1 %
Eosinophils Absolute: 0.1 10*3/uL (ref 0.0–0.5)
Eosinophils Relative: 1 %
HCT: 36.3 % (ref 36.0–46.0)
Hemoglobin: 11.9 g/dL — ABNORMAL LOW (ref 12.0–15.0)
Immature Granulocytes: 0 %
Lymphocytes Relative: 22 %
Lymphs Abs: 1.9 10*3/uL (ref 0.7–4.0)
MCH: 30.7 pg (ref 26.0–34.0)
MCHC: 32.8 g/dL (ref 30.0–36.0)
MCV: 93.6 fL (ref 80.0–100.0)
Monocytes Absolute: 0.7 10*3/uL (ref 0.1–1.0)
Monocytes Relative: 9 %
Neutro Abs: 5.8 10*3/uL (ref 1.7–7.7)
Neutrophils Relative %: 67 %
Platelet Count: 372 10*3/uL (ref 150–400)
RBC: 3.88 MIL/uL (ref 3.87–5.11)
RDW: 14 % (ref 11.5–15.5)
WBC Count: 8.6 10*3/uL (ref 4.0–10.5)
nRBC: 0 % (ref 0.0–0.2)

## 2018-08-01 LAB — COMPREHENSIVE METABOLIC PANEL
ALT: 16 U/L (ref 0–44)
AST: 21 U/L (ref 15–41)
Albumin: 4 g/dL (ref 3.5–5.0)
Alkaline Phosphatase: 67 U/L (ref 38–126)
Anion gap: 5 (ref 5–15)
BUN: 19 mg/dL (ref 8–23)
CO2: 26 mmol/L (ref 22–32)
Calcium: 9.6 mg/dL (ref 8.9–10.3)
Chloride: 99 mmol/L (ref 98–111)
Creatinine, Ser: 0.82 mg/dL (ref 0.44–1.00)
GFR calc Af Amer: >60 mL/min (ref >60)
GFR calc non Af Amer: >60 mL/min (ref >60)
Glucose, Bld: 98 mg/dL (ref 70–99)
Potassium: 5 mmol/L (ref 3.5–5.1)
Sodium: 130 mmol/L — ABNORMAL LOW (ref 135–145)
Total Bilirubin: 0.5 mg/dL (ref 0.3–1.2)
Total Protein: 6.9 g/dL (ref 6.5–8.1)

## 2018-08-01 LAB — FOLATE: Folate: 22.6 ng/mL (ref >5.9)

## 2018-08-01 LAB — LACTATE DEHYDROGENASE: LDH: 149 U/L (ref 98–192)

## 2018-08-01 NOTE — Progress Notes (Signed)
Felicia [name] Telephone:([phone]   Fax:([phone]) [phone]  CONSULT NOTE  REFERRING PHYSICIAN: Dr. Bo Merino  REASON FOR CONSULTATION:  78 years old white female with thrombocytosis.  HPI Felicia [name] is a 78 y.o. female with past medical history significant for hypertension, rheumatoid arthritis, GERD, glaucoma, peripheral neuropathy, sleep apnea as well as history of adenomatous colon polyps.  The patient was seen by Dr. Estanislado Pandy recently for evaluation of rheumatoid arthritis.  She also has a history of peripheral neuropathy.  During her evaluation CBC showed elevated platelets count of 444,000.  She was referred to me today for further evaluation and recommendation regarding her condition.  Reviewing her previous blood work her platelets count were 355,000 on November 07, 2017.  It was up to 412,000 on February 12, 2018. The patient is feeling fine with no concerning complaints.  She also has a history of mild anemia.  She has been on treatment with methotrexate and Orencia for the rheumatoid arthritis. The patient denied having any bleeding, bruises or ecchymosis.  She denied having any recent weight loss or night sweats.  She has no nausea, vomiting, diarrhea or constipation.  She has no chest pain, shortness of breath, cough or hemoptysis.  She had a colonoscopy in 2017 that showed diverticulosis. Family history significant for mother with COPD and dementia, father had heart disease, brother had skin cancer. The patient is single and has no children.  She is accompanied today by a friend Felicia [name].  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  She has a history of smoking for around 5 years but quit long time ago.  She has no history of alcohol or drug abuse.  HPI  Past Medical History:  Diagnosis Date  . Adenomatous colon polyp 2004   Colo in 2009 "no polyps"  . Complication of anesthesia    small airway per patient  . GERD (gastroesophageal reflux disease)     . Glaucoma   . Hiatal hernia   . Hypertension   . Neuropathy   . Rheumatoid arthritis(714.0)    Orencia; Methotrexate (Dr. Patrecia Pour)  . Sleep apnea    on CPAP    Past Surgical History:  Procedure Laterality Date  . CARDIAC CATHETERIZATION N/A 03/29/2015   Procedure: Left Heart Cath and Coronary Angiography;  Surgeon: Belva Crome, MD;  Location: [facility];  Service: Cardiovascular;  Laterality: N/A;  . CATARACT EXTRACTION, BILATERAL    . COLONOSCOPY W/ POLYPECTOMY     X1; negative subsequently  . DILATION AND CURETTAGE OF UTERUS    . EYE SURGERY     due to RA  . EYE SURGERY Left 06/2017   eyelid tendon   . HYSTEROSCOPY W/D&C  06/24/2012   Procedure: DILATATION AND CURETTAGE /HYSTEROSCOPY;  Surgeon: [name], MD;  Location: [facility];  Service: Gynecology;  Laterality: N/A;  . SHOULDER SURGERY Left    due to RA  . TRABECULECTOMY     X 2 OD; X 1 OS  . UPPER GASTROINTESTINAL ENDOSCOPY     hiatal hernia    Family History  Problem Relation Age of Onset  . Dementia Mother   . Lung disease Mother        bronchiectasis  . Heart disease Mother        Aortic Stenosis  . Osteoporosis Mother   . Heart attack Father 32       S/P CBAG  . Stroke Maternal Grandmother 83  .  Ulcerative colitis Brother   . Diabetes Neg Hx   . Cancer Neg Hx     Social History Social History   Tobacco Use  . Smoking status: Former Smoker    Packs/day: 0.50    Years: 5.00    Pack years: 2.50    Types: Cigarettes    Last attempt to quit: 06/04/1971    Years since quitting: 47.1  . Smokeless tobacco: Never Used  . Tobacco comment: smoked about 3-5 years , up to 1/2 ppd  Substance Use Topics  . Alcohol use: No  . Drug use: No    Allergies  Allergen Reactions  . Sulfonamide Derivatives     Rash, redness  . Amlodipine Swelling  . Zostavax [Zoster Vaccine Live]     Unable to get due to RA, per patient   . Norvasc [Amlodipine Besylate]     edema    Current Outpatient  Medications  Medication Sig Dispense Refill  . ALPRAZolam (XANAX) 0.5 MG tablet Take 1 tablet (0.5 mg total) by mouth as needed for anxiety (for sedation before MRI scan; take 1 hour before scan; may repeat 15 min before scan). (Patient not taking: Reported on 07/23/2018) 3 tablet 0  . Calcium Carbonate-Vitamin D (CALTRATE 600+D) 600-400 MG-UNIT per tablet Take 2 tablets by mouth daily.     . Carboxymethylcellulose Sodium (REFRESH OP) Apply 1 drop to eye daily as needed (FOR DRY EYES).     . Cholecalciferol (VITAMIN D3) 1000 UNITS CAPS Take 1 capsule by mouth daily.     Marland Kitchen esomeprazole (NEXIUM) 40 MG capsule Take 1 capsule (40 mg total) by mouth 2 (two) times daily before a meal. 180 capsule 3  . estradiol (ESTRACE) 1 MG tablet Take 1 mg by mouth daily.  0  . folic acid (FOLVITE) 1 MG tablet TAKE 2 TABLETS BY MOUTH EVERY DAY 180 tablet 0  . gabapentin (NEURONTIN) 100 MG capsule Take 1-3 capsules (100-300 mg total) by mouth at bedtime. 90 capsule 6  . Krill Oil Omega-3 300 MG CAPS Take 1 capsule by mouth daily.    Marland Kitchen latanoprost (XALATAN) 0.005 % ophthalmic solution Place 1 drop at bedtime into the right eye.     . losartan (COZAAR) 100 MG tablet Take 100 mg by mouth daily.    . Methotrexate Sodium (METHOTREXATE, PF,) 50 MG/2ML injection ADMINISTER 0.8 ML UNDER THE SKIN EVERY WEEK. 10 mL 0  . metoprolol (LOPRESSOR) 50 MG tablet Take 50 mg by mouth daily.   0  . metroNIDAZOLE (METROGEL) 0.75 % gel     . metroNIDAZOLE (METROGEL) 1 % gel Apply 1 application topically 2 (two) times daily.     . MULTIPLE VITAMIN PO Take by mouth.    Marland Kitchen NEEDLE, DISP, 27 G (BD DISP NEEDLES) 27G X 1/2" MISC To use with Methotrexate/ additional needles needed so patient can change needle before injecting to reduce pain from dull needle 12 each 3  . ORENCIA 125 MG/ML SOSY INJECT 125MG  SUBCUTANEOUSLY ONCE WEEKLY 12 mL 0  . OVER THE COUNTER MEDICATION daily.    . progesterone (PROMETRIUM) 200 MG capsule Take 200 mg by mouth  daily.    . Psyllium (METAMUCIL PO) 1 tbs by mouth once daily    . timolol (TIMOPTIC) 0.5 % ophthalmic solution Place 1 drop into both eyes daily.     . traMADol (ULTRAM) 50 MG tablet Take 50 mg by mouth at bedtime as needed (pain). Reported on 06/14/2015    . TURMERIC PO  Take by mouth daily as needed.     . valACYclovir (VALTREX) 1000 MG tablet Take 1,000 mg by mouth as needed (fever blister). Reported on 06/14/2015    . Wheat Dextrin (BENEFIBER DRINK MIX PO) Take at bedtime by mouth.     No current facility-administered medications for this visit.     Review of Systems  Constitutional: positive for fatigue Eyes: negative Ears, nose, mouth, throat, and face: negative Respiratory: negative Cardiovascular: negative Gastrointestinal: negative Genitourinary:negative Integument/breast: negative Hematologic/lymphatic: negative Musculoskeletal:positive for arthralgias Neurological: negative Behavioral/Psych: negative Endocrine: negative Allergic/Immunologic: negative  Physical Exam  OHY:WVPXT, healthy, no distress, well nourished and well developed SKIN: skin color, texture, turgor are normal, no rashes or significant lesions HEAD: Normocephalic, No masses, lesions, tenderness or abnormalities EYES: normal, PERRLA, Conjunctiva are pink and non-injected EARS: External ears normal, Canals clear OROPHARYNX:no exudate, no erythema and lips, buccal mucosa, and tongue normal  NECK: supple, no adenopathy, no JVD LYMPH:  no palpable lymphadenopathy, no hepatosplenomegaly BREAST:not examined LUNGS: clear to auscultation , and palpation HEART: regular rate & rhythm, no murmurs and no gallops ABDOMEN:abdomen soft, non-tender, normal bowel sounds and no masses or organomegaly BACK: Back symmetric, no curvature., No CVA tenderness EXTREMITIES:no joint deformities, effusion, or inflammation, no edema  NEURO: alert & oriented x 3 with fluent speech, no focal motor/sensory deficits  PERFORMANCE  STATUS: ECOG 0  LABORATORY DATA: Lab Results  Component Value Date   WBC 8.6 08/01/2018   HGB 11.9 (L) 08/01/2018   HCT 36.3 08/01/2018   MCV 93.6 08/01/2018   PLT 372 08/01/2018      Chemistry      Component Value Date/Time   NA 130 (L) 08/01/2018 0938   K 5.0 08/01/2018 0938   CL 99 08/01/2018 0938   CO2 26 08/01/2018 0938   BUN 19 08/01/2018 0938   CREATININE 0.82 08/01/2018 0938   CREATININE 0.79 05/12/2018 1544      Component Value Date/Time   CALCIUM 9.6 08/01/2018 0938   ALKPHOS 67 08/01/2018 0938   AST 21 08/01/2018 0938   ALT 16 08/01/2018 0938   BILITOT 0.5 08/01/2018 0938       RADIOGRAPHIC STUDIES: No results found.  ASSESSMENT: This is a very pleasant 78 years old white female presented for evaluation of thrombocytosis which is likely reactive in nature.  PLAN: I had a lengthy discussion with the patient today about her condition.  I order several studies for evaluation of her thrombocytosis and repeat CBC today showed normal platelet count. The patient has mild anemia which is concerning for anemia of chronic disease. I order iron study, ferritin, serum folate as well as vitamin B12 and LDH for further evaluation of her condition. I provided the patient with assurance that her thrombocytosis is likely reactive in nature either secondary to iron deficiency or the inflammatory process with rheumatoid arthritis. I recommended for the patient to continue on observation with routine follow-up visit by her primary care physician as well as her rheumatologist. If the iron study showed any concerning iron deficiency, will call the patient to start oral iron tablets. I will see the patient on as-needed basis at this point. I am sure Dr. Estanislado Pandy has discussed with the patient the risk of malignancy with her current rheumatoid arthritis medication like Orencia which increases the risk for lymphoma as well as acute myeloid leukemia. She was advised to call  immediately if she has any concerning symptoms in the interval. The patient voices understanding of current disease status  and treatment options and is in agreement with the current care plan. All questions were answered. The patient knows to call the clinic with any problems, questions or concerns. We can certainly see the patient much sooner if necessary.  Thank you so much for allowing me to participate in the care of Felicia [name]. I will continue to follow up the patient with you and assist in her care.  I spent 40 minutes counseling the patient face to face. The total time spent in the appointment was 60 minutes.  Disclaimer: This note was dictated with voice recognition software. Similar sounding words can inadvertently be transcribed and may not be corrected upon review.   Eilleen Kempf August 01, 2018, 9:47 AM

## 2018-08-01 NOTE — Telephone Encounter (Signed)
F/u as needed per los

## 2018-08-03 LAB — FERRITIN: Ferritin: 121 ng/mL (ref 11–307)

## 2018-08-03 LAB — IRON AND TIBC
Iron: 77 ug/dL (ref 41–142)
Saturation Ratios: 25 % (ref 21–57)
TIBC: 313 ug/dL (ref 236–444)
UIBC: 236 ug/dL (ref 120–384)

## 2018-08-05 ENCOUNTER — Ambulatory Visit: Payer: Medicare Other | Attending: Rheumatology | Admitting: Physical Therapy

## 2018-08-05 ENCOUNTER — Other Ambulatory Visit: Payer: Self-pay

## 2018-08-05 ENCOUNTER — Encounter: Payer: Self-pay | Admitting: Physical Therapy

## 2018-08-05 DIAGNOSIS — R2689 Other abnormalities of gait and mobility: Secondary | ICD-10-CM

## 2018-08-05 DIAGNOSIS — M6281 Muscle weakness (generalized): Secondary | ICD-10-CM | POA: Diagnosis present

## 2018-08-05 DIAGNOSIS — R2681 Unsteadiness on feet: Secondary | ICD-10-CM

## 2018-08-05 NOTE — Therapy (Signed)
[place], [place], [address] Phone: [phone]   Fax:  [phone]  Physical Therapy Evaluation  Patient Details  Name: Felicia [name] MRN: [id number] Date of Birth: 11-07-1940 Referring Provider (PT): Bo Merino MD   Encounter Date: 08/05/2018  PT End of Session - 08/05/18 1102    Visit Number  1    Number of Visits  17    Date for PT Re-Evaluation  09/30/18    Authorization Type  MCR: kx mod by 15th visit, progress note at 10th     PT Start Time  1015    PT Stop Time  1059    PT Time Calculation (min)  44 min    Activity Tolerance  Patient tolerated treatment well    Behavior During Therapy  [facility] for tasks assessed/performed       Past Medical History:  Diagnosis Date  . Adenomatous colon polyp 2004   Colo in 2009 "no polyps"  . Complication of anesthesia    small airway per patient  . GERD (gastroesophageal reflux disease)   . Glaucoma   . Hiatal hernia   . Hypertension   . Neuropathy   . Rheumatoid arthritis(714.0)    Orencia; Methotrexate (Dr. Patrecia Pour)  . Sleep apnea    on CPAP    Past Surgical History:  Procedure Laterality Date  . CARDIAC CATHETERIZATION N/A 03/29/2015   Procedure: Left Heart Cath and Coronary Angiography;  Surgeon: Belva Crome, MD;  Location: [facility];  Service: Cardiovascular;  Laterality: N/A;  . CATARACT EXTRACTION, BILATERAL    . COLONOSCOPY W/ POLYPECTOMY     X1; negative subsequently  . DILATION AND CURETTAGE OF UTERUS    . EYE SURGERY     due to RA  . EYE SURGERY Left 06/2017   eyelid tendon   . HYSTEROSCOPY W/D&C  06/24/2012   Procedure: DILATATION AND CURETTAGE /HYSTEROSCOPY;  Surgeon: [name], MD;  Location: [facility];  Service: Gynecology;  Laterality: N/A;  . SHOULDER SURGERY Left    due to RA  . TRABECULECTOMY     X 2 OD; X 1 OS  . UPPER GASTROINTESTINAL ENDOSCOPY     hiatal hernia    There were no vitals filed for  this visit.   Subjective Assessment - 08/05/18 1018    Subjective  pt is a 78 y.o F with CC of feeling unstable and general weakness. She reports having a hx of neuropathy which effects her balance. she reports she has a lot of fatigue. she notes this general fatigue and deconditioning has started about a year ago and has progressively worsened..    Pertinent History  hx of RA    Limitations  Sitting;Standing;Walking;Other (comment)   stairs   How long can you sit comfortably?  60 min     How long can you stand comfortably?  30 min     How long can you walk comfortably?  30 min     Diagnostic tests  03/10/2018 MRI,     Patient Stated Goals  to be able to walk as normal as possible, increase endurance, promote good posture    Currently in Pain?  Yes    Pain Score  5    at worst 8/10   Pain Location  Leg    Pain Orientation  Right;Left    Pain Descriptors / Indicators  Burning    Pain Type  Chronic pain;Neuropathic pain    Pain  Onset  More than a month ago    Pain Frequency  Constant    Aggravating Factors   prolonged standing/ walking, unsure of exact exacerbation    Pain Relieving Factors  medication, moving around    Effect of Pain on Daily Activities  limited walking/ standing          [facility] PT Assessment - 08/05/18 1010      Assessment   Medical Diagnosis  Activity involving muscle strengthening exercises    deconditioning   Referring Provider (PT)  Bo Merino MD    Onset Date/Surgical Date  --   1 year ago   Hand Dominance  --   ambidextrous   Next MD Visit  July 2020    Prior Therapy  yes      Precautions   Precautions  None    Precaution Comments  avoid golf/ tennis      Restrictions   Weight Bearing Restrictions  No      Balance Screen   Has the patient fallen in the past 6 months  No    Has the patient had a decrease in activity level because of a fear of falling?   No    Is the patient reluctant to leave their home because of a fear of falling?   No       Home Social worker  Private residence    Living Arrangements  Non-relatives/Friends    Available Help at Discharge  Family    Type of [place] to enter    Entrance Stairs-Number of Steps  4    Entrance Stairs-Rails  Cannot reach both    [place]  One level   drop down level with 3 stesp and 1 [place]  Crutches;Cane - single point      Prior Function   Level of Independence  Independent with basic ADLs    Vocation  Retired    Leisure  traveling, walking around, reading,       Cognition   Overall Cognitive Status  Within Functional Limits for tasks assessed      Posture/Postural Control   Posture/Postural Control  Postural limitations    Postural Limitations  Forward head;Rounded Shoulders      ROM / Strength   AROM / PROM / Strength  AROM;Strength      Strength   Strength Assessment Site  Knee;Hip;Ankle    Right/Left Hip  Left;Right    Right Hip Flexion  4/5    Right Hip Extension  3+/5    Right Hip ABduction  3+/5    Left Hip Flexion  4/5    Left Hip Extension  3+/5    Left Hip ABduction  3+/5    Right/Left Knee  Right;Left    Right Knee Flexion  4/5    Right Knee Extension  4/5    Left Knee Flexion  4/5    Left Knee Extension  4/5    Right/Left Ankle  Right;Left    Right Ankle Dorsiflexion  4+/5    Right Ankle Plantar Flexion  4/5    Right Ankle Inversion  4/5    Right Ankle Eversion  4/5    Left Ankle Dorsiflexion  4/5    Left Ankle Plantar Flexion  4/5    Left Ankle Inversion  4/5    Left Ankle Eversion  4/5      Standardized Balance Assessment  Standardized Balance Assessment  Berg Balance Test      Berg Balance Test   Sit to Stand  Able to stand without using hands and stabilize independently    Standing Unsupported  Able to stand 2 minutes with supervision    Sitting with Back Unsupported but Feet Supported on Floor or Stool  Able to sit safely and securely 2 minutes    Stand to Sit   Sits safely with minimal use of hands    Transfers  Able to transfer safely, definite need of hands    Standing Unsupported with Eyes Closed  Able to stand 10 seconds with supervision    Standing Unsupported with Feet Together  Able to place feet together independently and stand for 1 minute with supervision    From Standing, Reach Forward with Outstretched Arm  Can reach forward >12 cm safely (5")    From Standing Position, Pick up Object from Floor  Able to pick up shoe, needs supervision    From Standing Position, Turn to Look Behind Over each Shoulder  Turn sideways only but maintains balance    Turn 360 Degrees  Able to turn 360 degrees safely but slowly    Standing Unsupported, Alternately Place Feet on Step/Stool  Able to complete >2 steps/needs minimal assist    Standing Unsupported, One Foot in Front  Needs help to step but can hold 15 seconds    Standing on One Leg  Able to lift leg independently and hold equal to or more than 3 seconds    Total Score  38                Objective measurements completed on examination: See above findings.      [facility] Adult PT Treatment/Exercise - 08/05/18 1010      Knee/Hip Exercises: Supine   Bridges  2 sets;10 reps;Strengthening;Both    Other Supine Knee/Hip Exercises  clamshells with red theraband 1 x 10             PT Education - 08/05/18 1101    Education Details  evaluation findings, POC, goals, HEP with proper form/ rationale.     Person(s) Educated  Patient    Methods  Explanation;Verbal cues    Comprehension  Verbalized understanding;Verbal cues required       PT Short Term Goals - 08/05/18 1247      PT SHORT TERM GOAL #1   Title  pt be I with inital HEP     Time  4    Period  Weeks    Status  New    Target Date  09/02/18      PT SHORT TERM GOAL #2   Title  pt to verbalize/ demo proper posture and lifting mechanics to prevent and reduce hip/back pain    Time  4    Period  Weeks    Status  New    Target  Date  09/02/18      PT SHORT TERM GOAL #3   Title  improve Berg balance to >/= 42/56 to demo functional balance progression     Baseline  38/56    Time  4    Period  Weeks    Status  New    Target Date  09/02/18        PT Long Term Goals - 08/05/18 1249      PT LONG TERM GOAL #1   Title  pt to improve bil LE strength grossly to >/=4+/5 in  to promote stability and stafety with standing/ walking     Time  8    Period  Weeks    Status  New    Target Date  09/30/18      PT LONG TERM GOAL #2   Title  increase berg balance to >/= 50/56 to promote stability and safety     Time  8    Period  Weeks    Status  New    Target Date  09/30/18      PT LONG TERM GOAL #3   Title  pt to be able to stand/ walk >/= 30 min with LRAD and navigate up/down 12 steps reciprocally to promote functional mobility     Time  8    Period  Weeks    Status  New    Target Date  09/30/18      PT LONG TERM GOAL #4   Title  pt to be I with all HEp given as of last visit to maintain and progress current level of function     Time  8    Period  Weeks    Status  New    Target Date  09/30/18             Plan - 08/05/18 1240    Clinical Impression Statement  pt presents to OPPT with CC of feeling unstable and general fatigue. she exhibits abnormal gait demonstrating intermittent instability and general antalgic pattern. weakness noted in bil LE and pt scored a 38/56 on BERG suggesting high fall risk. She would benefit from physical therapy to improve strength, maximize stability/ endurnace by addressing the deficits listed.     Personal Factors and Comorbidities  Age;Comorbidity 2    Comorbidities  neuropathy and RA    Examination-Activity Limitations  Stairs;Stand    Examination-Participation Restrictions  Community Activity    Stability/Clinical Decision Making  Evolving/Moderate complexity    Clinical Decision Making  Moderate    Rehab Potential  Good    PT Frequency  2x / week    PT Duration  8  weeks    PT Treatment/Interventions  ADLs/Self Care Home Management;Cryotherapy;Electrical Stimulation;Moist Heat;Iontophoresis 4mg /ml Dexamethasone;Traction;Therapeutic exercise;Balance training;Therapeutic activities;Gait training;Taping;Manual techniques;Passive range of motion;Patient/family education    PT Next Visit Plan  review / update HEP, hip/ core strengthening, balance training/ gait training, functional mobility     PT Home Exercise Plan  4 -way ankle strength, clamshell, bridges.     Consulted and Agree with Plan of Care  Patient       Patient will benefit from skilled therapeutic intervention in order to improve the following deficits and impairments:  Decreased balance, Decreased endurance, Decreased activity tolerance, Pain, Postural dysfunction, Improper body mechanics, Decreased strength  Visit Diagnosis: Unsteadiness on feet  Other abnormalities of gait and mobility  Muscle weakness (generalized)     Problem List Patient Active Problem List   Diagnosis Date Noted  . Thrombocytosis ([place]) 08/01/2018  . URI (upper respiratory infection) 04/14/2017  . Primary osteoarthritis of both knees 03/06/2017  . Exertional chest pain   . Pain in the chest 03/27/2015  . Postural hypotension 05/26/2013  . OSA (obstructive sleep apnea) 03/22/2013  . Osteoarthritis 11/07/2011  . Abdominal bruit 10/16/2011  . HTN (hypertension) 08/13/2011  . Anisocoria 08/13/2011  . Cervical radiculopathy 07/17/2011  . DDD (degenerative disc disease), cervical 07/10/2011  . Glaucoma 06/06/2011  . HIATAL HERNIA 01/30/2010  . POSTMENOPAUSAL SYNDROME 12/14/2009  . EUSTACHIAN TUBE DYSFUNCTION, RIGHT  06/28/2009  . GERD 12/07/2008  . Rheumatoid arthritis ([place]) 12/07/2008  . COLONIC POLYPS, HX OF 12/07/2008  . HYPERLIPIDEMIA 10/17/2006  . HYPERCALCEMIA 10/17/2006  . MITRAL VALVE PROLAPSE 10/17/2006   Starr Lake PT, DPT, LAT, ATC  08/05/18  12:53 PM      [facility] [address] [place], [place], [address] Phone: [phone]   Fax:  [phone]  Name: AKASIA AHMAD MRN: [id number] Date of Birth: 03-30-1941

## 2018-08-09 ENCOUNTER — Other Ambulatory Visit: Payer: Self-pay | Admitting: Rheumatology

## 2018-08-10 NOTE — Telephone Encounter (Signed)
Last Visit: 07/23/18 Next Visit: 12/22/18 Labs: 08/01/18 Hgb 11.9 Sodium 130   Okay to refill per Dr. Estanislado Pandy

## 2018-08-17 ENCOUNTER — Other Ambulatory Visit: Payer: Self-pay

## 2018-08-17 ENCOUNTER — Ambulatory Visit: Payer: Medicare Other | Admitting: Physical Therapy

## 2018-08-17 DIAGNOSIS — Z79899 Other long term (current) drug therapy: Secondary | ICD-10-CM

## 2018-08-18 LAB — COMPLETE METABOLIC PANEL WITH GFR
AG Ratio: 1.7 (calc) (ref 1.0–2.5)
ALT: 21 U/L (ref 6–29)
AST: 26 U/L (ref 10–35)
Albumin: 3.9 g/dL (ref 3.6–5.1)
Alkaline phosphatase (APISO): 71 U/L (ref 37–153)
BUN: 12 mg/dL (ref 7–25)
CO2: 25 mmol/L (ref 20–32)
Calcium: 9.8 mg/dL (ref 8.6–10.4)
Chloride: 102 mmol/L (ref 98–110)
Creat: 0.81 mg/dL (ref 0.60–0.93)
GFR, Est African American: 82 mL/min/{1.73_m2} (ref >=60)
GFR, Est Non African American: 71 mL/min/{1.73_m2} (ref >=60)
Globulin: 2.3 g/dL (calc) (ref 1.9–3.7)
Glucose, Bld: 107 mg/dL — ABNORMAL HIGH (ref 65–99)
Potassium: 5.1 mmol/L (ref 3.5–5.3)
Sodium: 134 mmol/L — ABNORMAL LOW (ref 135–146)
Total Bilirubin: 0.3 mg/dL (ref 0.2–1.2)
Total Protein: 6.2 g/dL (ref 6.1–8.1)

## 2018-08-18 LAB — CBC WITH DIFFERENTIAL/PLATELET
Absolute Monocytes: 611 cells/uL (ref 200–950)
Basophils Absolute: 52 cells/uL (ref 0–200)
Basophils Relative: 0.6 %
Eosinophils Absolute: 112 cells/uL (ref 15–500)
Eosinophils Relative: 1.3 %
HCT: 34.4 % — ABNORMAL LOW (ref 35.0–45.0)
Hemoglobin: 11.6 g/dL — ABNORMAL LOW (ref 11.7–15.5)
Lymphs Abs: 2374 cells/uL (ref 850–3900)
MCH: 31.2 pg (ref 27.0–33.0)
MCHC: 33.7 g/dL (ref 32.0–36.0)
MCV: 92.5 fL (ref 80.0–100.0)
MPV: 9.6 fL (ref 7.5–12.5)
Monocytes Relative: 7.1 %
Neutro Abs: 5452 cells/uL (ref 1500–7800)
Neutrophils Relative %: 63.4 %
Platelets: 463 10*3/uL — ABNORMAL HIGH (ref 140–400)
RBC: 3.72 10*6/uL — ABNORMAL LOW (ref 3.80–5.10)
RDW: 13.5 % (ref 11.0–15.0)
Total Lymphocyte: 27.6 %
WBC: 8.6 10*3/uL (ref 3.8–10.8)

## 2018-08-18 NOTE — Progress Notes (Signed)
Labs are stable.

## 2018-08-19 ENCOUNTER — Encounter: Payer: Medicare Other | Admitting: Physical Therapy

## 2018-08-24 ENCOUNTER — Encounter: Payer: Medicare Other | Admitting: Physical Therapy

## 2018-08-26 ENCOUNTER — Encounter: Payer: Medicare Other | Admitting: Physical Therapy

## 2018-08-31 ENCOUNTER — Encounter: Payer: Medicare Other | Admitting: Physical Therapy

## 2018-09-02 ENCOUNTER — Ambulatory Visit: Payer: Medicare Other | Admitting: Physical Therapy

## 2018-09-03 ENCOUNTER — Telehealth: Payer: Self-pay | Admitting: Physical Therapy

## 2018-09-03 NOTE — Telephone Encounter (Signed)
Felicia [name] was contacted today regarding the temporary reduction of OP Rehab Services due to concerns for community transmission of Covid-19.  She was left a Merchandiser, retail advised the patient to continue to perform their HEP. The patient was offered the continuation in their POC by using methods such as an e-visit, virtual check in, or telehealth visit. She was advised if this interests her to call the clinic.    Outpatient Rehabilitation Services will follow up with this client when we are able to safely resume care at the [facility] in person.   Patient is aware we can be reached by telephone during limited business hours in the meantime.

## 2018-09-07 ENCOUNTER — Ambulatory Visit: Payer: Medicare Other | Admitting: Physical Therapy

## 2018-09-09 ENCOUNTER — Ambulatory Visit: Payer: Medicare Other | Admitting: Physical Therapy

## 2018-09-16 ENCOUNTER — Other Ambulatory Visit: Payer: Self-pay | Admitting: Diagnostic Neuroimaging

## 2018-09-16 ENCOUNTER — Telehealth: Payer: Self-pay | Admitting: *Deleted

## 2018-09-16 NOTE — Telephone Encounter (Signed)
Called patient and informed her due to current COVID 19 pandemic, our office is severely reducing in person visits in order to minimize the risk to our patients and healthcare providers. We recommend to convert your appointment to a video visit. We'll take all precautions to reduce any security or privacy concerns. This will be treated like an office visit, and we will file with your insurance. She consented to video visit. Pt's email is [email or web address]  Pt understands that the [employer] webex software must be downloaded and operational on the device pt plans to use for the visit.  Updated EMR. She had no questions,  verbalized understanding, appreciation. Webex scheduled; e mail sent.

## 2018-09-19 ENCOUNTER — Other Ambulatory Visit: Payer: Self-pay | Admitting: Rheumatology

## 2018-09-21 NOTE — Telephone Encounter (Signed)
Last Visit: 07/23/2018 Next Visit: 12/22/2018  Okay to refill per Dr. Estanislado Pandy.

## 2018-09-23 ENCOUNTER — Encounter: Payer: Self-pay | Admitting: Diagnostic Neuroimaging

## 2018-09-23 ENCOUNTER — Other Ambulatory Visit: Payer: Self-pay

## 2018-09-23 ENCOUNTER — Ambulatory Visit (INDEPENDENT_AMBULATORY_CARE_PROVIDER_SITE_OTHER): Payer: Medicare Other | Admitting: Diagnostic Neuroimaging

## 2018-09-23 ENCOUNTER — Other Ambulatory Visit: Payer: Self-pay | Admitting: Rheumatology

## 2018-09-23 DIAGNOSIS — G629 Polyneuropathy, unspecified: Secondary | ICD-10-CM

## 2018-09-23 MED ORDER — GABAPENTIN 100 MG PO CAPS: 100.0000 mg | ORAL_CAPSULE | Freq: Three times a day (TID) | ORAL | 4 refills | Status: DC

## 2018-09-23 NOTE — Progress Notes (Signed)
Virtual Visit via Video Note  I connected with Felicia [name] on 09/23/18 at  2:45 PM EDT by a video enabled telemedicine application and verified that I am speaking with the correct person using two identifiers.   I discussed the limitations of evaluation and management by telemedicine and the availability of in person appointments. The patient expressed understanding and agreed to proceed.  Patient is at their home. I am at the office.    History of Present Illness:  - doing well; neuropathy pain improved on gabapentin - no major neck pain; arms are stable - no falls or major gait issues    Observations/Objective:   VIDEO EXAM  GENERAL EXAM/CONSTITUTIONAL:  Vitals: There were no vitals filed for this visit.  There is no height or weight on file to calculate BMI. Wt Readings from Last 3 Encounters:  08/01/18 138 lb 4.8 oz (62.7 kg)  07/23/18 134 lb (60.8 kg)  02/25/18 137 lb (62.1 kg)     Patient is in no distress; well developed, nourished and groomed; neck is supple   NEUROLOGIC: MENTAL STATUS:  No flowsheet data found.  awake, alert, oriented to person, place and time  recent and remote memory intact  normal attention and concentration  language fluent, comprehension intact, naming intact  fund of knowledge appropriate  CRANIAL NERVE:   2nd, 3rd, 4th, 6th - visual fields full to confrontation, extraocular muscles intact, no nystagmus  5th - facial sensation symmetric  7th - facial strength symmetric  8th - hearing intact  11th - shoulder shrug symmetric  12th - tongue protrusion midline  MOTOR:   NO TREMOR; NO DRIFT IN BUE  SENSORY:   normal and symmetric to light touch  COORDINATION:   fine finger movements normal   03/26/18 EMG/NCS - Axonal sensorimotor polyneuropathy.  03/10/18 MRI of the cervical spine shows multilevel degenerative changes as detailed above.  The most significant findings are: 1.   At C4-C5 and C5-C6, there  is mild to moderate spinal stenosis.  Additionally, at C5-C6, there is also moderately severe right foraminal narrowing with possible right C6 nerve root compression.  Changes at C5-C6 have slightly progressed when compared to the 07/15/2012 MRI. 2.   At C6-C7, there is mild spinal stenosis but no nerve root compression. 3.   At T1-T2 there is minimal anterolisthesis of T1 over T2 that has progressed since the previous MRI.  There is no nerve root compression or spinal stenosis at this level.    Assessment and Plan:  78 y.o. female here with rheumatoid arthritis, here for evaluation of neuropathy.   Ddx: neuropathy  (RA, metabolic, hereditary, idiopathic), cervical myelopathy, cervical radiculopathy, lumbar radiculopathy   PLAN:  NEUROPATHY (idiopathic vs autoimmune) - continue gabapentin for symptom control; 100mg  at bedtime numbness / pain; may increase up to 300mg  at bedtime  Meds ordered this encounter  Medications   gabapentin (NEURONTIN) 100 MG capsule    Sig: Take 1 capsule (100 mg total) by mouth 3 (three) times daily.    Dispense:  270 capsule    Refill:  4    CERVICAL SPINAL STENOSIS - monitor for now; may consider neurosurgery eval in future   Follow Up Instructions:  Return in about 9 months (around 06/25/2019).    I discussed the assessment and treatment plan with the patient. The patient was provided an opportunity to ask questions and all were answered. The patient agreed with the plan and demonstrated an understanding of the instructions.  The patient was advised to call back or seek an in-person evaluation if the symptoms worsen or if the condition fails to improve as anticipated.  I provided 15 minutes of non-face-to-face time during this encounter.   Penni Bombard, MD 8/61/4830, 7:35 PM Certified in Neurology, Neurophysiology and Neuroimaging  [facility] [address], [place], [place] [address] [phone]

## 2018-09-24 NOTE — Telephone Encounter (Signed)
Last Visit: 07/23/2018 Next Visit: 12/22/2018 Labs: 08/17/18 stable Tb Gold: 11/07/17 Neg   Okay to refill per Dr. Estanislado Pandy

## 2018-09-28 ENCOUNTER — Other Ambulatory Visit: Payer: Self-pay | Admitting: Rheumatology

## 2018-09-28 NOTE — Telephone Encounter (Signed)
Last Visit: 07/23/2018 Next Visit: 12/22/2018  Okay to refill per Dr. Estanislado Pandy.

## 2018-09-29 ENCOUNTER — Ambulatory Visit: Payer: Medicare Other | Admitting: Diagnostic Neuroimaging

## 2018-10-05 ENCOUNTER — Telehealth (INDEPENDENT_AMBULATORY_CARE_PROVIDER_SITE_OTHER): Payer: Medicare Other | Admitting: Rheumatology

## 2018-10-05 ENCOUNTER — Other Ambulatory Visit: Payer: Self-pay

## 2018-10-05 ENCOUNTER — Telehealth: Payer: Self-pay | Admitting: Rheumatology

## 2018-10-05 ENCOUNTER — Encounter: Payer: Self-pay | Admitting: Rheumatology

## 2018-10-05 DIAGNOSIS — Z86010 Personal history of colonic polyps: Secondary | ICD-10-CM

## 2018-10-05 DIAGNOSIS — M5136 Other intervertebral disc degeneration, lumbar region: Secondary | ICD-10-CM

## 2018-10-05 DIAGNOSIS — M19041 Primary osteoarthritis, right hand: Secondary | ICD-10-CM

## 2018-10-05 DIAGNOSIS — Z79899 Other long term (current) drug therapy: Secondary | ICD-10-CM

## 2018-10-05 DIAGNOSIS — M19042 Primary osteoarthritis, left hand: Secondary | ICD-10-CM

## 2018-10-05 DIAGNOSIS — M5030 Other cervical disc degeneration, unspecified cervical region: Secondary | ICD-10-CM

## 2018-10-05 DIAGNOSIS — Z8669 Personal history of other diseases of the nervous system and sense organs: Secondary | ICD-10-CM

## 2018-10-05 DIAGNOSIS — M0579 Rheumatoid arthritis with rheumatoid factor of multiple sites without organ or systems involvement: Secondary | ICD-10-CM | POA: Diagnosis not present

## 2018-10-05 DIAGNOSIS — Z8639 Personal history of other endocrine, nutritional and metabolic disease: Secondary | ICD-10-CM

## 2018-10-05 DIAGNOSIS — Z8719 Personal history of other diseases of the digestive system: Secondary | ICD-10-CM

## 2018-10-05 DIAGNOSIS — M170 Bilateral primary osteoarthritis of knee: Secondary | ICD-10-CM

## 2018-10-05 DIAGNOSIS — G629 Polyneuropathy, unspecified: Secondary | ICD-10-CM

## 2018-10-05 DIAGNOSIS — E222 Syndrome of inappropriate secretion of antidiuretic hormone: Secondary | ICD-10-CM

## 2018-10-05 MED ORDER — PREDNISONE 5 MG PO TABS: ORAL_TABLET | ORAL | 0 refills | Status: DC

## 2018-10-05 NOTE — Telephone Encounter (Signed)
Patient LMOM stating she is having a flare with her hand. It is swollen, painful, and red. This has been going on for two days now. Patient has been using ice, which helps some. Patient questions an RX to be called in. Please call to advise.

## 2018-10-05 NOTE — Telephone Encounter (Signed)
Patient is scheduled for a virtual visit with Dr. Estanislado Pandy today to discuss.

## 2018-10-05 NOTE — Progress Notes (Signed)
Virtual Visit via Video Note  I connected with Felicia [name] on 10/05/18 at 12:15 PM EDT by a video enabled telemedicine application and verified that I am speaking with the correct person using two identifiers.  Location: Patient: Home Provider: At the office    I discussed the limitations of evaluation and management by telemedicine and the availability of in person appointments. The patient expressed understanding and agreed to proceed. This service was conducted via virtual visit.  Both audio and visual tools were used.  The patient was located at home. I was located in my office.  Consent was obtained prior to the virtual visit and is aware of possible charges through their insurance for this visit.  The patient is an established patient.  Dr. Estanislado Pandy, MD conducted the virtual visit and Hazel Sams, PA-C acted as scribe during the service.  Office staff helped with scheduling follow up visits after the service was conducted.     CC: Left hand pain   History of Present Illness: Patient is a 78 year old female with a past medical history of seropositive rheumatoid arthritis, osteoarthritis, and DDD.  She is on Orencia 125 mg sq once weekly injections, MTX 0.8 ml sq once weekly, and folic acid 2 mg po daily.  She is currently having a rheumatoid arthritis flare in the left hand.  She is having pain and swelling.  She has been applying ice and taking tylenol for pain relief.  She has been busy organizing and cleaning at her house, which has caused the flare.   She continues to have lower extremity muscle deconditioning, and she had started going to PT but had to discontinue due to COVID-19.  She has been performing home exercises.    Review of Systems  Constitutional: Positive for malaise/fatigue. Negative for fever.  Eyes: Negative for photophobia, pain, discharge and redness.       +Dry eyes  Respiratory: Negative for cough, shortness of breath and wheezing.   Cardiovascular: Negative for  chest pain and palpitations.  Gastrointestinal: Negative for blood in stool, constipation and diarrhea.  Genitourinary: Negative for dysuria.  Musculoskeletal: Positive for joint pain. Negative for back pain, myalgias and neck pain.       +Joint swelling  +Morning stiffness   Skin: Negative for rash.  Neurological: Negative for dizziness and headaches.  Psychiatric/Behavioral: Negative for depression. The patient is not nervous/anxious and does not have insomnia.       Observations/Objective: Physical Exam  Constitutional: She is oriented to person, place, and time and well-developed, well-nourished, and in no distress.  HENT:  Head: Normocephalic and atraumatic.  Eyes: Conjunctivae are normal.  Pulmonary/Chest: Effort normal.  Neurological: She is alert and oriented to person, place, and time.  Psychiatric: Mood, memory, affect and judgment normal.   Patient reports morning stiffness for 1 hour.   Patient reports nocturnal pain.  Difficulty dressing/grooming: Denies Difficulty climbing stairs: Reports Difficulty getting out of chair: Denies Difficulty using hands for taps, buttons, cutlery, and/or writing: Reports  Left 2nd, 3rd, 4th, and 5th MCP joint swelling and erythema noted   Assessment and Plan: Rheumatoid arthritis involving multiple sites with positive rheumatoid factor (HCC) - RF+, ANA +.  She is currently having a rheumatoid arthritis flare.  She has pain and swelling of the left 2nd, 3rd, 4th, and 5th MCP joints.  She has been performing overuse activities organizing at home.  She is on Orencia 125 mg sq weekly injections, MTX 0.8 ml sq once weekly,  and folic acid 2 mg po daily.  She has not missed any doses recently. She will continue on this current treatment regimen. A refill of MTX will be sent to the pharmacy. She will be started on prednisone 20 mg po daily and she will reduce by 5 mg every 4 days for the current flare.  She was advised to notify us if the joint  pain and joint swelling returns after the taper. She will follow up in 3 months.    High risk medication use - Orencia125 mg sq weekly injections, MTX 0.8 ml sq q wk, and folic acid 2 mg po qd.  (Inadequate response to Humira, Enbrel).  She had CBC and CMP drawn on 08/17/18.  She will be due to update labs in June and every 3 months.  Standing orders are in place. TB gold negative 11/07/17.  A future order for TB gold will be placed today. She was advised to hold Orencia and MTX if she has an infection and to resume once the infection has cleared.  We discussed the importance of social distancing and following the standard precautions recommended made by the CDC.    Primary osteoarthritis of both hands-She is currently having a RA flare in the left hand.  She will be started on a prednisone taper.  Joint protection and muscle strengthening were discussed.   Primary osteoarthritis of both knees-She has no joint pain or joint swelling at this time.    DDD (degenerative disc disease), cervical-She has moderate spinal stenosis based on her recent MRI.  DDD (degenerative disc disease), lumbar-She has intermittent lower back pain.    Neuropathy -Patient had nerve conduction velocity by Dr. Leta Baptist in the past, which showed sensorimotor neuropathy.  She was placed on gabapentin and had some improvement in her symptoms.    Follow Up Instructions: She will follow up in 3 months. A prednisone taper will be sent to the pharmacy.  She will be due for labs in June.  Future orders for TB gold will be placed today.   I discussed the assessment and treatment plan with the patient. The patient was provided an opportunity to ask questions and all were answered. The patient agreed with the plan and demonstrated an understanding of the instructions.   The patient was advised to call back or seek an in-person evaluation if the symptoms worsen or if the condition fails to improve as anticipated.  I provided 25  minutes of non-face-to-face time during this encounter. Bo Merino, MD   Scribed by-  Ofilia Neas, PA-C

## 2018-10-10 ENCOUNTER — Other Ambulatory Visit: Payer: Self-pay | Admitting: Rheumatology

## 2018-10-12 NOTE — Telephone Encounter (Signed)
Last Visit: 10/05/2018 Next Visit: 12/22/2018 Labs: 08/17/2018 stable  Okay to refill per Dr. Estanislado Pandy

## 2018-10-22 ENCOUNTER — Telehealth: Payer: Self-pay | Admitting: Pulmonary Disease

## 2018-10-22 NOTE — Telephone Encounter (Signed)
Spoke with Caryl Pina. She stated that the patient was currently in the office to discuss possibly getting an oral appliance for OSA. She was requesting a copy of the patient's most recent sleep study. Advised her that the one from 2014 was the only one on file. She stated that this would be ok.   Copy of HST has been faxed.   Nothing further needed at time of call.

## 2018-11-12 ENCOUNTER — Other Ambulatory Visit: Payer: Self-pay | Admitting: Internal Medicine

## 2018-11-30 ENCOUNTER — Telehealth: Payer: Self-pay | Admitting: Rheumatology

## 2018-11-30 NOTE — Telephone Encounter (Signed)
Patient advised her standing labs and her TB Gold are due now. Patient advised of lab hours. Patient states she will come this week to update.

## 2018-11-30 NOTE — Telephone Encounter (Signed)
Patient left a voicemail requesting a return call to let her know when her labwork including TB gold is due.

## 2018-12-02 ENCOUNTER — Other Ambulatory Visit: Payer: Self-pay

## 2018-12-02 DIAGNOSIS — Z79899 Other long term (current) drug therapy: Secondary | ICD-10-CM

## 2018-12-03 NOTE — Progress Notes (Signed)
CBC and CMP are stable.  Please forward a copy to her PCP.

## 2018-12-04 LAB — CBC WITH DIFFERENTIAL/PLATELET
Absolute Monocytes: 460 cells/uL (ref 200–950)
Basophils Absolute: 47 cells/uL (ref 0–200)
Basophils Relative: 0.6 %
Eosinophils Absolute: 109 cells/uL (ref 15–500)
Eosinophils Relative: 1.4 %
HCT: 36.6 % (ref 35.0–45.0)
Hemoglobin: 12 g/dL (ref 11.7–15.5)
Lymphs Abs: 1981 cells/uL (ref 850–3900)
MCH: 31 pg (ref 27.0–33.0)
MCHC: 32.8 g/dL (ref 32.0–36.0)
MCV: 94.6 fL (ref 80.0–100.0)
MPV: 9.6 fL (ref 7.5–12.5)
Monocytes Relative: 5.9 %
Neutro Abs: 5203 cells/uL (ref 1500–7800)
Neutrophils Relative %: 66.7 %
Platelets: 426 10*3/uL — ABNORMAL HIGH (ref 140–400)
RBC: 3.87 10*6/uL (ref 3.80–5.10)
RDW: 13.7 % (ref 11.0–15.0)
Total Lymphocyte: 25.4 %
WBC: 7.8 10*3/uL (ref 3.8–10.8)

## 2018-12-04 LAB — COMPLETE METABOLIC PANEL WITH GFR
AG Ratio: 1.7 (calc) (ref 1.0–2.5)
ALT: 12 U/L (ref 6–29)
AST: 19 U/L (ref 10–35)
Albumin: 3.9 g/dL (ref 3.6–5.1)
Alkaline phosphatase (APISO): 58 U/L (ref 37–153)
BUN: 13 mg/dL (ref 7–25)
CO2: 24 mmol/L (ref 20–32)
Calcium: 9.8 mg/dL (ref 8.6–10.4)
Chloride: 100 mmol/L (ref 98–110)
Creat: 0.82 mg/dL (ref 0.60–0.93)
GFR, Est African American: 80 mL/min/{1.73_m2} (ref >=60)
GFR, Est Non African American: 69 mL/min/{1.73_m2} (ref >=60)
Globulin: 2.3 g/dL (calc) (ref 1.9–3.7)
Glucose, Bld: 83 mg/dL (ref 65–99)
Potassium: 5 mmol/L (ref 3.5–5.3)
Sodium: 132 mmol/L — ABNORMAL LOW (ref 135–146)
Total Bilirubin: 0.5 mg/dL (ref 0.2–1.2)
Total Protein: 6.2 g/dL (ref 6.1–8.1)

## 2018-12-04 LAB — QUANTIFERON-TB GOLD PLUS
Mitogen-NIL: 6.57 IU/mL
NIL: 0.02 IU/mL
QuantiFERON-TB Gold Plus: NEGATIVE
TB1-NIL: <0 IU/mL
TB2-NIL: <0 IU/mL

## 2018-12-07 NOTE — Progress Notes (Signed)
TB gold negative

## 2018-12-09 NOTE — Progress Notes (Signed)
Office Visit Note  Patient: Felicia [name]             Date of Birth: Sep 29, 1940           MRN: [id number]             PCP: Kathyrn Lass, MD Referring: Kathyrn Lass, MD Visit Date: 12/22/2018 Occupation: @GUAROCC @  Subjective:  Fatigue     History of Present Illness: Felicia [name] is a 78 y.o. female with history of seropositive rheumatoid arthritis, osteoarthritis, and DDD.  She is on Orencia 125 mg sq every week, MTX 0.8 ml sq once weekly, and folic acid 2 mg po daily.  She has not missed any doses recently.  She denies any recent infections.  She denies any recent rheumatoid arthritis flares.  She has occasional discomfort and stiffness in her knee joints and bilateral shoulder joints.  She states that her stiffness and aching is worse in the morning.  She denies any joint swelling currently.  She has been performing stretching exercises on a regular basis.  She started going to [facility] but had to discontinue due to the COVID-19 pandemic.  She has been performing home exercises.  She has occasional lower back pain but states that performing exercises helps with the stiffness and discomfort.  She reports that overall her pain due to neuropathy and lower back pain has improved since starting on gabapentin.  She has been taking gabapentin 100 mg 1 capsule 3 times daily.  She states she continues to have chronic fatigue.   Activities of Daily Living:  Patient reports morning stiffness for 30 minutes.   Patient Denies nocturnal pain.  Difficulty dressing/grooming: Denies Difficulty climbing stairs: Denies Difficulty getting out of chair: Denies Difficulty using hands for taps, buttons, cutlery, and/or writing: Denies  Review of Systems  Constitutional: Positive for fatigue.  HENT: Positive for mouth dryness. Negative for mouth sores and nose dryness.   Eyes: Positive for dryness (Refresh eyedrops ). Negative for pain and visual disturbance.  Respiratory: Negative for cough,  hemoptysis, shortness of breath and difficulty breathing.   Cardiovascular: Negative for chest pain, palpitations, hypertension and swelling in legs/feet.  Gastrointestinal: Negative for blood in stool, constipation and diarrhea.  Endocrine: Negative for increased urination.  Genitourinary: Negative for painful urination.  Musculoskeletal: Positive for arthralgias, joint pain and morning stiffness. Negative for joint swelling, myalgias, muscle weakness, muscle tenderness and myalgias.  Skin: Negative for color change, pallor, rash, hair loss, nodules/bumps, skin tightness, ulcers and sensitivity to sunlight.  Allergic/Immunologic: Negative for susceptible to infections.  Neurological: Negative for dizziness, numbness, headaches and weakness.  Hematological: Negative for swollen glands.  Psychiatric/Behavioral: Negative for depressed mood and sleep disturbance. The patient is not nervous/anxious.     PMFS History:  Patient Active Problem List   Diagnosis Date Noted  . Thrombocytosis ([place]) 08/01/2018  . URI (upper respiratory infection) 04/14/2017  . Primary osteoarthritis of both knees 03/06/2017  . Exertional chest pain   . Pain in the chest 03/27/2015  . Postural hypotension 05/26/2013  . OSA (obstructive sleep apnea) 03/22/2013  . Osteoarthritis 11/07/2011  . Abdominal bruit 10/16/2011  . HTN (hypertension) 08/13/2011  . Anisocoria 08/13/2011  . Cervical radiculopathy 07/17/2011  . DDD (degenerative disc disease), cervical 07/10/2011  . Glaucoma 06/06/2011  . HIATAL HERNIA 01/30/2010  . POSTMENOPAUSAL SYNDROME 12/14/2009  . EUSTACHIAN TUBE DYSFUNCTION, RIGHT 06/28/2009  . GERD 12/07/2008  . Rheumatoid arthritis ([place]) 12/07/2008  . COLONIC POLYPS, HX OF 12/07/2008  .  HYPERLIPIDEMIA 10/17/2006  . HYPERCALCEMIA 10/17/2006  . MITRAL VALVE PROLAPSE 10/17/2006    Past Medical History:  Diagnosis Date  . Adenomatous colon polyp 2004   Colo in 2009 "no polyps"  . Complication of  anesthesia    small airway per patient  . GERD (gastroesophageal reflux disease)   . Glaucoma   . Hiatal hernia   . Hypertension   . Neuropathy   . Rheumatoid arthritis(714.0)    Orencia; Methotrexate (Dr. Patrecia Pour)  . Sleep apnea    on CPAP    Family History  Problem Relation Age of Onset  . Dementia Mother   . Lung disease Mother        bronchiectasis  . Heart disease Mother        Aortic Stenosis  . Osteoporosis Mother   . Heart attack Father 46       S/P CBAG  . Stroke Maternal Grandmother 83  . Ulcerative colitis Brother   . Diabetes Neg Hx   . Cancer Neg Hx    Past Surgical History:  Procedure Laterality Date  . CARDIAC CATHETERIZATION N/A 03/29/2015   Procedure: Left Heart Cath and Coronary Angiography;  Surgeon: Belva Crome, MD;  Location: [facility];  Service: Cardiovascular;  Laterality: N/A;  . CATARACT EXTRACTION, BILATERAL    . COLONOSCOPY W/ POLYPECTOMY     X1; negative subsequently  . DILATION AND CURETTAGE OF UTERUS    . EYE SURGERY     due to RA  . EYE SURGERY Left 06/2017   eyelid tendon   . HYSTEROSCOPY W/D&C  06/24/2012   Procedure: DILATATION AND CURETTAGE /HYSTEROSCOPY;  Surgeon: [name], MD;  Location: [facility];  Service: Gynecology;  Laterality: N/A;  . SHOULDER SURGERY Left    due to RA  . TRABECULECTOMY     X 2 OD; X 1 OS  . UPPER GASTROINTESTINAL ENDOSCOPY     hiatal hernia   Social History   Social History Narrative  . Not on file   Immunization History  Administered Date(s) Administered  . Influenza Split 03/08/2014  . Influenza Whole 04/07/2007, 02/24/2008, 03/10/2009, 03/03/2012  . Influenza, High Dose Seasonal PF 01/15/2016, 03/07/2017  . Influenza,inj,Quad PF,6+ Mos 03/15/2013, 02/21/2015, 03/17/2018  . PPD Test 03/10/2012  . Pneumococcal Conjugate-13 09/12/2015  . Pneumococcal Polysaccharide-23 03/25/2003, 04/12/2008  . Td 12/14/2009  . Zoster Recombinat (Shingrix) 03/23/2018     Objective: Vital  Signs: BP (!) 149/70 (BP Location: Left Arm, Patient Position: Sitting, Cuff Size: Normal)   Pulse (!) 57   Resp 13   Ht 5' 5.5" (1.664 m)   Wt 137 lb 3.2 oz (62.2 kg)   BMI 22.48 kg/m    Physical Exam Vitals signs and nursing note reviewed.  Constitutional:      Appearance: She is well-developed.  HENT:     Head: Normocephalic and atraumatic.  Eyes:     Conjunctiva/sclera: Conjunctivae normal.  Neck:     Musculoskeletal: Normal range of motion.  Cardiovascular:     Rate and Rhythm: Normal rate and regular rhythm.     Heart sounds: Normal heart sounds.  Pulmonary:     Effort: Pulmonary effort is normal.     Breath sounds: Normal breath sounds.  Abdominal:     General: Bowel sounds are normal.     Palpations: Abdomen is soft.  Lymphadenopathy:     Cervical: No cervical adenopathy.  Skin:    General: Skin is warm and dry.  Capillary Refill: Capillary refill takes less than 2 seconds.  Neurological:     Mental Status: She is alert and oriented to person, place, and time.  Psychiatric:        Behavior: Behavior normal.      Musculoskeletal Exam: C-spine, thoracic spine, lumbar spine good range of motion.  No midline spinal tenderness.  No SI joint tenderness.  Shoulder joints, elbow joints, wrist joints, MCPs, PIPs, DIPs good range of motion with no synovitis.  Complete fist formation bilaterally.  Hip joints, knee joints, ankle joints, MTPs, PIPs, DIPs good range of motion no synovitis.  No warmth or effusion of bilateral knee joints.  No tenderness or swelling of ankle joints.  CDAI Exam: CDAI Score: 1.2  Patient Global: 6 mm; Provider Global: 6 mm Swollen: 0 ; Tender: 0  Joint Exam   No joint exam has been documented for this visit   There is currently no information documented on the homunculus. Go to the Rheumatology activity and complete the homunculus joint exam.  Investigation: No additional findings.  Imaging: No results found.  Recent Labs: Lab  Results  Component Value Date   WBC 7.8 12/02/2018   HGB 12.0 12/02/2018   PLT 426 (H) 12/02/2018   NA 132 (L) 12/02/2018   K 5.0 12/02/2018   CL 100 12/02/2018   CO2 24 12/02/2018   GLUCOSE 83 12/02/2018   BUN 13 12/02/2018   CREATININE 0.82 12/02/2018   BILITOT 0.5 12/02/2018   ALKPHOS 67 08/01/2018   AST 19 12/02/2018   ALT 12 12/02/2018   PROT 6.2 12/02/2018   ALBUMIN 4.0 08/01/2018   CALCIUM 9.8 12/02/2018   GFRAA 80 12/02/2018   QFTBGOLDPLUS NEGATIVE 12/02/2018    Speciality Comments: TB gold neg 11/07/17  Procedures:  No procedures performed Allergies: Sulfonamide derivatives, Amlodipine, Zostavax [zoster vaccine live], and Norvasc [amlodipine besylate]   Assessment / Plan:     Visit Diagnoses: Rheumatoid arthritis involving multiple sites with positive rheumatoid factor ([place]) - RF+, ANA +: She has no synovitis on exam.  She is not any recent rheumatoid arthritis flares.  She experiences aching and stiffness in both shoulder joints in both knee joints first thing in the morning.  Her arthralgias and stiffness improve with activity.  She is clinically doing well on Orencia 125 mg subcutaneous injections every week, methotrexate 0.8 mL subcutaneous injections every week, and folic acid 2 mg by mouth daily.  She will continue on his current treatment regimen.  She does not need any refills at this time.  She was advised to notify us if she develops increased joint pain or joint swelling.  She will follow-up in the office in 5 months.  High risk medication use -Orencia 125 mg every 7 days, methotrexate 0.8 mL every 7 days, and folic acid 1 mg 2 tablets daily.  Last TB: Negative on 12/02/2018 and will monitor yearly.  Most recent CBC/CMP stable on 12/02/2018.  Due for CBC/CMP in October and will monitor every 3 months.  Standing orders placed.  She received the flu vaccine in October and is up-to-date with pneumonia vaccines.  She is received 1 dose of Shingrix vaccine.  (Inadequate  response to Humira, Enbrel).  She was advised to hold Orencia and methotrexate if she develops any signs or symptoms of an infection and to resume once infection is cleared.  We discussed the importance of social distancing and following the standard precautions recommended by the CDC.- Plan: CBC with Differential/Platelet, COMPLETE METABOLIC PANEL WITH  GFR  Primary osteoarthritis of both hands - She has PIP and DIP synovial thickening consistent with osteoarthritis of bilateral hands.  She has complete fist formation bilaterally.  She experiences stiffness in both hands first thing in the morning.  We discussed the importance of performing hand exercises.  Joint protection and muscle strengthening were discussed.  Primary osteoarthritis of both knees -She has good range of motion of bilateral knee joints.  She has no warmth or effusion on exam today.  DDD (degenerative disc disease), cervical -She has good ROM with no discomfort at this time.  She has no symptoms of radiculopathy at this time.   DDD (degenerative disc disease), lumbar -She has intermittent lower back pain.  She is good range of motion exam.  She performs back exercises on a regular basis.  She has no midline spinal tenderness at this time.  Her lower back pain has improved since starting on gabapentin.  Neuropathy - Patient had nerve conduction velocity by Dr. Leta Baptist in the past, which showed sensorimotor neuropathy.  Her neuropathy has improved significantly since starting on gabapentin 100 mg 1 capsule by mouth 3 times daily.  Other medical conditions are listed as follows:   History of sleep apnea  History of hyperlipidemia  SIADH (syndrome of inappropriate ADH production) (HCC)   History of glaucoma   History of colonic polyps   History of gastroesophageal reflux (GERD)   Orders: Orders Placed This Encounter  Procedures  . CBC with Differential/Platelet  . COMPLETE METABOLIC PANEL WITH GFR   No orders of the  defined types were placed in this encounter.   Face-to-face time spent with patient was 30 minutes. Greater than 50% of time was spent in counseling and coordination of care.  Follow-Up Instructions: Return in about 5 months (around 05/24/2019) for Rheumatoid arthritis, Osteoarthritis.   Ofilia Neas, PA-C  Note - This record has been created using Dragon software.  Chart creation errors have been sought, but may not always  have been located. Such creation errors do not reflect on  the standard of medical care.

## 2018-12-15 ENCOUNTER — Other Ambulatory Visit: Payer: Self-pay | Admitting: Rheumatology

## 2018-12-16 NOTE — Telephone Encounter (Addendum)
Too soon to refill.

## 2018-12-18 ENCOUNTER — Other Ambulatory Visit: Payer: Self-pay | Admitting: Rheumatology

## 2018-12-18 ENCOUNTER — Telehealth: Payer: Self-pay | Admitting: Rheumatology

## 2018-12-18 MED ORDER — METHOTREXATE SODIUM CHEMO INJECTION (PF) 50 MG/2ML: INTRAMUSCULAR | 0 refills | Status: DC

## 2018-12-18 NOTE — Telephone Encounter (Signed)
Last Visit: 10/05/2018 telemedicine  Next Visit: 12/22/2018  Okay to refill per Dr. Estanislado Pandy.

## 2018-12-18 NOTE — Telephone Encounter (Signed)
Patient left a voicemail requesting prescription refill of Methotrexate.  Patient states she is due to take her injection on Sunday 12/20/18.  Patient states when she called the pharmacy she was told it was "too soon to refill" and now patient is not sure if she took it incorrectly since she doesn't have any medication left.  Patient is requesting a return call.

## 2018-12-18 NOTE — Telephone Encounter (Signed)
Spoke with patient and patient has been taking the correct amount of medication once weekly. Patient believes she accidentally discarded of a vial when she shouldn't have. Patient requested a refill.   Last Visit: 10/05/2018 telemedicine  Next Visit: 12/22/2018 Labs: 12/02/2018 CBC and CMP are stable  Okay to refill per Dr. Estanislado Pandy.

## 2018-12-22 ENCOUNTER — Encounter: Payer: Self-pay | Admitting: Rheumatology

## 2018-12-22 ENCOUNTER — Other Ambulatory Visit: Payer: Self-pay

## 2018-12-22 ENCOUNTER — Ambulatory Visit (INDEPENDENT_AMBULATORY_CARE_PROVIDER_SITE_OTHER): Payer: Medicare Other | Admitting: Physician Assistant

## 2018-12-22 VITALS — BP 149/70 | HR 57 | Resp 13 | Ht 65.5 in | Wt 137.2 lb

## 2018-12-22 DIAGNOSIS — Z79899 Other long term (current) drug therapy: Secondary | ICD-10-CM

## 2018-12-22 DIAGNOSIS — Z860100 Personal history of colon polyps, unspecified: Secondary | ICD-10-CM

## 2018-12-22 DIAGNOSIS — Z8719 Personal history of other diseases of the digestive system: Secondary | ICD-10-CM

## 2018-12-22 DIAGNOSIS — M0579 Rheumatoid arthritis with rheumatoid factor of multiple sites without organ or systems involvement: Secondary | ICD-10-CM

## 2018-12-22 DIAGNOSIS — M170 Bilateral primary osteoarthritis of knee: Secondary | ICD-10-CM | POA: Diagnosis not present

## 2018-12-22 DIAGNOSIS — G629 Polyneuropathy, unspecified: Secondary | ICD-10-CM

## 2018-12-22 DIAGNOSIS — Z8639 Personal history of other endocrine, nutritional and metabolic disease: Secondary | ICD-10-CM

## 2018-12-22 DIAGNOSIS — M19042 Primary osteoarthritis, left hand: Secondary | ICD-10-CM

## 2018-12-22 DIAGNOSIS — Z8669 Personal history of other diseases of the nervous system and sense organs: Secondary | ICD-10-CM

## 2018-12-22 DIAGNOSIS — M19041 Primary osteoarthritis, right hand: Secondary | ICD-10-CM

## 2018-12-22 DIAGNOSIS — E222 Syndrome of inappropriate secretion of antidiuretic hormone: Secondary | ICD-10-CM

## 2018-12-22 DIAGNOSIS — M51369 Other intervertebral disc degeneration, lumbar region without mention of lumbar back pain or lower extremity pain: Secondary | ICD-10-CM

## 2018-12-22 DIAGNOSIS — M5136 Other intervertebral disc degeneration, lumbar region: Secondary | ICD-10-CM

## 2018-12-22 DIAGNOSIS — M5030 Other cervical disc degeneration, unspecified cervical region: Secondary | ICD-10-CM

## 2018-12-22 NOTE — Patient Instructions (Addendum)
Standing Labs We placed an order today for your standing lab work.    Please come back and get your standing labs in October and every 3 months   We have open lab daily Monday through Thursday from 8:30-12:30 PM and 1:30-4:30 PM and Friday from 8:30-12:30 PM and 1:30 -4:00 PM at the office of Dr. Bo Merino.   You may experience shorter wait times on Monday and Friday afternoons. The office is located at [address], [place], [place], [place] [address] No appointment is necessary.   Labs are drawn by [facility].  You may receive a bill from [facility] for your lab work.  If you wish to have your labs drawn at another location, please call the office 24 hours in advance to send orders.  If you have any questions regarding directions or hours of operation,  please call [phone].   Just as a reminder please drink plenty of water prior to coming for your lab work. Thanks!   Hand Exercises Hand exercises can be helpful for almost anyone. These exercises can strengthen the hands, improve flexibility and movement, and increase blood flow to the hands. These results can make work and daily tasks easier. Hand exercises can be especially helpful for people who have joint pain from arthritis or have nerve damage from overuse (carpal tunnel syndrome). These exercises can also help people who have injured a hand. Exercises Most of these hand exercises are gentle stretching and motion exercises. It is usually safe to do them often throughout the day. Warming up your hands before exercise may help to reduce stiffness. You can do this with gentle massage or by placing your hands in warm water for 10-15 minutes. It is normal to feel some stretching, pulling, tightness, or mild discomfort as you begin new exercises. This will gradually improve. Stop an exercise right away if you feel sudden, severe pain or your pain gets worse. Ask your health care provider which exercises are best for you. Knuckle  bend or "claw" fist 1. Stand or sit with your arm, hand, and all five fingers pointed straight up. Make sure to keep your wrist straight during the exercise. 2. Gently bend your fingers down toward your palm until the tips of your fingers are touching the top of your palm. Keep your big knuckle straight and just bend the small knuckles in your fingers. 3. Hold this position for __________ seconds. 4. Straighten (extend) your fingers back to the starting position. Repeat this exercise 5-10 times with each hand. Full finger fist 1. Stand or sit with your arm, hand, and all five fingers pointed straight up. Make sure to keep your wrist straight during the exercise. 2. Gently bend your fingers into your palm until the tips of your fingers are touching the middle of your palm. 3. Hold this position for __________ seconds. 4. Extend your fingers back to the starting position, stretching every joint fully. Repeat this exercise 5-10 times with each hand. Straight fist 1. Stand or sit with your arm, hand, and all five fingers pointed straight up. Make sure to keep your wrist straight during the exercise. 2. Gently bend your fingers at the big knuckle, where your fingers meet your hand, and the middle knuckle. Keep the knuckle at the tips of your fingers straight and try to touch the bottom of your palm. 3. Hold this position for __________ seconds. 4. Extend your fingers back to the starting position, stretching every joint fully. Repeat this exercise 5-10 times with  each hand. Tabletop 1. Stand or sit with your arm, hand, and all five fingers pointed straight up. Make sure to keep your wrist straight during the exercise. 2. Gently bend your fingers at the big knuckle, where your fingers meet your hand, as far down as you can while keeping the small knuckles in your fingers straight. Think of forming a tabletop with your fingers. 3. Hold this position for __________ seconds. 4. Extend your fingers back  to the starting position, stretching every joint fully. Repeat this exercise 5-10 times with each hand. Finger spread 1. Place your hand flat on a table with your palm facing down. Make sure your wrist stays straight as you do this exercise. 2. Spread your fingers and thumb apart from each other as far as you can until you feel a gentle stretch. Hold this position for __________ seconds. 3. Bring your fingers and thumb tight together again. Hold this position for __________ seconds. Repeat this exercise 5-10 times with each hand. Making circles 1. Stand or sit with your arm, hand, and all five fingers pointed straight up. Make sure to keep your wrist straight during the exercise. 2. Make a circle by touching the tip of your thumb to the tip of your index finger. 3. Hold for __________ seconds. Then open your hand wide. 4. Repeat this motion with your thumb and each finger on your hand. Repeat this exercise 5-10 times with each hand. Thumb motion 1. Sit with your forearm resting on a table and your wrist straight. Your thumb should be facing up toward the ceiling. Keep your fingers relaxed as you move your thumb. 2. Lift your thumb up as high as you can toward the ceiling. Hold for __________ seconds. 3. Bend your thumb across your palm as far as you can, reaching the tip of your thumb for the small finger (pinkie) side of your palm. Hold for __________ seconds. Repeat this exercise 5-10 times with each hand. Grip strengthening  1. Hold a stress ball or other soft ball in the middle of your hand. 2. Slowly increase the pressure, squeezing the ball as much as you can without causing pain. Think of bringing the tips of your fingers into the middle of your palm. All of your finger joints should bend when doing this exercise. 3. Hold your squeeze for __________ seconds, then relax. Repeat this exercise 5-10 times with each hand. Contact a health care provider if:  Your hand pain or discomfort  gets much worse when you do an exercise.  Your hand pain or discomfort does not improve within 2 hours after you exercise. If you have any of these problems, stop doing these exercises right away. Do not do them again unless your health care provider says that you can. Get help right away if:  You develop sudden, severe hand pain or swelling. If this happens, stop doing these exercises right away. Do not do them again unless your health care provider says that you can. This information is not intended to replace advice given to you by your health care provider. Make sure you discuss any questions you have with your health care provider. Document Released: 05/01/2015 Document Revised: 09/10/2018 Document Reviewed: 05/21/2018 Elsevier Patient Education  2020 [employer].

## 2019-01-16 ENCOUNTER — Other Ambulatory Visit: Payer: Self-pay | Admitting: Rheumatology

## 2019-01-18 NOTE — Telephone Encounter (Signed)
Last Visit: 12/22/18 Next Visit: due in December. Message sent to the front to schedule patient.  Labs: 12/02/18 stable TB Gold: 12/02/18 Neg   Okay to refill per Dr. Estanislado Pandy

## 2019-02-23 ENCOUNTER — Other Ambulatory Visit: Payer: Self-pay | Admitting: Rheumatology

## 2019-02-24 ENCOUNTER — Telehealth: Payer: Self-pay | Admitting: Rheumatology

## 2019-02-24 NOTE — Telephone Encounter (Signed)
Patient left a message requesting refill on MTX for next 21 weeks sent to [facility] on [address]. Patient checked with pharmacy, and they did not have authorization. Please call to advise.

## 2019-02-25 MED ORDER — METHOTREXATE SODIUM CHEMO INJECTION (PF) 50 MG/2ML: INTRAMUSCULAR | 0 refills | Status: DC

## 2019-02-25 NOTE — Telephone Encounter (Signed)
Spoke with patient and advised her it is a little early to refill her prescription. Patient states she is out of medication. Patient she states she has only been using 2 injections out of each vial. Patient has not been using the additional 0.4 mL out of each vial with causes her to run out early. Patient advised she should be using that additional 0.4 mL to allow her to have a 90 day supply. Patient verbalized understanding. Patient advised will send in prescription.   Last Visit: 12/22/18 Next Visit: due in December. Message sent to the front to schedule patient.  Labs: 12/02/18 stable  Okay to refill per Dr. Estanislado Pandy

## 2019-02-25 NOTE — Telephone Encounter (Signed)
LMOM for patient to call and schedule follow-up appointment.   °

## 2019-02-25 NOTE — Telephone Encounter (Signed)
Please schedule patient a follow up visit. Patient due December 2020. Thanks!

## 2019-03-01 ENCOUNTER — Telehealth: Payer: Self-pay | Admitting: Rheumatology

## 2019-03-01 NOTE — Telephone Encounter (Signed)
Patient advised that we normally send in the MTX with preservatives so that it can be used multiple times. Patient advised that sometimes the pharmacy can not get it in. Patient advised to contact the pharmacy to see if that is the case. Patient advise if so to let me know so I can adjust her prescription.

## 2019-03-01 NOTE — Telephone Encounter (Signed)
Attempted to contact the patient and left message for patient to call the office.  

## 2019-03-01 NOTE — Telephone Encounter (Signed)
Patient called requesting to speak with Felicia [name] directly regarding her prescription of Methotrexate.  Patient states she spoke with the pharmacist who told her she has preservative free single use vials for Methotrexate and not to use multiple times.  Patient requested a return call.

## 2019-03-12 ENCOUNTER — Telehealth: Payer: Self-pay | Admitting: Rheumatology

## 2019-03-12 NOTE — Telephone Encounter (Signed)
Patient called requesting to speak with Dr. Estanislado Pandy directly regarding her prescription of Methotrexate.  Patient states "she needs clarification why she was put on preservative free."

## 2019-03-12 NOTE — Telephone Encounter (Signed)
I spoke with the patient and clarified the change in her MTX prescription.  I explained that her prescription was not changed to preservative free by out office. It is a matter of what the pharmacy has in stock.  I explained that the preservative free prescription is dispensed for single vial use only.  When she is due for her next refill she would like [place] to send a prescription for MTX with preservatives.  She spoke with her pharmacist, and according to the patient they will be ordering MTX with preservatives for her.  All questions were addressed.

## 2019-03-18 ENCOUNTER — Other Ambulatory Visit: Payer: Self-pay | Admitting: *Deleted

## 2019-03-18 MED ORDER — FOLIC ACID 1 MG PO TABS: 2.0000 mg | ORAL_TABLET | Freq: Every day | ORAL | 3 refills | Status: DC

## 2019-03-18 NOTE — Telephone Encounter (Signed)
Refill request received via fax  Last Visit: 12/22/18 Next Visit: 05/10/19  Okay to refill per Dr. Estanislado Pandy

## 2019-03-31 ENCOUNTER — Other Ambulatory Visit: Payer: Self-pay | Admitting: *Deleted

## 2019-03-31 MED ORDER — METHOTREXATE SODIUM CHEMO INJECTION 50 MG/2ML: 20.0000 mg | INTRAMUSCULAR | 0 refills | Status: DC

## 2019-03-31 NOTE — Telephone Encounter (Signed)
Last Visit: 12/22/18 Next Visit: 05/10/19 Labs: 12/02/18 stable  Patient advised she is due to update and will come this week .  Okay to refill per Dr. Estanislado Pandy

## 2019-04-02 ENCOUNTER — Other Ambulatory Visit: Payer: Self-pay | Admitting: Rheumatology

## 2019-04-02 ENCOUNTER — Other Ambulatory Visit: Payer: Self-pay

## 2019-04-02 DIAGNOSIS — Z79899 Other long term (current) drug therapy: Secondary | ICD-10-CM

## 2019-04-02 NOTE — Telephone Encounter (Signed)
Last Visit:12/22/18 Next Visit:05/10/19 Labs:12/02/18 stable TB Gold: 12/02/18 Neg   Patient to update labs this week.   Okay to refill per Dr. Estanislado Pandy

## 2019-04-03 LAB — CBC WITH DIFFERENTIAL/PLATELET
Absolute Monocytes: 757 cells/uL (ref 200–950)
Basophils Absolute: 69 cells/uL (ref 0–200)
Basophils Relative: 0.8 %
Eosinophils Absolute: 103 cells/uL (ref 15–500)
Eosinophils Relative: 1.2 %
HCT: 36.3 % (ref 35.0–45.0)
Hemoglobin: 12 g/dL (ref 11.7–15.5)
Lymphs Abs: 1926 cells/uL (ref 850–3900)
MCH: 31.2 pg (ref 27.0–33.0)
MCHC: 33.1 g/dL (ref 32.0–36.0)
MCV: 94.3 fL (ref 80.0–100.0)
MPV: 9.3 fL (ref 7.5–12.5)
Monocytes Relative: 8.8 %
Neutro Abs: 5745 cells/uL (ref 1500–7800)
Neutrophils Relative %: 66.8 %
Platelets: 514 10*3/uL — ABNORMAL HIGH (ref 140–400)
RBC: 3.85 10*6/uL (ref 3.80–5.10)
RDW: 13.4 % (ref 11.0–15.0)
Total Lymphocyte: 22.4 %
WBC: 8.6 10*3/uL (ref 3.8–10.8)

## 2019-04-03 LAB — COMPLETE METABOLIC PANEL WITH GFR
AG Ratio: 1.6 (calc) (ref 1.0–2.5)
ALT: 13 U/L (ref 6–29)
AST: 19 U/L (ref 10–35)
Albumin: 4 g/dL (ref 3.6–5.1)
Alkaline phosphatase (APISO): 65 U/L (ref 37–153)
BUN: 13 mg/dL (ref 7–25)
CO2: 22 mmol/L (ref 20–32)
Calcium: 10 mg/dL (ref 8.6–10.4)
Chloride: 97 mmol/L — ABNORMAL LOW (ref 98–110)
Creat: 0.78 mg/dL (ref 0.60–0.93)
GFR, Est African American: 85 mL/min/{1.73_m2} (ref >=60)
GFR, Est Non African American: 73 mL/min/{1.73_m2} (ref >=60)
Globulin: 2.5 g/dL (calc) (ref 1.9–3.7)
Glucose, Bld: 97 mg/dL (ref 65–99)
Potassium: 6.3 mmol/L (ref 3.5–5.3)
Sodium: 129 mmol/L — ABNORMAL LOW (ref 135–146)
Total Bilirubin: 0.4 mg/dL (ref 0.2–1.2)
Total Protein: 6.5 g/dL (ref 6.1–8.1)

## 2019-04-05 NOTE — Progress Notes (Signed)
Potassium is elevated-6.3.  It does that appear that she is taking a potassium supplement.  Please notify patient ASAP.  Please forward labs to PCP urgently.   Sodium and chloride are low.  Rest of CMP WNL.  Plts are elevated but stable.

## 2019-05-06 NOTE — Progress Notes (Signed)
Virtual Visit via Video Note  I connected with Felicia [name] on 05/10/19 at 10:30 AM EST by a video enabled telemedicine application and verified that I am speaking with the correct person using two identifiers.  Location: Patient: Home  Provider: Clinic  This service was conducted via virtual visit.  Both audio and visual tools were used.  The patient was located at home. I was located in my office.  Consent was obtained prior to the virtual visit and is aware of possible charges through their insurance for this visit.  The patient is an established patient.  Dr. Estanislado Pandy, MD conducted the virtual visit and Hazel Sams, PA-C acted as scribe during the service.  Office staff helped with scheduling follow up visits after the service was conducted.   I discussed the limitations of evaluation and management by telemedicine and the availability of in person appointments. The patient expressed understanding and agreed to proceed.  CC: Left trochanteric bursitis  History of Present Illness: Patient is a 78 year old female with past medical history of seropositive rheumatoid arthritis. She is on MTX 0.8 ml sq once weekly, folic acid 2 mg po daily, and Orencia 125 mg sq weekly injections.  She has not missed any doses recently. She has not had any recent rheumatoid arthritis flares.  She states in September she was performing home exercises and developed discomfort in her left hip.  She was evaluated by Dr. Hal Morales, and she was diagnosed with left trochanteric bursitis. She had a left trochanteric bursa cortisone injection on 02/28/19, which has provided temporary relief.  She has been performing stretching exercises. She is having less difficulty climbing steps and walking.      Review of Systems  Constitutional: Positive for malaise/fatigue. Negative for fever.  HENT:       +Dry mouth  Eyes: Negative for photophobia, pain, discharge and redness.       +Dry eyes (refresh)  Respiratory: Negative for  cough, shortness of breath and wheezing.   Cardiovascular: Negative for chest pain and palpitations.  Gastrointestinal: Negative for blood in stool, constipation and diarrhea.  Genitourinary: Negative for dysuria.  Musculoskeletal: Positive for joint pain. Negative for back pain, myalgias and neck pain.       +Morning stiffness   Skin: Negative for rash.  Neurological: Negative for dizziness and headaches.  Psychiatric/Behavioral: Negative for depression. The patient is not nervous/anxious and does not have insomnia.    She currently rates her RA a 4/10.   Observations/Objective: Physical Exam  Constitutional: She is oriented to person, place, and time.  Neurological: She is alert and oriented to person, place, and time.  Psychiatric: Mood, memory, affect and judgment normal.    Patient reports morning stiffness for 20-30 minutes.   Patient denies nocturnal pain.  Difficulty dressing/grooming: Denies Difficulty climbing stairs: Reports Difficulty getting out of chair: Reports Difficulty using hands for taps, buttons, cutlery, and/or writing: Denies   Assessment and Plan: Visit Diagnoses: Rheumatoid arthritis involving multiple sites with positive rheumatoid factor ([place]) - RF+, ANA +: She has not had any recent rheumatoid arthritis flares.  She has no joint pain or joint swelling currently.  She is clinically doing well on Orenica 125 mg sq injections every week, MTX 0.8 ml sq once weekly, and folic acid 2 mg po daily.  She will continue on this current treatment regimen.  She does not need any refills at this time.  She was advised to notify us if she develops increased joint pain  or joint swelling. She will follow up in 3-4 months.    High risk medication use -Orencia 125 mg every 7 days, methotrexate 0.8 mL every 7 days, and folic acid 1 mg 2 tablets daily.  Last TB: Negative on 12/02/2018 and will monitor yearly.  CBC and CMP were drawn on 04/02/19.  She will be due to update lab work  on January and every 3 months.   Primary osteoarthritis of both hands - She has no increased pain in both hands.  No joint swelling currently. She is able to make a complete fist but has decreased grip strength. Joint protection and muscle strengthening were discussed.   Primary osteoarthritis of both knees -She has intermittent pain in both knee joints. No joint swelling. She has no difficulty climbing steps. She is performing home exercises at home.   Trochanteric bursitis, left hip: She was diagnosed with left trochanteric bursitis by Dr. Layne Benton in September 2020.  She has been performing home exercises on a regular basis and started to have discomfort on the side of the left hip.  She had a cortisone injection on 02/28/19, which has provided significant relief.  She has been performing home exercises which has helped. Home exercises were discussed today.   DDD (degenerative disc disease), cervical -She has no increased neck pain or stiffness at this time.   DDD (degenerative disc disease), lumbar -She has intermittent lower back pain. She uses a heating pad as needed for pain relief.   Neuropathy - Patient had nerve conduction velocity by Dr. Leta Baptist in the past, which showed sensorimotor neuropathy.  Her neuropathy has improved significantly since starting on gabapentin 100 mg 1 capsule by mouth 3 times daily.   Other medical conditions are listed as follows:   History of sleep apnea  History of hyperlipidemia  SIADH (syndrome of inappropriate ADH production) (HCC)   History of glaucoma   History of colonic polyps   History of gastroesophageal reflux (GERD)   Follow Up Instructions: She will follow up in 3-4 months.    I discussed the assessment and treatment plan with the patient. The patient was provided an opportunity to ask questions and all were answered. The patient agreed with the plan and demonstrated an understanding of the instructions.   The patient was  advised to call back or seek an in-person evaluation if the symptoms worsen or if the condition fails to improve as anticipated.  I provided 25 minutes of non-face-to-face time during this encounter.   Bo Merino, MD   Scribed by-  Hazel Sams, PA-C

## 2019-05-10 ENCOUNTER — Telehealth (INDEPENDENT_AMBULATORY_CARE_PROVIDER_SITE_OTHER): Payer: Medicare Other | Admitting: Rheumatology

## 2019-05-10 ENCOUNTER — Other Ambulatory Visit: Payer: Self-pay

## 2019-05-10 ENCOUNTER — Encounter: Payer: Self-pay | Admitting: Rheumatology

## 2019-05-10 DIAGNOSIS — M170 Bilateral primary osteoarthritis of knee: Secondary | ICD-10-CM | POA: Diagnosis not present

## 2019-05-10 DIAGNOSIS — Z8669 Personal history of other diseases of the nervous system and sense organs: Secondary | ICD-10-CM

## 2019-05-10 DIAGNOSIS — E222 Syndrome of inappropriate secretion of antidiuretic hormone: Secondary | ICD-10-CM

## 2019-05-10 DIAGNOSIS — M19041 Primary osteoarthritis, right hand: Secondary | ICD-10-CM | POA: Diagnosis not present

## 2019-05-10 DIAGNOSIS — Z8719 Personal history of other diseases of the digestive system: Secondary | ICD-10-CM

## 2019-05-10 DIAGNOSIS — Z860100 Personal history of colon polyps, unspecified: Secondary | ICD-10-CM

## 2019-05-10 DIAGNOSIS — G629 Polyneuropathy, unspecified: Secondary | ICD-10-CM

## 2019-05-10 DIAGNOSIS — Z8639 Personal history of other endocrine, nutritional and metabolic disease: Secondary | ICD-10-CM

## 2019-05-10 DIAGNOSIS — M51369 Other intervertebral disc degeneration, lumbar region without mention of lumbar back pain or lower extremity pain: Secondary | ICD-10-CM

## 2019-05-10 DIAGNOSIS — M19042 Primary osteoarthritis, left hand: Secondary | ICD-10-CM

## 2019-05-10 DIAGNOSIS — M0579 Rheumatoid arthritis with rheumatoid factor of multiple sites without organ or systems involvement: Secondary | ICD-10-CM | POA: Diagnosis not present

## 2019-05-10 DIAGNOSIS — M7062 Trochanteric bursitis, left hip: Secondary | ICD-10-CM

## 2019-05-10 DIAGNOSIS — Z79899 Other long term (current) drug therapy: Secondary | ICD-10-CM

## 2019-05-10 DIAGNOSIS — M5030 Other cervical disc degeneration, unspecified cervical region: Secondary | ICD-10-CM

## 2019-05-10 DIAGNOSIS — M5136 Other intervertebral disc degeneration, lumbar region: Secondary | ICD-10-CM

## 2019-05-20 ENCOUNTER — Telehealth: Payer: Self-pay | Admitting: *Deleted

## 2019-05-20 NOTE — Telephone Encounter (Signed)
Received fax from [facility] stating patient requested gabapentin Rx be sent to them. I called patient and informed her. She stated that her insurance is changing form [employer] to [employer], however she wants to check price with [facility] vs [employer] before changing to their pharmacy. She will call back if needed,  verbalized understanding, appreciation.

## 2019-05-26 ENCOUNTER — Telehealth: Payer: Self-pay | Admitting: Rheumatology

## 2019-05-26 MED ORDER — ORENCIA 125 MG/ML ~~LOC~~ SOSY: PREFILLED_SYRINGE | SUBCUTANEOUS | 0 refills | Status: DC

## 2019-05-26 NOTE — Telephone Encounter (Signed)
Patient needs refill on Orencia 125 mg .Fax# [phone]

## 2019-05-26 NOTE — Telephone Encounter (Signed)
Last Visit: 05/10/2019 telemedicine  Next Visit: 09/13/2019 Labs: 04/02/2019 Potassium is elevated-6.3. Sodium and chloride are low. Rest of CMP WNL. Plts are elevated but stable.  TB Gold: 12/02/2018 negative   Okay to refill per Dr. Estanislado Pandy.

## 2019-06-06 NOTE — Progress Notes (Signed)
Cardiology Office Note:    Date:  06/07/2019   ID:  Felicia [name], DOB 1941-05-20, MRN [id number]  PCP:  Kathyrn Lass, MD  Cardiologist:  No primary care provider on file.   Referring MD: Kathyrn Lass, MD   Chief Complaint  Patient presents with  . Coronary Artery Disease  . Chest Pain    History of Present Illness:    Felicia [name] is a 79 y.o. female with a hx of RA, OSA with CPAP, hypertension, GERD, and prior non-obstructive CAD.  Had coronary angiography in 2016 that demonstrated 45% proximal LAD.  In October was complaining of continuous left chest heaviness.  She states this was also associated with tenderness in the chest.  Over the past 2 months, the discomfort has abated.  The discomfort was never exertional.  She denies shortness of breath.  Physical activity is somewhat limited by rheumatoid arthritis.  Past Medical History:  Diagnosis Date  . Adenomatous colon polyp 2004   Colo in 2009 "no polyps"  . Complication of anesthesia    small airway per patient  . GERD (gastroesophageal reflux disease)   . Glaucoma   . Hiatal hernia   . Hypertension   . Neuropathy   . Rheumatoid arthritis(714.0)    Orencia; Methotrexate (Dr. Patrecia Pour)  . Sleep apnea    on CPAP    Past Surgical History:  Procedure Laterality Date  . CARDIAC CATHETERIZATION N/A 03/29/2015   Procedure: Left Heart Cath and Coronary Angiography;  Surgeon: Belva Crome, MD;  Location: [facility];  Service: Cardiovascular;  Laterality: N/A;  . CATARACT EXTRACTION, BILATERAL    . COLONOSCOPY W/ POLYPECTOMY     X1; negative subsequently  . DILATION AND CURETTAGE OF UTERUS    . EYE SURGERY     due to RA  . EYE SURGERY Left 06/2017   eyelid tendon   . HYSTEROSCOPY WITH D & C  06/24/2012   Procedure: DILATATION AND CURETTAGE /HYSTEROSCOPY;  Surgeon: [name], MD;  Location: [facility];  Service: Gynecology;  Laterality: N/A;  . SHOULDER SURGERY Left    due to RA  . TRABECULECTOMY     X 2 OD; X 1 OS  . UPPER GASTROINTESTINAL ENDOSCOPY     hiatal hernia    Current Medications: Current Meds  Medication Sig  . Abatacept (ORENCIA) 125 MG/ML SOSY INJECT 125MG  SUBCUTANEOUSLY WEEKLY  . Calcium Carbonate-Vitamin D (CALTRATE 600+D) 600-400 MG-UNIT per tablet Take 2 tablets by mouth daily.   . Carboxymethylcellulose Sodium (REFRESH OP) Apply 1 drop to eye daily as needed (FOR DRY EYES).   . Cholecalciferol (VITAMIN D3) 1000 UNITS CAPS Take 1 capsule by mouth daily.   . Cyanocobalamin (VITAMIN B-12) 2500 MCG SUBL Place under the tongue.  Marland Kitchen esomeprazole (NEXIUM) 40 MG capsule TAKE ONE CAPSULE BY MOUTH TWICE DAILY BEFORE A MEAL  . estradiol (ESTRACE) 1 MG tablet Take 1 mg by mouth daily.  . folic acid (FOLVITE) 1 MG tablet Take 2 tablets (2 mg total) by mouth daily.  Marland Kitchen gabapentin (NEURONTIN) 100 MG capsule Take 1 capsule (100 mg total) by mouth 3 (three) times daily.  Astrid Drafts Omega-3 300 MG CAPS Take 1 capsule by mouth daily.  Marland Kitchen latanoprost (XALATAN) 0.005 % ophthalmic solution Place 1 drop at bedtime into the right eye.   . losartan (COZAAR) 100 MG tablet Take 100 mg by mouth daily.  . methotrexate 50 MG/2ML injection Inject 0.8 mLs (20 mg total) into the  skin once a week.  . metoprolol (LOPRESSOR) 50 MG tablet Take 50 mg by mouth daily.   . metroNIDAZOLE (METROGEL) 0.75 % gel   . metroNIDAZOLE (METROGEL) 1 % gel Apply 1 application topically 2 (two) times daily.   . MULTIPLE VITAMIN PO Take by mouth.  . progesterone (PROMETRIUM) 200 MG capsule Take 200 mg by mouth daily.  . timolol (TIMOPTIC) 0.5 % ophthalmic solution Place 1 drop into both eyes daily.   . TUBERCULIN SYR 1CC/27GX1/2" (B-D TB SYRINGE 1CC/27GX1/2") 27G X 1/2" 1 ML MISC FOR USE WITH METHOTREXATE  . TURMERIC PO Take by mouth daily as needed.   . valACYclovir (VALTREX) 1000 MG tablet Take 1,000 mg by mouth as needed (fever blister). Reported on 06/14/2015  . Wheat Dextrin (BENEFIBER DRINK MIX PO) Take at bedtime  by mouth.     Allergies:   Sulfonamide derivatives, Amlodipine, Zostavax [zoster vaccine live], and Norvasc [amlodipine besylate]   Social History   Socioeconomic History  . Marital status: Single    Spouse name: Not on file  . Number of children: 0  . Years of education: Not on file  . Highest education level: Not on file  Occupational History  . 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 Smoking status: Former Smoker    Packs/day: 0.50    Years: 5.00    Pack years: 2.50    Types: Cigarettes    Quit date: 06/04/1971    Years since quitting: 48.0  . Smokeless tobacco: Never Used  . Tobacco comment: smoked about 3-5 years , up to 1/2 ppd  Substance and Sexual Activity  . Alcohol use: No  . Drug use: No  . Sexual activity: Yes    Birth control/protection: Post-menopausal  Other Topics Concern  . Not on file  Social History Narrative  . Not on file   Social Determinants of Health   Financial Resource Strain:   . Difficulty of Paying Living Expenses: Not on file  Food Insecurity:   . Worried About Charity fundraiser in the Last Year: Not on file  . Ran Out of Food in the Last Year: Not on file  Transportation Needs:   . Lack of Transportation (Medical): Not on file  . Lack of Transportation (Non-Medical): Not on file  Physical Activity:   . Days of Exercise per Week: Not on file  . Minutes of Exercise per Session: Not on file  Stress:   . Feeling of Stress : Not on file  Social Connections:   . Frequency of Communication with Friends and Family: Not on file  . Frequency of Social Gatherings with Friends and Family: Not on file  . Attends Religious Services: Not on file  . Active Member of Clubs or Organizations: Not on file  . Attends Archivist Meetings: Not on file  . Marital Status: Not on file     Family History: The patient's family history includes Dementia in her  mother; Heart attack (age of onset: 11) in her father; Heart disease in her mother; Lung disease in her mother; Osteoporosis in her mother; Stroke (age of onset: 59) in her maternal grandmother; Ulcerative colitis in her brother. There is no history of Diabetes or Cancer.  ROS:   Please see the history of present illness.    Difficulty with ambulation related to rheumatoid arthritis.  Difficulty with memory.  All other systems reviewed and are negative.  EKGs/Labs/Other Studies Reviewed:    The following studies were reviewed today: Coronary angiography October 2016: Diagnostic Dominance: Right    Past lipid panels from 2010-2014: Results for Flott, Danyia E "Shanai Koepp" (MRN [id number]) as of 06/07/2019 11:50  Ref. Range 03/10/2009 08:17 12/14/2009 12:06 09/02/2012 08:26  Total CHOL/HDL Ratio Unknown 3 3 3   Cholesterol Latest Ref Range: 0 - 200 mg/dL 155 238 (H) 200  HDL Cholesterol Latest Ref Range: >39.00 mg/dL 50.50 73.20 69.70  LDL (calc) Latest Ref Range: 0 - 99 mg/dL 80  110 (H)  Direct LDL Latest Units: mg/dL  160.4   Triglycerides Latest Ref Range: 0.0 - 149.0 mg/dL 125.0 81.0 104.0  VLDL Latest Ref Range: 0.0 - 40.0 mg/dL 25.0 16.2 20.8    EKG:  EKG ECG reveals a normal pattern.  Rate 73 bpm.  Recent Labs: 04/02/2019: ALT 13; BUN 13; Creat 0.78; Hemoglobin 12.0; Platelets 514; Potassium 6.3; Sodium 129  Recent Lipid Panel    Component Value Date/Time   CHOL 200 09/02/2012 0826   TRIG 104.0 09/02/2012 0826   HDL 69.70 09/02/2012 0826   CHOLHDL 3 09/02/2012 0826   VLDL 20.8 09/02/2012 0826   LDLCALC 110 (H) 09/02/2012 0826   LDLDIRECT 160.4 12/14/2009 1206    Physical Exam:    VS:  BP (!) 144/74   Pulse 73   Ht 5' 5.5" (1.664 m)   Wt 136 lb 9.6 oz (62 kg)   SpO2 100%   BMI 22.39 kg/m     Wt Readings from Last 3 Encounters:  06/07/19 136 lb 9.6 oz (62 kg)  12/22/18 137 lb 3.2 oz (62.2 kg)  08/01/18 138 lb 4.8 oz (62.7 kg)     GEN: Slender and healthy  appearing. No acute distress HEENT: Normal NECK: No JVD. LYMPHATICS: No lymphadenopathy CARDIAC:  RRR without murmur, gallop, or edema. VASCULAR:  Normal Pulses. No bruits. RESPIRATORY:  Clear to auscultation without rales, wheezing or rhonchi  ABDOMEN: Soft, non-tender, non-distended, No pulsatile mass, MUSCULOSKELETAL: No deformity  SKIN: Warm and dry NEUROLOGIC:  Alert and oriented x 3 PSYCHIATRIC:  Normal affect   ASSESSMENT:    1. Coronary artery disease involving native coronary artery of native heart without angina pectoris   2. Hyperlipidemia LDL goal <70   3. Essential hypertension   4. Mitral valve disease    PLAN:    In order of problems listed above:  1. She has known nonobstructive coronary disease documented by cath in 2016 we discussed primary prevention since she has not had an event.  She needs more physical activity if possible, but has limitations related to rheumatoid arthritis, better systolic blood pressure control to less than 130, and LDL less than 70. 2. Check lipid panel today along with a hs-CRP 3. Low-salt diet.  Consider increasing antihypertensive intensity to ensure systolic blood pressures less than 130. 4. No auscultatory evidence of mitral valve disease. 5. 3W's discussed to avoid COVID-19 infection.  Overall education and awareness concerning primary risk prevention was discussed in detail: LDL less than 70, hemoglobin A1c less than 7, blood pressure target less than 130/80 mmHg, >150 minutes of moderate aerobic activity per week, avoidance of smoking, weight control (via diet and exercise), and continued surveillance/management of/for obstructive sleep apnea.    Medication Adjustments/Labs and Tests Ordered: Current medicines are reviewed at length with the patient today.  Concerns regarding medicines are outlined above.  Orders Placed This Encounter  Procedures  . Lipid panel  . C-reactive protein  .  EKG 12-Lead   No orders of the defined  types were placed in this encounter.   Patient Instructions  Medication Instructions:  Your physician recommends that you continue on your current medications as directed. Please refer to the Current Medication list given to you today.  *If you need a refill on your cardiac medications before your next appointment, please call your pharmacy*  Lab Work: Lipid panel and CRP today  If you have labs (blood work) drawn today and your tests are completely normal, you will receive your results only by: Marland Kitchen MyChart Message (if you have MyChart) OR . A paper copy in the mail If you have any lab test that is abnormal or we need to change your treatment, we will call you to review the results.  Testing/Procedures: None  Follow-Up: At [facility], you and your health needs are our priority.  As part of our continuing mission to provide you with exceptional heart care, we have created designated Provider Care Teams.  These Care Teams include your primary Cardiologist (physician) and Advanced Practice Providers (APPs -  Physician Assistants and Nurse Practitioners) who all work together to provide you with the care you need, when you need it.  Your next appointment:   As needed   The format for your next appointment:   In Person  Provider:   You may see Dr. Daneen Schick or one of the following Advanced Practice Providers on your designated Care Team:    Truitt Merle, NP  Cecilie Kicks, NP  Kathyrn Drown, NP   Other Instructions      Signed, Sinclair Grooms, MD  06/07/2019 12:01 PM    [place]

## 2019-06-07 ENCOUNTER — Encounter: Payer: Self-pay | Admitting: Interventional Cardiology

## 2019-06-07 ENCOUNTER — Ambulatory Visit: Payer: Medicare PPO | Admitting: Interventional Cardiology

## 2019-06-07 ENCOUNTER — Other Ambulatory Visit: Payer: Self-pay

## 2019-06-07 VITALS — BP 144/74 | HR 73 | Ht 65.5 in | Wt 136.6 lb

## 2019-06-07 DIAGNOSIS — Z7189 Other specified counseling: Secondary | ICD-10-CM

## 2019-06-07 DIAGNOSIS — E785 Hyperlipidemia, unspecified: Secondary | ICD-10-CM

## 2019-06-07 DIAGNOSIS — I10 Essential (primary) hypertension: Secondary | ICD-10-CM

## 2019-06-07 DIAGNOSIS — I059 Rheumatic mitral valve disease, unspecified: Secondary | ICD-10-CM

## 2019-06-07 DIAGNOSIS — I2510 Atherosclerotic heart disease of native coronary artery without angina pectoris: Secondary | ICD-10-CM | POA: Diagnosis not present

## 2019-06-07 NOTE — Patient Instructions (Signed)
Medication Instructions:  Your physician recommends that you continue on your current medications as directed. Please refer to the Current Medication list given to you today.  *If you need a refill on your cardiac medications before your next appointment, please call your pharmacy*  Lab Work: Lipid panel and CRP today  If you have labs (blood work) drawn today and your tests are completely normal, you will receive your results only by: Marland Kitchen MyChart Message (if you have MyChart) OR . A paper copy in the mail If you have any lab test that is abnormal or we need to change your treatment, we will call you to review the results.  Testing/Procedures: None  Follow-Up: At [facility], you and your health needs are our priority.  As part of our continuing mission to provide you with exceptional heart care, we have created designated Provider Care Teams.  These Care Teams include your primary Cardiologist (physician) and Advanced Practice Providers (APPs -  Physician Assistants and Nurse Practitioners) who all work together to provide you with the care you need, when you need it.  Your next appointment:   As needed   The format for your next appointment:   In Person  Provider:   You may see Dr. Daneen Schick or one of the following Advanced Practice Providers on your designated Care Team:    Truitt Merle, NP  Cecilie Kicks, NP  Kathyrn Drown, NP   Other Instructions

## 2019-06-08 ENCOUNTER — Telehealth: Payer: Self-pay | Admitting: Rheumatology

## 2019-06-08 LAB — LIPID PANEL
Chol/HDL Ratio: 3.4 ratio (ref 0.0–4.4)
Cholesterol, Total: 206 mg/dL — ABNORMAL HIGH (ref 100–199)
HDL: 61 mg/dL (ref >39)
LDL Chol Calc (NIH): 119 mg/dL — ABNORMAL HIGH (ref 0–99)
Triglycerides: 148 mg/dL (ref 0–149)
VLDL Cholesterol Cal: 26 mg/dL (ref 5–40)

## 2019-06-08 LAB — C-REACTIVE PROTEIN: CRP: <1 mg/L (ref 0–10)

## 2019-06-08 MED ORDER — "TUBERCULIN SYRINGE 27G X 1/2"" 1 ML MISC": 2 refills | Status: DC

## 2019-06-08 NOTE — Telephone Encounter (Signed)
Patient left a voicemail requesting prescription refill of syringes to [facility] at [address].  Patient states she has changed insurance providers from [facility] to [place].

## 2019-06-08 NOTE — Telephone Encounter (Signed)
Last Visit: 05/10/2019 telemedicine  Next Visit: 09/13/2019  Okay to refill per Dr. Estanislado Pandy.   Pharmacy will obtain new insurance information from patient to process.

## 2019-06-10 ENCOUNTER — Telehealth: Payer: Self-pay | Admitting: *Deleted

## 2019-06-10 DIAGNOSIS — E785 Hyperlipidemia, unspecified: Secondary | ICD-10-CM

## 2019-06-10 MED ORDER — ROSUVASTATIN CALCIUM 10 MG PO TABS: 10.0000 mg | ORAL_TABLET | Freq: Every day | ORAL | 3 refills | Status: DC

## 2019-06-10 NOTE — Telephone Encounter (Signed)
-----   Message from Belva Crome, MD sent at 06/09/2019  8:58 AM EST ----- Let the patient know cholesterol is too high given what we know about the LAD from heart cath several years ago.  LDL is 119 and needs to be less than 70.  I would recommend low to moderate intensity statin therapy if she is able to tolerate it.  Recommend starting rosuvastatin 10 mg daily.  Repeat lipid and liver panel in 6 weeks A copy will be sent to Kathyrn Lass, MD

## 2019-06-10 NOTE — Telephone Encounter (Signed)
Spoke with pt and reviewed results and recommendations.  Pt agreeable to try Rosuvastatin.  She will come for labs on 07/22/19.  Advised pt of what to watch for in regards to symptoms and when to call the office.  Pt verbalized understanding and was in agreement with this plan.

## 2019-06-21 ENCOUNTER — Telehealth: Payer: Self-pay | Admitting: Rheumatology

## 2019-06-21 NOTE — Telephone Encounter (Signed)
Patient called stating she received a letter from [facility] on 06/06/19 stating her prescription of Orencia is not on their list of covered medications.  Patient states a prior authorization is required.  Patient states she spoke with Randall Hiss from [facility] who told her she should request a 3 month supply to be sent to her home.  Patient states she is planning to come in the office on Thursday, 06/24/19 afternoon for labwork.

## 2019-06-21 NOTE — Telephone Encounter (Signed)
Submitted a Prior Authorization request to [employer] for [facility] via Cover My Meds. Will update once we receive a response.  KeyDelanna Notice - PA Case ID: [id number]   Mariella Saa, PharmD, Para [place], CPP Clinical Specialty Pharmacist [phone]  06/21/2019 12:01 PM

## 2019-06-22 ENCOUNTER — Other Ambulatory Visit: Payer: Self-pay

## 2019-06-22 ENCOUNTER — Encounter: Payer: Self-pay | Admitting: Rheumatology

## 2019-06-22 ENCOUNTER — Encounter: Payer: Self-pay | Admitting: Pharmacist

## 2019-06-22 ENCOUNTER — Ambulatory Visit: Payer: Medicare PPO | Admitting: Rheumatology

## 2019-06-22 ENCOUNTER — Ambulatory Visit: Payer: Self-pay

## 2019-06-22 VITALS — BP 142/73 | HR 65 | Resp 12 | Ht 65.5 in | Wt 139.4 lb

## 2019-06-22 DIAGNOSIS — M19041 Primary osteoarthritis, right hand: Secondary | ICD-10-CM | POA: Diagnosis not present

## 2019-06-22 DIAGNOSIS — Z8669 Personal history of other diseases of the nervous system and sense organs: Secondary | ICD-10-CM

## 2019-06-22 DIAGNOSIS — M19042 Primary osteoarthritis, left hand: Secondary | ICD-10-CM

## 2019-06-22 DIAGNOSIS — M170 Bilateral primary osteoarthritis of knee: Secondary | ICD-10-CM

## 2019-06-22 DIAGNOSIS — M0579 Rheumatoid arthritis with rheumatoid factor of multiple sites without organ or systems involvement: Secondary | ICD-10-CM | POA: Diagnosis not present

## 2019-06-22 DIAGNOSIS — Z860100 Personal history of colon polyps, unspecified: Secondary | ICD-10-CM

## 2019-06-22 DIAGNOSIS — M25561 Pain in right knee: Secondary | ICD-10-CM

## 2019-06-22 DIAGNOSIS — G8929 Other chronic pain: Secondary | ICD-10-CM | POA: Diagnosis not present

## 2019-06-22 DIAGNOSIS — M5136 Other intervertebral disc degeneration, lumbar region: Secondary | ICD-10-CM

## 2019-06-22 DIAGNOSIS — Z8719 Personal history of other diseases of the digestive system: Secondary | ICD-10-CM

## 2019-06-22 DIAGNOSIS — Z8639 Personal history of other endocrine, nutritional and metabolic disease: Secondary | ICD-10-CM

## 2019-06-22 DIAGNOSIS — M7062 Trochanteric bursitis, left hip: Secondary | ICD-10-CM

## 2019-06-22 DIAGNOSIS — M25562 Pain in left knee: Secondary | ICD-10-CM

## 2019-06-22 DIAGNOSIS — G629 Polyneuropathy, unspecified: Secondary | ICD-10-CM

## 2019-06-22 DIAGNOSIS — Z79899 Other long term (current) drug therapy: Secondary | ICD-10-CM | POA: Diagnosis not present

## 2019-06-22 DIAGNOSIS — E222 Syndrome of inappropriate secretion of antidiuretic hormone: Secondary | ICD-10-CM

## 2019-06-22 DIAGNOSIS — M51369 Other intervertebral disc degeneration, lumbar region without mention of lumbar back pain or lower extremity pain: Secondary | ICD-10-CM

## 2019-06-22 DIAGNOSIS — M5030 Other cervical disc degeneration, unspecified cervical region: Secondary | ICD-10-CM

## 2019-06-22 LAB — CBC WITH DIFFERENTIAL/PLATELET
Absolute Monocytes: 813 cells/uL (ref 200–950)
Basophils Absolute: 64 cells/uL (ref 0–200)
Basophils Relative: 0.6 %
Eosinophils Absolute: 64 cells/uL (ref 15–500)
Eosinophils Relative: 0.6 %
HCT: 35 % (ref 35.0–45.0)
Hemoglobin: 11.7 g/dL (ref 11.7–15.5)
Lymphs Abs: 1947 cells/uL (ref 850–3900)
MCH: 31 pg (ref 27.0–33.0)
MCHC: 33.4 g/dL (ref 32.0–36.0)
MCV: 92.8 fL (ref 80.0–100.0)
MPV: 9.9 fL (ref 7.5–12.5)
Monocytes Relative: 7.6 %
Neutro Abs: 7811 cells/uL — ABNORMAL HIGH (ref 1500–7800)
Neutrophils Relative %: 73 %
Platelets: 391 10*3/uL (ref 140–400)
RBC: 3.77 10*6/uL — ABNORMAL LOW (ref 3.80–5.10)
RDW: 13.3 % (ref 11.0–15.0)
Total Lymphocyte: 18.2 %
WBC: 10.7 10*3/uL (ref 3.8–10.8)

## 2019-06-22 LAB — COMPLETE METABOLIC PANEL WITH GFR
AG Ratio: 1.6 (calc) (ref 1.0–2.5)
ALT: 18 U/L (ref 6–29)
AST: 24 U/L (ref 10–35)
Albumin: 4 g/dL (ref 3.6–5.1)
Alkaline phosphatase (APISO): 64 U/L (ref 37–153)
BUN: 13 mg/dL (ref 7–25)
CO2: 25 mmol/L (ref 20–32)
Calcium: 10.3 mg/dL (ref 8.6–10.4)
Chloride: 98 mmol/L (ref 98–110)
Creat: 0.83 mg/dL (ref 0.60–0.93)
GFR, Est African American: 79 mL/min/{1.73_m2} (ref >=60)
GFR, Est Non African American: 68 mL/min/{1.73_m2} (ref >=60)
Globulin: 2.5 g/dL (calc) (ref 1.9–3.7)
Glucose, Bld: 89 mg/dL (ref 65–99)
Potassium: 5.5 mmol/L — ABNORMAL HIGH (ref 3.5–5.3)
Sodium: 131 mmol/L — ABNORMAL LOW (ref 135–146)
Total Bilirubin: 0.4 mg/dL (ref 0.2–1.2)
Total Protein: 6.5 g/dL (ref 6.1–8.1)

## 2019-06-22 MED ORDER — TRIAMCINOLONE ACETONIDE 40 MG/ML IJ SUSP
40.0000 mg | INTRAMUSCULAR | Status: AC | PRN
  Administered 2019-06-22: 40 mg via INTRA_ARTICULAR

## 2019-06-22 MED ORDER — LIDOCAINE HCL 1 % IJ SOLN
1.5000 mL | INTRAMUSCULAR | Status: AC | PRN
  Administered 2019-06-22: 1.5 mL

## 2019-06-22 NOTE — Progress Notes (Signed)
Office Visit Note  Patient: Felicia [name]             Date of Birth: Jan 31, 1941           MRN: [id number]             PCP: Kathyrn Lass, MD Referring: Kathyrn Lass, MD Visit Date: 06/22/2019 Occupation: @GUAROCC @  Subjective:  Right knee joint pain   History of Present Illness: Felicia [name] is a 79 y.o. female with history of seropositive rheumatoid arthritis and osteoarthritis.  She is on orencia 125 mg sq injection once weekly, MTX 0.8 ml sq once weekly, and folic acid 2 mg po daily. She presents today with pain in the right knee joint, which started about 2 months ago.  She has noticed swelling in the right knee joint.  Her left knee has been aching as well.   She has been having increased arthralgias with cooler weather temperatures.    Activities of Daily Living:  Patient reports morning stiffness for 1 hour.   Patient Reports nocturnal pain.  Difficulty dressing/grooming: Denies Difficulty climbing stairs: Reports Difficulty getting out of chair: Reports Difficulty using hands for taps, buttons, cutlery, and/or writing: Reports  Review of Systems  Constitutional: Positive for fatigue.  HENT: Positive for mouth dryness. Negative for mouth sores and nose dryness.   Eyes: Positive for dryness. Negative for pain and visual disturbance.  Respiratory: Negative for cough, hemoptysis, shortness of breath and difficulty breathing.   Cardiovascular: Negative for chest pain, palpitations, hypertension and swelling in legs/feet.  Gastrointestinal: Negative for blood in stool, constipation and diarrhea.  Endocrine: Negative for increased urination.  Genitourinary: Negative for painful urination.  Musculoskeletal: Positive for joint swelling. Negative for arthralgias, joint pain, myalgias, muscle weakness, morning stiffness, muscle tenderness and myalgias.  Skin: Negative for color change, pallor, rash, hair loss, nodules/bumps, skin tightness, ulcers and sensitivity to sunlight.    Allergic/Immunologic: Negative for susceptible to infections.  Neurological: Negative for dizziness, numbness, headaches and weakness.  Hematological: Negative for swollen glands.  Psychiatric/Behavioral: Negative for depressed mood and sleep disturbance. The patient is not nervous/anxious.     PMFS History:  Patient Active Problem List   Diagnosis Date Noted  . Thrombocytosis ([place]) 08/01/2018  . URI (upper respiratory infection) 04/14/2017  . Primary osteoarthritis of both knees 03/06/2017  . Exertional chest pain   . Pain in the chest 03/27/2015  . Postural hypotension 05/26/2013  . OSA (obstructive sleep apnea) 03/22/2013  . Osteoarthritis 11/07/2011  . Abdominal bruit 10/16/2011  . HTN (hypertension) 08/13/2011  . Anisocoria 08/13/2011  . Cervical radiculopathy 07/17/2011  . DDD (degenerative disc disease), cervical 07/10/2011  . Glaucoma 06/06/2011  . HIATAL HERNIA 01/30/2010  . POSTMENOPAUSAL SYNDROME 12/14/2009  . EUSTACHIAN TUBE DYSFUNCTION, RIGHT 06/28/2009  . GERD 12/07/2008  . Rheumatoid arthritis ([place]) 12/07/2008  . COLONIC POLYPS, HX OF 12/07/2008  . Hyperlipidemia LDL goal <70 10/17/2006  . HYPERCALCEMIA 10/17/2006  . Mitral valve disease 10/17/2006    Past Medical History:  Diagnosis Date  . Adenomatous colon polyp 2004   Colo in 2009 "no polyps"  . Complication of anesthesia    small airway per patient  . GERD (gastroesophageal reflux disease)   . Glaucoma   . Hiatal hernia   . Hypertension   . Neuropathy   . Rheumatoid arthritis(714.0)    Orencia; Methotrexate (Dr. Patrecia Pour)  . Sleep apnea    on CPAP    Family History  Problem Relation Age of Onset  .  Dementia Mother   . Lung disease Mother        bronchiectasis  . Heart disease Mother        Aortic Stenosis  . Osteoporosis Mother   . Heart attack Father 34       S/P CBAG  . Stroke Maternal Grandmother 83  . Ulcerative colitis Brother   . Diabetes Neg Hx   . Cancer Neg Hx    Past  Surgical History:  Procedure Laterality Date  . CARDIAC CATHETERIZATION N/A 03/29/2015   Procedure: Left Heart Cath and Coronary Angiography;  Surgeon: Belva Crome, MD;  Location: [facility];  Service: Cardiovascular;  Laterality: N/A;  . CATARACT EXTRACTION, BILATERAL    . COLONOSCOPY W/ POLYPECTOMY     X1; negative subsequently  . DILATION AND CURETTAGE OF UTERUS    . EYE SURGERY     due to RA  . EYE SURGERY Left 06/2017   eyelid tendon   . HYSTEROSCOPY WITH D & C  06/24/2012   Procedure: DILATATION AND CURETTAGE /HYSTEROSCOPY;  Surgeon: [name], MD;  Location: [facility];  Service: Gynecology;  Laterality: N/A;  . SHOULDER SURGERY Left    due to RA  . TRABECULECTOMY     X 2 OD; X 1 OS  . UPPER GASTROINTESTINAL ENDOSCOPY     hiatal hernia   Social History   Social History Narrative  . Not on file   Immunization History  Administered Date(s) Administered  . Influenza Split 03/08/2014  . Influenza Whole 04/07/2007, 02/24/2008, 03/10/2009, 03/03/2012  . Influenza, High Dose Seasonal PF 01/15/2016, 03/07/2017  . Influenza,inj,Quad PF,6+ Mos 03/15/2013, 02/21/2015, 03/17/2018  . PPD Test 03/10/2012  . Pneumococcal Conjugate-13 09/12/2015  . Pneumococcal Polysaccharide-23 03/25/2003, 04/12/2008  . Td 12/14/2009  . Zoster Recombinat (Shingrix) 03/23/2018     Objective: Vital Signs: BP (!) 142/73 (BP Location: Left Arm, Patient Position: Sitting, Cuff Size: Small)   Pulse 65   Resp 12   Ht 5' 5.5" (1.664 m)   Wt 139 lb 6.4 oz (63.2 kg)   BMI 22.84 kg/m    Physical Exam Vitals and nursing note reviewed.  Constitutional:      Appearance: She is well-developed.  HENT:     Head: Normocephalic and atraumatic.  Eyes:     Conjunctiva/sclera: Conjunctivae normal.  Cardiovascular:     Rate and Rhythm: Normal rate and regular rhythm.     Heart sounds: Normal heart sounds.  Pulmonary:     Effort: Pulmonary effort is normal.     Breath sounds: Normal breath  sounds.  Abdominal:     General: Bowel sounds are normal.     Palpations: Abdomen is soft.  Musculoskeletal:     Cervical back: Normal range of motion.  Lymphadenopathy:     Cervical: No cervical adenopathy.  Skin:    General: Skin is warm and dry.     Capillary Refill: Capillary refill takes less than 2 seconds.  Neurological:     Mental Status: She is alert and oriented to person, place, and time.  Psychiatric:        Behavior: Behavior normal.      Musculoskeletal Exam: C-spine, thoracic spine, and lumbar spine good ROM.  Shoulder joints, elbow joints, wrist joints, MCPs, PIPs, and DIPs good ROM with no synovitis. PIP and DIP synovial thickening consistent with osteoarthritis.  CMC joint synovial thickening bilaterally.  Complete fist formation bilaterally.  Hip joints good ROM.  Right knee painful ROM. Right  knee warmth and inflammation noted. Left knee joint good ROM with no warmth or effusion.  Ankle joints good ROM.  No tenderness or inflammation of the ankle joints.    CDAI Exam: CDAI Score: 3.2  Patient Global: 6 mm; Provider Global: 6 mm Swollen: 1 ; Tender: 1  Joint Exam 06/22/2019      Right  Left  Knee  Swollen Tender        Investigation: No additional findings.  Imaging: No results found.  Recent Labs: Lab Results  Component Value Date   WBC 8.6 04/02/2019   HGB 12.0 04/02/2019   PLT 514 (H) 04/02/2019   NA 129 (L) 04/02/2019   K 6.3 (HH) 04/02/2019   CL 97 (L) 04/02/2019   CO2 22 04/02/2019   GLUCOSE 97 04/02/2019   BUN 13 04/02/2019   CREATININE 0.78 04/02/2019   BILITOT 0.4 04/02/2019   ALKPHOS 67 08/01/2018   AST 19 04/02/2019   ALT 13 04/02/2019   PROT 6.5 04/02/2019   ALBUMIN 4.0 08/01/2018   CALCIUM 10.0 04/02/2019   GFRAA 85 04/02/2019   QFTBGOLDPLUS NEGATIVE 12/02/2018    Speciality Comments: TB gold neg 11/07/17  Procedures:  Large Joint Inj: R knee on 06/22/2019 1:14 PM Indications: pain Details: 27 G 1.5 in needle, medial  approach  Arthrogram: No  Medications: 1.5 mL lidocaine 1 %; 40 mg triamcinolone acetonide 40 MG/ML Aspirate: 0 mL Outcome: tolerated well, no immediate complications Procedure, treatment alternatives, risks and benefits explained, specific risks discussed. Consent was given by the patient. Immediately prior to procedure a time out was called to verify the correct patient, procedure, equipment, support staff and site/side marked as required. Patient was prepped and draped in the usual sterile fashion.     Allergies: Sulfonamide derivatives, Amlodipine, Zostavax [zoster vaccine live], and Norvasc [amlodipine besylate]   Assessment / Plan:     Visit Diagnoses: Rheumatoid arthritis involving multiple sites with positive rheumatoid factor (HCC) -  RF+, ANA +: She presents today with right knee joint pain, warmth, and inflammation.  She has painful range of motion of the right knee on exam.  X-rays of both knee joints were obtained today.  She requested a right knee joint cortisone injection.  She tolerated the procedure well.  She has been having increased arthralgias with cooler weather temperatures.  She continues to inject Orencia 125 mg subcutaneously every week, methotrexate 0.8 mL once weekly, and folic acid 2 mg by mouth daily.  She has not missed any doses of these medications.  She will continue on this current treatment regimen.  She was advised to notify us if she develops increased joint pain or joint swelling.  She will follow-up in the office in 5 months.  High risk medication use - Orencia 125 mg every 7 days, methotrexate 0.8 mL every 7 days, and folic acid 1 mg 2 tablets daily.  Last TB: Negative on 12/02/2018 CBC and CMP will be drawn today to monitor for drug toxicity.  She will return for lab work in April and every 3 months.  Primary osteoarthritis of both hands: She has PIP and DIP synovial thickening consistent with osteoarthritis of both hands.  She has bilateral CMC joint  synovial thickening.  No tenderness or synovitis was noted.  Joint protection and muscle strengthening were discussed.  Primary osteoarthritis of both knees: She has been having increased discomfort in bilateral knee joints for the past 2 months.  She describes the sensation as an aching discomfort.  She  states that for the past several days her right knee has been warm and inflamed.  She has painful range of motion of the right knee joint on exam.  Warmth and inflammation of the right knee was noted.  X-rays of both knees were obtained today, which showed bilateral moderate medial compartment narrowing and moderate chondromalacia patella consistent with osteoarthritis.  She requested a right knee joint cortisone injection.  She tolerated procedure well.  Aftercare was discussed.  The procedure note was completed above.  She would like to apply for Visco gel injections for both knee joints.  Chronic pain of both knees - She presents today with pain in bilateral knee joints.  She has been having increased discomfort for the past 2 months.  She has warmth and inflammation of the right knee joint on exam today.  She has painful right knee joint range of motion.  X-rays of both knees were obtained today.  She requested a right knee joint cortisone injection.  We will apply for Visco gel injections for both knee joints.  She was given a handout of knee joint exercises to perform.  Plan: XR KNEE 3 VIEW RIGHT, XR KNEE 3 VIEW LEFT  Trochanteric bursitis, left hip: She has intermittent left trochanteric bursitis.  She has no tenderness on exam today.  DDD (degenerative disc disease), cervical: She has good range of motion of the C-spine on exam.  She has no symptoms of radiculopathy.  She performs neck exercises on a daily basis.  DDD (degenerative disc disease), lumbar: She has good range of motion no discomfort.  No symptoms of radiculopathy.  Neuropathy: She takes gabapentin as prescribed.  Other medical  conditions are listed as follows:  History of sleep apnea  History of hyperlipidemia  SIADH (syndrome of inappropriate ADH production) (HCC)  History of glaucoma  History of colonic polyps  History of gastroesophageal reflux (GERD)    Orders: Orders Placed This Encounter  Procedures  . Large Joint Inj: R knee  . XR KNEE 3 VIEW RIGHT  . XR KNEE 3 VIEW LEFT  . CBC with Differential/Platelet  . COMPLETE METABOLIC PANEL WITH GFR   No orders of the defined types were placed in this encounter.   Face-to-face time spent with patient was 30 minutes. Greater than 50% of time was spent in counseling and coordination of care.  Follow-Up Instructions: Return in about 5 months (around 11/20/2019) for Rheumatoid arthritis, Osteoarthritis.   Bo Merino, MD   Scribed by-  Hazel Sams, PA-C  Note - This record has been created using Dragon software.  Chart creation errors have been sought, but may not always  have been located. Such creation errors do not reflect on  the standard of medical care.

## 2019-06-22 NOTE — Progress Notes (Signed)
Appeal letter for [place] faxed to [place].

## 2019-06-22 NOTE — Telephone Encounter (Signed)
Appeal letter with supporting medical records faxed to [facility].  Will update when we receive a response.  Fax # [phone]  Mariella Saa, PharmD, [place], CPP Clinical Specialty Pharmacist [phone]  06/22/2019 10:29 AM

## 2019-06-22 NOTE — Patient Instructions (Signed)
Journal for Nurse Practitioners, 15(4), 263-267. Retrieved March 09, 2018 from [email or web address].com/[email or web address]">  Knee Exercises Ask your health care provider which exercises are safe for you. Do exercises exactly as told by your health care provider and adjust them as directed. It is normal to feel mild stretching, pulling, tightness, or discomfort as you do these exercises. Stop right away if you feel sudden pain or your pain gets worse. Do not begin these exercises until told by your health care provider. Stretching and range-of-motion exercises These exercises warm up your muscles and joints and improve the movement and flexibility of your knee. These exercises also help to relieve pain and swelling. Knee extension, prone 1. Lie on your abdomen (prone position) on a bed. 2. Place your left / right knee just beyond the edge of the surface so your knee is not on the bed. You can put a towel under your left / right thigh just above your kneecap for comfort. 3. Relax your leg muscles and allow gravity to straighten your knee (extension). You should feel a stretch behind your left / right knee. 4. Hold this position for __________ seconds. 5. Scoot up so your knee is supported between repetitions. Repeat __________ times. Complete this exercise __________ times a day. Knee flexion, active  1. Lie on your back with both legs straight. If this causes back discomfort, bend your left / right knee so your foot is flat on the floor. 2. Slowly slide your left / right heel back toward your buttocks. Stop when you feel a gentle stretch in the front of your knee or thigh (flexion). 3. Hold this position for __________ seconds. 4. Slowly slide your left / right heel back to the starting position. Repeat __________ times. Complete this exercise __________ times a day. Quadriceps stretch, prone  1. Lie on your abdomen on a firm surface, such as a bed or padded floor. 2. Bend your left / right knee and hold  your ankle. If you cannot reach your ankle or pant leg, loop a belt around your foot and grab the belt instead. 3. Gently pull your heel toward your buttocks. Your knee should not slide out to the side. You should feel a stretch in the front of your thigh and knee (quadriceps). 4. Hold this position for __________ seconds. Repeat __________ times. Complete this exercise __________ times a day. Hamstring, supine 1. Lie on your back (supine position). 2. Loop a belt or towel over the ball of your left / right foot. The ball of your foot is on the walking surface, right under your toes. 3. Straighten your left / right knee and slowly pull on the belt to raise your leg until you feel a gentle stretch behind your knee (hamstring). ? Do not let your knee bend while you do this. ? Keep your other leg flat on the floor. 4. Hold this position for __________ seconds. Repeat __________ times. Complete this exercise __________ times a day. Strengthening exercises These exercises build strength and endurance in your knee. Endurance is the ability to use your muscles for a long time, even after they get tired. Quadriceps, isometric This exercise stretches the muscles in front of your thigh (quadriceps) without moving your knee joint (isometric). 1. Lie on your back with your left / right leg extended and your other knee bent. Put a rolled towel or small pillow under your knee if told by your health care provider. 2. Slowly tense the muscles in the front of your left /   right thigh. You should see your kneecap slide up toward your hip or see increased dimpling just above the knee. This motion will push the back of the knee toward the floor. 3. For __________ seconds, hold the muscle as tight as you can without increasing your pain. 4. Relax the muscles slowly and completely. Repeat __________ times. Complete this exercise __________ times a day. Straight leg raises This exercise stretches the muscles in front  of your thigh (quadriceps) and the muscles that move your hips (hip flexors). 1. Lie on your back with your left / right leg extended and your other knee bent. 2. Tense the muscles in the front of your left / right thigh. You should see your kneecap slide up or see increased dimpling just above the knee. Your thigh may even shake a bit. 3. Keep these muscles tight as you raise your leg 4-6 inches (10-15 cm) off the floor. Do not let your knee bend. 4. Hold this position for __________ seconds. 5. Keep these muscles tense as you lower your leg. 6. Relax your muscles slowly and completely after each repetition. Repeat __________ times. Complete this exercise __________ times a day. Hamstring, isometric 1. Lie on your back on a firm surface. 2. Bend your left / right knee about __________ degrees. 3. Dig your left / right heel into the surface as if you are trying to pull it toward your buttocks. Tighten the muscles in the back of your thighs (hamstring) to "dig" as hard as you can without increasing any pain. 4. Hold this position for __________ seconds. 5. Release the tension gradually and allow your muscles to relax completely for __________ seconds after each repetition. Repeat __________ times. Complete this exercise __________ times a day. Hamstring curls If told by your health care provider, do this exercise while wearing ankle weights. Begin with __________ lb weights. Then increase the weight by 1 lb (0.5 kg) increments. Do not wear ankle weights that are more than __________ lb. 1. Lie on your abdomen with your legs straight. 2. Bend your left / right knee as far as you can without feeling pain. Keep your hips flat against the floor. 3. Hold this position for __________ seconds. 4. Slowly lower your leg to the starting position. Repeat __________ times. Complete this exercise __________ times a day. Squats This exercise strengthens the muscles in front of your thigh and knee  (quadriceps). 1. Stand in front of a table, with your feet and knees pointing straight ahead. You may rest your hands on the table for balance but not for support. 2. Slowly bend your knees and lower your hips like you are going to sit in a chair. ? Keep your weight over your heels, not over your toes. ? Keep your lower legs upright so they are parallel with the table legs. ? Do not let your hips go lower than your knees. ? Do not bend lower than told by your health care provider. ? If your knee pain increases, do not bend as low. 3. Hold the squat position for __________ seconds. 4. Slowly push with your legs to return to standing. Do not use your hands to pull yourself to standing. Repeat __________ times. Complete this exercise __________ times a day. Wall slides This exercise strengthens the muscles in front of your thigh and knee (quadriceps). 1. Lean your back against a smooth wall or door, and walk your feet out 18-24 inches (46-61 cm) from it. 2. Place your feet hip-width apart. 3.   Slowly slide down the wall or door until your knees bend __________ degrees. Keep your knees over your heels, not over your toes. Keep your knees in line with your hips. 4. Hold this position for __________ seconds. Repeat __________ times. Complete this exercise __________ times a day. Straight leg raises This exercise strengthens the muscles that rotate the leg at the hip and move it away from your body (hip abductors). 1. Lie on your side with your left / right leg in the top position. Lie so your head, shoulder, knee, and hip line up. You may bend your bottom knee to help you keep your balance. 2. Roll your hips slightly forward so your hips are stacked directly over each other and your left / right knee is facing forward. 3. Leading with your heel, lift your top leg 4-6 inches (10-15 cm). You should feel the muscles in your outer hip lifting. ? Do not let your foot drift forward. ? Do not let your knee  roll toward the ceiling. 4. Hold this position for __________ seconds. 5. Slowly return your leg to the starting position. 6. Let your muscles relax completely after each repetition. Repeat __________ times. Complete this exercise __________ times a day. Straight leg raises This exercise stretches the muscles that move your hips away from the front of the pelvis (hip extensors). 1. Lie on your abdomen on a firm surface. You can put a pillow under your hips if that is more comfortable. 2. Tense the muscles in your buttocks and lift your left / right leg about 4-6 inches (10-15 cm). Keep your knee straight as you lift your leg. 3. Hold this position for __________ seconds. 4. Slowly lower your leg to the starting position. 5. Let your leg relax completely after each repetition. Repeat __________ times. Complete this exercise __________ times a day. This information is not intended to replace advice given to you by your health care provider. Make sure you discuss any questions you have with your health care provider. Document Revised: 03/10/2018 Document Reviewed: 03/10/2018 Elsevier Patient Education  2020 [employer] Inc.  

## 2019-06-22 NOTE — Telephone Encounter (Signed)
Received a fax regarding Prior Authorization from [facility] for [facility]. Authorization has been DENIED because The member must meet the following criteria: has had prior therapy, or cannot use one of the following: Humira, Kevzara, and Rinvoq..  Will send document to scan center.  Phone# [phone]

## 2019-06-23 ENCOUNTER — Other Ambulatory Visit: Payer: Self-pay | Admitting: Rheumatology

## 2019-06-23 NOTE — Telephone Encounter (Signed)
Last Visit: 06/22/19 Next Visit: 09/13/19  Labs: 06/22/19 RBC count is borderline low. Sodium is chronically low. Potassium is elevated-5.5 but improved.  Okay to refill per Dr. Estanislado Pandy

## 2019-06-23 NOTE — Progress Notes (Signed)
RBC count is borderline low.  We will continue to monitor.   Sodium is chronically low.  Potassium is elevated-5.5 but improved.  Please notify patient and forward labs to PCP.

## 2019-06-24 ENCOUNTER — Ambulatory Visit: Payer: Medicare PPO | Attending: Internal Medicine

## 2019-06-24 DIAGNOSIS — Z23 Encounter for immunization: Secondary | ICD-10-CM | POA: Insufficient documentation

## 2019-06-24 NOTE — Progress Notes (Signed)
   Covid-19 Vaccination Clinic  Name:  Felicia [name]    MRN: [id number] DOB: Sep 11, 1940  06/24/2019  [name] was observed post Covid-19 immunization for 15 minutes without incidence. She was provided with Vaccine Information Sheet and instruction to access the V-Safe system.   [name] was instructed to call 911 with any severe reactions post vaccine: Marland Kitchen Difficulty breathing  . Swelling of your face and throat  . A fast heartbeat  . A bad rash all over your body  . Dizziness and weakness    Immunizations Administered    Name Date Dose VIS Date Route   Pfizer COVID-19 Vaccine 06/24/2019  5:45 PM 0.3 mL 05/14/2019 Intramuscular   Manufacturer: [place]   Lot: [id number]   [place]: [id number]

## 2019-06-25 NOTE — Telephone Encounter (Signed)
Received notification from [facility] regarding a prior authorization for [facility]. Authorization has been APPROVED from 06/24/19 to 06/02/20.   Will send document to scan center.  Authorization # [id number] Phone # [phone]  Called patient to advise, left message.  9:56 AM Felicia [name], CPhT

## 2019-06-28 ENCOUNTER — Other Ambulatory Visit: Payer: Self-pay

## 2019-06-28 ENCOUNTER — Encounter: Payer: Self-pay | Admitting: Diagnostic Neuroimaging

## 2019-06-28 ENCOUNTER — Ambulatory Visit: Payer: Medicare PPO | Admitting: Diagnostic Neuroimaging

## 2019-06-28 VITALS — BP 168/70 | HR 67 | Temp 97.0°F | Ht 65.5 in | Wt 135.0 lb

## 2019-06-28 DIAGNOSIS — G629 Polyneuropathy, unspecified: Secondary | ICD-10-CM

## 2019-06-28 DIAGNOSIS — R200 Anesthesia of skin: Secondary | ICD-10-CM

## 2019-06-28 MED ORDER — GABAPENTIN 100 MG PO CAPS: 100.0000 mg | ORAL_CAPSULE | Freq: Three times a day (TID) | ORAL | 4 refills | Status: DC

## 2019-06-28 NOTE — Progress Notes (Signed)
Chief Complaint  Patient presents with  . Neuropathy    rm 6, 9 month FU "taing gabapentin 1 tab in am, 2 tabs in pm, it is helping my hands and toes"     History of Present Illness:  - stable; gabapentin 100 / 200; neuropathy pain better - no major neck pain; arms are stable - no falls or major gait issues    Observations/Objective:  GENERAL EXAM/CONSTITUTIONAL: Vitals:  Vitals:   06/28/19 1308  BP: (!) 168/70  Pulse: 67  Temp: (!) 97 F (36.1 C)  Weight: 135 lb (61.2 kg)  Height: 5' 5.5" (1.664 m)     Body mass index is 22.12 kg/m. Wt Readings from Last 3 Encounters:  06/28/19 135 lb (61.2 kg)  06/22/19 139 lb 6.4 oz (63.2 kg)  06/07/19 136 lb 9.6 oz (62 kg)     Patient is in no distress; well developed, nourished and groomed; neck is supple  CARDIOVASCULAR:  Examination of carotid arteries is normal; no carotid bruits  Regular rate and rhythm, no murmurs  Examination of peripheral vascular system by observation and palpation is normal  EYES:  Ophthalmoscopic exam of optic discs and posterior segments is normal; no papilledema or hemorrhages  No exam data present  MUSCULOSKELETAL:  Gait, strength, tone, movements noted in Neurologic exam below  NEUROLOGIC: MENTAL STATUS:  No flowsheet data found.  awake, alert, oriented to person, place and time  recent and remote memory intact  normal attention and concentration  language fluent, comprehension intact, naming intact  fund of knowledge appropriate  CRANIAL NERVE:   2nd - no papilledema on fundoscopic exam  2nd, 3rd, 4th, 6th - pupils equal and reactive to light, visual fields full to confrontation, extraocular muscles intact, no nystagmus  5th - facial sensation symmetric  7th - facial strength symmetric  8th - hearing intact  9th - palate elevates symmetrically, uvula midline  11th - shoulder shrug symmetric  12th - tongue protrusion midline  MOTOR:   normal bulk and  tone, DIFFUSE 4+ strength in the BUE, BLE; LIMITED BY ARTHRITIS  SENSORY:   normal and symmetric to light touch  COORDINATION:   finger-nose-finger, fine finger movements normal  REFLEXES:   deep tendon reflexes TRACE and symmetric  GAIT/STATION:   narrow based gait   03/26/18 EMG/NCS - Axonal sensorimotor polyneuropathy.  03/10/18 MRI of the cervical spine shows multilevel degenerative changes as detailed above.  The most significant findings are: 1.   At C4-C5 and C5-C6, there is mild to moderate spinal stenosis.  Additionally, at C5-C6, there is also moderately severe right foraminal narrowing with possible right C6 nerve root compression.  Changes at C5-C6 have slightly progressed when compared to the 07/15/2012 MRI. 2.   At C6-C7, there is mild spinal stenosis but no nerve root compression. 3.   At T1-T2 there is minimal anterolisthesis of T1 over T2 that has progressed since the previous MRI.  There is no nerve root compression or spinal stenosis at this level.    Assessment and Plan:  79 y.o. female here with rheumatoid arthritis, here for evaluation of neuropathy.  Dx:  1. Neuropathy   2. Numbness     PLAN:  NEUROPATHY (idiopathic vs autoimmune) - continue gabapentin for symptom control; stable; offered to return patient back to PCP for refills, but she would like to return to once a year  Meds ordered this encounter  Medications  . gabapentin (NEURONTIN) 100 MG capsule    Sig:  Take 1 capsule (100 mg total) by mouth 3 (three) times daily.    Dispense:  270 capsule    Refill:  4   CERVICAL SPINAL STENOSIS - monitor for now; may consider neurosurgery eval in future  Follow Up Instructions:  Return in about 1 year (around 06/27/2020) for with NP (Amy Lomax).    I spent 15 minutes of face-to-face and non-face-to-face time with patient.  This included previsit chart review, lab review, study review, order entry, electronic health record documentation,  patient education.     Penni Bombard, MD [id number], 0000000 PM Certified in Neurology, Neurophysiology and Neuroimaging  [facility] [address], [place], [place] [address] [phone]

## 2019-07-02 ENCOUNTER — Other Ambulatory Visit: Payer: Self-pay | Admitting: Rheumatology

## 2019-07-08 ENCOUNTER — Telehealth: Payer: Self-pay | Admitting: Rheumatology

## 2019-07-08 NOTE — Telephone Encounter (Signed)
-----   Message from [place] sent at 06/22/2019  2:24 PM EST ----- Please apply for bilateral knee visco, per Hazel Sams, PA-C. Thanks!

## 2019-07-08 NOTE — Telephone Encounter (Addendum)
Please call, and schedule Visco series.  Schedule Monovisc, Bilateral Knees Buy and Bill Deductible does not apply. Authorization for medication # [id number] (eff. 07/06/19- 02/01/20) Once OOP is met expense covered at 100%. (currently $121.34 met of $3300.)  Co Pay of $35.00 each visit required.

## 2019-07-09 ENCOUNTER — Telehealth: Payer: Self-pay | Admitting: Interventional Cardiology

## 2019-07-09 NOTE — Telephone Encounter (Signed)
New message  Pt c/o medication issue:  1. Name of Medication: rosuvastatin (CRESTOR) 10 MG tablet  2. How are you currently taking this medication (dosage and times per day)? Take 1 tablet (10 mg total) by mouth daily.  3. Are you having a reaction (difficulty breathing--STAT)? Yes, muscle cramping in legs, pain in her joints, body aches,   4. What is your medication issue? Patient states that she is having a reaction to the medication and would like to try a new medication is possible. Please give patient a call to discuss.

## 2019-07-09 NOTE — Telephone Encounter (Signed)
Pt is calling in regarding her Crestor Rx. She has rheumatoid arthritis and has been experiencing increasingly worse cramping and aching in her joints. She is waking up several times per night due to the pain. She thinks it is the Crestor because she has been experiencing much more pain since starting the medication in early January. She has seen her RA MD who advised her to reach out to cardiology because her RA MD did not want to advise on the medication. She would like to know if there is another alternative instead of the Crestor that Dr. Tamala Julian could recommend?

## 2019-07-10 NOTE — Telephone Encounter (Signed)
Stop Rosuvastatin for 2 weeks. Then, resume but take it M, W, F. Give [place] feedback in 1 month.

## 2019-07-12 MED ORDER — ROSUVASTATIN CALCIUM 10 MG PO TABS: 10.0000 mg | ORAL_TABLET | ORAL | 3 refills | Status: DC

## 2019-07-12 NOTE — Telephone Encounter (Signed)
Follow up   Patient is returning your cal. Please call

## 2019-07-12 NOTE — Telephone Encounter (Signed)
Left message to call back  

## 2019-07-12 NOTE — Telephone Encounter (Signed)
Spoke with pt and reviewed recommendations. Pt verbalized understanding and was in agreement with plan.  

## 2019-07-13 ENCOUNTER — Ambulatory Visit: Payer: Medicare PPO | Attending: Internal Medicine

## 2019-07-13 DIAGNOSIS — Z23 Encounter for immunization: Secondary | ICD-10-CM

## 2019-07-13 NOTE — Progress Notes (Signed)
   Covid-19 Vaccination Clinic  Name:  Felicia [name]    MRN: [id number] DOB: [birth year]  07/13/2019  Felicia [name] was observed post Covid-19 immunization for 15 minutes without incidence. She was provided with Vaccine Information Sheet and instruction to access the V-Safe system.   Felicia [name] was instructed to call 911 with any severe reactions post vaccine: Marland Kitchen Difficulty breathing  . Swelling of your face and throat  . A fast heartbeat  . A bad rash all over your body  . Dizziness and weakness    Immunizations Administered    Name Date Dose VIS Date Route   Pfizer COVID-19 Vaccine 07/13/2019 11:12 AM 0.3 mL 05/14/2019 Intramuscular   Manufacturer: [place]   Lot: [id number]   [place]: [id number]

## 2019-07-19 ENCOUNTER — Ambulatory Visit: Payer: Medicare PPO | Admitting: Rheumatology

## 2019-07-19 ENCOUNTER — Other Ambulatory Visit: Payer: Self-pay

## 2019-07-19 ENCOUNTER — Telehealth: Payer: Self-pay

## 2019-07-19 DIAGNOSIS — M170 Bilateral primary osteoarthritis of knee: Secondary | ICD-10-CM

## 2019-07-19 MED ORDER — HYALURONAN 88 MG/4ML IX SOSY
88.0000 mg | PREFILLED_SYRINGE | INTRA_ARTICULAR | Status: AC | PRN
  Administered 2019-07-19: 88 mg via INTRA_ARTICULAR

## 2019-07-19 MED ORDER — LIDOCAINE HCL 1 % IJ SOLN
1.5000 mL | INTRAMUSCULAR | Status: AC | PRN
  Administered 2019-07-19: 1.5 mL

## 2019-07-19 NOTE — Telephone Encounter (Signed)
Returned patient's call, had to leave a message. [employer] did approved Orencia injections but patient can only receive 30 day supplies. [employer] denied the request for 90 day supply. Advised patient to contact office if she has issues filling medication at the pharmacy.

## 2019-07-19 NOTE — Progress Notes (Signed)
   Procedure Note  Patient: Felicia [name]             Date of Birth: Oct 28, 1940           MRN: [id number]             Visit Date: 07/19/2019  Procedures: Visit Diagnoses:  1. Primary osteoarthritis of both knees     Monovisc bilateral knees  Large Joint Inj: bilateral knee on 07/19/2019 1:03 PM Indications: pain Details: 22 G 1.5 in needle, medial approach  Arthrogram: No  Medications (Right): 1.5 mL lidocaine 1 %; 88 mg Hyaluronan 88 MG/4ML Aspirate (Right): 0 mL Medications (Left): 1.5 mL lidocaine 1 %; 88 mg Hyaluronan 88 MG/4ML Aspirate (Left): 0 mL Outcome: tolerated well, no immediate complications Procedure, treatment alternatives, risks and benefits explained, specific risks discussed. Consent was given by the patient. Immediately prior to procedure a time out was called to verify the correct patient, procedure, equipment, support staff and site/side marked as required. Patient was prepped and draped in the usual sterile fashion.     Bo Merino, MD

## 2019-07-19 NOTE — Telephone Encounter (Signed)
Patient would like to know if [facility] has approved Orencia injections? Patient states she has been receiving letters in the mail. Please advise.

## 2019-07-22 ENCOUNTER — Other Ambulatory Visit: Payer: Medicare PPO

## 2019-07-23 ENCOUNTER — Other Ambulatory Visit: Payer: Medicare PPO

## 2019-07-26 ENCOUNTER — Encounter: Payer: Self-pay | Admitting: Rheumatology

## 2019-07-27 ENCOUNTER — Telehealth: Payer: Self-pay | Admitting: Rheumatology

## 2019-07-27 NOTE — Telephone Encounter (Signed)
Called [employer] prior authorization department, rep advised as stipulated in the Appeal letter patient's drug plan has a day supply maximum for specialty medications. Rep stated an independent appeal can be submitted to [place] [employer] to see if day supply can be appeal. Turn around time is 60 calendar days.  [facility]- Fax# [phone] and [phone]                    Phone# [phone]  [employer] phone# [phone]  Called patient, she advised she was directed by pharmacy about apply for 90 days. I advised patient thatthis was already done in January and [employer] denied the 90 days request and only approved for 30. Advised patient we will work on appeal, but advised it would take time to process. She will call pharmacy to schedule 30 day supply.  2:36 PM Beatriz Chancellor, CPhT   2:31 PM Beatriz Chancellor, CPhT

## 2019-07-27 NOTE — Telephone Encounter (Signed)
Patient called stating she just got off the phone with [employer] and was told after reviewing her benefits she could get a 90 day supply for her prescription of Orencia.  Patient states a prior authorization and new prescription with 90 day supply needs to be faxed to:  [phone]  For general questions please call [phone]   For questions regarding prior authorizations please call [phone]  Patient states once they receive the new prescription and prior authorization she will be able to schedule delivery.

## 2019-07-28 ENCOUNTER — Other Ambulatory Visit: Payer: Self-pay

## 2019-07-28 ENCOUNTER — Other Ambulatory Visit: Payer: Medicare PPO | Admitting: *Deleted

## 2019-07-28 ENCOUNTER — Telehealth: Payer: Self-pay | Admitting: *Deleted

## 2019-07-28 DIAGNOSIS — E785 Hyperlipidemia, unspecified: Secondary | ICD-10-CM

## 2019-07-28 LAB — LIPID PANEL
Chol/HDL Ratio: 3 ratio (ref 0.0–4.4)
Cholesterol, Total: 192 mg/dL (ref 100–199)
HDL: 63 mg/dL (ref >39)
LDL Chol Calc (NIH): 112 mg/dL — ABNORMAL HIGH (ref 0–99)
Triglycerides: 95 mg/dL (ref 0–149)
VLDL Cholesterol Cal: 17 mg/dL (ref 5–40)

## 2019-07-28 LAB — HEPATIC FUNCTION PANEL
ALT: 11 IU/L (ref 0–32)
AST: 18 IU/L (ref 0–40)
Albumin: 4.1 g/dL (ref 3.7–4.7)
Alkaline Phosphatase: 75 IU/L (ref 39–117)
Bilirubin Total: 0.4 mg/dL (ref 0.0–1.2)
Bilirubin, Direct: 0.1 mg/dL (ref 0.00–0.40)
Total Protein: 6.4 g/dL (ref 6.0–8.5)

## 2019-07-28 NOTE — Telephone Encounter (Signed)
Received DEXA results from [facility].  Date of Scan: 07/26/2019 Lowest T-score and site measured: -2.5 Right 1/3 distal radius Significant changes in BMD and site measured (5% and above): 0.799 -10% Left Femur Neck   Current Regimen:Calcium and Vitamin D  Recommendation: Appointment Scheduled for 07/30/19 to discuss results.

## 2019-07-28 NOTE — Progress Notes (Signed)
Office Visit Note  Patient: Felicia [name]             Date of Birth: 09-30-1940           MRN: [id number]             PCP: Kathyrn Lass, MD Referring: Kathyrn Lass, MD Visit Date: 07/30/2019 Occupation: @GUAROCC @  Subjective:  Discuss DEXA results   History of Present Illness: Felicia [name] is a 79 y.o. female with history of seropositive rheumatoid arthritis, osteoarthritis, and osteoporosis.  Patient presents today to discuss recent DEXA results. She denies any recent rheumatoid arthritis flares.  She continues to inject Orencia 125 mg subcutaneous injections once weekly methotrexate 0.8 mL once weekly.  She has been having increased pain in both knee joints.  She had Monovisc injections in both knee joints on 07/19/2019 but has not noticed any improvement yet.  She has ongoing bursitis of the left hip and has been using ice pack which provides temporary relief.  She denies any joint swelling at this time.  She would like a referral to physical therapy to work on lower extremity strengthening and help with her knee pain.   Activities of Daily Living:  Patient reports morning stiffness for 30 minutes.   Patient Reports nocturnal pain.  Difficulty dressing/grooming: Denies Difficulty climbing stairs: Reports Difficulty getting out of chair: Denies Difficulty using hands for taps, buttons, cutlery, and/or writing: Reports  Review of Systems  Constitutional: Positive for fatigue.  HENT: Positive for mouth dryness. Negative for mouth sores and nose dryness.   Eyes: Positive for dryness. Negative for pain and visual disturbance.  Respiratory: Negative for cough, hemoptysis, shortness of breath and difficulty breathing.   Cardiovascular: Negative for chest pain, palpitations, hypertension and swelling in legs/feet.  Gastrointestinal: Negative for blood in stool, constipation and diarrhea.  Endocrine: Negative for heat intolerance and increased urination.  Genitourinary: Negative for  difficulty urinating and painful urination.  Musculoskeletal: Positive for arthralgias, joint pain, joint swelling and morning stiffness. Negative for myalgias, muscle weakness, muscle tenderness and myalgias.  Skin: Negative for color change, pallor, rash, hair loss, nodules/bumps, skin tightness, ulcers and sensitivity to sunlight.  Allergic/Immunologic: Negative for susceptible to infections.  Neurological: Negative for dizziness, headaches and memory loss.  Hematological: Negative for swollen glands.  Psychiatric/Behavioral: Positive for sleep disturbance. Negative for depressed mood and confusion. The patient is not nervous/anxious.     PMFS History:  Patient Active Problem List   Diagnosis Date Noted  . Thrombocytosis ([place]) 08/01/2018  . URI (upper respiratory infection) 04/14/2017  . Primary osteoarthritis of both knees 03/06/2017  . Exertional chest pain   . Pain in the chest 03/27/2015  . Postural hypotension 05/26/2013  . OSA (obstructive sleep apnea) 03/22/2013  . Osteoarthritis 11/07/2011  . Abdominal bruit 10/16/2011  . HTN (hypertension) 08/13/2011  . Anisocoria 08/13/2011  . Cervical radiculopathy 07/17/2011  . DDD (degenerative disc disease), cervical 07/10/2011  . Glaucoma 06/06/2011  . HIATAL HERNIA 01/30/2010  . POSTMENOPAUSAL SYNDROME 12/14/2009  . EUSTACHIAN TUBE DYSFUNCTION, RIGHT 06/28/2009  . GERD 12/07/2008  . Rheumatoid arthritis ([place]) 12/07/2008  . COLONIC POLYPS, HX OF 12/07/2008  . Hyperlipidemia LDL goal <70 10/17/2006  . HYPERCALCEMIA 10/17/2006  . Mitral valve disease 10/17/2006    Past Medical History:  Diagnosis Date  . Adenomatous colon polyp 2004   Colo in 2009 "no polyps"  . Complication of anesthesia    small airway per patient  . GERD (gastroesophageal reflux disease)   .  Glaucoma   . Hiatal hernia   . Hypertension   . Neuropathy   . Rheumatoid arthritis(714.0)    Orencia; Methotrexate (Dr. Patrecia Pour)  . Sleep apnea    on CPAP      Family History  Problem Relation Age of Onset  . Dementia Mother   . Lung disease Mother        bronchiectasis  . Heart disease Mother        Aortic Stenosis  . Osteoporosis Mother   . Heart attack Father 66       S/P CBAG  . Stroke Maternal Grandmother 83  . Ulcerative colitis Brother   . Diabetes Neg Hx   . Cancer Neg Hx    Past Surgical History:  Procedure Laterality Date  . CARDIAC CATHETERIZATION N/A 03/29/2015   Procedure: Left Heart Cath and Coronary Angiography;  Surgeon: Belva Crome, MD;  Location: [facility];  Service: Cardiovascular;  Laterality: N/A;  . CATARACT EXTRACTION, BILATERAL    . COLONOSCOPY W/ POLYPECTOMY     X1; negative subsequently  . DILATION AND CURETTAGE OF UTERUS    . EYE SURGERY     due to RA  . EYE SURGERY Left 06/2017   eyelid tendon   . HYSTEROSCOPY WITH D & C  06/24/2012   Procedure: DILATATION AND CURETTAGE /HYSTEROSCOPY;  Surgeon: [name], MD;  Location: [facility];  Service: Gynecology;  Laterality: N/A;  . SHOULDER SURGERY Left    due to RA  . TRABECULECTOMY     X 2 OD; X 1 OS  . UPPER GASTROINTESTINAL ENDOSCOPY     hiatal hernia   Social History   Social History Narrative  . Not on file   Immunization History  Administered Date(s) Administered  . Influenza Split 03/08/2014  . Influenza Whole 04/07/2007, 02/24/2008, 03/10/2009, 03/03/2012  . Influenza, High Dose Seasonal PF 01/15/2016, 03/07/2017  . Influenza,inj,Quad PF,6+ Mos 03/15/2013, 02/21/2015, 03/17/2018  . PFIZER SARS-COV-2 Vaccination 06/24/2019, 07/13/2019  . PPD Test 03/10/2012  . Pneumococcal Conjugate-13 09/12/2015  . Pneumococcal Polysaccharide-23 03/25/2003, 04/12/2008  . Td 12/14/2009  . Zoster Recombinat (Shingrix) 03/23/2018     Objective: Vital Signs: BP 138/83 (BP Location: Right Arm, Patient Position: Sitting, Cuff Size: Normal)   Pulse (!) 56   Resp 13   Ht 5' 4.25" (1.632 m)   Wt 133 lb 9.6 oz (60.6 kg)   BMI 22.75 kg/m     Physical Exam Vitals and nursing note reviewed.  Constitutional:      Appearance: She is well-developed.  HENT:     Head: Normocephalic and atraumatic.  Eyes:     Conjunctiva/sclera: Conjunctivae normal.  Pulmonary:     Effort: Pulmonary effort is normal.  Abdominal:     General: Bowel sounds are normal.     Palpations: Abdomen is soft.  Musculoskeletal:     Cervical back: Normal range of motion.  Lymphadenopathy:     Cervical: No cervical adenopathy.  Skin:    General: Skin is warm and dry.     Capillary Refill: Capillary refill takes less than 2 seconds.  Neurological:     Mental Status: She is alert and oriented to person, place, and time.  Psychiatric:        Behavior: Behavior normal.      Musculoskeletal Exam: C-spine, thoracic spine, lumbar spine good range of motion.  No midline spinal tenderness.  Shoulder joints, elbow joints, wrist joints, MCPs, PIPs, DIPs good range of motion with no  synovitis.  She has PIP and DIP synovial thickening consistent with osteoarthritis of both hands.  She has CMC joint synovial thickening bilaterally.  She has complete fist formation bilaterally.  Hip joints have good range of motion with no discomfort.  She has tenderness over the left trochanteric bursa.  Knee joints have good range of motion bilaterally.  No warmth or effusion was noted on exam.  Ankle joints have good range of motion with no tenderness or inflammation at this time.  CDAI Exam: CDAI Score: 1  Patient Global: 5 mm; Provider Global: 5 mm Swollen: 0 ; Tender: 0  Joint Exam 07/30/2019   No joint exam has been documented for this visit   There is currently no information documented on the homunculus. Go to the Rheumatology activity and complete the homunculus joint exam.  Investigation: No additional findings.  Imaging: No results found.  Recent Labs: Lab Results  Component Value Date   WBC 10.7 06/22/2019   HGB 11.7 06/22/2019   PLT 391 06/22/2019   NA  131 (L) 06/22/2019   K 5.5 (H) 06/22/2019   CL 98 06/22/2019   CO2 25 06/22/2019   GLUCOSE 89 06/22/2019   BUN 13 06/22/2019   CREATININE 0.83 06/22/2019   BILITOT 0.4 07/28/2019   ALKPHOS 75 07/28/2019   AST 18 07/28/2019   ALT 11 07/28/2019   PROT 6.4 07/28/2019   ALBUMIN 4.1 07/28/2019   CALCIUM 10.3 06/22/2019   GFRAA 79 06/22/2019   QFTBGOLDPLUS NEGATIVE 12/02/2018    Speciality Comments: TB gold neg 11/07/17  Procedures:  No procedures performed Allergies: Sulfonamide derivatives, Amlodipine, Zostavax [zoster vaccine live], and Norvasc [amlodipine besylate]   Assessment / Plan:     Visit Diagnoses: Rheumatoid arthritis involving multiple sites with positive rheumatoid factor ([place]) - RF+, ANA +: She has no synovitis on exam.  She has not had any recent rheumatoid arthritis flares.  She continues to have chronic pain in both knee joints but no warmth or effusion was noted on exam today.  She had Monovisc injections in both knees on 07/19/2019 but has not noticed improvement yet.  She is clinically doing well on Orencia 125 mg sq injections every 7 days, methotrexate 0.8 mL once weekly, folic acid 2 mg by mouth daily.  She has not missed any doses of Orencia or methotrexate recently.  She will continue on Orencia and methotrexate as prescribed.  She is advised to notify us if she develops increased joint pain or joint swelling.  She will follow-up in the office on 09/13/19.    High risk medication use - Orencia 125 mg every 7 days, methotrexate 0.8 mL every 7 days, and folic acid 1 mg 2 tablets daily.  She has not had any recent infections.  She has received both COVID-19 vaccinations.  CBC and CMP were drawn today to monitor for drug toxicity.  TB gold was negative on 12/02/2018.- Plan: CBC with Differential/Platelet, COMPLETE METABOLIC PANEL WITH GFR  Primary osteoarthritis of both hands: She has PIP and DIP synovial thickening consistent with osteoarthritis of both hands.  She has CMC  joint synovial thickening bilaterally.  No tenderness or synovitis was noted on exam.  She has complete fist formation bilaterally.  Joint protection and muscle strengthening were discussed.  Primary osteoarthritis of both knees: Chronic pain.  She has been experiencing increased pain in both knee joints.  She had Monovisc injections in both knees on 07/19/2019 but has not noticed improvement yet.  She requested a referral  to physical therapy to work on lower extremity muscle strengthening.  Trochanteric bursitis, left hip: She has persistent tenderness over the left trochanteric bursa.  She has been performing stretching exercises on a regular basis.  She has also been using ice which is provided temporary relief.  She requested a referral to physical therapy.  DDD (degenerative disc disease), cervical: She has good range of motion of the C-spine.  She has no discomfort at this time.  She has no symptoms of radiculopathy.  DDD (degenerative disc disease), lumbar: Chronic pain. No midline spinal tenderness.  No SI joint tenderness.  She has no symptoms of radiculopathy.  Age-related osteoporosis without current pathological fracture -DEXA 07/26/2019 right 1/3 distal radius BMD 0.54 with T score -2.5.  -3% change in BMD for right 1/3 radius.  She had a -10% change in BMD for left femur neck.  We discussed recent DEXA results and compared them to her DEXA from 07/10/2017.  All questions were addressed.  We discussed treatment options in detail.  She has severe GERD and is taking Nexium as prescribed so she would not be a good candidate for Fosamax.  Her GFR is within normal limits.  We discussed Reclast versus Prolia.  Indications, contraindications, potential side effects of both were reviewed today in the office.  She would like to proceed with scheduling a IV Reclast infusion.  Baseline lab work was drawn today.  We will apply for Reclast through her insurance.  She was advised to continue to take calcium and  vitamin D as discussed.  We also discussed the importance of resistive exercises.  Plan: Parathyroid hormone, intact (no Ca), VITAMIN D 25 Hydroxy (Vit-D Deficiency, Fractures), Phosphorus, TSH, Serum protein electrophoresis with reflex, CBC with Differential/Platelet, COMPLETE METABOLIC PANEL WITH GFR  Neuropathy: She continues to take gabapentin as prescribed.  Other medical conditions are listed as follows  History of sleep apnea  SIADH (syndrome of inappropriate ADH production) ([place])  History of hyperlipidemia  History of glaucoma  History of colonic polyps  History of gastroesophageal reflux (GERD)   Orders: Orders Placed This Encounter  Procedures  . Parathyroid hormone, intact (no Ca)  . VITAMIN D 25 Hydroxy (Vit-D Deficiency, Fractures)  . Phosphorus  . TSH  . Serum protein electrophoresis with reflex  . CBC with Differential/Platelet  . COMPLETE METABOLIC PANEL WITH GFR   No orders of the defined types were placed in this encounter.   Face-to-face time spent with patient was 30 minutes. Greater than 50% of time was spent in counseling and coordination of care.  Follow-Up Instructions: Return for Rheumatoid arthritis, Osteoarthritis, Osteoporosis.   Ofilia Neas, PA-C  Note - This record has been created using Dragon software.  Chart creation errors have been sought, but may not always  have been located. Such creation errors do not reflect on  the standard of medical care.

## 2019-07-30 ENCOUNTER — Telehealth: Payer: Self-pay

## 2019-07-30 ENCOUNTER — Ambulatory Visit: Payer: Medicare PPO | Admitting: Physician Assistant

## 2019-07-30 ENCOUNTER — Other Ambulatory Visit: Payer: Self-pay | Admitting: *Deleted

## 2019-07-30 ENCOUNTER — Encounter: Payer: Self-pay | Admitting: Interventional Cardiology

## 2019-07-30 ENCOUNTER — Encounter: Payer: Self-pay | Admitting: Physician Assistant

## 2019-07-30 ENCOUNTER — Other Ambulatory Visit: Payer: Self-pay

## 2019-07-30 VITALS — BP 138/83 | HR 56 | Resp 13 | Ht 64.25 in | Wt 133.6 lb

## 2019-07-30 DIAGNOSIS — M5136 Other intervertebral disc degeneration, lumbar region: Secondary | ICD-10-CM

## 2019-07-30 DIAGNOSIS — M0579 Rheumatoid arthritis with rheumatoid factor of multiple sites without organ or systems involvement: Secondary | ICD-10-CM | POA: Diagnosis not present

## 2019-07-30 DIAGNOSIS — E222 Syndrome of inappropriate secretion of antidiuretic hormone: Secondary | ICD-10-CM

## 2019-07-30 DIAGNOSIS — Z79899 Other long term (current) drug therapy: Secondary | ICD-10-CM

## 2019-07-30 DIAGNOSIS — M5030 Other cervical disc degeneration, unspecified cervical region: Secondary | ICD-10-CM

## 2019-07-30 DIAGNOSIS — G629 Polyneuropathy, unspecified: Secondary | ICD-10-CM

## 2019-07-30 DIAGNOSIS — M170 Bilateral primary osteoarthritis of knee: Secondary | ICD-10-CM

## 2019-07-30 DIAGNOSIS — M19042 Primary osteoarthritis, left hand: Secondary | ICD-10-CM

## 2019-07-30 DIAGNOSIS — Z8639 Personal history of other endocrine, nutritional and metabolic disease: Secondary | ICD-10-CM

## 2019-07-30 DIAGNOSIS — M19041 Primary osteoarthritis, right hand: Secondary | ICD-10-CM

## 2019-07-30 DIAGNOSIS — Z8719 Personal history of other diseases of the digestive system: Secondary | ICD-10-CM

## 2019-07-30 DIAGNOSIS — E785 Hyperlipidemia, unspecified: Secondary | ICD-10-CM

## 2019-07-30 DIAGNOSIS — M810 Age-related osteoporosis without current pathological fracture: Secondary | ICD-10-CM

## 2019-07-30 DIAGNOSIS — Z8669 Personal history of other diseases of the nervous system and sense organs: Secondary | ICD-10-CM

## 2019-07-30 DIAGNOSIS — Z86010 Personal history of colonic polyps: Secondary | ICD-10-CM

## 2019-07-30 DIAGNOSIS — M7062 Trochanteric bursitis, left hip: Secondary | ICD-10-CM

## 2019-07-30 NOTE — Telephone Encounter (Signed)
Findings of benefits investigation for Reclast- [id number] :  Insurance: [employer]  Phone: [phone]  Plan is ACTIVE  Per Insurance, [place]- does NOT require precertification in the outpatient setting as long as the location is considered in-network. [place] is in-network with this plan. Patient has a $3,300 yearly OOP maximum that any additional administration fees could apply, and she has currently met $191. Visit will have a $40 copay.  Call patient to advise of findings, left voicemail.   3:31 PM Beatriz Chancellor, CPhT

## 2019-07-30 NOTE — Telephone Encounter (Signed)
Please apply for IV reclast, per Hazel Sams, PA-C. Thanks!

## 2019-07-30 NOTE — Telephone Encounter (Signed)
error 

## 2019-08-02 ENCOUNTER — Other Ambulatory Visit: Payer: Self-pay | Admitting: *Deleted

## 2019-08-02 DIAGNOSIS — M810 Age-related osteoporosis without current pathological fracture: Secondary | ICD-10-CM

## 2019-08-02 LAB — COMPLETE METABOLIC PANEL WITH GFR
AG Ratio: 1.8 (calc) (ref 1.0–2.5)
ALT: 13 U/L (ref 6–29)
AST: 19 U/L (ref 10–35)
Albumin: 4.2 g/dL (ref 3.6–5.1)
Alkaline phosphatase (APISO): 63 U/L (ref 37–153)
BUN: 16 mg/dL (ref 7–25)
CO2: 25 mmol/L (ref 20–32)
Calcium: 10.2 mg/dL (ref 8.6–10.4)
Chloride: 97 mmol/L — ABNORMAL LOW (ref 98–110)
Creat: 0.81 mg/dL (ref 0.60–0.93)
GFR, Est African American: 81 mL/min/{1.73_m2} (ref >=60)
GFR, Est Non African American: 70 mL/min/{1.73_m2} (ref >=60)
Globulin: 2.3 g/dL (calc) (ref 1.9–3.7)
Glucose, Bld: 94 mg/dL (ref 65–99)
Potassium: 4.8 mmol/L (ref 3.5–5.3)
Sodium: 131 mmol/L — ABNORMAL LOW (ref 135–146)
Total Bilirubin: 0.5 mg/dL (ref 0.2–1.2)
Total Protein: 6.5 g/dL (ref 6.1–8.1)

## 2019-08-02 LAB — CBC WITH DIFFERENTIAL/PLATELET
Absolute Monocytes: 624 cells/uL (ref 200–950)
Basophils Absolute: 58 cells/uL (ref 0–200)
Basophils Relative: 0.6 %
Eosinophils Absolute: 38 cells/uL (ref 15–500)
Eosinophils Relative: 0.4 %
HCT: 36.9 % (ref 35.0–45.0)
Hemoglobin: 12.4 g/dL (ref 11.7–15.5)
Lymphs Abs: 2170 cells/uL (ref 850–3900)
MCH: 31.2 pg (ref 27.0–33.0)
MCHC: 33.6 g/dL (ref 32.0–36.0)
MCV: 92.9 fL (ref 80.0–100.0)
MPV: 9.4 fL (ref 7.5–12.5)
Monocytes Relative: 6.5 %
Neutro Abs: 6710 cells/uL (ref 1500–7800)
Neutrophils Relative %: 69.9 %
Platelets: 466 10*3/uL — ABNORMAL HIGH (ref 140–400)
RBC: 3.97 10*6/uL (ref 3.80–5.10)
RDW: 13.4 % (ref 11.0–15.0)
Total Lymphocyte: 22.6 %
WBC: 9.6 10*3/uL (ref 3.8–10.8)

## 2019-08-02 LAB — PROTEIN ELECTROPHORESIS, SERUM, WITH REFLEX
Albumin ELP: 4.2 g/dL (ref 3.8–4.8)
Alpha 1: 0.3 g/dL (ref 0.2–0.3)
Alpha 2: 0.8 g/dL (ref 0.5–0.9)
Beta 2: 0.4 g/dL (ref 0.2–0.5)
Beta Globulin: 0.4 g/dL (ref 0.4–0.6)
Gamma Globulin: 0.8 g/dL (ref 0.8–1.7)
Total Protein: 7 g/dL (ref 6.1–8.1)

## 2019-08-02 LAB — PHOSPHORUS: Phosphorus: 3.9 mg/dL (ref 2.1–4.3)

## 2019-08-02 LAB — PARATHYROID HORMONE, INTACT (NO CA): PTH: 13 pg/mL — ABNORMAL LOW (ref 14–64)

## 2019-08-02 LAB — VITAMIN D 25 HYDROXY (VIT D DEFICIENCY, FRACTURES): Vit D, 25-Hydroxy: 69 ng/mL (ref 30–100)

## 2019-08-02 LAB — TSH: TSH: 1.77 mIU/L (ref 0.40–4.50)

## 2019-08-02 NOTE — Telephone Encounter (Signed)
Reclast Orders placed. Patient advised and provided number for infusion center to call and schedule. Patient advised she may start PT.

## 2019-08-02 NOTE — Progress Notes (Signed)
SPEP WNL.

## 2019-08-02 NOTE — Progress Notes (Signed)
Vitamin D WNL.  Phosphorus WNL.  TSH WNL.  Plts are mildly elevated but stable. Rest of CBC WNL.  CMP stable.

## 2019-08-02 NOTE — Telephone Encounter (Signed)
Called patient back and discussed Reclast benefits and advised that we are unable to provide exact patient responsibility due to it depending on how the facility bills the procedure. Patient would like to move forward with the Reclast infusion.   Patient would like to know if she can start physical therapy prior to receiving Reclast infusion. She is scheduled to begin next week. Requesting a return call.  11:41 AM Beatriz Chancellor, CPhT

## 2019-08-03 NOTE — Progress Notes (Signed)
PTH is borderline low but not concerning at this time. Reviewed lab work with Dr. Estanislado Pandy. Ok to proceed with scheduling reclast infusion.

## 2019-08-04 ENCOUNTER — Telehealth: Payer: Self-pay | Admitting: Internal Medicine

## 2019-08-04 ENCOUNTER — Telehealth: Payer: Self-pay | Admitting: Rheumatology

## 2019-08-04 NOTE — Telephone Encounter (Signed)
Patient called requesting her labwork results from 07/30/19 be sent to her PCP Dr. Kathyrn Lass.

## 2019-08-04 NOTE — Telephone Encounter (Signed)
Copy of results sent to PCP.  

## 2019-08-04 NOTE — Telephone Encounter (Signed)
Pt stated that insurance has changed to [facility] and they require PA for 90-day supply for Nexium.    Fax [phone] Phone [phone]

## 2019-08-04 NOTE — Telephone Encounter (Signed)
I called and spoke to [name]. She has not been seen since 2017 so we made her an appointment. She has enough Nexium to last until the visit.

## 2019-08-09 ENCOUNTER — Telehealth: Payer: Self-pay | Admitting: Rheumatology

## 2019-08-09 NOTE — Telephone Encounter (Signed)
Provided pharmacist with ICD-10 code.

## 2019-08-09 NOTE — Telephone Encounter (Signed)
[place] [facility] called stating a diagnosis code is needed for patient's prescription of Orencia.  Please add the code to the prescription and fax back.  Fax [phone]

## 2019-08-10 ENCOUNTER — Other Ambulatory Visit: Payer: Self-pay

## 2019-08-10 ENCOUNTER — Ambulatory Visit (HOSPITAL_COMMUNITY)
Admission: RE | Admit: 2019-08-10 | Discharge: 2019-08-10 | Disposition: A | Discharge status: Home / Self Care | Payer: Medicare PPO | Source: Ambulatory Visit | Attending: Rheumatology | Admitting: Rheumatology

## 2019-08-10 DIAGNOSIS — M810 Age-related osteoporosis without current pathological fracture: Secondary | ICD-10-CM | POA: Diagnosis not present

## 2019-08-10 MED ORDER — ACETAMINOPHEN 325 MG PO TABS: 650.0000 mg | ORAL_TABLET | ORAL | Status: AC

## 2019-08-10 MED ORDER — ACETAMINOPHEN 325 MG PO TABS
ORAL_TABLET | ORAL | Status: AC
  Administered 2019-08-10: 650 mg via ORAL
  Filled 2019-08-10: qty 2

## 2019-08-10 MED ORDER — DIPHENHYDRAMINE HCL 25 MG PO CAPS: 25.0000 mg | ORAL_CAPSULE | ORAL | Status: AC

## 2019-08-10 MED ORDER — DIPHENHYDRAMINE HCL 25 MG PO CAPS
ORAL_CAPSULE | ORAL | Status: AC
  Administered 2019-08-10: 25 mg via ORAL
  Filled 2019-08-10: qty 1

## 2019-08-10 MED ORDER — ZOLEDRONIC ACID 5 MG/100ML IV SOLN: 5.0000 mg | Freq: Once | INTRAVENOUS | Status: AC

## 2019-08-10 MED ORDER — ZOLEDRONIC ACID 5 MG/100ML IV SOLN
INTRAVENOUS | Status: AC
  Administered 2019-08-10: 5 mg via INTRAVENOUS
  Filled 2019-08-10: qty 100

## 2019-08-10 NOTE — Discharge Instructions (Signed)

## 2019-08-11 NOTE — Progress Notes (Signed)
Patient ID: Felicia [name]                 DOB: May 10, 1941                    MRN: [id number]     HPI: Felicia [name] is a 79 y.o. female patient referred to lipid clinic by Dr. Tamala Julian. PMH is significant for CAD, HTN, OSA with CPAP, GERD, and hx of RA. Last coronary angiography in 2016 showed 45% stenosis in proximal LAD. Patient was last seen by Dr. Tamala Julian on 06/07/19 where her LDL was elevated. She was started on rosuvastatin 10mg  daily. On 07/09/19, she complained of increasingly worse cramping and aching in joints that woke her up in the middle of the night that she believed to be due to rosuvastatin. She was instructed to stop rosuvastatin for 2 weeks then decrease the dose to three times weekly.   Patient arrives today for initial visit in good spirits. She states that she had significant muscle cramps and spasms that started a few days after starting rosuvastatin, but this went away quickly after stopping the medication. She did not restart it at the lower frequency recommended. She states that she already has significant pain from her RA but is able to differentiate what is causing her pain. Her exercise is limited, but she attends PT regularly and tries to stick to a diet low in saturated fats. She is interested in learning about the CV benefits of newer lipid-lowering medications, and she expresses interest in trying another statin before starting PCSK9 injections. Of note, she has already has a prior authorization approved for Repatha.  Current Medications: none Intolerances: rosuvastatin 10mg  daily (muscle cramps/spasms) Risk Factors: CAD, HTN, OSA, family history, former smoker LDL goal: < 70mg /dL  Diet: She enjoys eating salads and veggies, steak, and hamburger (1-2x/month). For breakfast yesterday she at blueberries and cheerios. She had low-fat chicken noodle soup and a [place] sandwich for lunch. Ate fruit and a small slice of cake for a snack. Ate half a bagel for dinner. Drinks water and coffee  (3 cups/day)  Exercise: Limited due to RA; attends PT where she does core exercise and stretching activities  Family History: The patient's family history includes Dementia in her mother; Heart attack (age of onset: 74) in her father; Heart disease in her mother; Lung disease in her mother; Osteoporosis in her mother; Stroke (age of onset: 82) in her maternal grandmother; Ulcerative colitis in her brother. There is no history of Diabetes or Cancer.  Social History: Former smoker (quit 1973, 0.5 ppd, 2.5 pack years)  Labs: 07/28/19: TC 192, TG 95, HDL 63, LDL 112 (no lipid-lowering medications, stopped rosuvastatin 10mg  daily 2-3 weeks prior) 06/07/19: TC 206, TG 148, HDL 61, LDL 119 (no lipid-lowering medications)  Past Medical History:  Diagnosis Date  . Adenomatous colon polyp 2004   Colo in 2009 "no polyps"  . Complication of anesthesia    small airway per patient  . GERD (gastroesophageal reflux disease)   . Glaucoma   . Hiatal hernia   . Hypertension   . Neuropathy   . Rheumatoid arthritis(714.0)    Orencia; Methotrexate (Dr. Patrecia Pour)  . Sleep apnea    on CPAP    Current Outpatient Medications on File Prior to Visit  Medication Sig Dispense Refill  . Abatacept (ORENCIA) 125 MG/ML SOSY INJECT 125MG  SUBCUTANEOUSLY WEEKLY 12 mL 0  . Apoaequorin (PREVAGEN PO) Take by mouth daily.    Marland Kitchen  Calcium Carbonate-Vitamin D (CALTRATE 600+D) 600-400 MG-UNIT per tablet Take 2 tablets by mouth daily.     . Carboxymethylcellulose Sodium (REFRESH OP) Apply 1 drop to eye daily as needed (FOR DRY EYES).     . Cholecalciferol (VITAMIN D3) 1000 UNITS CAPS Take 1 capsule by mouth daily.     . Cyanocobalamin (VITAMIN B-12) 2500 MCG SUBL Place under the tongue.    Marland Kitchen esomeprazole (NEXIUM) 40 MG capsule TAKE ONE CAPSULE BY MOUTH TWICE DAILY BEFORE A MEAL 180 capsule 3  . estradiol (ESTRACE) 1 MG tablet Take 1 mg by mouth daily.  0  . folic acid (FOLVITE) 1 MG tablet Take 2 tablets (2 mg total) by mouth  daily. 180 tablet 3  . gabapentin (NEURONTIN) 100 MG capsule Take 1 capsule (100 mg total) by mouth 3 (three) times daily. 270 capsule 4  . Krill Oil Omega-3 300 MG CAPS Take 1 capsule by mouth daily.    Marland Kitchen latanoprost (XALATAN) 0.005 % ophthalmic solution Place 1 drop at bedtime into the right eye.     . losartan (COZAAR) 100 MG tablet Take 100 mg by mouth daily.    . methotrexate 50 MG/2ML injection ADMINISTER 0.8 ML(20 MG) UNDER THE SKIN 1 TIME A WEEK 10 mL 0  . metoprolol (LOPRESSOR) 50 MG tablet Take 50 mg by mouth daily.   0  . metroNIDAZOLE (METROGEL) 0.75 % gel Apply 1 application topically 2 (two) times daily.     . MULTIPLE VITAMIN PO Take by mouth.    . progesterone (PROMETRIUM) 200 MG capsule Take 200 mg by mouth daily.    . rosuvastatin (CRESTOR) 10 MG tablet Take 1 tablet (10 mg total) by mouth every Monday, Wednesday, and Friday. 45 tablet 3  . timolol (TIMOPTIC) 0.5 % ophthalmic solution Place 1 drop into both eyes daily.     . TUBERCULIN SYR 1CC/27GX1/2" (B-D TB SYRINGE 1CC/27GX1/2") 27G X 1/2" 1 ML MISC FOR USE WITH METHOTREXATE 12 each 2  . valACYclovir (VALTREX) 1000 MG tablet Take 1,000 mg by mouth as needed (fever blister). Reported on 06/14/2015    . Wheat Dextrin (BENEFIBER DRINK MIX PO) Take at bedtime by mouth.     No current facility-administered medications on file prior to visit.    Allergies  Allergen Reactions  . Sulfonamide Derivatives     Rash, redness  . Amlodipine Swelling  . Zostavax [Zoster Vaccine Live]     Unable to get due to RA, per patient   . Norvasc [Amlodipine Besylate]     edema    Assessment/Plan:  1. Hyperlipidemia - LDL is above goal of < 70. Patient prefers to start a low dose of pravastatin 10mg  daily before considering a PCSK9 inhibitor. She would like to increase pravastatin to 20 mg in 2 weeks if she tolerates this dose. Discussed the LDL-lowering effects and CV benefits of both statins and PCSK9 inhibitors, and she would like some  time to think before starting Repatha. Discussed the importance of maintaining a low-saturated fat diet and staying active as much as her RA permits. Will call her in 10-14 days to follow-up on pravastatin tolerability, make any further medication changes, and to schedule labs.  Richardine Service, PharmD PGY1 Pharmacy Resident  [place]. [place]le, PharmD, BCACP, [place] [id number] [place], [place] [address] Phone: [phone]; Fax: [phone] 08/12/2019 12:01 PM

## 2019-08-12 ENCOUNTER — Other Ambulatory Visit: Payer: Self-pay

## 2019-08-12 ENCOUNTER — Ambulatory Visit (INDEPENDENT_AMBULATORY_CARE_PROVIDER_SITE_OTHER): Payer: Medicare PPO | Admitting: Pharmacist

## 2019-08-12 DIAGNOSIS — E785 Hyperlipidemia, unspecified: Secondary | ICD-10-CM

## 2019-08-12 MED ORDER — PRAVASTATIN SODIUM 20 MG PO TABS: 10.0000 mg | ORAL_TABLET | Freq: Every evening | ORAL | 11 refills | Status: DC

## 2019-08-12 NOTE — Patient Instructions (Addendum)
It was nice to meet you today!  Your LDL is 112 which is above your goal of < 70.  START taking pravastatin 10mg  (0.5 tablet) daily  STOP taking rosuvastatin  Please call us at [phone] if you have any questions or have muscle pain, otherwise I will call you in 10-14 days to follow-up with any symptoms and to schedule your lab work.

## 2019-08-17 ENCOUNTER — Encounter: Payer: Self-pay | Admitting: Internal Medicine

## 2019-08-17 ENCOUNTER — Ambulatory Visit: Payer: Medicare PPO | Admitting: Internal Medicine

## 2019-08-17 VITALS — BP 132/68 | HR 66 | Temp 98.2°F | Ht 64.5 in | Wt 134.0 lb

## 2019-08-17 DIAGNOSIS — K219 Gastro-esophageal reflux disease without esophagitis: Secondary | ICD-10-CM | POA: Diagnosis not present

## 2019-08-17 DIAGNOSIS — R350 Frequency of micturition: Secondary | ICD-10-CM | POA: Diagnosis not present

## 2019-08-17 DIAGNOSIS — K5900 Constipation, unspecified: Secondary | ICD-10-CM | POA: Diagnosis not present

## 2019-08-17 MED ORDER — ESOMEPRAZOLE MAGNESIUM 40 MG PO CPDR: DELAYED_RELEASE_CAPSULE | ORAL | 3 refills | Status: DC

## 2019-08-17 NOTE — Progress Notes (Signed)
DZIYAH ECKHOLM 79 y.o. 03-05-1941 [id number]  Assessment & Plan:   Encounter Diagnoses  Name Primary?  . Gastroesophageal reflux disease, unspecified whether esophagitis present Yes  . Constipation, unspecified constipation type   . Urinary frequency     Refill esomeprazole x 1 yr Await prior auth  Increase Benefiber to 2 tbsp daily Ask Dr. Sabra Heck about urinary frequency - she may need to see GU  RTC 2 years routine if to get refills  I appreciate the opportunity to care for her.  Aura Camps, MD     Subjective:   Chief Complaint: GERD, med refills  HPI [place] is here because it is time for refill of her generic Nexium which controls her heartburn symptoms for many years.  She was started on this by Dr. Sharlett Iles years ago and is continued.  However now with [facility] she requires a prior authorization.  She is also having a lot of urinary frequency but has not mentioned that to Dr. Sabra Heck.  And sometimes she does not feel like she completely empties her bowels and needs to defecate again.  Colonoscopy was performed in 2017 she is up-to-date, she has severe diverticulosis she tried MiraLAX but that was "too strong" and is currently taking Benefiber 1 tablespoon at bedtime. Allergies  Allergen Reactions  . Sulfonamide Derivatives     Rash, redness  . Amlodipine Swelling  . Zostavax [Zoster Vaccine Live]     Unable to get due to RA, per patient   . Norvasc [Amlodipine Besylate]     edema   Current Meds  Medication Sig  . Abatacept (ORENCIA) 125 MG/ML SOSY INJECT 125MG  SUBCUTANEOUSLY WEEKLY  . Apoaequorin (PREVAGEN PO) Take by mouth daily.  . Calcium Carbonate-Vitamin D (CALTRATE 600+D) 600-400 MG-UNIT per tablet Take 2 tablets by mouth daily.   . Carboxymethylcellulose Sodium (REFRESH OP) Apply 1 drop to eye daily as needed (FOR DRY EYES).   . Cholecalciferol (VITAMIN D3) 1000 UNITS CAPS Take 1 capsule by mouth daily.   . Cyanocobalamin (VITAMIN B-12) 2500  MCG SUBL Place under the tongue.  Marland Kitchen esomeprazole (NEXIUM) 40 MG capsule TAKE ONE CAPSULE BY MOUTH TWICE DAILY BEFORE A MEAL  . estradiol (ESTRACE) 1 MG tablet Take 1 mg by mouth daily.  . folic acid (FOLVITE) 1 MG tablet Take 2 tablets (2 mg total) by mouth daily.  Marland Kitchen gabapentin (NEURONTIN) 100 MG capsule Take 1 capsule (100 mg total) by mouth 3 (three) times daily.  Astrid Drafts Omega-3 300 MG CAPS Take 1 capsule by mouth daily.  Marland Kitchen latanoprost (XALATAN) 0.005 % ophthalmic solution Place 1 drop at bedtime into the right eye.   . losartan (COZAAR) 100 MG tablet Take 100 mg by mouth daily.  . methotrexate 50 MG/2ML injection ADMINISTER 0.8 ML(20 MG) UNDER THE SKIN 1 TIME A WEEK  . metoprolol (LOPRESSOR) 50 MG tablet Take 50 mg by mouth daily.   . metroNIDAZOLE (METROGEL) 0.75 % gel Apply 1 application topically 2 (two) times daily.   . MULTIPLE VITAMIN PO Take by mouth.  . pravastatin (PRAVACHOL) 20 MG tablet Take 0.5 tablets (10 mg total) by mouth every evening.  . progesterone (PROMETRIUM) 200 MG capsule Take 200 mg by mouth daily.  . timolol (TIMOPTIC) 0.5 % ophthalmic solution Place 1 drop into both eyes daily.   . TUBERCULIN SYR 1CC/27GX1/2" (B-D TB SYRINGE 1CC/27GX1/2") 27G X 1/2" 1 ML MISC FOR USE WITH METHOTREXATE  . valACYclovir (VALTREX) 1000 MG tablet Take 1,000 mg by  mouth as needed (fever blister). Reported on 06/14/2015  . Wheat Dextrin (BENEFIBER DRINK MIX PO) Take at bedtime by mouth.  . [DISCONTINUED] esomeprazole (NEXIUM) 40 MG capsule TAKE ONE CAPSULE BY MOUTH TWICE DAILY BEFORE A MEAL  . [DISCONTINUED] rosuvastatin (CRESTOR) 10 MG tablet Take 1 tablet (10 mg total) by mouth every Monday, Wednesday, and Friday.   Past Medical History:  Diagnosis Date  . Adenomatous colon polyp 2004   Colo in 2009 "no polyps"  . Complication of anesthesia    small airway per patient  . GERD (gastroesophageal reflux disease)   . Glaucoma   . Hiatal hernia   . Hypertension   . Neuropathy    . Rheumatoid arthritis(714.0)    Orencia; Methotrexate (Dr. Patrecia Pour)  . Sleep apnea    on CPAP   Past Surgical History:  Procedure Laterality Date  . CARDIAC CATHETERIZATION N/A 03/29/2015   Procedure: Left Heart Cath and Coronary Angiography;  Surgeon: Belva Crome, MD;  Location: [facility];  Service: Cardiovascular;  Laterality: N/A;  . CATARACT EXTRACTION, BILATERAL    . COLONOSCOPY W/ POLYPECTOMY     X1; negative subsequently  . DILATION AND CURETTAGE OF UTERUS    . EYE SURGERY     due to RA  . EYE SURGERY Left 06/2017   eyelid tendon   . HYSTEROSCOPY WITH D & C  06/24/2012   Procedure: DILATATION AND CURETTAGE /HYSTEROSCOPY;  Surgeon: [name], MD;  Location: [facility];  Service: Gynecology;  Laterality: N/A;  . SHOULDER SURGERY Left    due to RA  . TRABECULECTOMY     X 2 OD; X 1 OS  . 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   Review of Systems As above   Objective:   Physical Exam BP 132/68   Pulse 66   Temp 98.2 F (36.8 C)   Ht 5' 4.5" (1.638 m)   Wt 134 lb (60.8 kg)   BMI 22.65 kg/m  WDWN NAD Eyes anict approp mood and affect

## 2019-08-17 NOTE — Patient Instructions (Signed)
We have sent the following medications to your pharmacy for you to pick up at your convenience: Generic Nexium, we will probably have to do a prior authorization and that could take a few days.   Increase your Benefiber to 2 tablespoons nightly.   Contact your PCP about your urinary frequency issues.    I appreciate the opportunity to care for you. Silvano Rusk, MD, [facility]

## 2019-08-23 ENCOUNTER — Telehealth: Payer: Self-pay | Admitting: Pharmacist

## 2019-08-23 DIAGNOSIS — E785 Hyperlipidemia, unspecified: Secondary | ICD-10-CM

## 2019-08-23 MED ORDER — PRAVASTATIN SODIUM 20 MG PO TABS: 20.0000 mg | ORAL_TABLET | Freq: Every evening | ORAL | 11 refills | Status: DC

## 2019-08-23 NOTE — Telephone Encounter (Signed)
Spoke with patient about her pravastatin. She is tolerating pravastatin 10mg  daily well  and would like to try increasing the dose to 20mg  daily. She is still having flares of her rheumatoid arthritis but does not think pravastatin is causing her pain because it does not feel like the pain she had with rosuvastatin.  She would like to see if she can lower her LDL to goal of < 70 mg/dL with pravastatin 20mg  daily and lifestyle modifications before starting a PCSK9 inhibitor. Scheduled patient for follow-up fasting labs on 10/25/19. Will follow-up with patient via phone call after labs result to discuss any additional medication adjustments.

## 2019-08-23 NOTE — Telephone Encounter (Signed)
Called patient and left message. Following-up from office visit on 08/12/19 to see if she is tolerating pravastatin 10mg  daily. Will need to discuss whether she would like to increase pravastatin to 20mg  daily if she is tolerating medication and/or add a PCSK9 injection for better lipid-lowering effect.   Of note, she already has prior authorization approved for Repatha.

## 2019-08-26 ENCOUNTER — Telehealth: Payer: Self-pay | Admitting: Internal Medicine

## 2019-08-26 NOTE — Telephone Encounter (Signed)
Spoke with Delonda and she said Felicia [name] is not wanting to cover her generic Nexium for BID. I need to call [phone] the clinical review department and see what we can do to get it covered. She takes it for severe GERD K21.9.  I will call on this as soon as I get a chance.

## 2019-08-26 NOTE — Telephone Encounter (Signed)
Left patient message to call me back . I called the pharmacy and they said she picked up her generic Nexium on 08/17/2019.

## 2019-08-27 NOTE — Telephone Encounter (Signed)
I have started a prior authorization for the quantity of esomeprazole Tyronza takes twice daily for her GERD K21.9. I spoke with Ronaldo , ref # [id number] at [phone]. He said it will be 24-72 hours for a reply.  Patient informed and said thank you for working on this.

## 2019-08-27 NOTE — Telephone Encounter (Signed)
Esomeprazole 40mg  capsules for BID use approved thru 06/02/2020, patient informed and said thank you.

## 2019-09-01 ENCOUNTER — Other Ambulatory Visit: Payer: Self-pay | Admitting: Rheumatology

## 2019-09-08 NOTE — Progress Notes (Signed)
Office Visit Note  Patient: Felicia [name]             Date of Birth: [birth year]           MRN: [id number]             PCP: Kathyrn Lass, MD Referring: Kathyrn Lass, MD Visit Date: 09/13/2019 Occupation: @GUAROCC @  Subjective:  Pain in multiple joints   History of Present Illness: Felicia [name] is a 79 y.o. female with history of seropositive rheumatoid arthritis, osteoarthritis, and osteoporosis.  She is on Orenica 125 mg sq injections once weekly, MTX 1 ml sq once weekly, and folic acid 2 mg po daily.  She has not missed any doses recently. She is having increased discomfort in both hands, bilateral trochanteric bursitis, and both knee joints, which has worsened over the past 2 months. She has intermittent swelling in both hands. She has been experiencing nocturnal pain, which has caused interrupted sleep at night.  Her fatigue has been worsening secondary to insomnia.  She is currently going to physical therapy for trochanteric bursitis and bilateral knee joint pain.  She will be extending PT sessions due to the continued discomfort and weakness.  She has ongoing symptoms of neuropathy in bilateral LE.   Activities of Daily Living:  Patient reports morning stiffness for 30 minutes.   Patient Reports nocturnal pain.  Difficulty dressing/grooming: Denies Difficulty climbing stairs: Reports Difficulty getting out of chair: Denies Difficulty using hands for taps, buttons, cutlery, and/or writing: Reports  Review of Systems  Constitutional: Positive for fatigue.  HENT: Positive for mouth dryness.   Eyes: Positive for dryness.  Respiratory: Positive for shortness of breath.   Cardiovascular: Negative for swelling in legs/feet.  Gastrointestinal: Positive for constipation.  Endocrine: Positive for cold intolerance and increased urination.  Genitourinary: Negative for difficulty urinating.  Musculoskeletal: Positive for arthralgias, gait problem, joint pain, joint swelling, muscle weakness,  morning stiffness and muscle tenderness.  Skin: Negative for rash.  Allergic/Immunologic: Negative for susceptible to infections.  Neurological: Positive for numbness and weakness.  Hematological: Negative for bruising/bleeding tendency.  Psychiatric/Behavioral: Positive for sleep disturbance.    PMFS History:  Patient Active Problem List   Diagnosis Date Noted  . Thrombocytosis ([place]) 08/01/2018  . URI (upper respiratory infection) 04/14/2017  . Primary osteoarthritis of both knees 03/06/2017  . Exertional chest pain   . Pain in the chest 03/27/2015  . Postural hypotension 05/26/2013  . OSA (obstructive sleep apnea) 03/22/2013  . Osteoarthritis 11/07/2011  . Abdominal bruit 10/16/2011  . HTN (hypertension) 08/13/2011  . Anisocoria 08/13/2011  . Cervical radiculopathy 07/17/2011  . DDD (degenerative disc disease), cervical 07/10/2011  . Glaucoma 06/06/2011  . HIATAL HERNIA 01/30/2010  . POSTMENOPAUSAL SYNDROME 12/14/2009  . EUSTACHIAN TUBE DYSFUNCTION, RIGHT 06/28/2009  . GERD 12/07/2008  . Rheumatoid arthritis ([place]) 12/07/2008  . COLONIC POLYPS, HX OF 12/07/2008  . Hyperlipidemia LDL goal <70 10/17/2006  . HYPERCALCEMIA 10/17/2006  . Mitral valve disease 10/17/2006    Past Medical History:  Diagnosis Date  . Adenomatous colon polyp 2004   Colo in 2009 "no polyps"  . Complication of anesthesia    small airway per patient  . GERD (gastroesophageal reflux disease)   . Glaucoma   . Hiatal hernia   . Hypertension   . Neuropathy   . Rheumatoid arthritis(714.0)    Orencia; Methotrexate (Dr. Patrecia Pour)  . Sleep apnea    on CPAP    Family History  Problem Relation Age of  Onset  . Dementia Mother   . Lung disease Mother        bronchiectasis  . Heart disease Mother        Aortic Stenosis  . Osteoporosis Mother   . Heart attack Father 69       S/P CBAG  . Stroke Maternal Grandmother 83  . Ulcerative colitis Brother   . Diabetes Neg Hx   . Cancer Neg Hx    Past  Surgical History:  Procedure Laterality Date  . CARDIAC CATHETERIZATION N/A 03/29/2015   Procedure: Left Heart Cath and Coronary Angiography;  Surgeon: Belva Crome, MD;  Location: [facility];  Service: Cardiovascular;  Laterality: N/A;  . CATARACT EXTRACTION, BILATERAL    . COLONOSCOPY W/ POLYPECTOMY     X1; negative subsequently  . DILATION AND CURETTAGE OF UTERUS    . EYE SURGERY     due to RA  . EYE SURGERY Left 06/2017   eyelid tendon   . HYSTEROSCOPY WITH D & C  06/24/2012   Procedure: DILATATION AND CURETTAGE /HYSTEROSCOPY;  Surgeon: [name], MD;  Location: [facility];  Service: Gynecology;  Laterality: N/A;  . SHOULDER SURGERY Left    due to RA  . TRABECULECTOMY     X 2 OD; X 1 OS  . 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 Influenza Split 03/08/2014  . Influenza Whole 04/07/2007, 02/24/2008, 03/10/2009, 03/03/2012  . Influenza, High Dose Seasonal PF 01/15/2016, 03/07/2017  . Influenza,inj,Quad PF,6+ Mos 03/15/2013, 02/21/2015, 03/17/2018  . PFIZER SARS-COV-2 Vaccination 06/24/2019, 07/13/2019  . PPD Test 03/10/2012  . Pneumococcal Conjugate-13 09/12/2015  . Pneumococcal Polysaccharide-23 03/25/2003, 04/12/2008  . Td 12/14/2009  . Zoster Recombinat (Shingrix) 03/23/2018     Objective: Vital Signs: BP (!) 142/70 (BP Location: Left Arm, Patient Position: Sitting, Cuff Size: Normal)   Pulse (!) 57   Resp 16   Ht 5\' 5"  (1.651 m)   Wt 136 lb 3.2 oz (61.8 kg)   BMI 22.66 kg/m    Physical Exam Vitals and nursing note reviewed.  Constitutional:      Appearance: She is well-developed.  HENT:     Head: Normocephalic and atraumatic.  Eyes:     Conjunctiva/sclera: Conjunctivae normal.  Pulmonary:     Effort: Pulmonary effort is normal.  Abdominal:     General:  Bowel sounds are normal.     Palpations: Abdomen is soft.  Musculoskeletal:     Cervical back: Normal range of motion.  Lymphadenopathy:     Cervical: No cervical adenopathy.  Skin:    General: Skin is warm and dry.     Capillary Refill: Capillary refill takes less than 2 seconds.  Neurological:     Mental Status: She is alert and oriented to person, place, and time.  Psychiatric:        Behavior: Behavior normal.      Musculoskeletal Exam: C-spine, thoracic spine, and lumbar spine good ROM.  No midline spinal tenderness.  Shoulder joints, elbow joints, wrist joints, MCPs, PIPs, and DIPs good ROM with no synovitis.  PIP and DIP thickening consistent with osteoarthritis of both hands.  Complete fist formation bilaterally. Hip joints good ROM with no discomfort.  Tenderness over bilateral trochanteric bursa.  Knee joints good ROM  with no discomfort.  No warmth or effusion of knee joints.  Ankle joints good ROM with no discomfort.  No tenderness or inflammation of ankle joints.   CDAI Exam: CDAI Score: -- Patient Global: --; Provider Global: -- Swollen: --; Tender: -- Joint Exam 09/13/2019   No joint exam has been documented for this visit   There is currently no information documented on the homunculus. Go to the Rheumatology activity and complete the homunculus joint exam.  Investigation: No additional findings.  Imaging: No results found.  Recent Labs: Lab Results  Component Value Date   WBC 9.6 07/30/2019   HGB 12.4 07/30/2019   PLT 466 (H) 07/30/2019   NA 131 (L) 07/30/2019   K 4.8 07/30/2019   CL 97 (L) 07/30/2019   CO2 25 07/30/2019   GLUCOSE 94 07/30/2019   BUN 16 07/30/2019   CREATININE 0.81 07/30/2019   BILITOT 0.5 07/30/2019   ALKPHOS 75 07/28/2019   AST 19 07/30/2019   ALT 13 07/30/2019   PROT 7.0 07/30/2019   PROT 6.5 07/30/2019   ALBUMIN 4.1 07/28/2019   CALCIUM 10.2 07/30/2019   GFRAA 81 07/30/2019   QFTBGOLDPLUS NEGATIVE 12/02/2018    Speciality  Comments: TB gold neg 11/07/17  Procedures:  No procedures performed Allergies: Sulfonamide derivatives, Amlodipine, Zostavax [zoster vaccine live], and Norvasc [amlodipine besylate]     Assessment / Plan:     Visit Diagnoses: Rheumatoid arthritis involving multiple sites with positive rheumatoid factor ([place]) - RF+, ANA +:She has no synovitis or tenderness on exam.  She is clinically doing well on Orencia 125 mg sq injections once weekly, MTX 0.8 ml sq once weekly, and folic acid 2 mg po daily.  She has not missed any doses recently.  She has been experiencing increased pain in both hands and both knee joints due to underlying osteoarthritis.  She has PIP and DIP thickening but no inflammation.  Bilateral knee joints have good ROM with no warmth or effusion on exam.  She will continue on Orencia 125 mg sq injections once weekly, MTX 0.8 ml sq once weekly, and folic acid 2 mg po daily.  A refill of MTX will be sent to the pharmacy.  She was advised to notify us if she develops increased joint pain or joint swelling.  She will follow up in 3 months.   High risk medication use - Orencia 125 mg every 7 days, methotrexate 0.8 mL every 7 days, and folic acid 1 mg 2 tablets daily.CBC and CMP were drawn on 07/30/19.  She is due to update lab work in May and every 3 months.  Standing orders for CBC and CMP are in place.  TB gold negative on 12/02/18.  Future order for TB gold placed today.     - Plan: QuantiFERON-TB Gold Plus  Primary osteoarthritis of both hands: She has PIP and DIP thickening consistent with osteoarthritis of both hands.  No tenderness or synovitis noted.  Joint protection and muscle strengthening were discussed.   Primary osteoarthritis of both knees: Chronic pain.  She had bilateral monovisc injections on 07/19/19.  She does not feel like Monovisc has been as effective as Hyalgan was in the past.  Next time we apply for Visco gel injections she would like to reapply for Hyalgan injections for  both knees.  Bilateral knee joints have good ROM with no discomfort.  No warmth or effusion of knee joints noted.  She is currently going to physical therapy.   She was given  a prescription for knee joint braces.   Trochanteric bursitis, left hip: She has tenderness over the left trochanter bursa.  She is been experiencing increased discomfort and difficulty lying on her left side at night.  She has been going to physical therapy at [facility] and will be extending the sessions due to having ongoing discomfort.  She is not a good candidate for cortisone injections due to her history of glaucoma.  She was encouraged to continue to perform stretching exercises on a daily basis.  We also discussed the importance of a supportive, comfortable mattress.  DDD (degenerative disc disease), cervical: She has good range of motion with no discomfort.  No symptoms of radiculopathy.  DDD (degenerative disc disease), lumbar: She experiences intermittent lower back pain.  No midline spinal tenderness.  Age-related osteoporosis without current pathological fracture - DEXA 07/26/2019 right 1/3 distal radius BMD 0.54 with T score -2.5.  -3% change in BMD for right 1/3 radius.  She had a -10% change in BMD for LFN.  She had her first Reclast IV infusion recently and experiencing some arthralgias for 3 to 4 days after.  She continues taking calcium and vitamin D supplement.  Neuropathy: She has ongoing symptoms of neuropathy in bilateral lower extremities.  She takes gabapentin as prescribed.  She continues to follow-up with Dr. Leta Baptist.  Other medical conditions are listed as follows:  SIADH (syndrome of inappropriate ADH production) ([place])  History of sleep apnea  History of hyperlipidemia  History of glaucoma  History of colonic polyps  History of gastroesophageal reflux (GERD)  Orders: Orders Placed This Encounter  Procedures  . QuantiFERON-TB Gold Plus   Meds ordered this encounter    Medications  . methotrexate 50 MG/2ML injection    Sig: ADMINISTER 0.8 ML(20 MG) UNDER THE SKIN 1 TIME A WEEK    Dispense:  10 mL    Refill:  0    Face-to-face time spent with patient was 30 minutes. Greater than 50% of time was spent in counseling and coordination of care.  Follow-Up Instructions: Return in about 3 months (around 12/13/2019) for Rheumatoid arthritis, Osteoarthritis.   Ofilia Neas, PA-C   I examined and evaluated the patient with Hazel Sams PA.  Patient was tearful during the conversation due to ongoing pain and discomfort.  She believes her rheumatoid arthritis is flaring.  She had no synovitis on my examination.  She is doing quite well on combination of Orencia and methotrexate.  She has been having discomfort in the trochanteric area which is causing nocturnal pain and insomnia.  She has been also experiencing fatigue.  She has a firm mattress.  We discussed changing mattress or getting a pillow top for her mattress to relieve pain.  The plan of care was discussed as noted above.  Bo Merino, MD  Note - This record has been created using Editor, commissioning.  Chart creation errors have been sought, but may not always  have been located. Such creation errors do not reflect on  the standard of medical care.

## 2019-09-13 ENCOUNTER — Ambulatory Visit: Payer: Medicare Other | Admitting: Rheumatology

## 2019-09-13 ENCOUNTER — Other Ambulatory Visit: Payer: Self-pay

## 2019-09-13 ENCOUNTER — Other Ambulatory Visit: Payer: Self-pay | Admitting: Rheumatology

## 2019-09-13 ENCOUNTER — Encounter: Payer: Self-pay | Admitting: Rheumatology

## 2019-09-13 VITALS — BP 142/70 | HR 57 | Resp 16 | Ht 65.0 in | Wt 136.2 lb

## 2019-09-13 DIAGNOSIS — M5030 Other cervical disc degeneration, unspecified cervical region: Secondary | ICD-10-CM

## 2019-09-13 DIAGNOSIS — M19041 Primary osteoarthritis, right hand: Secondary | ICD-10-CM

## 2019-09-13 DIAGNOSIS — Z8669 Personal history of other diseases of the nervous system and sense organs: Secondary | ICD-10-CM

## 2019-09-13 DIAGNOSIS — Z79899 Other long term (current) drug therapy: Secondary | ICD-10-CM | POA: Diagnosis not present

## 2019-09-13 DIAGNOSIS — G629 Polyneuropathy, unspecified: Secondary | ICD-10-CM

## 2019-09-13 DIAGNOSIS — E222 Syndrome of inappropriate secretion of antidiuretic hormone: Secondary | ICD-10-CM

## 2019-09-13 DIAGNOSIS — M19042 Primary osteoarthritis, left hand: Secondary | ICD-10-CM

## 2019-09-13 DIAGNOSIS — Z8639 Personal history of other endocrine, nutritional and metabolic disease: Secondary | ICD-10-CM

## 2019-09-13 DIAGNOSIS — M7062 Trochanteric bursitis, left hip: Secondary | ICD-10-CM

## 2019-09-13 DIAGNOSIS — M170 Bilateral primary osteoarthritis of knee: Secondary | ICD-10-CM

## 2019-09-13 DIAGNOSIS — M0579 Rheumatoid arthritis with rheumatoid factor of multiple sites without organ or systems involvement: Secondary | ICD-10-CM

## 2019-09-13 DIAGNOSIS — Z860100 Personal history of colon polyps, unspecified: Secondary | ICD-10-CM

## 2019-09-13 DIAGNOSIS — M51369 Other intervertebral disc degeneration, lumbar region without mention of lumbar back pain or lower extremity pain: Secondary | ICD-10-CM

## 2019-09-13 DIAGNOSIS — M5136 Other intervertebral disc degeneration, lumbar region: Secondary | ICD-10-CM

## 2019-09-13 DIAGNOSIS — M810 Age-related osteoporosis without current pathological fracture: Secondary | ICD-10-CM

## 2019-09-13 DIAGNOSIS — Z8719 Personal history of other diseases of the digestive system: Secondary | ICD-10-CM

## 2019-09-13 MED ORDER — METHOTREXATE SODIUM CHEMO INJECTION 50 MG/2ML: INTRAMUSCULAR | 0 refills | Status: DC

## 2019-10-18 ENCOUNTER — Other Ambulatory Visit: Payer: Self-pay | Admitting: *Deleted

## 2019-10-18 DIAGNOSIS — Z79899 Other long term (current) drug therapy: Secondary | ICD-10-CM

## 2019-10-19 LAB — CBC WITH DIFFERENTIAL/PLATELET
Absolute Monocytes: 666 cells/uL (ref 200–950)
Basophils Absolute: 72 cells/uL (ref 0–200)
Basophils Relative: 0.8 %
Eosinophils Absolute: 63 cells/uL (ref 15–500)
Eosinophils Relative: 0.7 %
HCT: 34.8 % — ABNORMAL LOW (ref 35.0–45.0)
Hemoglobin: 11.6 g/dL — ABNORMAL LOW (ref 11.7–15.5)
Lymphs Abs: 1998 cells/uL (ref 850–3900)
MCH: 31.3 pg (ref 27.0–33.0)
MCHC: 33.3 g/dL (ref 32.0–36.0)
MCV: 93.8 fL (ref 80.0–100.0)
MPV: 10.3 fL (ref 7.5–12.5)
Monocytes Relative: 7.4 %
Neutro Abs: 6201 cells/uL (ref 1500–7800)
Neutrophils Relative %: 68.9 %
Platelets: 378 10*3/uL (ref 140–400)
RBC: 3.71 10*6/uL — ABNORMAL LOW (ref 3.80–5.10)
RDW: 14.1 % (ref 11.0–15.0)
Total Lymphocyte: 22.2 %
WBC: 9 10*3/uL (ref 3.8–10.8)

## 2019-10-19 LAB — COMPLETE METABOLIC PANEL WITH GFR
AG Ratio: 1.6 (calc) (ref 1.0–2.5)
ALT: 15 U/L (ref 6–29)
AST: 24 U/L (ref 10–35)
Albumin: 3.8 g/dL (ref 3.6–5.1)
Alkaline phosphatase (APISO): 62 U/L (ref 37–153)
BUN: 12 mg/dL (ref 7–25)
CO2: 19 mmol/L — ABNORMAL LOW (ref 20–32)
Calcium: 9.4 mg/dL (ref 8.6–10.4)
Chloride: 97 mmol/L — ABNORMAL LOW (ref 98–110)
Creat: 0.79 mg/dL (ref 0.60–0.93)
GFR, Est African American: 84 mL/min/{1.73_m2} (ref >=60)
GFR, Est Non African American: 72 mL/min/{1.73_m2} (ref >=60)
Globulin: 2.4 g/dL (calc) (ref 1.9–3.7)
Glucose, Bld: 91 mg/dL (ref 65–99)
Potassium: 4.9 mmol/L (ref 3.5–5.3)
Sodium: 127 mmol/L — ABNORMAL LOW (ref 135–146)
Total Bilirubin: 0.6 mg/dL (ref 0.2–1.2)
Total Protein: 6.2 g/dL (ref 6.1–8.1)

## 2019-10-19 NOTE — Progress Notes (Signed)
RBC count, hgb, and hct are borderline low.  Please notify the patient and advise the patient to discuss with PCP.   Sodium and chloride are low and trending down.  Please refer the patient to endocrinology for further evaluation.

## 2019-10-20 ENCOUNTER — Other Ambulatory Visit: Payer: Medicare PPO | Admitting: *Deleted

## 2019-10-20 ENCOUNTER — Other Ambulatory Visit: Payer: Self-pay

## 2019-10-20 DIAGNOSIS — E785 Hyperlipidemia, unspecified: Secondary | ICD-10-CM

## 2019-10-20 LAB — HEPATIC FUNCTION PANEL
ALT: 18 IU/L (ref 0–32)
AST: 23 IU/L (ref 0–40)
Albumin: 4.1 g/dL (ref 3.7–4.7)
Alkaline Phosphatase: 71 IU/L (ref 48–121)
Bilirubin Total: 0.4 mg/dL (ref 0.0–1.2)
Bilirubin, Direct: 0.11 mg/dL (ref 0.00–0.40)
Total Protein: 6.3 g/dL (ref 6.0–8.5)

## 2019-10-20 LAB — LIPID PANEL
Chol/HDL Ratio: 2.5 ratio (ref 0.0–4.4)
Cholesterol, Total: 146 mg/dL (ref 100–199)
HDL: 59 mg/dL (ref >39)
LDL Chol Calc (NIH): 72 mg/dL (ref 0–99)
Triglycerides: 78 mg/dL (ref 0–149)
VLDL Cholesterol Cal: 15 mg/dL (ref 5–40)

## 2019-10-21 ENCOUNTER — Other Ambulatory Visit: Payer: Self-pay | Admitting: Rheumatology

## 2019-10-21 ENCOUNTER — Other Ambulatory Visit: Payer: Self-pay

## 2019-10-21 DIAGNOSIS — M0579 Rheumatoid arthritis with rheumatoid factor of multiple sites without organ or systems involvement: Secondary | ICD-10-CM

## 2019-10-21 DIAGNOSIS — E871 Hypo-osmolality and hyponatremia: Secondary | ICD-10-CM

## 2019-10-21 NOTE — Telephone Encounter (Signed)
Ok to refill [place]

## 2019-10-21 NOTE — Telephone Encounter (Signed)
Last Visit: 09/13/2019 Next Visit: 12/21/2019 Labs: 10/18/2019 RBC count, hgb, and hct are borderline low. Sodium and chloride are low and trending down. TB Gold: 12/02/2018 negative   Okay to refill orencia?

## 2019-10-25 ENCOUNTER — Other Ambulatory Visit: Payer: Medicare PPO

## 2019-11-17 NOTE — Progress Notes (Addendum)
Name: Felicia [name]  MRN/ DOB: [id number], 1940/11/11    Age/ Sex: 79 y.o., female    PCP: Kathyrn Lass, MD   Reason for Endocrinology Evaluation: Hyponatremia      Date of Initial Endocrinology Evaluation: 11/18/2019     HPI: Felicia [name] is a 79 y.o. female with a past medical history of HTN, RA and dyslipidemia. The patient presented for initial endocrinology clinic visit on 11/18/2019 for consultative assistance with her Hyponatremia   Pt has been noted with hyponatremia since 2013. This has been fluctuating over the years.   Denies lightheaded , nausea or vomiting.   She has not been on SSRI or SNRI nor diuretics  Has episodic hypertension and takes antihypertensive medications  Denies lower extremity edema   Has frequency , has nocturia x2 . During the day its every 1.5 hrs.   Has occasional polydipsia  Has been on 50 ounce of liquid a day but this has been difficult , ~ 64 ounce a day      HISTORY:  Past Medical History:  Past Medical History:  Diagnosis Date  . Adenomatous colon polyp 2004   Colo in 2009 "no polyps"  . Complication of anesthesia    small airway per patient  . GERD (gastroesophageal reflux disease)   . Glaucoma   . Hiatal hernia   . Hypertension   . Neuropathy   . Rheumatoid arthritis(714.0)    Orencia; Methotrexate (Dr. Patrecia Pour)  . Sleep apnea    on CPAP   Past Surgical History:  Past Surgical History:  Procedure Laterality Date  . CARDIAC CATHETERIZATION N/A 03/29/2015   Procedure: Left Heart Cath and Coronary Angiography;  Surgeon: Belva Crome, MD;  Location: [facility];  Service: Cardiovascular;  Laterality: N/A;  . CATARACT EXTRACTION, BILATERAL    . COLONOSCOPY W/ POLYPECTOMY     X1; negative subsequently  . DILATION AND CURETTAGE OF UTERUS    . EYE SURGERY     due to RA  . EYE SURGERY Left 06/2017   eyelid tendon   . HYSTEROSCOPY WITH D & C  06/24/2012   Procedure: DILATATION AND CURETTAGE /HYSTEROSCOPY;   Surgeon: [name], MD;  Location: [facility];  Service: Gynecology;  Laterality: N/A;  . SHOULDER SURGERY Left    due to RA  . TRABECULECTOMY     X 2 OD; X 1 OS  . UPPER GASTROINTESTINAL ENDOSCOPY     hiatal hernia      Social History:  reports that she quit smoking about 48 years ago. Her smoking use included cigarettes. She has a 2.50 pack-year smoking history. She has never used smokeless tobacco. She reports that she does not drink alcohol and does not use drugs.  Family History: family history includes Dementia in her mother; Heart attack (age of onset: 69) in her father; Heart disease in her mother; Lung disease in her mother; Osteoporosis in her mother; Stroke (age of onset: 24) in her maternal grandmother; Ulcerative colitis in her brother.   HOME MEDICATIONS: Allergies as of 11/18/2019      Reactions   Sulfonamide Derivatives    Rash, redness   Amlodipine Swelling   Zostavax [zoster Vaccine Live]    Unable to get due to RA, per patient    Norvasc [amlodipine Besylate]    edema      Medication List       Accurate as of November 18, 2019 12:28 PM. If you have any  questions, ask your nurse or doctor.        BENEFIBER DRINK MIX PO Take at bedtime by mouth.   Caltrate 600+D 600-400 MG-UNIT tablet Generic drug: Calcium Carbonate-Vitamin D Take 2 tablets by mouth daily.   esomeprazole 40 MG capsule Commonly known as: NEXIUM TAKE ONE CAPSULE BY MOUTH TWICE DAILY BEFORE A MEAL   estradiol 1 MG tablet Commonly known as: ESTRACE Take 1 mg by mouth daily.   folic acid 1 MG tablet Commonly known as: FOLVITE Take 2 tablets (2 mg total) by mouth daily.   gabapentin 100 MG capsule Commonly known as: NEURONTIN Take 1 capsule (100 mg total) by mouth 3 (three) times daily.   Krill Oil Omega-3 300 MG Caps Take 1 capsule by mouth daily.   latanoprost 0.005 % ophthalmic solution Commonly known as: XALATAN Place 1 drop at bedtime into the right eye.   losartan 100  MG tablet Commonly known as: COZAAR Take 100 mg by mouth daily.   methotrexate 50 MG/2ML injection ADMINISTER 0.8 ML(20 MG) UNDER THE SKIN 1 TIME A WEEK   metoprolol tartrate 50 MG tablet Commonly known as: LOPRESSOR Take 50 mg by mouth daily.   metroNIDAZOLE 0.75 % gel Commonly known as: METROGEL Apply 1 application topically 2 (two) times daily.   MULTIPLE VITAMIN PO Take by mouth.   Orencia 125 MG/ML [place] Generic drug: Abatacept INJECT 125MG  SUBCUTANEOUSLY WEEKLY   pravastatin 20 MG tablet Commonly known as: PRAVACHOL Take 1 tablet (20 mg total) by mouth every evening.   PREVAGEN PO Take by mouth daily.   progesterone 200 MG capsule Commonly known as: PROMETRIUM Take 200 mg by mouth daily.   REFRESH OP Apply 1 drop to eye daily as needed (FOR DRY EYES).   timolol 0.5 % ophthalmic solution Commonly known as: TIMOPTIC Place 1 drop into both eyes daily.   TUBERCULIN SYR 1CC/27GX1/2" 27G X 1/2" 1 ML Misc Commonly known as: B-D TB SYRINGE 1CC/27GX1/2" FOR USE WITH METHOTREXATE   valACYclovir 1000 MG tablet Commonly known as: VALTREX Take 1,000 mg by mouth as needed (fever blister). Reported on 06/14/2015   Vitamin B-12 2500 MCG Subl Place under the tongue.   Vitamin D3 25 MCG (1000 UT) Caps Take 1 capsule by mouth daily.   Voltaren 1 % Gel Generic drug: diclofenac Sodium Voltaren 1 % topical gel  apply 3 grams to LARGE JOINTS UP TO THREE TIMES A DAY           OBJECTIVE:  VS: BP 124/78 (BP Location: Left Arm, Patient Position: Sitting, Cuff Size: Normal)   Pulse (!) 59   Ht 5\' 4"  (1.626 m)   Wt 134 lb (60.8 kg)   SpO2 98%   BMI 23.00 kg/m    Wt Readings from Last 3 Encounters:  11/18/19 134 lb (60.8 kg)  09/13/19 136 lb 3.2 oz (61.8 kg)  08/17/19 134 lb (60.8 kg)    EXAM: General: Pt appears well and is in NAD  Neck: General: Supple without adenopathy. Thyroid: Thyroid size normal.  No goiter or nodules appreciated.   Lungs: Clear with  good BS bilat with no rales, rhonchi, or wheezes  Heart: Auscultation: RRR.  Abdomen: Normoactive bowel sounds, soft, nontender, without masses or organomegaly palpable  Extremities:  BL LE: Trace pretibial edema   Skin: Hair: Texture and amount normal with gender appropriate distribution Skin Inspection: No rashes Skin Palpation: Skin temperature, texture, and thickness normal to palpation  Neuro: Cranial nerves: II - XII grossly  intact  Motor: Normal strength throughout DTRs: 2+ and symmetric in UE without delay in relaxation phase  Mental Status: Judgment, insight: Intact Orientation: Oriented to time, place, and person Mood and affect: No depression, anxiety, or agitation     DATA REVIEWED:    Results for Gravlin, Jennilee E "Briyah Bosserman" (MRN [id number]) as of 11/18/2019 13:04  Ref. Range 11/18/2019 08:55  Sodium Latest Ref Range: 135 - 145 mEq/L 130 (L)  Potassium Latest Ref Range: 3.5 - 5.1 mEq/L 4.7  Chloride Latest Ref Range: 96 - 112 mEq/L 100  CO2 Latest Ref Range: 19 - 32 mEq/L 25  Glucose Latest Ref Range: 70 - 99 mg/dL 97  BUN Latest Ref Range: 6 - 23 mg/dL 16  Creatinine Latest Ref Range: 0.40 - 1.20 mg/dL 0.73  Calcium Latest Ref Range: 8.4 - 10.5 mg/dL 9.3  Alkaline Phosphatase Latest Ref Range: 39 - 117 U/L 74  Albumin Latest Ref Range: 3.5 - 5.2 g/dL 4.1  AST Latest Ref Range: 0 - 37 U/L 19  ALT Latest Ref Range: 0 - 35 U/L 17  Total Protein Latest Ref Range: 6.0 - 8.3 g/dL 6.9  Total Bilirubin Latest Ref Range: 0.2 - 1.2 mg/dL 0.3  GGT Latest Ref Range: 7 - 51 U/L 25  GFR Latest Ref Range: >60.00 mL/min 77.09     ASSESSMENT/PLAN/RECOMMENDATIONS:   1. Hyponatremia :  - Differential diagnosis include SIADH or low effective arterial blood volume - This is chronic hyponatremia and the goal for serum sodium is 130 mEq/L  - repeat BMP shows improved serum sodium at 130. - Awaiting on  serum and urine osmolarity as well as urine sodium - We discussed limiting fluid  intake to 34 ounce daily, pt does not believe she can stick with this, she had a hard time sticking with 50 ounce a day.  - TFT's have been normal - Will proceed with cosyntropin stimulation test for adrenal insufficieny- pt was unable to stay for this today, will be scheduled for this.    F/U in 3 months       Signed electronically by: Mack Guise, MD  [facility]  [address] [place], [place] [address] Phone: [phone] FAX: [phone]   CC: Kathyrn Lass, [place] [address] Phone: [phone] Fax: [phone]   Return to Endocrinology clinic as below: Future Appointments  Date Time Provider [place]  11/26/2019 10:15 AM [facility] NURSE [facility] None  12/21/2019 10:45 AM Bo Merino, MD [facility] None  02/18/2020 10:10 AM Eugene Zeiders, Melanie Crazier, MD [facility] None  06/28/2020 11:00 AM Debbora Presto, NP [facility] None

## 2019-11-18 ENCOUNTER — Ambulatory Visit: Payer: Medicare PPO | Admitting: Internal Medicine

## 2019-11-18 ENCOUNTER — Encounter: Payer: Self-pay | Admitting: Internal Medicine

## 2019-11-18 ENCOUNTER — Other Ambulatory Visit: Payer: Self-pay

## 2019-11-18 VITALS — BP 124/78 | HR 59 | Ht 64.0 in | Wt 134.0 lb

## 2019-11-18 DIAGNOSIS — E871 Hypo-osmolality and hyponatremia: Secondary | ICD-10-CM | POA: Insufficient documentation

## 2019-11-18 LAB — COMPREHENSIVE METABOLIC PANEL
ALT: 17 U/L (ref 0–35)
AST: 19 U/L (ref 0–37)
Albumin: 4.1 g/dL (ref 3.5–5.2)
Alkaline Phosphatase: 74 U/L (ref 39–117)
BUN: 16 mg/dL (ref 6–23)
CO2: 25 mEq/L (ref 19–32)
Calcium: 9.3 mg/dL (ref 8.4–10.5)
Chloride: 100 mEq/L (ref 96–112)
Creatinine, Ser: 0.73 mg/dL (ref 0.40–1.20)
GFR: 77.09 mL/min (ref >60.00)
Glucose, Bld: 97 mg/dL (ref 70–99)
Potassium: 4.7 mEq/L (ref 3.5–5.1)
Sodium: 130 mEq/L — ABNORMAL LOW (ref 135–145)
Total Bilirubin: 0.3 mg/dL (ref 0.2–1.2)
Total Protein: 6.9 g/dL (ref 6.0–8.3)

## 2019-11-18 LAB — GAMMA GT: GGT: 25 U/L (ref 7–51)

## 2019-11-18 NOTE — Patient Instructions (Addendum)
-   Please stop by the labs today  - Please try and limit liquid intake to 34 ounce a day

## 2019-11-20 LAB — OSMOLALITY, URINE: Osmolality, Ur: 302 mOsm/kg (ref 50–1200)

## 2019-11-20 LAB — EXTRA URINE SPECIMEN

## 2019-11-20 LAB — SODIUM, URINE, RANDOM: Sodium, Ur: 42 mmol/L (ref 28–272)

## 2019-11-20 LAB — OSMOLALITY: Osmolality: 277 mOsm/kg — ABNORMAL LOW (ref 278–305)

## 2019-11-26 ENCOUNTER — Ambulatory Visit (INDEPENDENT_AMBULATORY_CARE_PROVIDER_SITE_OTHER): Payer: Medicare PPO

## 2019-11-26 ENCOUNTER — Telehealth: Payer: Self-pay

## 2019-11-26 ENCOUNTER — Other Ambulatory Visit: Payer: Self-pay | Admitting: Internal Medicine

## 2019-11-26 ENCOUNTER — Other Ambulatory Visit (INDEPENDENT_AMBULATORY_CARE_PROVIDER_SITE_OTHER): Payer: Medicare PPO

## 2019-11-26 ENCOUNTER — Other Ambulatory Visit: Payer: Self-pay

## 2019-11-26 DIAGNOSIS — E871 Hypo-osmolality and hyponatremia: Secondary | ICD-10-CM

## 2019-11-26 LAB — CORTISOL
Cortisol, Plasma: 11.4 ug/dL
Cortisol, Plasma: 23.9 ug/dL
Cortisol, Plasma: 29.4 ug/dL

## 2019-11-26 MED ORDER — COSYNTROPIN 0.25 MG IJ SOLR
0.2500 mg | Freq: Once | INTRAMUSCULAR | Status: AC
  Administered 2019-11-26: 0.25 mg via INTRAMUSCULAR

## 2019-11-26 NOTE — Progress Notes (Signed)
Per orders of Dr.Shamleffer injection of cortrosyn 0.25 mg given today in rt arm by Lolita Rieger RMA . Patient tolerated injection well.

## 2019-11-26 NOTE — Telephone Encounter (Signed)
Pt informed

## 2019-11-26 NOTE — Telephone Encounter (Signed)
Pt stated that she has been feeling as if she has been dehydrated due to fluid restrictions and wanted to know if she needed to make any changes. Pt complaining of dizziness and feeling faint. Please advise.

## 2019-11-29 ENCOUNTER — Other Ambulatory Visit: Payer: Self-pay | Admitting: Internal Medicine

## 2019-11-29 DIAGNOSIS — E871 Hypo-osmolality and hyponatremia: Secondary | ICD-10-CM

## 2019-12-01 ENCOUNTER — Telehealth: Payer: Self-pay | Admitting: Rheumatology

## 2019-12-01 NOTE — Telephone Encounter (Signed)
Patient left a voicemail requesting a return call to confirm that she needs to come in tomorrow 12/02/19 for her TB Gold test.

## 2019-12-01 NOTE — Telephone Encounter (Signed)
Patient advised she does need to come to office for a TB Gold. Patient advised she may come on 12/03/2019 to have that draw. Patient reminded of lab hours.

## 2019-12-02 ENCOUNTER — Encounter: Payer: Self-pay | Admitting: Internal Medicine

## 2019-12-03 ENCOUNTER — Other Ambulatory Visit: Payer: Self-pay

## 2019-12-03 DIAGNOSIS — Z79899 Other long term (current) drug therapy: Secondary | ICD-10-CM

## 2019-12-04 LAB — QUANTIFERON-TB GOLD PLUS
Mitogen-NIL: >10 IU/mL
NIL: 0.02 IU/mL
QuantiFERON-TB Gold Plus: NEGATIVE
TB1-NIL: 0 IU/mL
TB2-NIL: 0.01 IU/mL

## 2019-12-07 ENCOUNTER — Other Ambulatory Visit: Payer: Self-pay | Admitting: *Deleted

## 2019-12-07 MED ORDER — METHOTREXATE SODIUM CHEMO INJECTION 50 MG/2ML: INTRAMUSCULAR | 0 refills | Status: DC

## 2019-12-07 NOTE — Telephone Encounter (Signed)
Refill request received via fax  Last Visit: 09/13/2019 Next Visit: 12/21/2019 Labs: 10/18/2019 RBC count, hgb, and hct are borderline low. Sodium and chloride are low and trending down.  Current Dose per office note on 09/13/2019: MTX 0.8 ml sq once weekly  Okay to refill MTX?

## 2019-12-07 NOTE — Progress Notes (Signed)
TB gold negative

## 2019-12-08 NOTE — Progress Notes (Signed)
Office Visit Note  Patient: Felicia [name]             Date of Birth: April 17, 1941           MRN: [id number]             PCP: Kathyrn Lass, MD Referring: Kathyrn Lass, MD Visit Date: 12/21/2019 Occupation: @GUAROCC @  Subjective:  Pain in joints.   History of Present Illness: Felicia [name] is a 79 y.o. female with history of rheumatoid arthritis and osteoarthritis.  She states she has been doing well as regards to her rheumatoid arthritis.  She continues to have some discomfort in her joints and stiffness.  She states she has got a new mattress which has been helpful.  She also went for physical therapy for 2 months which helped her a lot with the lower extremities muscle strengthening.  She went to see the endocrinologist and was given water restriction which has been very hard for her.  She has a follow-up appointment coming up in September.  Activities of Daily Living:  Patient reports morning stiffness for 30-60 minutes.   Patient Denies nocturnal pain.  Difficulty dressing/grooming: Denies Difficulty climbing stairs: Reports Difficulty getting out of chair: Denies Difficulty using hands for taps, buttons, cutlery, and/or writing: Reports  Review of Systems  Constitutional: Positive for fatigue.  HENT: Positive for mouth dryness. Negative for mouth sores and nose dryness.   Eyes: Positive for dryness. Negative for pain and itching.  Respiratory: Negative for shortness of breath and difficulty breathing.   Cardiovascular: Negative for chest pain and palpitations.  Gastrointestinal: Negative for blood in stool, constipation and diarrhea.  Endocrine: Negative for increased urination.  Genitourinary: Negative for difficulty urinating.  Musculoskeletal: Positive for arthralgias, joint pain, joint swelling, myalgias, morning stiffness, muscle tenderness and myalgias.  Skin: Negative for color change, rash and redness.  Allergic/Immunologic: Negative for susceptible to infections.    Neurological: Positive for numbness. Negative for dizziness, headaches, memory loss and weakness.  Hematological: Positive for bruising/bleeding tendency.  Psychiatric/Behavioral: Negative for confusion.    PMFS History:  Patient Active Problem List   Diagnosis Date Noted  . Hyponatremia 11/18/2019  . Thrombocytosis ([place]) 08/01/2018  . URI (upper respiratory infection) 04/14/2017  . Primary osteoarthritis of both knees 03/06/2017  . Exertional chest pain   . Pain in the chest 03/27/2015  . Postural hypotension 05/26/2013  . OSA (obstructive sleep apnea) 03/22/2013  . Osteoarthritis 11/07/2011  . Abdominal bruit 10/16/2011  . HTN (hypertension) 08/13/2011  . Anisocoria 08/13/2011  . Cervical radiculopathy 07/17/2011  . DDD (degenerative disc disease), cervical 07/10/2011  . Glaucoma 06/06/2011  . HIATAL HERNIA 01/30/2010  . POSTMENOPAUSAL SYNDROME 12/14/2009  . EUSTACHIAN TUBE DYSFUNCTION, RIGHT 06/28/2009  . GERD 12/07/2008  . Rheumatoid arthritis ([place]) 12/07/2008  . COLONIC POLYPS, HX OF 12/07/2008  . Hyperlipidemia LDL goal <70 10/17/2006  . HYPERCALCEMIA 10/17/2006  . Mitral valve disease 10/17/2006    Past Medical History:  Diagnosis Date  . Adenomatous colon polyp 2004   Colo in 2009 "no polyps"  . Complication of anesthesia    small airway per patient  . GERD (gastroesophageal reflux disease)   . Glaucoma   . Hiatal hernia   . Hypertension   . Neuropathy   . Rheumatoid arthritis(714.0)    Orencia; Methotrexate (Dr. Patrecia Pour)  . Sleep apnea    on CPAP    Family History  Problem Relation Age of Onset  . Dementia Mother   . Lung  disease Mother        bronchiectasis  . Heart disease Mother        Aortic Stenosis  . Osteoporosis Mother   . Heart attack Father 31       S/P CBAG  . Stroke Maternal Grandmother 83  . Ulcerative colitis Brother   . Diabetes Neg Hx   . Cancer Neg Hx    Past Surgical History:  Procedure Laterality Date  . CARDIAC  CATHETERIZATION N/A 03/29/2015   Procedure: Left Heart Cath and Coronary Angiography;  Surgeon: Belva Crome, MD;  Location: [facility];  Service: Cardiovascular;  Laterality: N/A;  . CATARACT EXTRACTION, BILATERAL    . COLONOSCOPY W/ POLYPECTOMY     X1; negative subsequently  . DILATION AND CURETTAGE OF UTERUS    . EYE SURGERY     due to RA  . EYE SURGERY Left 06/2017   eyelid tendon   . HYSTEROSCOPY WITH D & C  06/24/2012   Procedure: DILATATION AND CURETTAGE /HYSTEROSCOPY;  Surgeon: [name], MD;  Location: [facility];  Service: Gynecology;  Laterality: N/A;  . SHOULDER SURGERY Left    due to RA  . TRABECULECTOMY     X 2 OD; X 1 OS  . 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 Influenza Split 03/08/2014  . Influenza Whole 04/07/2007, 02/24/2008, 03/10/2009, 03/03/2012  . Influenza, High Dose Seasonal PF 01/15/2016, 03/07/2017  . Influenza,inj,Quad PF,6+ Mos 03/15/2013, 02/21/2015, 03/17/2018  . PFIZER SARS-COV-2 Vaccination 06/24/2019, 07/13/2019  . PPD Test 03/10/2012  . Pneumococcal Conjugate-13 09/12/2015  . Pneumococcal Polysaccharide-23 03/25/2003, 04/12/2008  . Td 12/14/2009  . Zoster Recombinat (Shingrix) 03/23/2018     Objective: Vital Signs: BP (!) 146/69 (BP Location: Left Arm, Patient Position: Sitting, Cuff Size: Normal)   Pulse (!) 56   Resp 14   Ht 5\' 5"  (1.651 m)   Wt 134 lb 3.2 oz (60.9 kg)   BMI 22.33 kg/m    Physical Exam Vitals and nursing note reviewed.  Constitutional:      Appearance: She is well-developed.  HENT:     Head: Normocephalic and atraumatic.  Eyes:     Conjunctiva/sclera: Conjunctivae normal.  Cardiovascular:     Rate and Rhythm: Normal rate and regular rhythm.     Heart sounds: Normal heart sounds.  Pulmonary:     Effort:  Pulmonary effort is normal.     Breath sounds: Normal breath sounds.  Abdominal:     General: Bowel sounds are normal.     Palpations: Abdomen is soft.  Musculoskeletal:     Cervical back: Normal range of motion.  Lymphadenopathy:     Cervical: No cervical adenopathy.  Skin:    General: Skin is warm and dry.     Capillary Refill: Capillary refill takes less than 2 seconds.  Neurological:     Mental Status: She is alert and oriented to person, place, and time.  Psychiatric:        Behavior: Behavior normal.      Musculoskeletal Exam: She has some limitation of range of motion of cervical and lumbar spine without any discomfort.  Shoulder joints, elbow joints were in good range of motion.  She has bilateral CMC subluxation.  She has some MCP thickening but  no synovitis.  PIP and DIP prominence was noted.  Hip joints, knee joints with good range of motion.  She had no tenderness across ankles or MTPs.  No synovitis was noted.  CDAI Exam: CDAI Score: -- Patient Global: --; Provider Global: -- Swollen: --; Tender: -- Joint Exam 12/21/2019   No joint exam has been documented for this visit   There is currently no information documented on the homunculus. Go to the Rheumatology activity and complete the homunculus joint exam.  Investigation: No additional findings.  Imaging: XR Foot 2 Views Left  Result Date: 12/21/2019 Left first MTP narrowing and subluxation was noted.  PIP and DIP narrowing was noted.  MTP narrowing was noted.  Erosive changes noted in the fifth MTP joint.  No intertarsal or tibiotalar joint space narrowing was noted.  No radiographic progression was noted when compared to the films of 2018. Impression: These findings are consistent with rheumatoid arthritis and osteoarthritis overlap.  XR Foot 2 Views Right  Result Date: 12/21/2019 Juxta-articular osteopenia was noted.  First MTP narrowing and subluxation was noted.  PIP and DIP narrowing was noted.  Narrowing  of most MTP joints was noted.  Erosive changes were noted in the first and fifth MTP joints.  No intertarsal or tibiotalar joint space narrowing was noted.  No radiographic progression was noted when compared to the films of 2018. Impression: These findings are consistent with rheumatoid arthritis and osteoarthritis overlap.  XR Hand 2 View Left  Result Date: 12/21/2019 Juxta-articular osteopenia was noted.  Severe CMC narrowing and subluxation was noted.  PIP and DIP narrowing was noted.  Narrowing of MCP joints was noted.  Cystic changes in the carpal bones were noted.  No radiographic progression was noted when compared to the x-rays of 2018. Impression: These findings are consistent with rheumatoid arthritis and osteoarthritis overlap.  XR Hand 2 View Right  Result Date: 12/21/2019 Juxta-articular osteopenia was noted.  Severe CMC narrowing and subluxation was noted.  PIP and DIP narrowing was noted.  Narrowing of all MCP joints was noted.  No significant intercarpal or radiocarpal joint space narrowing was noted.  No erosive changes were noted.  No radiographic progression was noted when compared to the x-rays of April 09, 2017. Impression: These findings are consistent with rheumatoid arthritis and osteoarthritis overlap.   Recent Labs: Lab Results  Component Value Date   WBC 9.0 10/18/2019   HGB 11.6 (L) 10/18/2019   PLT 378 10/18/2019   NA 130 (L) 11/18/2019   K 4.7 11/18/2019   CL 100 11/18/2019   CO2 25 11/18/2019   GLUCOSE 97 11/18/2019   BUN 16 11/18/2019   CREATININE 0.73 11/18/2019   BILITOT 0.3 11/18/2019   ALKPHOS 74 11/18/2019   AST 19 11/18/2019   ALT 17 11/18/2019   PROT 6.9 11/18/2019   ALBUMIN 4.1 11/18/2019   CALCIUM 9.3 11/18/2019   GFRAA 84 10/18/2019   QFTBGOLDPLUS NEGATIVE 12/03/2019    Speciality Comments: TB gold neg 11/07/17  Procedures:  No procedures performed Allergies: Sulfonamide derivatives, Amlodipine, Zostavax [zoster vaccine live], and  Norvasc [amlodipine besylate]   Assessment / Plan:     Visit Diagnoses: Rheumatoid arthritis involving multiple sites with positive rheumatoid factor ([place]) - +RF, +ANA -she has pain and discomfort in her bilateral hands and her feet off and on.  No synovitis was noted on the examination today.  Plan: XR Hand 2 View Right, XR Hand 2 View Left, XR Foot 2 Views Right, XR Foot 2  Views Left.  We obtained x-rays of bilateral hands and her feet which showed rheumatoid arthritis and osteoarthritis overlap.  She has several changes in her feet.  There is no radiographic progression when compared to the films of 2018.  High risk medication use - Orencia 125 mg every 7 days, methotrexate 0.8 mL every 7 days, and folic acid 1 mg 2 tablets daily.  She has been tolerating medications well.  Her labs have been stable.  Next labs will be due in August.  Primary osteoarthritis of both hands-joint protection was discussed.  Primary osteoarthritis of both knees - bilateral monovisc injections on 07/19/19.  Trochanteric bursitis, left hip-improved since she has been using new mattress.  DDD (degenerative disc disease), cervical-she has some limitation with range of motion without discomfort.  DDD (degenerative disc disease), lumbar-she has discomfort off and on.  Age-related osteoporosis without current pathological fracture -  DEXA 07/26/2019 right 1/3 distal radius BMD 0.54 with T score -2.5.  -3% change in BMD for right 1/3 radius.  She had a -10% change in BMD for LFN. Reclast IV  Neuropathy  SIADH (syndrome of inappropriate ADH production) (HCC)-followed by endocrinology.  Low sodium levels  History of sleep apnea  History of hyperlipidemia  History of glaucoma  History of colonic polyps  History of gastroesophageal reflux (GERD)  Orders: Orders Placed This Encounter  Procedures  . XR Hand 2 View Right  . XR Hand 2 View Left  . XR Foot 2 Views Right  . XR Foot 2 Views Left   No orders of  the defined types were placed in this encounter.   Follow-Up Instructions: Return in about 5 months (around 05/22/2020) for Rheumatoid arthritis, Osteoarthritis.   Bo Merino, MD  Note - This record has been created using Editor, commissioning.  Chart creation errors have been sought, but may not always  have been located. Such creation errors do not reflect on  the standard of medical care.

## 2019-12-21 ENCOUNTER — Ambulatory Visit: Payer: Self-pay

## 2019-12-21 ENCOUNTER — Encounter: Payer: Self-pay | Admitting: Rheumatology

## 2019-12-21 ENCOUNTER — Other Ambulatory Visit: Payer: Self-pay

## 2019-12-21 ENCOUNTER — Ambulatory Visit: Payer: Medicare PPO | Admitting: Rheumatology

## 2019-12-21 VITALS — BP 146/69 | HR 56 | Resp 14 | Ht 65.0 in | Wt 134.2 lb

## 2019-12-21 DIAGNOSIS — M170 Bilateral primary osteoarthritis of knee: Secondary | ICD-10-CM

## 2019-12-21 DIAGNOSIS — M7062 Trochanteric bursitis, left hip: Secondary | ICD-10-CM | POA: Diagnosis not present

## 2019-12-21 DIAGNOSIS — Z8669 Personal history of other diseases of the nervous system and sense organs: Secondary | ICD-10-CM

## 2019-12-21 DIAGNOSIS — M79672 Pain in left foot: Secondary | ICD-10-CM | POA: Diagnosis not present

## 2019-12-21 DIAGNOSIS — Z8639 Personal history of other endocrine, nutritional and metabolic disease: Secondary | ICD-10-CM

## 2019-12-21 DIAGNOSIS — M0579 Rheumatoid arthritis with rheumatoid factor of multiple sites without organ or systems involvement: Secondary | ICD-10-CM

## 2019-12-21 DIAGNOSIS — M19042 Primary osteoarthritis, left hand: Secondary | ICD-10-CM

## 2019-12-21 DIAGNOSIS — M5030 Other cervical disc degeneration, unspecified cervical region: Secondary | ICD-10-CM | POA: Diagnosis not present

## 2019-12-21 DIAGNOSIS — Z86010 Personal history of colonic polyps: Secondary | ICD-10-CM

## 2019-12-21 DIAGNOSIS — M5136 Other intervertebral disc degeneration, lumbar region: Secondary | ICD-10-CM | POA: Diagnosis not present

## 2019-12-21 DIAGNOSIS — M19041 Primary osteoarthritis, right hand: Secondary | ICD-10-CM | POA: Diagnosis not present

## 2019-12-21 DIAGNOSIS — E871 Hypo-osmolality and hyponatremia: Secondary | ICD-10-CM

## 2019-12-21 DIAGNOSIS — G629 Polyneuropathy, unspecified: Secondary | ICD-10-CM

## 2019-12-21 DIAGNOSIS — M79671 Pain in right foot: Secondary | ICD-10-CM | POA: Diagnosis not present

## 2019-12-21 DIAGNOSIS — M810 Age-related osteoporosis without current pathological fracture: Secondary | ICD-10-CM | POA: Diagnosis not present

## 2019-12-21 DIAGNOSIS — Z79899 Other long term (current) drug therapy: Secondary | ICD-10-CM

## 2019-12-21 DIAGNOSIS — E222 Syndrome of inappropriate secretion of antidiuretic hormone: Secondary | ICD-10-CM

## 2019-12-21 DIAGNOSIS — Z8719 Personal history of other diseases of the digestive system: Secondary | ICD-10-CM

## 2019-12-21 NOTE — Patient Instructions (Signed)
Standing Labs We placed an order today for your standing lab work.   Please have your standing labs drawn in August and every 3 months   If possible, please have your labs drawn 2 weeks prior to your appointment so that the provider can discuss your results at your appointment.  We have open lab daily Monday through Thursday from 8:30-12:30 PM and 1:30-4:30 PM and Friday from 8:30-12:30 PM and 1:30-4:00 PM at the office of Dr. Raneisha Bress, [facility].   Please be advised, patients with office appointments requiring lab work will take precedents over walk-in lab work.  If possible, please come for your lab work on Monday and Friday afternoons, as you may experience shorter wait times. The office is located at [address], [address], [place], [place] [address] No appointment is necessary.   Labs are drawn by [facility]. Please bring your co-pay at the time of your lab draw.  You may receive a bill from [facility] for your lab work.  If you wish to have your labs drawn at another location, please call the office 24 hours in advance to send orders.  If you have any questions regarding directions or hours of operation,  please call [phone].   As a reminder, please drink plenty of water prior to coming for your lab work. Thanks!   

## 2020-01-07 ENCOUNTER — Other Ambulatory Visit: Payer: Self-pay | Admitting: Physician Assistant

## 2020-01-12 ENCOUNTER — Other Ambulatory Visit: Payer: Self-pay | Admitting: Internal Medicine

## 2020-01-14 DIAGNOSIS — D692 Other nonthrombocytopenic purpura: Secondary | ICD-10-CM | POA: Diagnosis not present

## 2020-01-14 DIAGNOSIS — L570 Actinic keratosis: Secondary | ICD-10-CM | POA: Diagnosis not present

## 2020-01-14 DIAGNOSIS — L219 Seborrheic dermatitis, unspecified: Secondary | ICD-10-CM | POA: Diagnosis not present

## 2020-01-14 DIAGNOSIS — D485 Neoplasm of uncertain behavior of skin: Secondary | ICD-10-CM | POA: Diagnosis not present

## 2020-01-14 DIAGNOSIS — C4441 Basal cell carcinoma of skin of scalp and neck: Secondary | ICD-10-CM | POA: Diagnosis not present

## 2020-01-17 ENCOUNTER — Other Ambulatory Visit: Payer: Self-pay | Admitting: *Deleted

## 2020-01-17 DIAGNOSIS — Z79899 Other long term (current) drug therapy: Secondary | ICD-10-CM | POA: Diagnosis not present

## 2020-01-17 LAB — CBC WITH DIFFERENTIAL/PLATELET
Absolute Monocytes: 610 {cells}/uL (ref 200–950)
Basophils Absolute: 64 {cells}/uL (ref 0–200)
Basophils Relative: 0.7 %
Eosinophils Absolute: 100 {cells}/uL (ref 15–500)
Eosinophils Relative: 1.1 %
HCT: 35.3 % (ref 35.0–45.0)
Hemoglobin: 11.5 g/dL — ABNORMAL LOW (ref 11.7–15.5)
Lymphs Abs: 2284 {cells}/uL (ref 850–3900)
MCH: 31.3 pg (ref 27.0–33.0)
MCHC: 32.6 g/dL (ref 32.0–36.0)
MCV: 95.9 fL (ref 80.0–100.0)
MPV: 10.4 fL (ref 7.5–12.5)
Monocytes Relative: 6.7 %
Neutro Abs: 6042 {cells}/uL (ref 1500–7800)
Neutrophils Relative %: 66.4 %
Platelets: 332 Thousand/uL (ref 140–400)
RBC: 3.68 Million/uL — ABNORMAL LOW (ref 3.80–5.10)
RDW: 13.2 % (ref 11.0–15.0)
Total Lymphocyte: 25.1 %
WBC: 9.1 Thousand/uL (ref 3.8–10.8)

## 2020-01-17 LAB — COMPLETE METABOLIC PANEL WITHOUT GFR
AG Ratio: 1.6 (calc) (ref 1.0–2.5)
ALT: 13 U/L (ref 6–29)
AST: 19 U/L (ref 10–35)
Albumin: 3.9 g/dL (ref 3.6–5.1)
Alkaline phosphatase (APISO): 64 U/L (ref 37–153)
BUN: 14 mg/dL (ref 7–25)
CO2: 27 mmol/L (ref 20–32)
Calcium: 9.7 mg/dL (ref 8.6–10.4)
Chloride: 100 mmol/L (ref 98–110)
Creat: 0.84 mg/dL (ref 0.60–0.93)
GFR, Est African American: 77 mL/min/1.73m2 (ref >=60)
GFR, Est Non African American: 67 mL/min/1.73m2 (ref >=60)
Globulin: 2.4 g/dL (ref 1.9–3.7)
Glucose, Bld: 74 mg/dL (ref 65–99)
Potassium: 4.6 mmol/L (ref 3.5–5.3)
Sodium: 134 mmol/L — ABNORMAL LOW (ref 135–146)
Total Bilirubin: 0.4 mg/dL (ref 0.2–1.2)
Total Protein: 6.3 g/dL (ref 6.1–8.1)

## 2020-01-18 NOTE — Progress Notes (Signed)
Labs are stable.

## 2020-02-11 ENCOUNTER — Other Ambulatory Visit: Payer: Self-pay | Admitting: Rheumatology

## 2020-02-11 NOTE — Telephone Encounter (Signed)
Last Visit: 12/21/2019 Next Visit: 05/23/2020  Okay to refill per Dr. Estanislado Pandy

## 2020-02-14 ENCOUNTER — Telehealth: Payer: Self-pay | Admitting: Internal Medicine

## 2020-02-14 NOTE — Telephone Encounter (Signed)
Patient called would like to know when she is to stop the Nexium medication

## 2020-02-14 NOTE — Telephone Encounter (Signed)
Please just explained to her that it controls her heartburn and the studies that suggest possible problems are very poor science and only weak associations and not cause and effect.  I think she should continue it based upon my knowledge of her situation.

## 2020-02-14 NOTE — Telephone Encounter (Signed)
Patient informed and said great this puts her mind to ease. She will continue her Nexium.

## 2020-02-14 NOTE — Telephone Encounter (Signed)
I spoke with Felicia [name] and she is concerned about being on the Nexium for a long time.  Someone from her [employer] and also a pharmacist told her she needed to check with you. Her GERD has been a little worse lately but she thinks its diet related.  She is aware Dr Carlean Purl is out and that I will be back in touch with her.

## 2020-02-18 ENCOUNTER — Other Ambulatory Visit: Payer: Self-pay

## 2020-02-18 ENCOUNTER — Ambulatory Visit: Payer: Medicare PPO | Admitting: Internal Medicine

## 2020-02-18 VITALS — BP 150/80 | HR 70 | Ht 65.0 in | Wt 134.8 lb

## 2020-02-18 DIAGNOSIS — E222 Syndrome of inappropriate secretion of antidiuretic hormone: Secondary | ICD-10-CM

## 2020-02-18 NOTE — Progress Notes (Signed)
Name: Felicia [name]  MRN/ DOB: [id number], 08/17/1940    Age/ Sex: 79 y.o., female     PCP: Kathyrn Lass, MD   Reason for Endocrinology Evaluation: Hyponatremia     Initial Endocrinology Clinic Visit: 11/18/2019    PATIENT IDENTIFIER: Ms. SHAKENNA [name] is a 79 y.o., female with a past medical history of HTN, RA and dyslipidemia. She has followed with [facility] since 11/18/2019 for consultative assistance with management of her hyponatremia    HISTORICAL SUMMARY:  Pt has been noted with hyponatremia since 2013. This has been fluctuating over the years.  She has not been on SSRI or SNRI nor diuretics TFT's have been normal  Cosyntropin stimulation test normal 11/2019  SUBJECTIVE:    Today (02/18/2020):  Ms. [name] is here for hyponatremia.    Has been having issues with left  Denies dizziness Has polydipsia   Has been having issues limiting fluid intake. Currently at 56 ounces a water.  She believes      HISTORY:  Past Medical History:  Past Medical History:  Diagnosis Date  . Adenomatous colon polyp 2004   Colo in 2009 "no polyps"  . Complication of anesthesia    small airway per patient  . GERD (gastroesophageal reflux disease)   . Glaucoma   . Hiatal hernia   . Hypertension   . Neuropathy   . Rheumatoid arthritis(714.0)    Orencia; Methotrexate (Dr. Patrecia Pour)  . Sleep apnea    on CPAP    Past Surgical History:  Past Surgical History:  Procedure Laterality Date  . CARDIAC CATHETERIZATION N/A 03/29/2015   Procedure: Left Heart Cath and Coronary Angiography;  Surgeon: Belva Crome, MD;  Location: [facility];  Service: Cardiovascular;  Laterality: N/A;  . CATARACT EXTRACTION, BILATERAL    . COLONOSCOPY W/ POLYPECTOMY     X1; negative subsequently  . DILATION AND CURETTAGE OF UTERUS    . EYE SURGERY     due to RA  . EYE SURGERY Left 06/2017   eyelid tendon   . HYSTEROSCOPY WITH D & C  06/24/2012   Procedure: DILATATION AND  CURETTAGE /HYSTEROSCOPY;  Surgeon: [name], MD;  Location: [facility];  Service: Gynecology;  Laterality: N/A;  . SHOULDER SURGERY Left    due to RA  . TRABECULECTOMY     X 2 OD; X 1 OS  . UPPER GASTROINTESTINAL ENDOSCOPY     hiatal hernia     Social History:  reports that she quit smoking about 48 years ago. Her smoking use included cigarettes. She has a 2.50 pack-year smoking history. She has never used smokeless tobacco. She reports that she does not drink alcohol and does not use drugs. Family History:  Family History  Problem Relation Age of Onset  . Dementia Mother   . Lung disease Mother        bronchiectasis  . Heart disease Mother        Aortic Stenosis  . Osteoporosis Mother   . Heart attack Father 56       S/P CBAG  . Stroke Maternal Grandmother 83  . Ulcerative colitis Brother   . Diabetes Neg Hx   . Cancer Neg Hx       HOME MEDICATIONS: Allergies as of 02/18/2020      Reactions   Sulfonamide Derivatives    Rash, redness   Amlodipine Swelling   Zostavax [zoster Vaccine Live]    Unable to get due to RA,  per patient    Norvasc [amlodipine Besylate]    edema      Medication List       Accurate as of February 18, 2020  7:18 AM. If you have any questions, ask your nurse or doctor.        BENEFIBER DRINK MIX PO Take at bedtime by mouth.   Caltrate 600+D 600-400 MG-UNIT tablet Generic drug: Calcium Carbonate-Vitamin D Take 2 tablets by mouth daily.   esomeprazole 40 MG capsule Commonly known as: NEXIUM TAKE ONE CAPSULE BY MOUTH TWICE DAILY BEFORE A MEAL   estradiol 1 MG tablet Commonly known as: ESTRACE Take 1 mg by mouth daily.   folic acid 1 MG tablet Commonly known as: FOLVITE Take 2 tablets (2 mg total) by mouth daily.   gabapentin 100 MG capsule Commonly known as: NEURONTIN Take 1 capsule (100 mg total) by mouth 3 (three) times daily.   Krill Oil Omega-3 300 MG Caps Take 1 capsule by mouth daily.   latanoprost 0.005 %  ophthalmic solution Commonly known as: XALATAN Place 1 drop at bedtime into the right eye.   losartan 100 MG tablet Commonly known as: COZAAR Take 100 mg by mouth daily.   methotrexate 50 MG/2ML injection ADMINISTER 0.8 ML(20 MG) UNDER THE SKIN 1 TIME A WEEK   metoprolol tartrate 50 MG tablet Commonly known as: LOPRESSOR Take 50 mg by mouth daily.   metroNIDAZOLE 0.75 % gel Commonly known as: METROGEL Apply 1 application topically 2 (two) times daily.   MULTIPLE VITAMIN PO Take by mouth.   Orencia 125 MG/ML [place] Generic drug: Abatacept INJECT 125MG  SUBCUTANEOUSLY WEEKLY   pravastatin 20 MG tablet Commonly known as: PRAVACHOL Take 1 tablet (20 mg total) by mouth every evening.   PREVAGEN PO Take by mouth daily.   progesterone 200 MG capsule Commonly known as: PROMETRIUM Take 200 mg by mouth daily.   REFRESH OP Apply 1 drop to eye daily as needed (FOR DRY EYES).   timolol 0.5 % ophthalmic solution Commonly known as: TIMOPTIC Place 1 drop into both eyes daily.   TUBERCULIN SYR 1CC/27GX1/2" 27G X 1/2" 1 ML Misc Commonly known as: B-D TB SYRINGE 1CC/27GX1/2" USE WITH METHOTREXATE   valACYclovir 1000 MG tablet Commonly known as: VALTREX Take 1,000 mg by mouth as needed (fever blister). Reported on 06/14/2015   Vitamin B-12 2500 MCG Subl Place under the tongue.   Vitamin D3 25 MCG (1000 UT) Caps Take 1 capsule by mouth daily.   Voltaren 1 % Gel Generic drug: diclofenac Sodium Voltaren 1 % topical gel  apply 3 grams to LARGE JOINTS UP TO THREE TIMES A DAY         OBJECTIVE:   PHYSICAL EXAM: VS: BP (!) 150/80 (BP Location: Left Arm, Patient Position: Sitting, Cuff Size: Normal)   Pulse 70   Ht 5\' 5"  (1.651 m)   Wt 134 lb 12.8 oz (61.1 kg)   SpO2 98%   BMI 22.43 kg/m    EXAM: General: Pt appears well and is in NAD  Neck: General: Supple without adenopathy. Thyroid: Thyroid size normal.  No goiter or nodules appreciated. No thyroid bruit.    Lungs: Clear with good BS bilat with no rales, rhonchi, or wheezes  Heart: Auscultation: RRR.  Abdomen: Normoactive bowel sounds, soft, nontender, without masses or organomegaly palpable  Extremities:  BL LE: No pretibial edema normal ROM and strength.  Skin: Hair: Texture and amount normal with gender appropriate distribution Skin Inspection: No rashes Skin  Palpation: Skin temperature, texture, and thickness normal to palpation  Mental Status: Judgment, insight: Intact Memory: Intact for recent and remote events Mood and affect: No depression, anxiety, or agitation     DATA REVIEWED: Results for Ocampo, Maire E "Lizann Vermeer" (MRN [id number]) as of 02/20/2020 14:08  Ref. Range 01/17/2020 13:35  Sodium Latest Ref Range: 135 - 146 mmol/L 134 (L)  Potassium Latest Ref Range: 3.5 - 5.3 mmol/L 4.6  Chloride Latest Ref Range: 98 - 110 mmol/L 100  CO2 Latest Ref Range: 20 - 32 mmol/L 27  Glucose Latest Ref Range: 65 - 99 mg/dL 74  BUN Latest Ref Range: 7 - 25 mg/dL 14  Creatinine Latest Ref Range: 0.60 - 0.93 mg/dL 0.84  Calcium Latest Ref Range: 8.6 - 10.4 mg/dL 9.7  BUN/Creatinine Ratio Latest Ref Range: 6 - 22 (calc) NOT APPLICABLE  AG Ratio Latest Ref Range: 1.0 - 2.5 (calc) 1.6  AST Latest Ref Range: 10 - 35 U/L 19  ALT Latest Ref Range: 6 - 29 U/L 13  Total Protein Latest Ref Range: 6.1 - 8.1 g/dL 6.3  Total Bilirubin Latest Ref Range: 0.2 - 1.2 mg/dL 0.4  GFR, Est Non African American Latest Ref Range: > OR = 60 mL/min/1.28m2 67  GFR, Est African American Latest Ref Range: > OR = 60 mL/min/1.33m2 77  Alkaline phosphatase (APISO) Latest Ref Range: 37 - 153 U/L 64  Globulin Latest Ref Range: 1.9 - 3.7 g/dL (calc) 2.4     ASSESSMENT / PLAN / RECOMMENDATIONS:   1. SIADH:   - She is having a hard time limiting liquids to 34 ounce daily but able to keep fluids between 45-55 ounce daily. - Last serum sodium is 134 mEq/L. - Goal of serum sodium 130 mEq/L or better.  - We discussed  different therapies such as salt tablets and demeclocycline , we discussed benefits and risk of all.  - TFT's and cosyntropin stimulation test have been normal    Medications  Restrict fluid to 45-55 ounce daily   F/U in 6 months     Signed electronically by: Mack Guise, MD  [facility]  [facility] [address] [place], [place] [address] Phone: [phone] FAX: [phone]      CC: Kathyrn Lass, [place] [address] Phone: [phone]  Fax: [phone]   Return to Endocrinology clinic as below: Future Appointments  Date Time Provider [place]  02/18/2020 10:10 AM [place] Devine, Melanie Crazier, MD [facility] None  05/23/2020  9:30 AM Bo Merino, MD [facility] None  06/28/2020 11:00 AM Debbora Presto, NP [facility] None

## 2020-02-18 NOTE — Patient Instructions (Addendum)
-   Your sodium is improving , please continue to limit fluid intake to less then 60 ounce daily   - The goal for sodium is 130 or better.

## 2020-03-01 ENCOUNTER — Telehealth: Payer: Self-pay

## 2020-03-01 ENCOUNTER — Other Ambulatory Visit: Payer: Self-pay | Admitting: Physician Assistant

## 2020-03-01 NOTE — Telephone Encounter (Signed)
Last Visit: 12/21/2019 Next Visit: 05/23/2020 Labs: 01/17/2020 Labs are stable.   Current Dose per office note on 12/21/2019: methotrexate 0.8 mL every 7 days Dx: Rheumatoid arthritis involving multiple sites with positive rheumatoid factor   Okay to refill per Dr. Estanislado Pandy

## 2020-03-01 NOTE — Telephone Encounter (Signed)
Patient advised her prescription has been sent to the pharmacy. Patient advised we send it to the pharmacy as a 90 day supply. Patient advised she will need to speak with pharmacy if they are splitting up the prescription. Patient asked about using the vial for more than one dose. Patient advised since she should be receiving the MTX with preservatives she may use the vial more than once. Patient expressed understanding.

## 2020-03-01 NOTE — Telephone Encounter (Signed)
Patient called requesting prescription refill of Methotrexate to be sent to [facility].  Patient states [facility] requested that Dr. Estanislado Pandy note on the prescription to "keep vials together."

## 2020-03-09 DIAGNOSIS — Z86007 Personal history of in-situ neoplasm of skin: Secondary | ICD-10-CM | POA: Diagnosis not present

## 2020-03-09 DIAGNOSIS — L570 Actinic keratosis: Secondary | ICD-10-CM | POA: Diagnosis not present

## 2020-03-09 DIAGNOSIS — L820 Inflamed seborrheic keratosis: Secondary | ICD-10-CM | POA: Diagnosis not present

## 2020-03-12 ENCOUNTER — Other Ambulatory Visit: Payer: Self-pay | Admitting: Rheumatology

## 2020-03-13 ENCOUNTER — Encounter: Payer: Self-pay | Admitting: Rheumatology

## 2020-03-13 ENCOUNTER — Other Ambulatory Visit: Payer: Self-pay

## 2020-03-13 ENCOUNTER — Ambulatory Visit: Payer: Medicare PPO | Admitting: Rheumatology

## 2020-03-13 VITALS — BP 152/86 | HR 78 | Resp 14 | Ht 65.0 in | Wt 132.0 lb

## 2020-03-13 DIAGNOSIS — Z8719 Personal history of other diseases of the digestive system: Secondary | ICD-10-CM

## 2020-03-13 DIAGNOSIS — M810 Age-related osteoporosis without current pathological fracture: Secondary | ICD-10-CM | POA: Diagnosis not present

## 2020-03-13 DIAGNOSIS — G629 Polyneuropathy, unspecified: Secondary | ICD-10-CM

## 2020-03-13 DIAGNOSIS — M0579 Rheumatoid arthritis with rheumatoid factor of multiple sites without organ or systems involvement: Secondary | ICD-10-CM

## 2020-03-13 DIAGNOSIS — Z79899 Other long term (current) drug therapy: Secondary | ICD-10-CM

## 2020-03-13 DIAGNOSIS — M170 Bilateral primary osteoarthritis of knee: Secondary | ICD-10-CM | POA: Diagnosis not present

## 2020-03-13 DIAGNOSIS — Z86010 Personal history of colonic polyps: Secondary | ICD-10-CM

## 2020-03-13 DIAGNOSIS — M5136 Other intervertebral disc degeneration, lumbar region: Secondary | ICD-10-CM | POA: Diagnosis not present

## 2020-03-13 DIAGNOSIS — M19041 Primary osteoarthritis, right hand: Secondary | ICD-10-CM

## 2020-03-13 DIAGNOSIS — M5030 Other cervical disc degeneration, unspecified cervical region: Secondary | ICD-10-CM | POA: Diagnosis not present

## 2020-03-13 DIAGNOSIS — E871 Hypo-osmolality and hyponatremia: Secondary | ICD-10-CM

## 2020-03-13 DIAGNOSIS — M19042 Primary osteoarthritis, left hand: Secondary | ICD-10-CM

## 2020-03-13 DIAGNOSIS — M7062 Trochanteric bursitis, left hip: Secondary | ICD-10-CM | POA: Diagnosis not present

## 2020-03-13 DIAGNOSIS — Z7189 Other specified counseling: Secondary | ICD-10-CM

## 2020-03-13 DIAGNOSIS — E222 Syndrome of inappropriate secretion of antidiuretic hormone: Secondary | ICD-10-CM

## 2020-03-13 DIAGNOSIS — Z8639 Personal history of other endocrine, nutritional and metabolic disease: Secondary | ICD-10-CM

## 2020-03-13 DIAGNOSIS — Z8669 Personal history of other diseases of the nervous system and sense organs: Secondary | ICD-10-CM

## 2020-03-13 NOTE — Progress Notes (Signed)
Office Visit Note  Patient: Felicia [name]             Date of Birth: [birth year]           MRN: [id number]             PCP: Kathyrn Lass, MD Referring: Kathyrn Lass, MD Visit Date: 03/13/2020 Occupation: @GUAROCC @  Subjective:  Other (left hip pain- request injection )   History of Present Illness: Felicia [name] is a 79 y.o. female with seropositive rheumatoid arthritis, osteoarthritis and degenerative disc disease.  She states she had been doing some work in her garden and cutting shrubs.  She had some pain and swelling in her left second MCP joint few days back which subsided.  She continues to have some discomfort in her knee joints.  She states recently for the last 4 weeks her left trochanteric bursa has been flaring and causing a lot of discomfort.  She also continues to have some discomfort in her lower back.  She has been taking her medications on a regular basis.  She is fully vaccinated against COVID-19.  Activities of Daily Living:  Patient reports morning stiffness for 30  minutes.   Patient Reports nocturnal pain.  Difficulty dressing/grooming: Denies Difficulty climbing stairs: Reports Difficulty getting out of chair: Reports Difficulty using hands for taps, buttons, cutlery, and/or writing: Denies  Review of Systems  Constitutional: Positive for fatigue.  HENT: Positive for mouth dryness. Negative for mouth sores and nose dryness.   Eyes: Positive for dryness. Negative for pain, itching and visual disturbance.  Respiratory: Negative for shortness of breath and difficulty breathing.   Cardiovascular: Negative for chest pain and palpitations.  Gastrointestinal: Positive for constipation and diarrhea. Negative for blood in stool.  Endocrine: Negative for increased urination.  Genitourinary: Negative for difficulty urinating and painful urination.  Musculoskeletal: Positive for arthralgias, joint pain, myalgias, morning stiffness, muscle tenderness and myalgias. Negative  for joint swelling.  Skin: Positive for color change. Negative for rash and redness.  Allergic/Immunologic: Negative for susceptible to infections.  Neurological: Positive for numbness. Negative for dizziness, headaches, memory loss and weakness.  Hematological: Positive for bruising/bleeding tendency.  Psychiatric/Behavioral: Positive for sleep disturbance. Negative for confusion.    PMFS History:  Patient Active Problem List   Diagnosis Date Noted  . SIADH (syndrome of inappropriate ADH production) ([place]) 02/18/2020  . Hyponatremia 11/18/2019  . Thrombocytosis 08/01/2018  . URI (upper respiratory infection) 04/14/2017  . Primary osteoarthritis of both knees 03/06/2017  . Exertional chest pain   . Pain in the chest 03/27/2015  . Postural hypotension 05/26/2013  . OSA (obstructive sleep apnea) 03/22/2013  . Osteoarthritis 11/07/2011  . Abdominal bruit 10/16/2011  . HTN (hypertension) 08/13/2011  . Anisocoria 08/13/2011  . Cervical radiculopathy 07/17/2011  . DDD (degenerative disc disease), cervical 07/10/2011  . Glaucoma 06/06/2011  . HIATAL HERNIA 01/30/2010  . POSTMENOPAUSAL SYNDROME 12/14/2009  . EUSTACHIAN TUBE DYSFUNCTION, RIGHT 06/28/2009  . GERD 12/07/2008  . Rheumatoid arthritis ([place]) 12/07/2008  . COLONIC POLYPS, HX OF 12/07/2008  . Hyperlipidemia LDL goal <70 10/17/2006  . HYPERCALCEMIA 10/17/2006  . Mitral valve disease 10/17/2006    Past Medical History:  Diagnosis Date  . Adenomatous colon polyp 2004   Colo in 2009 "no polyps"  . Complication of anesthesia    small airway per patient  . GERD (gastroesophageal reflux disease)   . Glaucoma   . Hiatal hernia   . Hypertension   . Neuropathy   .  Rheumatoid arthritis(714.0)    Orencia; Methotrexate (Dr. Patrecia Pour)  . Sleep apnea    on CPAP    Family History  Problem Relation Age of Onset  . Dementia Mother   . Lung disease Mother        bronchiectasis  . Heart disease Mother        Aortic Stenosis  .  Osteoporosis Mother   . Heart attack Father 53       S/P CBAG  . Stroke Maternal Grandmother 83  . Ulcerative colitis Brother   . Diabetes Neg Hx   . Cancer Neg Hx    Past Surgical History:  Procedure Laterality Date  . CARDIAC CATHETERIZATION N/A 03/29/2015   Procedure: Left Heart Cath and Coronary Angiography;  Surgeon: Belva Crome, MD;  Location: [facility];  Service: Cardiovascular;  Laterality: N/A;  . CATARACT EXTRACTION, BILATERAL    . COLONOSCOPY W/ POLYPECTOMY     X1; negative subsequently  . DILATION AND CURETTAGE OF UTERUS    . EYE SURGERY     due to RA  . EYE SURGERY Left 06/2017   eyelid tendon   . HYSTEROSCOPY WITH D & C  06/24/2012   Procedure: DILATATION AND CURETTAGE /HYSTEROSCOPY;  Surgeon: [name], MD;  Location: [facility];  Service: Gynecology;  Laterality: N/A;  . SHOULDER SURGERY Left    due to RA  . TRABECULECTOMY     X 2 OD; X 1 OS  . 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 Influenza Split 03/08/2014  . Influenza Whole 04/07/2007, 02/24/2008, 03/10/2009, 03/03/2012  . Influenza, High Dose Seasonal PF 01/15/2016, 03/07/2017  . Influenza,inj,Quad PF,6+ Mos 03/15/2013, 02/21/2015, 03/17/2018  . PFIZER SARS-COV-2 Vaccination 06/24/2019, 07/13/2019, 01/15/2020  . PPD Test 03/10/2012  . Pneumococcal Conjugate-13 09/12/2015  . Pneumococcal Polysaccharide-23 03/25/2003, 04/12/2008  . Td 12/14/2009  . Zoster Recombinat (Shingrix) 03/23/2018     Objective: Vital Signs: BP (!) 152/86 (BP Location: Left Arm, Patient Position: Sitting, Cuff Size: Normal)   Pulse 78   Resp 14   Ht 5\' 5"  (1.651 m)   Wt 132 lb (59.9 kg)   BMI 21.97 kg/m    Physical Exam Vitals and nursing note reviewed.  Constitutional:      Appearance: She is well-developed.    HENT:     Head: Normocephalic and atraumatic.  Eyes:     Conjunctiva/sclera: Conjunctivae normal.  Cardiovascular:     Rate and Rhythm: Normal rate and regular rhythm.     Heart sounds: Normal heart sounds.  Pulmonary:     Effort: Pulmonary effort is normal.     Breath sounds: Normal breath sounds.  Abdominal:     General: Bowel sounds are normal.     Palpations: Abdomen is soft.  Musculoskeletal:     Cervical back: Normal range of motion.  Lymphadenopathy:     Cervical: No cervical adenopathy.  Skin:    General: Skin is warm and dry.     Capillary Refill: Capillary refill takes less than 2 seconds.  Neurological:     Mental Status: She is alert and oriented to person, place, and time.  Psychiatric:        Behavior: Behavior normal.      Musculoskeletal Exam: C-spine was in good  range of motion.  Shoulder joints and elbow joints with good range of motion.  Wrist joints with good range of motion.  She had mild synovitis over MCPs and PIPs as described below.  She had good range of motion of her hip joints, knee joints, ankles.  There was no tenderness over ankle joints or MTPs.  She had tenderness on palpation over left trochanteric bursa.  CDAI Exam: CDAI Score: 4.9  Patient Global: 5 mm; Provider Global: 4 mm Swollen: 1 ; Tender: 3  Joint Exam 03/13/2020      Right  Left  MCP 3   Tender   Tender  PIP 2     Swollen Tender     Investigation: No additional findings.  Imaging: No results found.  Recent Labs: Lab Results  Component Value Date   WBC 9.1 01/17/2020   HGB 11.5 (L) 01/17/2020   PLT 332 01/17/2020   NA 134 (L) 01/17/2020   K 4.6 01/17/2020   CL 100 01/17/2020   CO2 27 01/17/2020   GLUCOSE 74 01/17/2020   BUN 14 01/17/2020   CREATININE 0.84 01/17/2020   BILITOT 0.4 01/17/2020   ALKPHOS 74 11/18/2019   AST 19 01/17/2020   ALT 13 01/17/2020   PROT 6.3 01/17/2020   ALBUMIN 4.1 11/18/2019   CALCIUM 9.7 01/17/2020   GFRAA 77 01/17/2020    QFTBGOLDPLUS NEGATIVE 12/03/2019    Speciality Comments: TB gold neg 11/07/17  Procedures:  No procedures performed Allergies: Sulfonamide derivatives, Amlodipine, Zostavax [zoster vaccine live], and Norvasc [amlodipine besylate]   Assessment / Plan:     Visit Diagnoses: Rheumatoid arthritis involving multiple sites with positive rheumatoid factor (HCC) -  +RF, +ANA.  She continues to have pain and discomfort in her bilateral hands.  She states she has been doing a lot of gardening and pruning which has flared some of her joints.  High risk medication use - Orencia 125 mg every 7 days, methotrexate 0.8 mL every 7 days, and folic acid 1 mg 2 tablets daily.  Her labs are stable.  She will need labs in November and then every 3 months to monitor for drug toxicity.  TB gold was negative in July 2021.  Trochanteric bursitis, left hip-she has been having severe pain and discomfort over the left trochanteric bursa.  She believes is due to prolonged standing in the yard.  After different treatment options were discussed left trochanteric bursa was injected with cortisone which she tolerated well.  We will see response to it.  I have also given her a handout on IT band exercises.  Primary osteoarthritis of both hands-she has such rheumatoid arthritis and osteoarthritis overlap.  She had some increased pain and discomfort in her hands recently.  Synovial thickening was noted over bilateral third MCP joint.  Synovitis was noted in left second PIP joint.  Primary osteoarthritis of both knees - bilateral monovisc injections on 07/19/19.  She was complaining of some discomfort in her knee joints but no synovitis was noted.  DDD (degenerative disc disease), cervical-she had fairly good range of motion of her cervical spine.  DDD (degenerative disc disease), lumbar-she continues to have some lower back pain.  Age-related osteoporosis without current pathological fracture - DEXA 07/26/2019 right 1/3 distal radius  BMD 0.54 with T score -2.5.  -3% change in BMD for right 1/3 radius.  She had a -10% change in BMD for LFN. Reclast IV infusion was on August 10, 2019.  Other medical problems are listed as follows:  Neuropathy  SIADH (syndrome of inappropriate ADH production) ([facility]) - followed by endocrinology.  Low sodium levels  History of colonic polyps  History of hyperlipidemia  History of sleep apnea  History of gastroesophageal reflux (GERD)  History of glaucoma  Educated about COVID-19 virus infection-patient is fully vaccinated against COVID-19.  She also received a booster.  Use of mask, social distancing and hand hygiene was discussed.  I also discussed the use of monoclonal antibodies in case she develops COVID-19 infection.  Orders: No orders of the defined types were placed in this encounter.  No orders of the defined types were placed in this encounter.    Follow-Up Instructions: Return for Rheumatoid arthritis.   Bo Merino, MD  Note - This record has been created using Editor, commissioning.  Chart creation errors have been sought, but may not always  have been located. Such creation errors do not reflect on  the standard of medical care.

## 2020-03-13 NOTE — Patient Instructions (Addendum)
Standing Labs We placed an order today for your standing lab work.   Please have your standing labs drawn in November and every 3 months  If possible, please have your labs drawn 2 weeks prior to your appointment so that the provider can discuss your results at your appointment.  We have open lab daily Monday through Thursday from 8:30-12:30 PM and 1:30-4:30 PM and Friday from 8:30-12:30 PM and 1:30-4:00 PM at the office of Dr. Bo Merino, [facility].   Please be advised, patients with office appointments requiring lab work will take precedents over walk-in lab work.  If possible, please come for your lab work on Monday and Friday afternoons, as you may experience shorter wait times. The office is located at [address] [place], [place], [place] [address] No appointment is necessary.   Labs are drawn by [facility]. Please bring your co-pay at the time of your lab draw.  You may receive a bill from [facility] for your lab work.  If you wish to have your labs drawn at another location, please call the office 24 hours in advance to send orders.  If you have any questions regarding directions or hours of operation,  please call [phone].   As a reminder, please drink plenty of water prior to coming for your lab work. Thanks!     Iliotibial Band Syndrome Rehab Ask your health care provider which exercises are safe for you. Do exercises exactly as told by your health care provider and adjust them as directed. It is normal to feel mild stretching, pulling, tightness, or discomfort as you do these exercises. Stop right away if you feel sudden pain or your pain gets significantly worse. Do not begin these exercises until told by your health care provider. Stretching and range-of-motion exercises These exercises warm up your muscles and joints and improve the movement and flexibility of your hip and pelvis. Quadriceps stretch, prone  1. Lie on your abdomen on a firm surface,  such as a bed or padded floor (prone position). 2. Bend your left / right knee and reach back to hold your ankle or pant leg. If you cannot reach your ankle or pant leg, loop a belt around your foot and grab the belt instead. 3. Gently pull your heel toward your buttocks. Your knee should not slide out to the side. You should feel a stretch in the front of your thigh and knee (quadriceps). 4. Hold this position for __________ seconds. Repeat __________ times. Complete this exercise __________ times a day. Iliotibial band stretch An iliotibial band is a strong band of muscle tissue that runs from the outer side of your hip to the outer side of your thigh and knee. 1. Lie on your side with your left / right leg in the top position. 2. Bend both of your knees and grab your left / right ankle. Stretch out your bottom arm to help you balance. 3. Slowly bring your top knee back so your thigh goes behind your trunk. 4. Slowly lower your top leg toward the floor until you feel a gentle stretch on the outside of your left / right hip and thigh. If you do not feel a stretch and your knee will not fall farther, place the heel of your other foot on top of your knee and pull your knee down toward the floor with your foot. 5. Hold this position for __________ seconds. Repeat __________ times. Complete this exercise __________ times a day. Strengthening exercises These  exercises build strength and endurance in your hip and pelvis. Endurance is the ability to use your muscles for a long time, even after they get tired. Straight leg raises, side-lying This exercise strengthens the muscles that rotate the leg at the hip and move it away from your body (hip abductors). 1. Lie on your side with your left / right leg in the top position. Lie so your head, shoulder, hip, and knee line up. You may bend your bottom knee to help you balance. 2. Roll your hips slightly forward so your hips are stacked directly over each  other and your left / right knee is facing forward. 3. Tense the muscles in your outer thigh and lift your top leg 4-6 inches (10-15 cm). 4. Hold this position for __________ seconds. 5. Slowly return to the starting position. Let your muscles relax completely before doing another repetition. Repeat __________ times. Complete this exercise __________ times a day. Leg raises, prone This exercise strengthens the muscles that move the hips (hip extensors). 1. Lie on your abdomen on your bed or a firm surface. You can put a pillow under your hips if that is more comfortable for your lower back. 2. Bend your left / right knee so your foot is straight up in the air. 3. Squeeze your buttocks muscles and lift your left / right thigh off the bed. Do not let your back arch. 4. Tense your thigh muscle as hard as you can without increasing any knee pain. 5. Hold this position for __________ seconds. 6. Slowly lower your leg to the starting position and allow it to relax completely. Repeat __________ times. Complete this exercise __________ times a day. Hip hike 1. Stand sideways on a bottom step. Stand on your left / right leg with your other foot unsupported next to the step. You can hold on to the railing or wall for balance if needed. 2. Keep your knees straight and your torso square. Then lift your left / right hip up toward the ceiling. 3. Slowly let your left / right hip lower toward the floor, past the starting position. Your foot should get closer to the floor. Do not lean or bend your knees. Repeat __________ times. Complete this exercise __________ times a day. This information is not intended to replace advice given to you by your health care provider. Make sure you discuss any questions you have with your health care provider. Document Revised: 09/10/2018 Document Reviewed: 03/11/2018 Elsevier Patient Education  [place].

## 2020-03-13 NOTE — Telephone Encounter (Signed)
Last Visit: 12/21/2019 Next Visit: 05/23/2020  Okay to refill per Dr. Estanislado Pandy

## 2020-03-16 DIAGNOSIS — C4442 Squamous cell carcinoma of skin of scalp and neck: Secondary | ICD-10-CM | POA: Diagnosis not present

## 2020-03-17 ENCOUNTER — Ambulatory Visit: Payer: Medicare PPO | Admitting: Rheumatology

## 2020-03-20 DIAGNOSIS — H409 Unspecified glaucoma: Secondary | ICD-10-CM | POA: Diagnosis not present

## 2020-03-20 DIAGNOSIS — H401233 Low-tension glaucoma, bilateral, severe stage: Secondary | ICD-10-CM | POA: Diagnosis not present

## 2020-03-22 DIAGNOSIS — I25119 Atherosclerotic heart disease of native coronary artery with unspecified angina pectoris: Secondary | ICD-10-CM | POA: Diagnosis not present

## 2020-03-22 DIAGNOSIS — I10 Essential (primary) hypertension: Secondary | ICD-10-CM | POA: Diagnosis not present

## 2020-03-22 DIAGNOSIS — E222 Syndrome of inappropriate secretion of antidiuretic hormone: Secondary | ICD-10-CM | POA: Diagnosis not present

## 2020-03-22 DIAGNOSIS — R7303 Prediabetes: Secondary | ICD-10-CM | POA: Diagnosis not present

## 2020-03-22 DIAGNOSIS — Z6822 Body mass index (BMI) 22.0-22.9, adult: Secondary | ICD-10-CM | POA: Diagnosis not present

## 2020-03-22 DIAGNOSIS — Z0000 Encounter for general adult medical examination without abnormal findings: Secondary | ICD-10-CM | POA: Diagnosis not present

## 2020-03-22 DIAGNOSIS — M810 Age-related osteoporosis without current pathological fracture: Secondary | ICD-10-CM | POA: Diagnosis not present

## 2020-03-22 DIAGNOSIS — Z23 Encounter for immunization: Secondary | ICD-10-CM | POA: Diagnosis not present

## 2020-03-22 DIAGNOSIS — M069 Rheumatoid arthritis, unspecified: Secondary | ICD-10-CM | POA: Diagnosis not present

## 2020-04-07 ENCOUNTER — Other Ambulatory Visit: Payer: Self-pay | Admitting: Physician Assistant

## 2020-04-07 DIAGNOSIS — M0579 Rheumatoid arthritis with rheumatoid factor of multiple sites without organ or systems involvement: Secondary | ICD-10-CM

## 2020-04-07 NOTE — Telephone Encounter (Signed)
Last Visit: 03/13/2020 Next Visit: 05/23/2020 Labs: 01/17/2020 Labs are stable.  TB Gold: 12/03/2019 Neg   Current Dose per office note 03/13/2020: Orencia 125 mg every 7 days  [id number] arthritis involving multiple sites with positive rheumatoid factor   Okay to refill per Dr. Estanislado Pandy

## 2020-04-10 ENCOUNTER — Telehealth: Payer: Self-pay | Admitting: Diagnostic Neuroimaging

## 2020-04-10 ENCOUNTER — Other Ambulatory Visit: Payer: Self-pay | Admitting: *Deleted

## 2020-04-10 DIAGNOSIS — Z79899 Other long term (current) drug therapy: Secondary | ICD-10-CM | POA: Diagnosis not present

## 2020-04-10 NOTE — Telephone Encounter (Signed)
Pt called, having pain in legs; burning, spasms., and pain in  lower back. Would like call from the nurse.

## 2020-04-11 LAB — COMPLETE METABOLIC PANEL WITH GFR
AG Ratio: 1.5 (calc) (ref 1.0–2.5)
ALT: 12 U/L (ref 6–29)
AST: 16 U/L (ref 10–35)
Albumin: 3.9 g/dL (ref 3.6–5.1)
Alkaline phosphatase (APISO): 63 U/L (ref 37–153)
BUN: 15 mg/dL (ref 7–25)
CO2: 24 mmol/L (ref 20–32)
Calcium: 9.9 mg/dL (ref 8.6–10.4)
Chloride: 96 mmol/L — ABNORMAL LOW (ref 98–110)
Creat: 0.81 mg/dL (ref 0.60–0.93)
GFR, Est African American: 81 mL/min/{1.73_m2} (ref >=60)
GFR, Est Non African American: 70 mL/min/{1.73_m2} (ref >=60)
Globulin: 2.6 g/dL (calc) (ref 1.9–3.7)
Glucose, Bld: 91 mg/dL (ref 65–139)
Potassium: 5.2 mmol/L (ref 3.5–5.3)
Sodium: 130 mmol/L — ABNORMAL LOW (ref 135–146)
Total Bilirubin: 0.5 mg/dL (ref 0.2–1.2)
Total Protein: 6.5 g/dL (ref 6.1–8.1)

## 2020-04-11 LAB — CBC WITH DIFFERENTIAL/PLATELET
Absolute Monocytes: 765 cells/uL (ref 200–950)
Basophils Absolute: 71 cells/uL (ref 0–200)
Basophils Relative: 0.7 %
Eosinophils Absolute: 122 cells/uL (ref 15–500)
Eosinophils Relative: 1.2 %
HCT: 38.6 % (ref 35.0–45.0)
Hemoglobin: 12.7 g/dL (ref 11.7–15.5)
Lymphs Abs: 1897 cells/uL (ref 850–3900)
MCH: 31.2 pg (ref 27.0–33.0)
MCHC: 32.9 g/dL (ref 32.0–36.0)
MCV: 94.8 fL (ref 80.0–100.0)
MPV: 9.6 fL (ref 7.5–12.5)
Monocytes Relative: 7.5 %
Neutro Abs: 7344 cells/uL (ref 1500–7800)
Neutrophils Relative %: 72 %
Platelets: 431 10*3/uL — ABNORMAL HIGH (ref 140–400)
RBC: 4.07 10*6/uL (ref 3.80–5.10)
RDW: 13.7 % (ref 11.0–15.0)
Total Lymphocyte: 18.6 %
WBC: 10.2 10*3/uL (ref 3.8–10.8)

## 2020-04-11 NOTE — Telephone Encounter (Signed)
Spoke with patient who stated she is having problems at night with legs cramping, muscle spasms, they may wake her every hour. She uses ice and  walks to ease cramping. She takes gabapentin 100 mg in morning, 200 mg at night.  She's also having pain in her lower back. She is unsure if her issues are related to RA or neuropathy. She recently had  Annual wellness check up with labs that were basically okay. Her next FU with rheumatology is 05/23/20. I advised will discuss with Dr Leta Baptist and let her know of any recommendations. Patient verbalized understanding, appreciation.

## 2020-04-11 NOTE — Telephone Encounter (Signed)
Called patient and advised her of Dr Gladstone Lighter instructions. Advised she can begin by increasing night dose, but may tirtrate up to 300 mg twice daily as needed. I requested she let us if it is helpful so a new Rx can be sent in. She  verbalized understanding, appreciation.

## 2020-04-11 NOTE — Progress Notes (Signed)
Plt count is mildly elevated-431. Rest of CBC WNL.   Sodium and chloride are low and trending down.  Please forward lab work to PCP.  Rest of CMP WNL.

## 2020-04-11 NOTE — Telephone Encounter (Signed)
Could increase gabapentin to 300mg  twice a day to see if it helps; also recommend to follow up with rheumatology. -VRP

## 2020-04-17 ENCOUNTER — Telehealth: Payer: Self-pay | Admitting: Rheumatology

## 2020-04-17 NOTE — Telephone Encounter (Signed)
Patient calling to let you know a renewal is needed for patient's Orencia syringes. Please call [place]. Please call patient to advise.

## 2020-04-18 NOTE — Telephone Encounter (Signed)
Submitted a Prior Authorization request to [employer] for [facility] via Cover My Meds. Will update once we receive a response.   (Key: [id number]

## 2020-04-18 NOTE — Telephone Encounter (Signed)
Received notification from [facility] regarding a prior authorization for [facility]. Authorization has been APPROVED from 04/18/20 to 06/02/21.   Authorization # [id number]   Plan would not approve a 90 day supply, only 30 days is allowed for Specialty meds under plan. Called patient to advise.

## 2020-04-18 NOTE — Telephone Encounter (Signed)
FYI

## 2020-04-19 MED ORDER — GABAPENTIN 100 MG PO CAPS: ORAL_CAPSULE | ORAL | 4 refills | Status: DC

## 2020-04-19 NOTE — Telephone Encounter (Signed)
Pt. asks for call back from RN regarding previous conversation of pain & gabapentin.

## 2020-04-19 NOTE — Addendum Note (Signed)
Addended by: Florian Buff C on: 04/19/2020 12:07 PM   Modules accepted: Orders

## 2020-04-19 NOTE — Telephone Encounter (Addendum)
Called patient who stated she has slept through each night since increasing gabapentin to 300 mg nightly. She continues to take 100 mg in morning and wants to stay on that dose. I advised will send in new Rx to reflect new dosing. She  verbalized understanding, appreciation.

## 2020-05-09 NOTE — Progress Notes (Signed)
Office Visit Note  Patient: Felicia [name]             Date of Birth: February 28, 1941           MRN: [id number]             PCP: Kathyrn Lass, MD Referring: Kathyrn Lass, MD Visit Date: 05/23/2020 Occupation: @GUAROCC @  Subjective:  Other (Left hip pain )   History of Present Illness: Felicia [name] is a 79 y.o. female with history of seropositive rheumatoid arthritis.  She states in October she came for left trochanteric bursitis to see me.  At that time the trochanteric bursa injection was given which helped her for some time but then the pain recurred.  She was also seen by her neurologist for lower back pain and radiculopathy.  She states she was given gabapentin which helps her to sleep at night.  She states she has persistent pain in her lower back, left hip and her left knee.  She states the pain from the back radiates down into her hip.  She states her rheumatoid arthritis is fairly well controlled.  She had mild flares lasting for 1 or 2 days and then subsided.  Activities of Daily Living:  Patient reports morning stiffness for several  hours.   Patient Reports nocturnal pain.  Difficulty dressing/grooming: Denies Difficulty climbing stairs: Reports Difficulty getting out of chair: Reports Difficulty using hands for taps, buttons, cutlery, and/or writing: Denies  Review of Systems  Constitutional: Positive for fatigue.  HENT: Positive for mouth sores and mouth dryness. Negative for nose dryness.   Eyes: Positive for dryness. Negative for pain and itching.  Respiratory: Negative for shortness of breath and difficulty breathing.   Cardiovascular: Negative for chest pain and palpitations.  Gastrointestinal: Negative for blood in stool, constipation and diarrhea.  Endocrine: Negative for increased urination.  Genitourinary: Negative for difficulty urinating.  Musculoskeletal: Positive for arthralgias, joint pain, joint swelling, myalgias, morning stiffness, muscle tenderness and  myalgias.  Skin: Positive for redness. Negative for color change and rash.  Allergic/Immunologic: Negative for susceptible to infections.  Neurological: Positive for numbness and weakness. Negative for dizziness, headaches and memory loss.  Hematological: Positive for bruising/bleeding tendency.  Psychiatric/Behavioral: Negative for confusion.    PMFS History:  Patient Active Problem List   Diagnosis Date Noted  . SIADH (syndrome of inappropriate ADH production) ([place]) 02/18/2020  . Hyponatremia 11/18/2019  . Thrombocytosis 08/01/2018  . URI (upper respiratory infection) 04/14/2017  . Primary osteoarthritis of both knees 03/06/2017  . Exertional chest pain   . Pain in the chest 03/27/2015  . Postural hypotension 05/26/2013  . OSA (obstructive sleep apnea) 03/22/2013  . Osteoarthritis 11/07/2011  . Abdominal bruit 10/16/2011  . HTN (hypertension) 08/13/2011  . Anisocoria 08/13/2011  . Cervical radiculopathy 07/17/2011  . DDD (degenerative disc disease), cervical 07/10/2011  . Glaucoma 06/06/2011  . HIATAL HERNIA 01/30/2010  . POSTMENOPAUSAL SYNDROME 12/14/2009  . EUSTACHIAN TUBE DYSFUNCTION, RIGHT 06/28/2009  . GERD 12/07/2008  . Rheumatoid arthritis ([place]) 12/07/2008  . COLONIC POLYPS, HX OF 12/07/2008  . Hyperlipidemia LDL goal <70 10/17/2006  . HYPERCALCEMIA 10/17/2006  . Mitral valve disease 10/17/2006    Past Medical History:  Diagnosis Date  . Adenomatous colon polyp 2004   Colo in 2009 "no polyps"  . Complication of anesthesia    small airway per patient  . GERD (gastroesophageal reflux disease)   . Glaucoma   . Hiatal hernia   . Hypertension   .  Neuropathy   . Rheumatoid arthritis(714.0)    Orencia; Methotrexate (Dr. Patrecia Pour)  . Sleep apnea    on CPAP    Family History  Problem Relation Age of Onset  . Dementia Mother   . Lung disease Mother        bronchiectasis  . Heart disease Mother        Aortic Stenosis  . Osteoporosis Mother   . Heart attack  Father 68       S/P CBAG  . Stroke Maternal Grandmother 83  . Ulcerative colitis Brother   . Diabetes Neg Hx   . Cancer Neg Hx    Past Surgical History:  Procedure Laterality Date  . CARDIAC CATHETERIZATION N/A 03/29/2015   Procedure: Left Heart Cath and Coronary Angiography;  Surgeon: Belva Crome, MD;  Location: [facility];  Service: Cardiovascular;  Laterality: N/A;  . CATARACT EXTRACTION, BILATERAL    . COLONOSCOPY W/ POLYPECTOMY     X1; negative subsequently  . DILATION AND CURETTAGE OF UTERUS    . EYE SURGERY     due to RA  . EYE SURGERY Left 06/2017   eyelid tendon   . HYSTEROSCOPY WITH D & C  06/24/2012   Procedure: DILATATION AND CURETTAGE /HYSTEROSCOPY;  Surgeon: [name], MD;  Location: [facility];  Service: Gynecology;  Laterality: N/A;  . SHOULDER SURGERY Left    due to RA  . TRABECULECTOMY     X 2 OD; X 1 OS  . 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 Influenza Split 03/08/2014  . Influenza Whole 04/07/2007, 02/24/2008, 03/10/2009, 03/03/2012  . Influenza, High Dose Seasonal PF 01/15/2016, 03/07/2017  . Influenza,inj,Quad PF,6+ Mos 03/15/2013, 02/21/2015, 03/17/2018  . PFIZER SARS-COV-2 Vaccination 06/24/2019, 07/13/2019, 01/15/2020  . PPD Test 03/10/2012  . Pneumococcal Conjugate-13 09/12/2015  . Pneumococcal Polysaccharide-23 03/25/2003, 04/12/2008  . Td 12/14/2009  . Zoster Recombinat (Shingrix) 03/23/2018     Objective: Vital Signs: BP (!) 161/84 (BP Location: Left Arm, Patient Position: Sitting, Cuff Size: Normal)   Pulse 65   Resp 16   Ht 5\' 5"  (1.651 m)   Wt 134 lb 12.8 oz (61.1 kg)   BMI 22.43 kg/m    Physical Exam Vitals and nursing note reviewed.  Constitutional:      Appearance: She is well-developed and well-nourished.  HENT:      Head: Normocephalic and atraumatic.  Eyes:     Extraocular Movements: EOM normal.     Conjunctiva/sclera: Conjunctivae normal.  Cardiovascular:     Rate and Rhythm: Normal rate and regular rhythm.     Pulses: Intact distal pulses.     Heart sounds: Normal heart sounds.  Pulmonary:     Effort: Pulmonary effort is normal.     Breath sounds: Normal breath sounds.  Abdominal:     General: Bowel sounds are normal.     Palpations: Abdomen is soft.  Musculoskeletal:     Cervical back: Normal range of motion.  Lymphadenopathy:     Cervical: No cervical adenopathy.  Skin:    General: Skin is warm and dry.     Capillary Refill: Capillary refill takes less than 2 seconds.  Neurological:     Mental Status: She is alert and oriented to person, place, and time.  Psychiatric:        Mood and Affect: Mood and affect normal.        Behavior: Behavior normal.      Musculoskeletal Exam: C-spine was in good range of motion.  Shoulder joints, elbow joints, wrist joints, MCPs PIPs and DIPs with good range of motion.  She is some synovial thickening but no active synovitis.  She is discomfort range of motion of her lumbar spine.  She had tenderness over left SI joint.  She had painful range of motion of her left hip joint and left knee joint.  No warmth swelling or effusion was noted over her left knee joint.  There was no tenderness over ankles or MTPs or PIPs.  CDAI Exam: CDAI Score: 0.5  Patient Global: 3 mm; Provider Global: 2 mm Swollen: 0 ; Tender: 0  Joint Exam 05/23/2020   No joint exam has been documented for this visit   There is currently no information documented on the homunculus. Go to the Rheumatology activity and complete the homunculus joint exam.  Investigation: No additional findings.  Imaging: No results found.  Recent Labs: Lab Results  Component Value Date   WBC 10.2 04/10/2020   HGB 12.7 04/10/2020   PLT 431 (H) 04/10/2020   NA 130 (L) 04/10/2020   K 5.2  04/10/2020   CL 96 (L) 04/10/2020   CO2 24 04/10/2020   GLUCOSE 91 04/10/2020   BUN 15 04/10/2020   CREATININE 0.81 04/10/2020   BILITOT 0.5 04/10/2020   ALKPHOS 74 11/18/2019   AST 16 04/10/2020   ALT 12 04/10/2020   PROT 6.5 04/10/2020   ALBUMIN 4.1 11/18/2019   CALCIUM 9.9 04/10/2020   GFRAA 81 04/10/2020   QFTBGOLDPLUS NEGATIVE 12/03/2019    Speciality Comments: TB gold neg 11/07/17 Reclast first infusion was given August 10, 2019  Procedures:  No procedures performed Allergies: Sulfonamide derivatives, Amlodipine, Zostavax [zoster vaccine live], and Norvasc [amlodipine besylate]   Assessment / Plan:     Visit Diagnoses: Rheumatoid arthritis involving multiple sites with positive rheumatoid factor (HCC) - +RF, +ANA.  Her arthritis seems to be well controlled without any synovitis.  She states she has intermittent mild flares which do not last for more than 1 or 2 days.  High risk medication use - Orencia 125 mg every 7 days, methotrexate 0.8 mL every 7 days, and folic acid 1 mg 2 tablets daily.  Labs are stable.  Last labs done in November were normal.  She will get labs every 3 months to monitor for drug toxicity.  TB gold was on December 03, 2019.  Primary osteoarthritis of both hands-she has osteoarthritis in her hands which causes a stiffness.  Trochanteric bursitis, left hip-she still have some left trochanteric bursitis.  She states that the last injection lasted for only a month.  The pain has recurred now.  She is uncertain if the pain is coming from the bursitis of her lower back.  Pain in left hip -she complains of discomfort range of motion of her left hip joint.  Plan: XR HIP UNILAT W OR W/O PELVIS 2-3 VIEWS LEFT x-ray of hip joint was unremarkable.  Chronic pain of left knee -she complains of discomfort range of motion of her left knee joint.  No warmth swelling or effusion was noted.  Plan: XR KNEE 3 VIEW LEFT.  X-ray of knee joint was unremarkable.  Primary  osteoarthritis of both knees - bilateral monovisc injections on 07/19/19.   DDD (degenerative disc  disease), cervical-she had good range of motion of her cervical spine.  Chronic midline low back pain with left-sided sciatica -she complains of lower back pain.  Plan: XR Lumbar Spine 2-3 Views.  She has severe scoliosis multilevel spondylosis.  And L4-L5 spondylolisthesis was noted.  I will refer her to Dr. Ernestina Patches for evaluation and injection to relieve her symptoms.  She had MRI with the [facility].  Have advised her to take the copy of her MRI report to Dr. Ernestina Patches.  DDD (degenerative disc disease), lumbar-she has chronic lower back pain.  Age-related osteoporosis without current pathological fracture - DEXA 07/26/2019 right 1/3 distal radius BMD 0.54 with T score -2.5.  Reclast IV infusion was on August 10, 2019.  Other medical problems are listed as follows:  Neuropathy-she is followed by Dr. Leta Baptist.  She is on gabapentin.  Low sodium levels  SIADH (syndrome of inappropriate ADH production) ([place]) - followed by endocrinology.  History of sleep apnea  History of gastroesophageal reflux (GERD)  History of hyperlipidemia  History of colonic polyps  History of glaucoma  Orders: Orders Placed This Encounter  Procedures  . XR HIP UNILAT W OR W/O PELVIS 2-3 VIEWS LEFT  . XR KNEE 3 VIEW LEFT  . XR Lumbar Spine 2-3 Views   No orders of the defined types were placed in this encounter.    Follow-Up Instructions: Return in about 3 months (around 08/21/2020) for Rheumatoid arthritis, Osteoarthritis.   Bo Merino, MD  Note - This record has been created using Editor, commissioning.  Chart creation errors have been sought, but may not always  have been located. Such creation errors do not reflect on  the standard of medical care.

## 2020-05-23 ENCOUNTER — Telehealth: Payer: Self-pay | Admitting: Physical Medicine and Rehabilitation

## 2020-05-23 ENCOUNTER — Telehealth: Payer: Self-pay | Admitting: Diagnostic Neuroimaging

## 2020-05-23 ENCOUNTER — Ambulatory Visit: Payer: Self-pay

## 2020-05-23 ENCOUNTER — Encounter: Payer: Self-pay | Admitting: Rheumatology

## 2020-05-23 ENCOUNTER — Other Ambulatory Visit: Payer: Self-pay

## 2020-05-23 ENCOUNTER — Ambulatory Visit: Payer: Medicare PPO | Admitting: Rheumatology

## 2020-05-23 VITALS — BP 161/84 | HR 65 | Resp 16 | Ht 65.0 in | Wt 134.8 lb

## 2020-05-23 DIAGNOSIS — M19042 Primary osteoarthritis, left hand: Secondary | ICD-10-CM

## 2020-05-23 DIAGNOSIS — M25552 Pain in left hip: Secondary | ICD-10-CM

## 2020-05-23 DIAGNOSIS — M0579 Rheumatoid arthritis with rheumatoid factor of multiple sites without organ or systems involvement: Secondary | ICD-10-CM | POA: Diagnosis not present

## 2020-05-23 DIAGNOSIS — G8929 Other chronic pain: Secondary | ICD-10-CM | POA: Diagnosis not present

## 2020-05-23 DIAGNOSIS — M19041 Primary osteoarthritis, right hand: Secondary | ICD-10-CM | POA: Diagnosis not present

## 2020-05-23 DIAGNOSIS — E222 Syndrome of inappropriate secretion of antidiuretic hormone: Secondary | ICD-10-CM

## 2020-05-23 DIAGNOSIS — M51369 Other intervertebral disc degeneration, lumbar region without mention of lumbar back pain or lower extremity pain: Secondary | ICD-10-CM

## 2020-05-23 DIAGNOSIS — Z79899 Other long term (current) drug therapy: Secondary | ICD-10-CM | POA: Diagnosis not present

## 2020-05-23 DIAGNOSIS — M5030 Other cervical disc degeneration, unspecified cervical region: Secondary | ICD-10-CM | POA: Diagnosis not present

## 2020-05-23 DIAGNOSIS — Z8639 Personal history of other endocrine, nutritional and metabolic disease: Secondary | ICD-10-CM

## 2020-05-23 DIAGNOSIS — M5136 Other intervertebral disc degeneration, lumbar region: Secondary | ICD-10-CM

## 2020-05-23 DIAGNOSIS — Z8669 Personal history of other diseases of the nervous system and sense organs: Secondary | ICD-10-CM

## 2020-05-23 DIAGNOSIS — M170 Bilateral primary osteoarthritis of knee: Secondary | ICD-10-CM

## 2020-05-23 DIAGNOSIS — M5442 Lumbago with sciatica, left side: Secondary | ICD-10-CM

## 2020-05-23 DIAGNOSIS — Z860100 Personal history of colon polyps, unspecified: Secondary | ICD-10-CM

## 2020-05-23 DIAGNOSIS — M7062 Trochanteric bursitis, left hip: Secondary | ICD-10-CM | POA: Diagnosis not present

## 2020-05-23 DIAGNOSIS — E871 Hypo-osmolality and hyponatremia: Secondary | ICD-10-CM

## 2020-05-23 DIAGNOSIS — Z8719 Personal history of other diseases of the digestive system: Secondary | ICD-10-CM

## 2020-05-23 DIAGNOSIS — M25562 Pain in left knee: Secondary | ICD-10-CM | POA: Diagnosis not present

## 2020-05-23 DIAGNOSIS — G629 Polyneuropathy, unspecified: Secondary | ICD-10-CM

## 2020-05-23 DIAGNOSIS — M810 Age-related osteoporosis without current pathological fracture: Secondary | ICD-10-CM

## 2020-05-23 NOTE — Telephone Encounter (Signed)
Called pt back and sch appt 06/22/20. Will try to work on pt MRI.

## 2020-05-23 NOTE — Telephone Encounter (Signed)
Pt called about you leaving a voicemail, please give her a call back please. [phone]

## 2020-05-23 NOTE — Telephone Encounter (Signed)
Called pt back and LVM #1

## 2020-05-23 NOTE — Telephone Encounter (Signed)
Called pt back and sch appt 06/22/20. Will try to work on pt MRI. °

## 2020-05-23 NOTE — Telephone Encounter (Signed)
Patient called advised she was referred to Dr. Ernestina Patches by Dr Estanislado Pandy. The number to contact patient is [phone]

## 2020-05-23 NOTE — Telephone Encounter (Signed)
I called pt. She wants to know if we ordered a lumbar MRI on her in 2019. It appears that we only did a cervical MRI in 2019. Dr. Ernestina Patches at [facility] requested this information. I advised her that if she needs a CD of these images she will need to contact Max at [phone]. Pt will follow up as scheduled in 06/28/2020 with Amy, NP.

## 2020-05-23 NOTE — Patient Instructions (Signed)
Standing Labs We placed an order today for your standing lab work.   Please have your standing labs drawn in February and every 3 months    If possible, please have your labs drawn 2 weeks prior to your appointment so that the provider can discuss your results at your appointment.  We have open lab daily Monday through Thursday from 8:30-12:30 PM and 1:30-4:30 PM and Friday from 8:30-12:30 PM and 1:30-4:00 PM at the office of Dr. Charline Hoskinson, [facility].   Please be advised, patients with office appointments requiring lab work will take precedents over walk-in lab work.  If possible, please come for your lab work on Monday and Friday afternoons, as you may experience shorter wait times. The office is located at [address], [address], [place], [place] [address] No appointment is necessary.   Labs are drawn by [facility]. Please bring your co-pay at the time of your lab draw.  You may receive a bill from [facility] for your lab work.  If you wish to have your labs drawn at another location, please call the office 24 hours in advance to send orders.  If you have any questions regarding directions or hours of operation,  please call [phone].   As a reminder, please drink plenty of water prior to coming for your lab work. Thanks!   

## 2020-05-23 NOTE — Telephone Encounter (Signed)
Pt. is asking for MRI results from 2019 & to also report that she is having pain. Please advise.

## 2020-05-23 NOTE — Telephone Encounter (Signed)
Pt called asking for a CB in regards to getting a disc containing MRI's from 2019  [phone]

## 2020-05-24 ENCOUNTER — Telehealth: Payer: Self-pay | Admitting: Physical Medicine and Rehabilitation

## 2020-05-24 NOTE — Telephone Encounter (Signed)
Pt called and said [facility] imaging said that we could get the images through our system. Then stated that they said worse case they'll send them if not and I told her to just go ahead and have them send them over to [place] to be a safe. Please give her a phone call please at [phone].

## 2020-05-24 NOTE — Telephone Encounter (Signed)
What do I need to do for this one?

## 2020-05-25 ENCOUNTER — Other Ambulatory Visit: Payer: Self-pay | Admitting: Rheumatology

## 2020-05-25 NOTE — Telephone Encounter (Signed)
Last Visit: 05/23/2020  Next Visit: 08/22/2020 Labs: 04/10/2020 Plt count is mildly elevated-431. Rest of CBC WNL.  Sodium and chloride are low and trending down. Rest of CMP WNL.   Current Dose per office note 05/04/2020: methotrexate 0.8 mL every 7 days DX: Rheumatoid arthritis involving multiple sites with positive rheumatoid factor   Okay to refill MTX?

## 2020-05-30 ENCOUNTER — Telehealth: Payer: Self-pay | Admitting: Physical Medicine and Rehabilitation

## 2020-05-30 NOTE — Telephone Encounter (Signed)
Pt called stating she needed some clarity on her MRI that she was supposed to have; she also stated her back pain has been giving her a fit. She's tried icing and lidocaine but it doesn't seem to be helping. Pt would like to know what else she can do.  [phone]

## 2020-06-09 ENCOUNTER — Telehealth: Payer: Self-pay | Admitting: Physical Medicine and Rehabilitation

## 2020-06-09 NOTE — Telephone Encounter (Signed)
Pt called stating she was under the impression she would need an MRI of her lumbar before her appt with Dr.Newton on 06/22/20 if this is the case she will need a referral for put in as no one has called her to set anything up. Pt would like a CB to let her know if she does or doesn't need a MRI.   [phone]

## 2020-06-12 NOTE — Telephone Encounter (Signed)
Returned pt call  

## 2020-06-15 ENCOUNTER — Other Ambulatory Visit: Payer: Self-pay

## 2020-06-15 ENCOUNTER — Encounter: Payer: Self-pay | Admitting: Physical Medicine and Rehabilitation

## 2020-06-15 ENCOUNTER — Ambulatory Visit (INDEPENDENT_AMBULATORY_CARE_PROVIDER_SITE_OTHER): Payer: Medicare PPO | Admitting: Physical Medicine and Rehabilitation

## 2020-06-15 ENCOUNTER — Ambulatory Visit: Payer: Self-pay

## 2020-06-15 VITALS — BP 188/87 | HR 65

## 2020-06-15 DIAGNOSIS — M5136 Other intervertebral disc degeneration, lumbar region: Secondary | ICD-10-CM

## 2020-06-15 DIAGNOSIS — M419 Scoliosis, unspecified: Secondary | ICD-10-CM

## 2020-06-15 DIAGNOSIS — G629 Polyneuropathy, unspecified: Secondary | ICD-10-CM

## 2020-06-15 DIAGNOSIS — M5416 Radiculopathy, lumbar region: Secondary | ICD-10-CM

## 2020-06-15 DIAGNOSIS — M47816 Spondylosis without myelopathy or radiculopathy, lumbar region: Secondary | ICD-10-CM

## 2020-06-15 DIAGNOSIS — M0579 Rheumatoid arthritis with rheumatoid factor of multiple sites without organ or systems involvement: Secondary | ICD-10-CM

## 2020-06-15 MED ORDER — BETAMETHASONE SOD PHOS & ACET 6 (3-3) MG/ML IJ SUSP
12.0000 mg | Freq: Once | INTRAMUSCULAR | Status: AC
  Administered 2020-06-15: 12 mg

## 2020-06-15 NOTE — Patient Instructions (Signed)

## 2020-06-15 NOTE — Progress Notes (Signed)
Pt state lower back pain that travels down her left knee and ankle. Pt state when she tries to get up from a sitting position the pain gets worse. Pt state she take over the counter meds and use ice to help ease her pain.  Numeric Pain Rating Scale and Functional Assessment Average Pain 7   In the last MONTH (on 0-10 scale) has pain interfered with the following?  1. General activity like being  able to carry out your everyday physical activities such as walking, climbing stairs, carrying groceries, or moving a chair?  Rating(7)   +Driver, -BT, -Dye Allergies.

## 2020-06-16 ENCOUNTER — Encounter: Payer: Self-pay | Admitting: Physical Medicine and Rehabilitation

## 2020-06-16 DIAGNOSIS — G629 Polyneuropathy, unspecified: Secondary | ICD-10-CM | POA: Insufficient documentation

## 2020-06-16 NOTE — Procedures (Signed)
Lumbar Epidural Steroid Injection - Interlaminar Approach with Fluoroscopic Guidance  Patient: Felicia [name]      Date of Birth: Oct 28, 1940 MRN: [id number] PCP: Kathyrn Lass, MD      Visit Date: 06/15/2020   Universal Protocol:     Consent Given By: the patient  Position: PRONE  Additional Comments: Vital signs were monitored before and after the procedure. Patient was prepped and draped in the usual sterile fashion. The correct patient, procedure, and site was verified.   Injection Procedure Details:   Procedure diagnoses: Lumbar radiculopathy [M54.16]   Meds Administered:  Meds ordered this encounter  Medications  . betamethasone acetate-betamethasone sodium phosphate (CELESTONE) injection 12 mg     Laterality: Left  Location/Site:  L5-S1  Needle: 3.5 in., 20 ga. Tuohy  Needle Placement: Paramedian epidural  Findings:   -Comments: Excellent flow of contrast into the epidural space.  Procedure Details: Using a paramedian approach from the side mentioned above, the region overlying the inferior lamina was localized under fluoroscopic visualization and the soft tissues overlying this structure were infiltrated with 4 ml. of 1% Lidocaine without Epinephrine. The Tuohy needle was inserted into the epidural space using a paramedian approach.   The epidural space was localized using loss of resistance along with counter oblique bi-planar fluoroscopic views.  After negative aspirate for air, blood, and CSF, a 2 ml. volume of Isovue-250 was injected into the epidural space and the flow of contrast was observed. Radiographs were obtained for documentation purposes.    The injectate was administered into the level noted above.   Additional Comments:  The patient tolerated the procedure well Dressing: 2 x 2 sterile gauze and Band-Aid    Post-procedure details: Patient was observed during the procedure. Post-procedure instructions were reviewed.  Patient left the clinic in  stable condition.

## 2020-06-16 NOTE — Progress Notes (Signed)
Felicia [name] - 80 y.o. female MRN [id number]  Date of birth: [birth year]  Office Visit Note: Visit Date: 06/15/2020 PCP: Kathyrn Lass, MD Referred by: Kathyrn Lass, MD  Subjective: Chief Complaint  Patient presents with   Lower Back - Pain   Left Knee - Pain, Weakness   Left Ankle - Pain   Left Hip - Pain   HPI: Felicia [name] is a 81 y.o. female who comes in today For evaluation and management for chronic worsening low back pain and left radicular type leg pain at the request of Dr. Cy Blamer.  Patient rates her pain as a 7 out of 10 and worsening.  She reports pain that really goes from the lower back centralized and across the lower back down the left posterior lateral leg into the knee and sometimes into the lateral calf.  She denies any symptoms down to the feet but she does have a history of neuropathy that she reports taking some gabapentin for.  She is followed by Dr. Leta Baptist at [facility].  His notes reviewed.  She had electrodiagnostic study performed showing peripheral polyneuropathy which the have diagnosed as either idiopathic or autoimmune.  She reports no specific recent injury just increasing pain in the hip and leg.  She has had a history of off-and-on back pain.  She has had no history of lumbar surgery or injections in the past.  She has had physical therapy in the past.  She also suffers from neck pain with some referral pain and again this is followed by Dr. Leta Baptist.  There is a cervical MRI showing some level of mild to moderate central stenosis and some by foraminal stenosis particular at C5-6.  No high-grade stenosis noted.  No myelopathic changes noted.  She had a list of questions for me today which we did go over in length.  These were basically questions about procedures and spinal cord and her condition in general.  Dr. Estanislado Pandy had thought she had an MRI of her lumbar spine which she does not have.  Patient is not on any blood thinners  etc.  No history of diabetes.  There were x-rays obtained by Dr. Estanislado Pandy of the patient's lumbar spine and pelvis and hips.  She does not have much in the way of hip arthritis.  No fractures or dislocations.  X-rays did reveal to me mild rotatory scoliosis centered to the right at about L1-2.  Dr. Estanislado Pandy noted severe scoliosis.  She does have degenerative disc changes with disc height loss at L3-4 L4-5 and L5-S1.  There is by foraminal narrowing at L5-S1.  There is small stairstep listhesis of L3 on 4 L4 on 5.  These are just a few millimeters.  She denies any symptoms down the right leg.  She denies focal weakness or night pain etc.  She does get symptoms worsening from going to sit to stand.  She does report using over-the-counter medications for pain relief along with ice.  She does have a history of rheumatoid arthritis and she does take medications for that which are listed.  She also uses Voltaren gel and again a small dose of gabapentin.  Review of Systems  Musculoskeletal: Positive for back pain and joint pain.  Neurological: Positive for tingling.  All other systems reviewed and are negative.  Otherwise per HPI.  Assessment & Plan: Visit Diagnoses:    ICD-10-CM   1. Lumbar radiculopathy  M54.16 XR C-ARM NO REPORT    Epidural Steroid  injection    betamethasone acetate-betamethasone sodium phosphate (CELESTONE) injection 12 mg  2. Scoliosis of lumbar spine, unspecified scoliosis type  M41.9   3. Spondylosis without myelopathy or radiculopathy, lumbar region  M47.816   4. Rheumatoid arthritis involving multiple sites with positive rheumatoid factor (HCC)  M05.79   5. Neuropathy  G62.9   6. Other intervertebral disc degeneration, lumbar region  M51.36 MR LUMBAR SPINE WO CONTRAST     Plan: Findings:  Chronic worsening severe axial low back pain worse with going sit to stand and standing referral pattern into the left hip and thigh in a pretty consistent L5 distribution.  She does have a  history of multiple joint arthritis and can have pain from those areas 2.  No pain with hip rotation no groin pain.  X-rays look pretty good with the hips.  Lumbar spine does show by foraminal narrowing at L5-S1 with a small listhesis at L4-5.  She could have some level of stenosis at that level centrally she could have pain just from the facet joints which does correspond with some of her pain as well.  We discussed this at length with her and answered all her questions.  We decided from a diagnostic and therapeutic standpoint to complete a general interlaminar epidural steroid injection today on the left at L5-S1.  Depending on relief with that could look at differential injection depending on results of MRI.  I do want to get an MRI of her lower back simply because she has not had 1 and because of her age and continued severe complaints with this radicular pain although she has no red flag symptoms.  Again no trauma no history of compression fracture and none seen on current imaging of x-rays.  Significant scoliosis and listhesis.  She will continue with gabapentin and follow-up with neurology for the peripheral polyneuropathy.    Meds & Orders:  Meds ordered this encounter  Medications   betamethasone acetate-betamethasone sodium phosphate (CELESTONE) injection 12 mg    Orders Placed This Encounter  Procedures   XR C-ARM NO REPORT   MR LUMBAR SPINE WO CONTRAST   Epidural Steroid injection    Follow-up: Return for MRI review after completion.   Procedures: No procedures performed  Lumbar Epidural Steroid Injection - Interlaminar Approach with Fluoroscopic Guidance  Patient: Felicia [name]      Date of Birth: June 27, 1940 MRN: [id number] PCP: Kathyrn Lass, MD      Visit Date: 06/15/2020   Universal Protocol:     Consent Given By: the patient  Position: PRONE  Additional Comments: Vital signs were monitored before and after the procedure. Patient was prepped and draped in the usual  sterile fashion. The correct patient, procedure, and site was verified.   Injection Procedure Details:   Procedure diagnoses: Lumbar radiculopathy [M54.16]   Meds Administered:  Meds ordered this encounter  Medications   betamethasone acetate-betamethasone sodium phosphate (CELESTONE) injection 12 mg     Laterality: Left  Location/Site:  L5-S1  Needle: 3.5 in., 20 ga. Tuohy  Needle Placement: Paramedian epidural  Findings:   -Comments: Excellent flow of contrast into the epidural space.  Procedure Details: Using a paramedian approach from the side mentioned above, the region overlying the inferior lamina was localized under fluoroscopic visualization and the soft tissues overlying this structure were infiltrated with 4 ml. of 1% Lidocaine without Epinephrine. The Tuohy needle was inserted into the epidural space using a paramedian approach.   The epidural space was localized using loss  of resistance along with counter oblique bi-planar fluoroscopic views.  After negative aspirate for air, blood, and CSF, a 2 ml. volume of Isovue-250 was injected into the epidural space and the flow of contrast was observed. Radiographs were obtained for documentation purposes.    The injectate was administered into the level noted above.   Additional Comments:  The patient tolerated the procedure well Dressing: 2 x 2 sterile gauze and Band-Aid    Post-procedure details: Patient was observed during the procedure. Post-procedure instructions were reviewed.  Patient left the clinic in stable condition.     Clinical History: 03/26/18 EMG/NCS -Axonal sensorimotor polyneuropathy.  Autoimmune versus idiopathic.   She reports that she quit smoking about 49 years ago. Her smoking use included cigarettes. She has a 2.50 pack-year smoking history. She has never used smokeless tobacco. No results for input(s): HGBA1C, LABURIC in the last 8760 hours.  Objective:  VS:  HT:     WT:    BMI:       BP:(!) 188/87   HR:65bpm   TEMP: ( )   RESP:  Physical Exam Vitals and nursing note reviewed.  Constitutional:      General: She is not in acute distress.    Appearance: Normal appearance. She is not ill-appearing.  HENT:     Head: Normocephalic and atraumatic.     Right Ear: External ear normal.     Left Ear: External ear normal.  Eyes:     Extraocular Movements: Extraocular movements intact.  Cardiovascular:     Rate and Rhythm: Normal rate.     Pulses: Normal pulses.  Pulmonary:     Effort: Pulmonary effort is normal. No respiratory distress.  Abdominal:     General: There is no distension.     Palpations: Abdomen is soft.  Musculoskeletal:        General: Tenderness present.     Cervical back: Neck supple.     Right lower leg: No edema.     Left lower leg: No edema.     Comments: Patient has good distal strength with no pain over the greater trochanters.  No clonus or focal weakness. Patient somewhat slow to rise from a seated position to full extension.  There is concordant low back pain with facet loading and lumbar spine extension rotation.  There are no definitive trigger points but the patient is somewhat tender across the lower back and PSIS.  There is no pain with hip rotation.   Skin:    Findings: No erythema, lesion or rash.  Neurological:     General: No focal deficit present.     Mental Status: She is alert and oriented to person, place, and time.     Sensory: No sensory deficit.     Motor: No weakness or abnormal muscle tone.     Coordination: Coordination normal.     Gait: Gait abnormal.  Psychiatric:        Mood and Affect: Mood normal.        Behavior: Behavior normal.     Ortho Exam  Imaging: XR C-ARM NO REPORT  Result Date: 06/15/2020 Please see Notes tab for imaging impression.   Past Medical/Family/Surgical/Social History: Medications & Allergies reviewed per EMR, new medications updated. Patient Active Problem List   Diagnosis Date Noted    Neuropathy 06/16/2020   SIADH (syndrome of inappropriate ADH production) ([place]) 02/18/2020   Hyponatremia 11/18/2019   Thrombocytosis 08/01/2018   URI (upper respiratory infection) 04/14/2017   Primary osteoarthritis  of both knees 03/06/2017   Exertional chest pain    Pain in the chest 03/27/2015   Postural hypotension 05/26/2013   OSA (obstructive sleep apnea) 03/22/2013   Osteoarthritis 11/07/2011   Abdominal bruit 10/16/2011   HTN (hypertension) 08/13/2011   Anisocoria 08/13/2011   Cervical radiculopathy 07/17/2011   DDD (degenerative disc disease), cervical 07/10/2011   Glaucoma 06/06/2011   HIATAL HERNIA 01/30/2010   POSTMENOPAUSAL SYNDROME 12/14/2009   EUSTACHIAN TUBE DYSFUNCTION, RIGHT 06/28/2009   GERD 12/07/2008   Rheumatoid arthritis ([place]) 12/07/2008   COLONIC POLYPS, HX OF 12/07/2008   Hyperlipidemia LDL goal <70 10/17/2006   HYPERCALCEMIA 10/17/2006   Mitral valve disease 10/17/2006   Past Medical History:  Diagnosis Date   Adenomatous colon polyp 2004   Colo in 2009 "no polyps"   Complication of anesthesia    small airway per patient   GERD (gastroesophageal reflux disease)    Glaucoma    Hiatal hernia    Hypertension    Neuropathy    Rheumatoid arthritis(714.0)    [place]cia; Methotrexate (Dr. Patrecia Pour)   Sleep apnea    on CPAP   Family History  Problem Relation Age of Onset   Dementia Mother    Lung disease Mother        bronchiectasis   Heart disease Mother        Aortic Stenosis   Osteoporosis Mother    Heart attack Father 33       S/P CBAG   Stroke Maternal Grandmother 83   Ulcerative colitis Brother    Diabetes Neg Hx    Cancer Neg Hx    Past Surgical History:  Procedure Laterality Date   CARDIAC CATHETERIZATION N/A 03/29/2015   Procedure: Left Heart Cath and Coronary Angiography;  Surgeon: Belva Crome, MD;  Location: [facility];  Service: Cardiovascular;  Laterality: N/A;   CATARACT  EXTRACTION, BILATERAL     COLONOSCOPY W/ POLYPECTOMY     X1; negative subsequently   DILATION AND CURETTAGE OF UTERUS     EYE SURGERY     due to RA   EYE SURGERY Left 06/2017   eyelid tendon    HYSTEROSCOPY WITH D & C  06/24/2012   Procedure: DILATATION AND CURETTAGE /HYSTEROSCOPY;  Surgeon: [name], MD;  Location: [facility];  Service: Gynecology;  Laterality: N/A;   SHOULDER SURGERY Left    due to RA   TRABECULECTOMY     X 2 OD; X 1 OS   UPPER GASTROINTESTINAL ENDOSCOPY     hiatal hernia   Social History   Occupational History   Occupation: retired    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

## 2020-06-20 ENCOUNTER — Other Ambulatory Visit: Payer: Self-pay | Admitting: Physical Medicine and Rehabilitation

## 2020-06-20 ENCOUNTER — Telehealth: Payer: Self-pay

## 2020-06-20 DIAGNOSIS — F411 Generalized anxiety disorder: Secondary | ICD-10-CM

## 2020-06-20 MED ORDER — DIAZEPAM 5 MG PO TABS: ORAL_TABLET | ORAL | 0 refills | Status: DC

## 2020-06-20 NOTE — Telephone Encounter (Signed)
done

## 2020-06-20 NOTE — Telephone Encounter (Signed)
Pt has requested Valium for her MRI appt on 1/30 @ 1:20p

## 2020-06-20 NOTE — Progress Notes (Signed)
Pre-procedure diazepam ordered for pre-operative anxiety.  

## 2020-06-22 ENCOUNTER — Ambulatory Visit: Payer: Medicare PPO | Admitting: Physical Medicine and Rehabilitation

## 2020-06-28 ENCOUNTER — Ambulatory Visit: Payer: Medicare PPO | Admitting: Family Medicine

## 2020-06-28 ENCOUNTER — Encounter: Payer: Self-pay | Admitting: Family Medicine

## 2020-06-28 ENCOUNTER — Ambulatory Visit: Payer: Medicare PPO | Admitting: Nurse Practitioner

## 2020-06-28 ENCOUNTER — Encounter: Payer: Self-pay | Admitting: Nurse Practitioner

## 2020-06-28 ENCOUNTER — Other Ambulatory Visit: Payer: Self-pay

## 2020-06-28 VITALS — BP 138/78 | HR 68 | Ht 65.0 in | Wt 134.2 lb

## 2020-06-28 VITALS — BP 161/74 | HR 64 | Ht 65.0 in | Wt 132.0 lb

## 2020-06-28 DIAGNOSIS — M79605 Pain in left leg: Secondary | ICD-10-CM

## 2020-06-28 DIAGNOSIS — K5900 Constipation, unspecified: Secondary | ICD-10-CM | POA: Diagnosis not present

## 2020-06-28 DIAGNOSIS — M545 Low back pain, unspecified: Secondary | ICD-10-CM

## 2020-06-28 DIAGNOSIS — G629 Polyneuropathy, unspecified: Secondary | ICD-10-CM | POA: Diagnosis not present

## 2020-06-28 DIAGNOSIS — M5412 Radiculopathy, cervical region: Secondary | ICD-10-CM

## 2020-06-28 NOTE — Patient Instructions (Addendum)
If you are age 79 or older, your body mass index should be between 23-30. Your Body mass index is 22.33 kg/m. If this is out of the aforementioned range listed, please consider follow up with your Primary Care Provider.  We have given you samples of the following medication to take: IBgard 1 capsule twice a day to reduce gas and bloating.  Benefiber- 1 tablespoon twice a day. Stop if bloating occurs.  Miralax. Decrease to 1/2 capful in 8 ounces of water and drink before bed.  We can do a trial of Linzess if you do not have any improvement.  Please call us in 4 weeks with an update.  We will get your imaging records from the pain clinic.  It was great seeing you today!  Thank you for entrusting me with your care and choosing [facility].  Noralyn Pick, CRNP

## 2020-06-28 NOTE — Progress Notes (Signed)
Chief Complaint  Patient presents with  . Injections    Rm 1 alone PT is well, medication is helping      HISTORY OF PRESENT ILLNESS: Today 06/28/20  Felicia [name] is a 80 y.o. female here today for follow up for neuropathy. She was last seen 06/2019 and doing well on gabapentin 100mg  in the morning and 200mg  at bedtime. Pain started to increase around 04/2020. Gabapentin was increased to 100mg  in the mornings and 300mg  at night. She feels that this has helped with numbness and cramping.   She has been diagnosed with bursitis over this past year as well. She is followed by Dr Estanislado Pandy for RA. She has received several steroid injections that have helped but pain continued in lumbar area and left leg. Dr Estanislado Pandy referred her to Dr Ernestina Patches for pain management. She has received LSI that has helped some. She has an MRI scheduled 07/02/2020.    HISTORY (copied from Dr Gladstone Lighter previous note)  - stable; gabapentin 100 / 200; neuropathy pain better - no major neck pain; arms are stable - no falls or major gait issues   REVIEW OF SYSTEMS: Out of a complete 14 system review of symptoms, the patient complains only of the following symptoms, neck pain, numbness of hands and feet, lumbar pain, stiffness of fingers and all other reviewed systems are negative.   ALLERGIES: Allergies  Allergen Reactions  . Sulfonamide Derivatives     Rash, redness  . Amlodipine Swelling  . Norvasc [Amlodipine Besylate]     edema     HOME MEDICATIONS: Outpatient Medications Prior to Visit  Medication Sig Dispense Refill  . Apoaequorin (PREVAGEN PO) Take by mouth daily.    . Calcium Carbonate-Vitamin D 600-400 MG-UNIT tablet Take 2 tablets by mouth daily.     . Carboxymethylcellulose Sodium (REFRESH OP) Apply 1 drop to eye daily as needed (FOR DRY EYES).    . Cholecalciferol (VITAMIN D3) 1000 UNITS CAPS Take 1 capsule by mouth every other day.    . Cyanocobalamin (VITAMIN B-12) 2500 MCG SUBL Place  under the tongue.    . diazepam (VALIUM) 5 MG tablet Take 1 by mouth 1 hour  pre-procedure with very light food. May bring 2nd tablet to appointment. 2 tablet 0  . diclofenac Sodium (VOLTAREN) 1 % GEL Voltaren 1 % topical gel  apply 3 grams to LARGE JOINTS UP TO THREE TIMES A DAY    . esomeprazole (NEXIUM) 40 MG capsule TAKE ONE CAPSULE BY MOUTH TWICE DAILY BEFORE A MEAL 180 capsule 3  . estradiol (ESTRACE) 1 MG tablet Take 1 mg by mouth daily.  0  . folic acid (FOLVITE) 1 MG tablet TAKE 2 TABLETS(2 MG) BY MOUTH DAILY 180 tablet 3  . gabapentin (NEURONTIN) 100 MG capsule Take 100 mg in morning, 300 mg at night 360 capsule 4  . Krill Oil Omega-3 300 MG CAPS Take 1 capsule by mouth daily.    Marland Kitchen latanoprost (XALATAN) 0.005 % ophthalmic solution Place 1 drop into the right eye at bedtime.    Marland Kitchen losartan (COZAAR) 100 MG tablet Take 100 mg by mouth daily.    . methotrexate 50 MG/2ML injection ADMINISTER 0.8 ML(20 MG) UNDER THE SKIN 1 TIME A WEEK 10 mL 0  . metoprolol (LOPRESSOR) 50 MG tablet Take 50 mg by mouth daily.   0  . metroNIDAZOLE (METROGEL) 0.75 % gel Apply 1 application topically 2 (two) times daily.     . MULTIPLE VITAMIN PO  Take by mouth.    Maureen Chatters 125 MG/ML SOSY INJECT 125MG  SUBCUTANEOUSLY WEEKLY 12 mL 0  . pravastatin (PRAVACHOL) 20 MG tablet Take 1 tablet (20 mg total) by mouth every evening. 30 tablet 11  . progesterone (PROMETRIUM) 200 MG capsule Take 200 mg by mouth daily.    . timolol (TIMOPTIC) 0.5 % ophthalmic solution Place 1 drop into both eyes daily.     . TUBERCULIN SYR 1CC/27GX1/2" (B-D TB SYRINGE 1CC/27GX1/2") 27G X 1/2" 1 ML MISC USE WITH METHOTREXATE 12 each 2  . valACYclovir (VALTREX) 1000 MG tablet Take 1,000 mg by mouth as needed (fever blister). Reported on 06/14/2015    . Wheat Dextrin (BENEFIBER DRINK MIX PO) Take at bedtime by mouth.     No facility-administered medications prior to visit.     PAST MEDICAL HISTORY: Past Medical History:  Diagnosis Date  .  Adenomatous colon polyp 2004   Colo in 2009 "no polyps"  . Complication of anesthesia    small airway per patient  . GERD (gastroesophageal reflux disease)   . Glaucoma   . Hiatal hernia   . Hypertension   . Neuropathy   . Rheumatoid arthritis(714.0)    [place]; Methotrexate (Dr. Patrecia Pour)  . Sleep apnea    on CPAP     PAST SURGICAL HISTORY: Past Surgical History:  Procedure Laterality Date  . CARDIAC CATHETERIZATION N/A 03/29/2015   Procedure: Left Heart Cath and Coronary Angiography;  Surgeon: Belva Crome, MD;  Location: [facility];  Service: Cardiovascular;  Laterality: N/A;  . CATARACT EXTRACTION, BILATERAL    . COLONOSCOPY W/ POLYPECTOMY     X1; negative subsequently  . DILATION AND CURETTAGE OF UTERUS    . EYE SURGERY     due to RA  . EYE SURGERY Left 06/2017   eyelid tendon   . HYSTEROSCOPY WITH D & C  06/24/2012   Procedure: DILATATION AND CURETTAGE /HYSTEROSCOPY;  Surgeon: [name], MD;  Location: [facility];  Service: Gynecology;  Laterality: N/A;  . SHOULDER SURGERY Left    due to RA  . TRABECULECTOMY     X 2 OD; X 1 OS  . UPPER GASTROINTESTINAL ENDOSCOPY     hiatal hernia     FAMILY HISTORY: Family History  Problem Relation Age of Onset  . Dementia Mother   . Lung disease Mother        bronchiectasis  . Heart disease Mother        Aortic Stenosis  . Osteoporosis Mother   . Heart attack Father 43       S/P CBAG  . Stroke Maternal Grandmother 83  . Ulcerative colitis Brother   . Diabetes Neg Hx   . Cancer Neg Hx      SOCIAL HISTORY: Social History   Socioeconomic History  . Marital status: Single    Spouse name: Not on file  . Number of children: 0  . Years of education: Not on file  . Highest education level: Not on file  Occupational History  . 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 Smoking status: Former Smoker    Packs/day: 0.50     Years: 5.00    Pack years: 2.50    Types: Cigarettes    Quit date: 06/04/1971    Years since quitting: 49.1  . Smokeless tobacco: Never Used  . Tobacco comment: smoked about 3-5 years , up to  1/2 ppd  Vaping Use  . Vaping Use: Never used  Substance and Sexual Activity  . Alcohol use: No  . Drug use: No  . Sexual activity: Yes    Birth control/protection: Post-menopausal  Other Topics Concern  . 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   Generalized: Well developed, in no acute distress  Cardiology: normal rate and rhythm, no murmur auscultated  Respiratory: clear to auscultation bilaterally    Neurological examination  Mentation: Alert oriented to time, place, history taking. Follows all commands speech and language fluent Cranial nerve II-XII: Pupils were equal round reactive to light. Extraocular movements were full, visual field were full on confrontational test. Facial sensation and strength were normal. Head turning and shoulder shrug  were normal and symmetric. Motor: The motor testing reveals 5 over 5 strength of all 4 extremities. Good symmetric motor tone is noted throughout.  Sensory: Sensory testing is intact to soft touch on all 4 extremities. No evidence of extinction is noted. Vibration intact bilaterally  Coordination: Cerebellar testing reveals good finger-nose-finger and heel-to-shin bilaterally.  Gait and station: Gait is normal.  Reflexes: Deep tendon reflexes are  brisk but symmetric bilaterally.     DIAGNOSTIC DATA (LABS, IMAGING, TESTING) - I reviewed patient records, labs, notes, testing and imaging myself where available.  Lab Results  Component Value Date   WBC 10.2 04/10/2020   HGB 12.7 04/10/2020   HCT 38.6 04/10/2020   MCV 94.8 04/10/2020   PLT 431 (H) 04/10/2020      Component Value Date/Time   NA 130 (L) 04/10/2020 1054   K 5.2 04/10/2020 1054   CL 96 (L) 04/10/2020 1054   CO2 24 04/10/2020 1054   GLUCOSE 91 04/10/2020 1054   BUN 15 04/10/2020 1054   CREATININE 0.81 04/10/2020 1054   CALCIUM 9.9 04/10/2020 1054   PROT 6.5 04/10/2020 1054   PROT 6.3 10/20/2019 0841   ALBUMIN 4.1 11/18/2019 0855   ALBUMIN 4.1 10/20/2019 0841   AST 16 04/10/2020 1054   ALT 12 04/10/2020 1054   ALKPHOS 74 11/18/2019 0855   BILITOT 0.5 04/10/2020 1054   BILITOT 0.4 10/20/2019 0841   GFRNONAA 70 04/10/2020 1054   GFRAA 81 04/10/2020 1054   Lab Results  Component Value Date   CHOL 146 10/20/2019   HDL 59 10/20/2019   LDLCALC 72 10/20/2019   LDLDIRECT 160.4 12/14/2009   TRIG 78 10/20/2019   CHOLHDL 2.5 10/20/2019   Lab Results  Component Value Date   HGBA1C 5.9 (H) 02/25/2018   Lab Results  Component Value Date   [id number] 034 08/01/2018   Lab Results  Component Value Date   TSH 1.77 07/30/2019    No flowsheet data found.   ASSESSMENT AND PLAN  80 y.o. year old female  has a past medical history of Adenomatous colon polyp (7425), Complication of anesthesia, GERD (gastroesophageal  reflux disease), Glaucoma, Hiatal hernia, Hypertension, Neuropathy, Rheumatoid arthritis(714.0), and Sleep apnea. here with   Neuropathy  Cervical radiculopathy  Lumbar pain with radiation down left leg  Kimoni continues to do fairly well from a neuropathy standpoint.  Gabapentin helps to manage symptoms.  We will continue 100 mg in the morning and 300 mg at bedtime.  Side effects reviewed.  She will continue close follow-up with care team.  She  has an MRI of the lumbar spine scheduled for 07/02/2020.  She will continue close follow-up with Dr. Ernestina Patches for pain management of lumbar pain.  We have reviewed her blood pressure reading in the office.  She reports it is normally not elevated at home.  She will discuss with Dr. Sabra Heck if blood pressure remains elevated.  Healthy lifestyle habits encouraged.  She will follow-up with [place] in 1 year, sooner if needed. Refills UTD. She verbalizes understanding and agreement with this plan.  No orders of the defined types were placed in this encounter.     I spent 20 minutes of face-to-face and non-face-to-face time with patient.  This included previsit chart review, lab review, study review, order entry, electronic health record documentation, patient education.    Debbora Presto, MSN, FNP-C 06/28/2020, 12:06 PM  [facility] [address], [place], [place] [address] [phone]

## 2020-06-28 NOTE — Patient Instructions (Signed)
Below is our plan:  We will continue gabapentin 100mg  in the mornings and 300mg  in the evenings. Continue to be active. Continue close follow up with Dr Ernestina Patches. I will listen out for report from lumbar MRI and Dr Romona Curls plans for management following imaging. Please let us know if you need [place].   Please make sure you are staying well hydrated. I recommend 50-60 ounces daily. Well balanced diet and regular exercise encouraged.    Please continue follow up with care team as directed.   Follow up with me in 1 year, sooner if needed   You may receive a survey regarding today's visit. I encourage you to leave honest feed back as I do use this information to improve patient care. Thank you for seeing me today!     Radicular Pain Radicular pain is a type of pain that spreads from your back or neck along a spinal nerve. Spinal nerves are nerves that leave the spinal cord and go to the muscles. Radicular pain is sometimes called radiculopathy, radiculitis, or a pinched nerve. When you have this type of pain, you may also have weakness, numbness, or tingling in the area of your body that is supplied by the nerve. The pain may feel sharp and burning. Depending on which spinal nerve is affected, the pain may occur in the:  Neck area (cervical radicular pain). You may also feel pain, numbness, weakness, or tingling in the arms.  Mid-spine area (thoracic radicular pain). You would feel this pain in the back and chest. This type is rare.  Lower back area (lumbar radicular pain). You would feel this pain as low back pain. You may feel pain, numbness, weakness, or tingling in the buttocks or legs. Sciatica is a type of lumbar radicular pain that shoots down the back of the leg. Radicular pain occurs when one of the spinal nerves becomes irritated or squeezed (compressed). It is often caused by something pushing on a spinal nerve, such as one of the bones of the spine (vertebrae) or one of the round cushions  between vertebrae (intervertebral disks). This can result from:  An injury.  Wear and tear or aging of a disk.  The growth of a bone spur that pushes on the nerve. Radicular pain often goes away when you follow instructions from your health care provider for relieving pain at home. Follow these instructions at home: Managing pain  If directed, put ice on the affected area: ? Put ice in a plastic bag. ? Place a towel between your skin and the bag. ? Leave the ice on for 20 minutes, 2-3 times a day.  If directed, apply heat to the affected area as often as told by your health care provider. Use the heat source that your health care provider recommends, such as a moist heat pack or a heating pad. ? Place a towel between your skin and the heat source. ? Leave the heat on for 20-30 minutes. ? Remove the heat if your skin turns bright red. This is especially important if you are unable to feel pain, heat, or cold. You may have a greater risk of getting burned.      Activity  Do not sit or rest in bed for long periods of time.  Try to stay as active as possible. Ask your health care provider what type of exercise or activity is best for you.  Avoid activities that make your pain worse, such as bending and lifting.  Do not lift  anything that is heavier than 10 lb (4.5 kg), or the limit that you are told, until your health care provider says that it is safe.  Practice using proper technique when lifting items. Proper lifting technique involves bending your knees and rising up.  Do strength and range-of-motion exercises only as told by your health care provider or physical therapist.   General instructions  Take over-the-counter and prescription medicines only as told by your health care provider.  Pay attention to any changes in your symptoms.  Keep all follow-up visits as told by your health care provider. This is important. ? Your health care provider may send you to a physical  therapist to help with this pain. Contact a health care provider if:  Your pain and other symptoms get worse.  Your pain medicine is not helping.  Your pain has not improved after a few weeks of home care.  You have a fever. Get help right away if:  You have severe pain, weakness, or numbness.  You have difficulty with bladder or bowel control. Summary  Radicular pain is a type of pain that spreads from your back or neck along a spinal nerve.  When you have radicular pain, you may also have weakness, numbness, or tingling in the area of your body that is supplied by the nerve.  The pain may feel sharp or burning.  Radicular pain may be treated with ice, heat, medicines, or physical therapy. This information is not intended to replace advice given to you by your health care provider. Make sure you discuss any questions you have with your health care provider. Document Revised: 12/02/2017 Document Reviewed: 12/02/2017 Elsevier Patient Education  2021 [place].    Neuropathic Pain Neuropathic pain is pain caused by damage to the nerves that are responsible for certain sensations in your body (sensory nerves). The pain can be caused by:  Damage to the sensory nerves that send signals to your spinal cord and brain (peripheral nervous system).  Damage to the sensory nerves in your brain or spinal cord (central nervous system). Neuropathic pain can make you more sensitive to pain. Even a minor sensation can feel very painful. This is usually a long-term condition that can be difficult to treat. The type of pain differs from person to person. It may:  Start suddenly (acute), or it may develop slowly and last for a long time (chronic).  Come and go as damaged nerves heal, or it may stay at the same level for years.  Cause emotional distress, loss of sleep, and a lower quality of life. What are the causes? The most common cause of this condition is diabetes. Many other diseases  and conditions can also cause neuropathic pain. Causes of neuropathic pain can be classified as:  Toxic. This is caused by medicines and chemicals. The most common cause of toxic neuropathic pain is damage from cancer treatments (chemotherapy).  Metabolic. This can be caused by: ? Diabetes. This is the most common disease that damages the nerves. ? Lack of vitamin B from long-term alcohol abuse.  Traumatic. Any injury that cuts, crushes, or stretches a nerve can cause damage and pain. A common example is feeling pain after losing an arm or leg (phantom limb pain).  Compression-related. If a sensory nerve gets trapped or compressed for a long period of time, the blood supply to the nerve can be cut off.  Vascular. Many blood vessel diseases can cause neuropathic pain by decreasing blood supply and oxygen to  nerves.  Autoimmune. This type of pain results from diseases in which the body's defense system (immune system) mistakenly attacks sensory nerves. Examples of autoimmune diseases that can cause neuropathic pain include lupus and multiple sclerosis.  Infectious. Many types of viral infections can damage sensory nerves and cause pain. Shingles infection is a common cause of this type of pain.  Inherited. Neuropathic pain can be a symptom of many diseases that are passed down through families (genetic). What increases the risk? You are more likely to develop this condition if:  You have diabetes.  You smoke.  You drink too much alcohol.  You are taking certain medicines, including medicines that kill cancer cells (chemotherapy) or that treat immune system disorders. What are the signs or symptoms? The main symptom is pain. Neuropathic pain is often described as:  Burning.  Shock-like.  Stinging.  Hot or cold.  Itching. How is this diagnosed? No single test can diagnose neuropathic pain. It is diagnosed based on:  Physical exam and your symptoms. Your health care provider  will ask you about your pain. You may be asked to use a pain scale to describe how bad your pain is.  Tests. These may be done to see if you have a high sensitivity to pain and to help find the cause and location of any sensory nerve damage. They include: ? Nerve conduction studies to test how well nerve signals travel through your sensory nerves (electrodiagnostic testing). ? Stimulating your sensory nerves through electrodes on your skin and measuring the response in your spinal cord and brain (somatosensory evoked potential).  Imaging studies, such as: ? X-rays. ? CT scan. ? MRI. How is this treated? Treatment for neuropathic pain may change over time. You may need to try different treatment options or a combination of treatments. Some options include:  Treating the underlying cause of the neuropathy, such as diabetes, kidney disease, or vitamin deficiencies.  Stopping medicines that can cause neuropathy, such as chemotherapy.  Medicine to relieve pain. Medicines may include: ? Prescription or over-the-counter pain medicine. ? Anti-seizure medicine. ? Antidepressant medicines. ? Pain-relieving patches that are applied to painful areas of skin. ? A medicine to numb the area (local anesthetic), which can be injected as a nerve block.  Transcutaneous nerve stimulation. This uses electrical currents to block painful nerve signals. The treatment is painless.  Alternative treatments, such as: ? Acupuncture. ? Meditation. ? Massage. ? Physical therapy. ? Pain management programs. ? Counseling. Follow these instructions at home: Medicines  Take over-the-counter and prescription medicines only as told by your health care provider.  Do not drive or use heavy machinery while taking prescription pain medicine.  If you are taking prescription pain medicine, take actions to prevent or treat constipation. Your health care provider may recommend that you: ? Drink enough fluid to keep your  urine pale yellow. ? Eat foods that are high in fiber, such as fresh fruits and vegetables, whole grains, and beans. ? Limit foods that are high in fat and processed sugars, such as fried or sweet foods. ? Take an over-the-counter or prescription medicine for constipation.   Lifestyle  Have a good support system at home.  Consider joining a chronic pain support group.  Do not use any products that contain nicotine or tobacco, such as cigarettes and e-cigarettes. If you need help quitting, ask your health care provider.  Do not drink alcohol.   General instructions  Learn as much as you can about your condition.  Work closely with all your health care providers to find the treatment plan that works best for you.  Ask your health care provider what activities are safe for you.  Keep all follow-up visits as told by your health care provider. This is important. Contact a health care provider if:  Your pain treatments are not working.  You are having side effects from your medicines.  You are struggling with tiredness (fatigue), mood changes, depression, or anxiety. Summary  Neuropathic pain is pain caused by damage to the nerves that are responsible for certain sensations in your body (sensory nerves).  Neuropathic pain may come and go as damaged nerves heal, or it may stay at the same level for years.  Neuropathic pain is usually a long-term condition that can be difficult to treat. Consider joining a chronic pain support group. This information is not intended to replace advice given to you by your health care provider. Make sure you discuss any questions you have with your health care provider. Document Revised: 09/10/2018 Document Reviewed: 06/06/2017 Elsevier Patient Education  2021 [employer].

## 2020-06-28 NOTE — Progress Notes (Signed)
06/28/2020 Felicia [name] [id number] Apr 29, 1941   Chief Complaint: Constipation   History of Present Illness: Felicia [name] is a 80 year old female with a past medical history of hypertension, rheumatoid arthritis, neuropathy, sleep apnea uses CPAP, GERD and colon polyps. She presents to our office today for further evaluation regarding constipation. She is passing a soft BM daily, doesn't feel emptied. She eats a high fiber diet. She takes Benefiber 1 tablespoon daily. She is taking Miralax Q HS. Stools are soft and a bit messy when wiping. She reported having an xray done by her pain management specialist 05/23/2020 which showed a "dark" colon. I will request a copy of this xray for further review, suspect stool seen in the colon. She receives Cortisone injections per pain management for left hip and back pain. She has a decreased appetite. No abdominal pain but intermittent bloat at times. Her appetite is somewhat decreased. She is reducing her sweet intake to avoid weight gain. No fever, sweats or chills. Her most recent colonoscopy was 06/14/2015 which identified severe diverticulosis throughout the colon otherwise was normal and no further screening colonoscopies were recommended Dr. Carlean Purl. She has a history of GERD which is well controlled on Esomeprazole 40 mg twice daily.  CBC Latest Ref Rng & Units 04/10/2020 01/17/2020 10/18/2019  WBC 3.8 - 10.8 Thousand/uL 10.2 9.1 9.0  Hemoglobin 11.7 - 15.5 g/dL 12.7 11.5(L) 11.6(L)  Hematocrit 35.0 - 45.0 % 38.6 35.3 34.8(L)  Platelets 140 - 400 Thousand/uL 431(H) 332 378    CMP Latest Ref Rng & Units 04/10/2020 01/17/2020 11/18/2019  Glucose 65 - 139 mg/dL 91 74 97  BUN 7 - 25 mg/dL 15 14 16   Creatinine 0.60 - 0.93 mg/dL 0.81 0.84 0.73  Sodium 135 - 146 mmol/L 130(L) 134(L) 130(L)  Potassium 3.5 - 5.3 mmol/L 5.2 4.6 4.7  Chloride 98 - 110 mmol/L 96(L) 100 100  CO2 20 - 32 mmol/L 24 27 25   Calcium 8.6 - 10.4 mg/dL 9.9 9.7 9.3  Total Protein 6.1  - 8.1 g/dL 6.5 6.3 6.9  Total Bilirubin 0.2 - 1.2 mg/dL 0.5 0.4 0.3  Alkaline Phos 39 - 117 U/L - - 74  AST 10 - 35 U/L 16 19 19   ALT 6 - 29 U/L 12 13 17    Current Outpatient Medications on File Prior to Visit  Medication Sig Dispense Refill  . Apoaequorin (PREVAGEN PO) Take by mouth daily.    . Calcium Carbonate-Vitamin D 600-400 MG-UNIT tablet Take 2 tablets by mouth daily.     . Carboxymethylcellulose Sodium (REFRESH OP) Apply 1 drop to eye daily as needed (FOR DRY EYES).    . Cholecalciferol (VITAMIN D3) 1000 UNITS CAPS Take 1 capsule by mouth every other day.    . Cyanocobalamin (VITAMIN B-12) 2500 MCG SUBL Place 1 each under the tongue daily.    . diazepam (VALIUM) 5 MG tablet Take 1 by mouth 1 hour  pre-procedure with very light food. May bring 2nd tablet to appointment. 2 tablet 0  . diclofenac Sodium (VOLTAREN) 1 % GEL Apply 2 g topically 3 (three) times daily.    Marland Kitchen esomeprazole (NEXIUM) 40 MG capsule TAKE ONE CAPSULE BY MOUTH TWICE DAILY BEFORE A MEAL (Patient taking differently: Take 40 mg by mouth 2 (two) times daily before a meal.) 180 capsule 3  . estradiol (ESTRACE) 1 MG tablet Take 1 mg by mouth daily.  0  . folic acid (FOLVITE) 1 MG tablet TAKE 2 TABLETS(2 MG)  BY MOUTH DAILY (Patient taking differently: Take 2 mg by mouth daily.) 180 tablet 3  . gabapentin (NEURONTIN) 100 MG capsule Take 100 mg in morning, 300 mg at night 360 capsule 4  . Krill Oil Omega-3 300 MG CAPS Take 1 capsule by mouth daily.    Marland Kitchen latanoprost (XALATAN) 0.005 % ophthalmic solution Place 1 drop into the right eye at bedtime.    Marland Kitchen losartan (COZAAR) 100 MG tablet Take 100 mg by mouth daily.    . methotrexate 50 MG/2ML injection ADMINISTER 0.8 ML(20 MG) UNDER THE SKIN 1 TIME A WEEK (Patient taking differently: Inject 20 mg into the skin once a week. ADMINISTER 0.8 ML(20 MG) UNDER THE SKIN 1 TIME A WEEK) 10 mL 0  . metoprolol (LOPRESSOR) 50 MG tablet Take 50 mg by mouth daily.   0  . metroNIDAZOLE (METROGEL)  0.75 % gel Apply 1 application topically 2 (two) times daily.     . MULTIPLE VITAMIN PO Take 1 tablet by mouth daily.    Marland Kitchen ORENCIA 125 MG/ML SOSY INJECT 125MG  SUBCUTANEOUSLY WEEKLY 12 mL 0  . Polyethylene Glycol 3350 (MIRALAX PO) Take 1 Dose by mouth every evening.    . pravastatin (PRAVACHOL) 20 MG tablet Take 1 tablet (20 mg total) by mouth every evening. 30 tablet 11  . progesterone (PROMETRIUM) 200 MG capsule Take 200 mg by mouth daily.    . timolol (TIMOPTIC) 0.5 % ophthalmic solution Place 1 drop into both eyes daily.     . TUBERCULIN SYR 1CC/27GX1/2" (B-D TB SYRINGE 1CC/27GX1/2") 27G X 1/2" 1 ML MISC USE WITH METHOTREXATE 12 each 2  . valACYclovir (VALTREX) 1000 MG tablet Take 1,000 mg by mouth as needed (fever blister). Reported on 06/14/2015    . Wheat Dextrin (BENEFIBER DRINK MIX PO) Take at bedtime by mouth.     No current facility-administered medications on file prior to visit.   Allergies  Allergen Reactions  . Sulfonamide Derivatives     Rash, redness  . Amlodipine Swelling  . Norvasc [Amlodipine Besylate]     edema     Current Medications, Allergies, Past Medical History, Past Surgical History, Family History and Social History were reviewed in [employer] record.   Review of Systems:   Constitutional: Negative for fever, sweats, chills or weight loss.  Respiratory: Negative for shortness of breath.   Cardiovascular: Negative for chest pain, palpitations and leg swelling.  Gastrointestinal: See HPI.  Musculoskeletal: Negative for back pain or muscle aches.  Neurological: Negative for dizziness, headaches or paresthesias.    Physical Exam: Ht 5\' 5"  (1.651 m)   Wt 134 lb 3.2 oz (60.9 kg)   BMI 22.33 kg/m    Wt Readings from Last 3 Encounters:  06/28/20 134 lb 3.2 oz (60.9 kg)  06/28/20 132 lb (59.9 kg)  05/23/20 134 lb 12.8 oz (61.1 kg)  General: Well developed 80 year old female in no acute distress. Head: Normocephalic and  atraumatic. Eyes: No scleral icterus. Conjunctiva pink . Ears: Normal auditory acuity. Mouth: Dentition intact. No ulcers or lesions.  Lungs: Clear throughout to auscultation. Heart: Regular rate and rhythm, no murmur. Abdomen: Soft, nontender and nondistended. No masses or hepatomegaly. Normal bowel sounds x 4 quadrants.  Rectal: Furred. Musculoskeletal: Symmetrical with no gross deformities. Extremities: No edema. Neurological: Alert oriented x 4. No focal deficits.  Psychological: Alert and cooperative. Normal mood and affect  Assessment and Recommendations: 1. 80 year old female with constipation, stools are soft and requires a moderate amount of wiping  to feel clean. -Increase Benefiber 1 tablespoon twice daily if tolerated, reduce back to once daily if abdominal bloat worsen -Decrease MiraLAX to one half capful in 8 ounces of water -If no improvement may consider trial with Linzess -Okay to continue current probiotic or switch to a different one (patient prefers to continue a probiotic) -Request copy of x-ray from her pain management specialist -Patient to call me in 4 weeks with an update -Patient to call our office if her symptoms worsen  2. GERD, well controlled on Esomeprazole  3. History of hyponatremia  -Continue follow-up with PCP

## 2020-07-02 ENCOUNTER — Other Ambulatory Visit: Payer: Medicare PPO

## 2020-07-10 ENCOUNTER — Other Ambulatory Visit: Payer: Self-pay | Admitting: *Deleted

## 2020-07-10 DIAGNOSIS — Z79899 Other long term (current) drug therapy: Secondary | ICD-10-CM | POA: Diagnosis not present

## 2020-07-10 NOTE — Progress Notes (Signed)
I reviewed note and agree with plan.   Penni Bombard, MD 11/07/3417, 3:79 PM Certified in Neurology, Neurophysiology and Neuroimaging  [facility] [address] [place], [place] [address] [phone]

## 2020-07-11 LAB — CBC WITH DIFFERENTIAL/PLATELET
Absolute Monocytes: 703 cells/uL (ref 200–950)
Basophils Absolute: 29 cells/uL (ref 0–200)
Basophils Relative: 0.3 %
Eosinophils Absolute: 76 cells/uL (ref 15–500)
Eosinophils Relative: 0.8 %
HCT: 35.8 % (ref 35.0–45.0)
Hemoglobin: 12.1 g/dL (ref 11.7–15.5)
Lymphs Abs: 1729 cells/uL (ref 850–3900)
MCH: 32.1 pg (ref 27.0–33.0)
MCHC: 33.8 g/dL (ref 32.0–36.0)
MCV: 95 fL (ref 80.0–100.0)
MPV: 9.7 fL (ref 7.5–12.5)
Monocytes Relative: 7.4 %
Neutro Abs: 6964 cells/uL (ref 1500–7800)
Neutrophils Relative %: 73.3 %
Platelets: 388 10*3/uL (ref 140–400)
RBC: 3.77 10*6/uL — ABNORMAL LOW (ref 3.80–5.10)
RDW: 13.2 % (ref 11.0–15.0)
Total Lymphocyte: 18.2 %
WBC: 9.5 10*3/uL (ref 3.8–10.8)

## 2020-07-11 LAB — COMPLETE METABOLIC PANEL WITH GFR
AG Ratio: 1.8 (calc) (ref 1.0–2.5)
ALT: 15 U/L (ref 6–29)
AST: 21 U/L (ref 10–35)
Albumin: 3.9 g/dL (ref 3.6–5.1)
Alkaline phosphatase (APISO): 70 U/L (ref 37–153)
BUN: 15 mg/dL (ref 7–25)
CO2: 27 mmol/L (ref 20–32)
Calcium: 9.5 mg/dL (ref 8.6–10.4)
Chloride: 99 mmol/L (ref 98–110)
Creat: 0.66 mg/dL (ref 0.60–0.93)
GFR, Est African American: 98 mL/min/{1.73_m2} (ref >=60)
GFR, Est Non African American: 85 mL/min/{1.73_m2} (ref >=60)
Globulin: 2.2 g/dL (calc) (ref 1.9–3.7)
Glucose, Bld: 98 mg/dL (ref 65–99)
Potassium: 4.6 mmol/L (ref 3.5–5.3)
Sodium: 132 mmol/L — ABNORMAL LOW (ref 135–146)
Total Bilirubin: 0.4 mg/dL (ref 0.2–1.2)
Total Protein: 6.1 g/dL (ref 6.1–8.1)

## 2020-07-18 ENCOUNTER — Ambulatory Visit
Admission: RE | Admit: 2020-07-18 | Discharge: 2020-07-18 | Disposition: A | Discharge status: Home / Self Care | Payer: Medicare PPO | Source: Ambulatory Visit | Attending: Physical Medicine and Rehabilitation | Admitting: Physical Medicine and Rehabilitation

## 2020-07-18 ENCOUNTER — Other Ambulatory Visit: Payer: Self-pay

## 2020-07-18 DIAGNOSIS — M545 Low back pain, unspecified: Secondary | ICD-10-CM | POA: Diagnosis not present

## 2020-07-19 ENCOUNTER — Telehealth: Payer: Self-pay | Admitting: Family Medicine

## 2020-07-19 ENCOUNTER — Telehealth: Payer: Self-pay | Admitting: Physical Medicine and Rehabilitation

## 2020-07-19 NOTE — Telephone Encounter (Signed)
Please let her know that Dr Ernestina Patches will call her with specific details but I do see in the impression that there are some degenerative and arthritic changes, scoliosis that she knew about and potential compression of nerves of the left side. Have her discuss with Dr Ernestina Patches to see if this is thought to be source of pain. We have had great experiences with Dr Ernestina Patches and I feel confident that he will be able to help direct her with pain management options. Have her call us if needed.

## 2020-07-19 NOTE — Telephone Encounter (Signed)
Pt has called asking that Amy,NP be made aware the results to her MRI are in mychart and if there are questions or concerns for pt to please call her

## 2020-07-19 NOTE — Telephone Encounter (Signed)
-----   Message from Magnus Sinning, MD sent at 07/19/2020  8:20 AM EST ----- Regarding: MRI She has disc herniation likely impacting Left L4 and L5 nerve roots, this fits with sxs. We did L5-S1 interlam, could do Left L4 and L5 tf esi and or refer for surgical consult.

## 2020-07-19 NOTE — Telephone Encounter (Signed)
Needs auth for left L4 and L5 TF. Scheduled for 2/28.

## 2020-07-24 DIAGNOSIS — H409 Unspecified glaucoma: Secondary | ICD-10-CM | POA: Diagnosis not present

## 2020-07-24 DIAGNOSIS — H401233 Low-tension glaucoma, bilateral, severe stage: Secondary | ICD-10-CM | POA: Diagnosis not present

## 2020-07-24 NOTE — Telephone Encounter (Signed)
Pt was approve Authorization #[id number]

## 2020-07-25 ENCOUNTER — Telehealth: Payer: Self-pay | Admitting: Internal Medicine

## 2020-07-25 NOTE — Telephone Encounter (Signed)
See below

## 2020-07-25 NOTE — Telephone Encounter (Signed)
PT advised that she is maintaining 50-60 ounces fluid and her sodium levels are above 30. So as advised by Dr Cruzita Lederer she is calling to to cancel her 08/17/2020 appointment

## 2020-07-26 NOTE — Telephone Encounter (Signed)
This is a [place] pt. C

## 2020-07-27 DIAGNOSIS — Z01419 Encounter for gynecological examination (general) (routine) without abnormal findings: Secondary | ICD-10-CM | POA: Diagnosis not present

## 2020-07-31 ENCOUNTER — Ambulatory Visit (INDEPENDENT_AMBULATORY_CARE_PROVIDER_SITE_OTHER): Payer: Medicare PPO | Admitting: Physical Medicine and Rehabilitation

## 2020-07-31 ENCOUNTER — Encounter: Payer: Self-pay | Admitting: Physical Medicine and Rehabilitation

## 2020-07-31 ENCOUNTER — Ambulatory Visit: Payer: Self-pay

## 2020-07-31 ENCOUNTER — Other Ambulatory Visit: Payer: Self-pay

## 2020-07-31 VITALS — BP 133/74 | HR 78

## 2020-07-31 DIAGNOSIS — M5416 Radiculopathy, lumbar region: Secondary | ICD-10-CM | POA: Diagnosis not present

## 2020-07-31 DIAGNOSIS — M5116 Intervertebral disc disorders with radiculopathy, lumbar region: Secondary | ICD-10-CM

## 2020-07-31 MED ORDER — METHYLPREDNISOLONE ACETATE 80 MG/ML IJ SUSP
80.0000 mg | Freq: Once | INTRAMUSCULAR | Status: AC
  Administered 2020-07-31: 80 mg

## 2020-07-31 NOTE — Progress Notes (Signed)
Pt state lower back pain that travels to her left hip. Pt state when sitting for a few minutes pt feels pain when she tries to get up. Pt state she tries to walk around and uses heating pads to help ease the pain. Pt has hx of nj on 06/15/20 pt state it helped and it comes and goes now.  Numeric Pain Rating Scale and Functional Assessment Average Pain 6   In the last MONTH (on 0-10 scale) has pain interfered with the following?  1. General activity like being  able to carry out your everyday physical activities such as walking, climbing stairs, carrying groceries, or moving a chair?  Rating(6)   +Driver, -BT, -Dye Allergies.

## 2020-07-31 NOTE — Patient Instructions (Signed)

## 2020-08-01 ENCOUNTER — Other Ambulatory Visit: Payer: Self-pay

## 2020-08-01 DIAGNOSIS — Z1231 Encounter for screening mammogram for malignant neoplasm of breast: Secondary | ICD-10-CM | POA: Diagnosis not present

## 2020-08-01 MED ORDER — PRAVASTATIN SODIUM 20 MG PO TABS: 20.0000 mg | ORAL_TABLET | Freq: Every evening | ORAL | 0 refills | Status: DC

## 2020-08-06 ENCOUNTER — Other Ambulatory Visit: Payer: Self-pay | Admitting: Internal Medicine

## 2020-08-11 ENCOUNTER — Ambulatory Visit (INDEPENDENT_AMBULATORY_CARE_PROVIDER_SITE_OTHER): Payer: Medicare PPO | Admitting: Rehabilitative and Restorative Service Providers"

## 2020-08-11 ENCOUNTER — Encounter: Payer: Self-pay | Admitting: Rehabilitative and Restorative Service Providers"

## 2020-08-11 ENCOUNTER — Other Ambulatory Visit: Payer: Self-pay

## 2020-08-11 DIAGNOSIS — M6281 Muscle weakness (generalized): Secondary | ICD-10-CM

## 2020-08-11 DIAGNOSIS — M5416 Radiculopathy, lumbar region: Secondary | ICD-10-CM

## 2020-08-11 DIAGNOSIS — R293 Abnormal posture: Secondary | ICD-10-CM

## 2020-08-11 DIAGNOSIS — R262 Difficulty in walking, not elsewhere classified: Secondary | ICD-10-CM | POA: Diagnosis not present

## 2020-08-11 NOTE — Patient Instructions (Signed)
Access Code: [email or web address] URL: [email or web address] Date: 08/11/2020 Prepared by: Vista Mink  Exercises Standing Scapular Retraction - 5 x daily - 7 x weekly - 1 sets - 5 reps - 5 second hold Standing Lumbar Extension at [place] - 5 x daily - 7 x weekly - 1 sets - 5 reps - 3 seconds hold Supine Figure 4 Piriformis Stretch - 2-3 x daily - 7 x weekly - 1 sets - 5 reps - 20 seconds hold

## 2020-08-11 NOTE — Therapy (Signed)
[facility] [address] [place], [place], [address] Phone: [phone]   Fax:  [phone]  Physical Therapy Evaluation  Patient Details  Name: Felicia [name] MRN: [id number] Date of Birth: [birth year] Referring Provider (PT): Magnus Sinning MD  Referring diagnosis? M54.16  M51.16 Treatment diagnosis? (if different than referring diagnosis) R26.2  R29.3  M62.81  M54.16 What was this (referring dx) caused by? []  Surgery []  Fall [x]  Ongoing issue [x]  Arthritis []  Other: ____________  Laterality: []  Rt [x]  Lt []  Both  Check all possible CPT codes:      [x]  97110 (Therapeutic Exercise)  []  92507 (SLP Treatment)  [x]  97112 (Neuro Re-ed)   []  92526 (Swallowing Treatment)   [x]  97116 (Gait Training)   []  [phone] (Cognitive Training, 1st 15 minutes) [x]  97140 (Manual Therapy)   []  97130 (Cognitive Training, each add'l 15 minutes)  [x]  97530 (Therapeutic Activities)  []  Other, List CPT Code ____________    [x]  13244 (Self Care)       [x]  All codes above (97110 - 97535)  [x]  97012 (Mechanical Traction)  [x]  97014 (E-stim Unattended)  [x]  01027 (E-stim manual)  []  97033 (Ionto)  []  97035 (Ultrasound)  []  97760 (Orthotic Fit) [x]  97750 (Physical Performance Training) []  [id number] (Aquatic Therapy) []  97034 (Contrast Bath) []  [id number] (Paraffin) []  97597 (Wound Care 1st 20 sq cm) []  97598 (Wound Care each add'l 20 sq cm) []  97016 (Vasopneumatic Device) []  [id number] Comptroller) []  [id number] (Prosthetic Training)  Encounter Date: 08/11/2020   PT End of Session - 08/11/20 1500    Visit Number 1    Number of Visits 16    Date for PT Re-Evaluation 09/29/20    PT Start Time 0930    PT Stop Time 1015    PT Time Calculation (min) 45 min    Activity Tolerance Patient tolerated treatment well;No increased pain    Behavior During Therapy WFL for tasks assessed/performed           Past Medical History:  Diagnosis Date  . Adenomatous colon polyp 2004    Colo in 2009 "no polyps"  . Complication of anesthesia    small airway per patient  . GERD (gastroesophageal reflux disease)   . Glaucoma   . Hiatal hernia   . Hypertension   . Neuropathy   . Rheumatoid arthritis(714.0)    [place]; Methotrexate (Dr. Patrecia Pour)  . Sleep apnea    on CPAP    Past Surgical History:  Procedure Laterality Date  . CARDIAC CATHETERIZATION N/A 03/29/2015   Procedure: Left Heart Cath and Coronary Angiography;  Surgeon: Belva Crome, MD;  Location: [facility];  Service: Cardiovascular;  Laterality: N/A;  . CATARACT EXTRACTION, BILATERAL    . COLONOSCOPY W/ POLYPECTOMY     X1; negative subsequently  . DILATION AND CURETTAGE OF UTERUS    . EYE SURGERY     due to RA  . EYE SURGERY Left 06/2017   eyelid tendon   . HYSTEROSCOPY WITH D & C  06/24/2012   Procedure: DILATATION AND CURETTAGE /HYSTEROSCOPY;  Surgeon: [name], MD;  Location: [facility];  Service: Gynecology;  Laterality: N/A;  . SHOULDER SURGERY Left    due to RA  . TRABECULECTOMY     X 2 OD; X 1 OS  . UPPER GASTROINTESTINAL ENDOSCOPY     hiatal hernia    There were no vitals filed for this visit.    Subjective Assessment - 08/11/20 1451  Subjective Felicia [name] has had L LE radiculopathy for "about 6 months."  Symptoms are worst with sitting.  She would like to be more active and wants to make sure she is not doing anything to make her situation worse.    Pertinent History RA    Limitations Sitting    How long can you sit comfortably? < 30 minutes    How long can you walk comfortably? Has not been walking for exercise    Patient Stated Goals Return to walking for exercise without L leg or low back pain    Currently in Pain? Yes    Pain Score 5     Pain Location Leg    Pain Orientation Left    Pain Descriptors / Indicators Burning;Pins and needles;Sore;Radiating    Pain Type Chronic pain    Pain Radiating Towards L foot    Pain Onset More than a month ago    Pain Frequency  Intermittent    Aggravating Factors  Prolonged sitting and with prolonged rest/postures    Effect of Pain on Daily Activities Unable to walk for exercise.  Increasing L LE radicular symptoms with prolonged sitting, sleeping, etc.    Multiple Pain Sites No              [facility] PT Assessment - 08/11/20 0001      Assessment   Medical Diagnosis L Lumbar radiculopathy    Referring Provider (PT) Magnus Sinning MD    Onset Date/Surgical Date --   6 months     Balance Screen   Has the patient fallen in the past 6 months No    Has the patient had a decrease in activity level because of a fear of falling?  No    Is the patient reluctant to leave their home because of a fear of falling?  No      Prior Function   Level of Independence Independent    Vocation Retired    Leisure Walking      Cognition   Overall Cognitive Status Within Functional Limits for tasks assessed      Posture/Postural Control   Posture/Postural Control Postural limitations    Postural Limitations Forward head;Rounded Shoulders;Decreased lumbar lordosis      ROM / Strength   AROM / PROM / Strength AROM      AROM   Overall AROM  Deficits    AROM Assessment Site Lumbar;Hip    Right/Left Hip Left;Right    Right Hip Flexion 110    Right Hip External Rotation  35    Right Hip Internal Rotation  10    Left Hip Flexion 110    Left Hip External Rotation  30    Left Hip Internal Rotation  20    Lumbar Extension 10      Flexibility   Soft Tissue Assessment /Muscle Length yes    Hamstrings 45 degrees L/45 degrees R                      Objective measurements completed on examination: See above findings.       [facility] Adult PT Treatment/Exercise - 08/11/20 0001      Therapeutic Activites    Therapeutic Activities Other Therapeutic Activities    Other Therapeutic Activities Spine anatomy, imaging review, log roll, basic posture and body mechanics eduaction      Exercises   Exercises Lumbar       Lumbar Exercises: Stretches   Figure 4 Stretch 5  reps;20 seconds;With overpressure      Lumbar Exercises: Standing   Other Standing Lumbar Exercises Trunk extension AROM 10X 3 seconds    Other Standing Lumbar Exercises Shoulder blade pinches 10X 5 seconds                  PT Education - 08/11/20 1458    Education Details Discussed spine anatomy, imaging, log roll, posture and beginner body mechanics, exam findings and started beginner HEP    Person(s) Educated Patient    Methods Explanation;Demonstration;Tactile cues;Verbal cues;Handout    Comprehension Verbal cues required;Returned demonstration;Need further instruction;Tactile cues required;Verbalized understanding            PT Short Term Goals - 08/11/20 1508      PT SHORT TERM GOAL #1   Title Emeline will report no L LE radicular symptoms below the knee in > 1 week.    Baseline Symptoms below the knee.    Time 4    Period Weeks    Status New    Target Date 09/08/20      PT SHORT TERM GOAL #3   Target Date 09/02/18             PT Long Term Goals - 08/11/20 1510      PT LONG TERM GOAL #1   Title Coriana will score 55 on the FOTO Functional Assessment at DC.    Baseline 42    Time 8    Period Weeks    Status New    Target Date 10/06/20      PT LONG TERM GOAL #2   Title Shonda will report no L LE symptoms for > 1 week.    Baseline L LE symptoms to foot.    Time 8    Period Weeks    Status New    Target Date 10/06/20      PT LONG TERM GOAL #3   Title Jalasia will be able to walk a mile without low back pain or radicular pain at DC.    Baseline Not walking for exercise and L LE radicular symptoms to the foot.    Time 8    Period Weeks    Status New    Target Date 10/06/20      PT LONG TERM GOAL #4   Title Britiney will be independent with her long-term HEP at DC.    Time 8    Period Weeks    Status New    Target Date 10/06/20                  Plan - 08/11/20 1501    Clinical Impression Statement  Lynlee has had L leg radicular pain and paresthesias for "about 6 months."  She would like to get back to being healthy including walking for exercise and she wants to make sure she is not going to do anything she shouldn't.  We discussed the importance of avoiding bending and twisting, flexed postures and prolonged postures (particularly sitting).  We reviewed spine anatomy, the log roll technique for bed mobility and started her HEP with emphasis on trunk extension AROM and postural strengthening.  Her prognosis is good to meet long-term goals.    Personal Factors and Comorbidities Comorbidity 2    Comorbidities Neuropathy and RA    Examination-Activity Limitations Sit;Sleep;Bed Mobility;Bend;Lift    Examination-Participation Restrictions Community Activity    Stability/Clinical Decision Making Stable/Uncomplicated    Clinical Decision Making Low    Rehab Potential  Good    PT Frequency 2x / week    PT Duration 8 weeks    PT Treatment/Interventions ADLs/Self Care Home Management;Cryotherapy;Electrical Stimulation;Moist Heat;Traction;Therapeutic exercise;Balance training;Therapeutic activities;Manual techniques;Patient/family education;Neuromuscular re-education;Dry needling    PT Next Visit Plan Body mechanics and postural education, leg strengthening, balance and continued postural and spine strength work (careful with knee OA)    PT Home Exercise Plan Access Code: [email or web address]    Consulted and Agree with Plan of Care Patient           Patient will benefit from skilled therapeutic intervention in order to improve the following deficits and impairments:  Decreased endurance,Decreased activity tolerance,Pain,Postural dysfunction,Improper body mechanics,Decreased strength,Decreased balance,Decreased range of motion,Difficulty walking,Impaired flexibility  Visit Diagnosis: Difficulty walking  Abnormal posture  Muscle weakness (generalized)  Radiculopathy, lumbar region     Problem  List Patient Active Problem List   Diagnosis Date Noted  . Neuropathy 06/16/2020  . SIADH (syndrome of inappropriate ADH production) ([place]) 02/18/2020  . Hyponatremia 11/18/2019  . Thrombocytosis 08/01/2018  . URI (upper respiratory infection) 04/14/2017  . Primary osteoarthritis of both knees 03/06/2017  . Exertional chest pain   . Pain in the chest 03/27/2015  . Postural hypotension 05/26/2013  . OSA (obstructive sleep apnea) 03/22/2013  . Osteoarthritis 11/07/2011  . Abdominal bruit 10/16/2011  . HTN (hypertension) 08/13/2011  . Anisocoria 08/13/2011  . Cervical radiculopathy 07/17/2011  . DDD (degenerative disc disease), cervical 07/10/2011  . Glaucoma 06/06/2011  . HIATAL HERNIA 01/30/2010  . POSTMENOPAUSAL SYNDROME 12/14/2009  . EUSTACHIAN TUBE DYSFUNCTION, RIGHT 06/28/2009  . GERD 12/07/2008  . Rheumatoid arthritis ([place]) 12/07/2008  . COLONIC POLYPS, HX OF 12/07/2008  . Hyperlipidemia LDL goal <70 10/17/2006  . HYPERCALCEMIA 10/17/2006  . Mitral valve disease 10/17/2006    Farley Ly PT, MPT 08/11/2020, 3:15 PM  [facility] [address] [place], [place], [address] Phone: [phone]   Fax:  [phone]  Name: Felicia [name] MRN: [id number] Date of Birth: [birth year]

## 2020-08-16 ENCOUNTER — Other Ambulatory Visit: Payer: Self-pay | Admitting: Rheumatology

## 2020-08-16 NOTE — Telephone Encounter (Signed)
Next Visit: 08/22/2020  Last Visit: 05/23/2020  Last Fill: 05/25/2020  DX: Rheumatoid arthritis involving multiple sites with positive rheumatoid factor   Current Dose per office note 05/23/2020, methotrexate 0.8 mL every 7 days  Labs: 07/10/2020, RBC count borderline low. Rest of CBC WNL. Sodium is chronically low but stable. Rest of CMP WNL.  Okay to refill MTX?

## 2020-08-17 ENCOUNTER — Ambulatory Visit: Payer: Medicare PPO | Admitting: Internal Medicine

## 2020-08-21 NOTE — Progress Notes (Signed)
Office Visit Note  Patient: Felicia [name]             Date of Birth: 09-09-1940           MRN: [id number]             PCP: Kathyrn Lass, MD Referring: Kathyrn Lass, MD Visit Date: 08/22/2020 Occupation: @GUAROCC @  Subjective:  Pain in both hands  History of Present Illness: Felicia [name] is a 80 y.o. female with history of seropositive rheumatoid arthritis, osteoarthritis, DDD, and osteoporosis.  She is currently on Orencia 125 mg sq injections once weekly, Methotrexate 0.8 ml sq injections once weekly, and folic acid 2 mg by mouth daily.  She has not missed any doses of Orencia or methotrexate.  Going to the patient within the past 1 month she has been experiencing recurrent flares in both hands and both wrist joints.  She brought several pictures of the swelling and redness she has been experiencing in her hands.  She reports that she is also noticed a new onset tremor in the left hand.  She has not schedule an appointment with Dr. Leta Baptist yet.  She has also noticed increased fidgeting which has been mentioned by several of her friends as well.  She states that her lower back pain has improved since having an epidural performed on 07/31/2020 by Dr. Ernestina Patches.  She has ongoing stiffness in her neck and lower back.  She is currently going to physical therapy which is started to improve her symptoms as well.  She denies any recent falls or fractures.  She continues to take a calcium and vitamin D supplement daily.  She is due for her next Reclast infusion.     Activities of Daily Living:  Patient reports morning stiffness for several hours.   Patient Reports nocturnal pain.  Difficulty dressing/grooming: Denies Difficulty climbing stairs: Reports Difficulty getting out of chair: Reports Difficulty using hands for taps, buttons, cutlery, and/or writing: Reports  Review of Systems  Constitutional: Positive for fatigue.  HENT: Positive for mouth dryness. Negative for mouth sores and nose  dryness.   Eyes: Positive for dryness. Negative for pain and itching.  Respiratory: Negative for shortness of breath and difficulty breathing.   Cardiovascular: Negative for chest pain and palpitations.  Gastrointestinal: Positive for constipation. Negative for blood in stool and diarrhea.  Endocrine: Negative for increased urination.  Genitourinary: Negative for difficulty urinating.  Musculoskeletal: Positive for arthralgias, joint pain, joint swelling and morning stiffness. Negative for myalgias, muscle tenderness and myalgias.  Skin: Positive for redness. Negative for color change and rash.  Allergic/Immunologic: Negative for susceptible to infections.  Neurological: Positive for tremors, numbness and weakness. Negative for dizziness, headaches and memory loss.  Hematological: Positive for bruising/bleeding tendency.  Psychiatric/Behavioral: Negative for confusion.    PMFS History:  Patient Active Problem List   Diagnosis Date Noted  . Neuropathy 06/16/2020  . SIADH (syndrome of inappropriate ADH production) ([place]) 02/18/2020  . Hyponatremia 11/18/2019  . Thrombocytosis 08/01/2018  . URI (upper respiratory infection) 04/14/2017  . Primary osteoarthritis of both knees 03/06/2017  . Exertional chest pain   . Pain in the chest 03/27/2015  . Postural hypotension 05/26/2013  . OSA (obstructive sleep apnea) 03/22/2013  . Osteoarthritis 11/07/2011  . Abdominal bruit 10/16/2011  . HTN (hypertension) 08/13/2011  . Anisocoria 08/13/2011  . Cervical radiculopathy 07/17/2011  . DDD (degenerative disc disease), cervical 07/10/2011  . Glaucoma 06/06/2011  . HIATAL HERNIA 01/30/2010  . POSTMENOPAUSAL SYNDROME 12/14/2009  .  EUSTACHIAN TUBE DYSFUNCTION, RIGHT 06/28/2009  . GERD 12/07/2008  . Rheumatoid arthritis ([place]) 12/07/2008  . COLONIC POLYPS, HX OF 12/07/2008  . Hyperlipidemia LDL goal <70 10/17/2006  . HYPERCALCEMIA 10/17/2006  . Mitral valve disease 10/17/2006    Past Medical  History:  Diagnosis Date  . Adenomatous colon polyp 2004   Colo in 2009 "no polyps"  . Complication of anesthesia    small airway per patient  . GERD (gastroesophageal reflux disease)   . Glaucoma   . Hiatal hernia   . Hypertension   . Neuropathy   . Rheumatoid arthritis(714.0)    Orencia; Methotrexate (Dr. Patrecia Pour)  . Sleep apnea    on CPAP    Family History  Problem Relation Age of Onset  . Dementia Mother   . Lung disease Mother        bronchiectasis  . Heart disease Mother        Aortic Stenosis  . Osteoporosis Mother   . Heart attack Father 77       S/P CBAG  . Stroke Maternal Grandmother 83  . Ulcerative colitis Brother   . Diabetes Neg Hx   . Cancer Neg Hx   . Colon cancer Neg Hx   . Esophageal cancer Neg Hx   . Stomach cancer Neg Hx   . Pancreatic cancer Neg Hx   . Liver disease Neg Hx    Past Surgical History:  Procedure Laterality Date  . CARDIAC CATHETERIZATION N/A 03/29/2015   Procedure: Left Heart Cath and Coronary Angiography;  Surgeon: Belva Crome, MD;  Location: [facility];  Service: Cardiovascular;  Laterality: N/A;  . CATARACT EXTRACTION, BILATERAL    . COLONOSCOPY W/ POLYPECTOMY     X1; negative subsequently  . DILATION AND CURETTAGE OF UTERUS    . EYE SURGERY     due to RA  . EYE SURGERY Left 06/2017   eyelid tendon   . HYSTEROSCOPY WITH D & C  06/24/2012   Procedure: DILATATION AND CURETTAGE /HYSTEROSCOPY;  Surgeon: [name], MD;  Location: [facility];  Service: Gynecology;  Laterality: N/A;  . SHOULDER SURGERY Left    due to RA  . TRABECULECTOMY     X 2 OD; X 1 OS  . 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 Influenza Split 03/08/2014  . Influenza Whole 04/07/2007, 02/24/2008, 03/10/2009, 03/03/2012  . Influenza, High Dose  Seasonal PF 01/15/2016, 03/07/2017  . Influenza,inj,Quad PF,6+ Mos 03/15/2013, 02/21/2015, 03/17/2018  . PFIZER(Purple Top)SARS-COV-2 Vaccination 06/24/2019, 07/13/2019, 01/15/2020  . PPD Test 03/10/2012  . Pneumococcal Conjugate-13 09/12/2015  . Pneumococcal Polysaccharide-23 03/25/2003, 04/12/2008  . Td 12/14/2009  . Zoster Recombinat (Shingrix) 03/23/2018     Objective: Vital Signs: BP (!) 156/76 (BP Location: Left Arm, Patient Position: Sitting, Cuff Size: Normal)   Pulse 70   Resp 14   Ht 5\' 5"  (1.651 m)   Wt 137 lb 3.2 oz (62.2 kg)   BMI 22.83 kg/m    Physical Exam Vitals and nursing note reviewed.  Constitutional:      Appearance: She is well-developed.  HENT:     Head: Normocephalic and atraumatic.  Eyes:     Conjunctiva/sclera: Conjunctivae normal.  Pulmonary:     Effort: Pulmonary effort is normal.  Abdominal:  Palpations: Abdomen is soft.  Musculoskeletal:     Cervical back: Normal range of motion.  Skin:    General: Skin is warm and dry.     Capillary Refill: Capillary refill takes less than 2 seconds.  Neurological:     Mental Status: She is alert and oriented to person, place, and time.  Psychiatric:        Behavior: Behavior normal.      Musculoskeletal Exam: C-spine has slightly limited range of motion with lateral rotation.  Shoulder joints have good range of motion with no discomfort or tenderness.  Elbow joints have good range of motion with no tenderness or inflammation.  Tenderness and synovitis over the dorsal aspect of the left wrist.  Tenderness, erythema, and synovitis of the right second and third MCP joints.  Tenderness and synovial thickening over the right second, third, and fourth MCP joints.  Difficulty making a complete fist bilaterally.  Hip joints have good range of motion with no discomfort.  Knee joints have good range of motion with no warmth or effusion.  Ankle joints have good range of motion with no tenderness or  inflammation.  CDAI Exam: CDAI Score: 10.4  Patient Global: 8 mm; Provider Global: 6 mm Swollen: 3 ; Tender: 8  Joint Exam 08/22/2020      Right  Left  Wrist     Swollen Tender  MCP 2   Tender  Swollen Tender  MCP 3   Tender  Swollen Tender  MCP 4   Tender     Ankle   Tender   Tender     Investigation: No additional findings.  Imaging: XR C-ARM NO REPORT  Result Date: 07/31/2020 Please see Notes tab for imaging impression.   Recent Labs: Lab Results  Component Value Date   WBC 9.5 07/10/2020   HGB 12.1 07/10/2020   PLT 388 07/10/2020   NA 132 (L) 07/10/2020   K 4.6 07/10/2020   CL 99 07/10/2020   CO2 27 07/10/2020   GLUCOSE 98 07/10/2020   BUN 15 07/10/2020   CREATININE 0.66 07/10/2020   BILITOT 0.4 07/10/2020   ALKPHOS 74 11/18/2019   AST 21 07/10/2020   ALT 15 07/10/2020   PROT 6.1 07/10/2020   ALBUMIN 4.1 11/18/2019   CALCIUM 9.5 07/10/2020   GFRAA 98 07/10/2020   QFTBGOLDPLUS NEGATIVE 12/03/2019    Speciality Comments: TB gold neg 11/07/17 Reclast first infusion was given August 10, 2019  Procedures:  No procedures performed Allergies: Sulfonamide derivatives, Amlodipine, and Norvasc [amlodipine besylate]     Assessment / Plan:     Visit Diagnoses: Rheumatoid arthritis involving multiple sites with positive rheumatoid factor ([place]) - +RF, +ANA: She presents today with tenderness, erythema, and synovitis over the left second and third MCP joints.  She has been experiencing recurrent flares in both hands lasting several days at a time for the past 1 month.  She brought pictures with her today revealing synovitis and erythema over her MCP joints on several occasions. She is currently on Orencia 125 mg subcutaneous injection once weekly, methotrexate 0.8 mL sq injection once weekly, and folic acid 2 mg by mouth daily.  She has not missed any doses of these medications recently.  She has been unable to identify a trigger for the recurrent flares.  She does not  feel as though combination therapy has been managing her symptoms.  X-rays of both hands and feet were obtained on 12/21/2019 which did not reveal any radiographic progression when compared  to x-rays from 2018.  Different treatment options were discussed today in detail.  We will apply for Rinvoq 15 mg 1 tablet by mouth daily through her insurance.  Indications, contraindications, potential side effects of Rinvoq were discussed.  All questions were addressed and consent was obtained.  Advised to discuss Orencia (her last dose was today).  She will remain on methotrexate and folic acid as prescribed.  She will follow-up in the office in 6 to 8 weeks to assess her response to Rinvoq and methotrexate combination.  Association of heart disease with rheumatoid arthritis was discussed. Need to monitor blood pressure, cholesterol, and to exercise 30-60 minutes on daily basis was discussed.  She remain on pravastatin.   Discussed the black box warning associated with Jak inhibitors including increased risk for malignancy and MACE.  Counseled patient that Rinvoq is a JAK inhibitor indicated for Rheumatoid Arthritis.  Counseled patient on purpose, proper use, and adverse effects of Rinvoq.    Reviewed the most common adverse effects including infection, diarrhea, headaches.  Also reviewed rare adverse effects such as bowel injury and the need to contact us if they develop stomach pain during treatment. Counseled on the increase risk of venous thrombosis. Reviewed with patient that there is the possibility of an increased risk of malignancy but it is not well understood if this increased risk is due to the medication or the disease state.  Instructed patient that medication should be held for infection and prior to surgery.  Advised patient to avoid live vaccines. Recommend annual influenza, Pneumovax 23, Prevnar 13, and Shingrix as indicated.   Reviewed importance of routine lab monitoring including lipid panel.   Standing orders placed. Provided patient with medication education material and answered all questions.  Patient consented to Rinvoq.  Will upload into patient's chart.  Will apply through patient's insurance and update when we receive a response.    Patient dose will be 15 mg daily.  Prescription will be sent to pharmacy pending lab results and insurance approval.   High risk medication use -Applying for rinvoq 15 mg 1 tablet by mouth daily.  She will continue on methotrexate 0.8 mL every 7 days and folic acid 1 mg 2 tablets daily.  She will discontinue Orencia due to inadequate response.  Previously had inadequate response to Humira and Enbrel.  CBC and CMP were drawn on 07/10/2020.  CBC and CMP ordered today.  She will require updated CBC and CMP 1 month then every 3 months after starting on rinvoq. Standing orders for CBC and CMP are in place.  TB gold negative on 12/03/19 and will continue to be monitored yearly. Plan: CBC with Differential/Platelet, COMPLETE METABOLIC PANEL WITH GFR, Hepatitis C antibody, Hepatitis B core antibody, IgM, Hepatitis B surface antigen, HIV Antibody (routine testing w rflx), She has not had any recent infections.  Discussed the importance of holding rinvoq and methotrexate if she develops signs or symptoms of an infection and to resume once the infection has completely cleared. She voiced understanding.   Primary osteoarthritis of both hands: She has PIP and DIP thickening consistent with osteoarthritis of both hands.  [place] joint prominence bilaterally.  She is having difficulty making a complete fist due to the severity of pain and stiffness in her hands.  We discussed the importance of joint protection and muscle strengthening.  Trochanteric bursitis, left hip: She continues to have intermittent discomfort due to trochanteric bursitis of the left hip.    Pain in left hip -  X-rays of hip joint was unremarkable on 05/23/2020.  She has good range of motion of the left hip on  examination today.  Primary osteoarthritis of both knees - Bilateral monovisc injections on 07/19/19.  She has good range of motion of both knee joints on examination today.  No warmth or effusion was noted.  She continues have intermittent discomfort in the left knee joint but overall her discomfort has been tolerable.  DDD (degenerative disc disease), cervical: She has limited ROM with lateral rotation. No symptoms of radiculopathy at this time.    DDD (degenerative disc disease), lumbar: She has severe scoliosis multilevel spondylosis and L4-L5 spondylolisthesis was noted.She had an MRI of the lumbar spine on 07/18/2020-findings were reviewed today in the office.  She had an epidural cortisone injection performed on 07/31/2020 by Dr. Ernestina Patches which has improved her symptoms significantly.  She has been going to physical therapy which has also been alleviating her discomfort.  Age-related osteoporosis without current pathological fracture - DEXA 07/26/2019 right 1/3 distal radius BMD 0.54 with T score -2.5.  She has not had any recent falls or fractures. First Reclast IV infusion was on August 10, 2019.  Her next Reclast infusion is due. CBC, CMP, and vitamin D will be checked today. She continues to take a calcium and vitamin D supplement.  Infusion orders will be placed pending lab results.   History of vitamin D deficiency -Vitamin D will be checked today.  Plan: Vitamin D 1,25 dihydroxy  Tremor: According to the patient she has developed a new onset tremor.  She is also noticed increased fidgeting which has been mentioned by her friends as well.  She has not followed up with Dr. Leta Baptist for further evaluation yet.  She was advised to schedule appointment with Dr. Leta Baptist for further evaluation and management.  Other medical conditions are listed as follows:   SIADH (syndrome of inappropriate ADH production) ([place]) - followed by endocrinology.  Neuropathy - she is followed by Dr. Leta Baptist.  She is  on gabapentin.  Low sodium levels  History of hyperlipidemia  History of colonic polyps  History of glaucoma  History of sleep apnea  History of gastroesophageal reflux (GERD)   Orders: Orders Placed This Encounter  Procedures  . Vitamin D 1,25 dihydroxy  . CBC with Differential/Platelet  . COMPLETE METABOLIC PANEL WITH GFR  . Hepatitis C antibody  . Hepatitis B core antibody, IgM  . Hepatitis B surface antigen  . HIV Antibody (routine testing w rflx)   No orders of the defined types were placed in this encounter.     Follow-Up Instructions: Return in about 8 weeks (around 10/17/2020) for Rheumatoid arthritis, Osteoarthritis, DDD.   Ofilia Neas, PA-C  Note - This record has been created using Dragon software.  Chart creation errors have been sought, but may not always  have been located. Such creation errors do not reflect on  the standard of medical care.

## 2020-08-22 ENCOUNTER — Encounter: Payer: Self-pay | Admitting: Physician Assistant

## 2020-08-22 ENCOUNTER — Other Ambulatory Visit: Payer: Self-pay | Admitting: Pharmacist

## 2020-08-22 ENCOUNTER — Telehealth: Payer: Self-pay | Admitting: Pharmacist

## 2020-08-22 ENCOUNTER — Other Ambulatory Visit: Payer: Self-pay

## 2020-08-22 ENCOUNTER — Ambulatory Visit: Payer: Medicare PPO | Admitting: Physician Assistant

## 2020-08-22 VITALS — BP 156/76 | HR 70 | Resp 14 | Ht 65.0 in | Wt 137.2 lb

## 2020-08-22 DIAGNOSIS — Z8639 Personal history of other endocrine, nutritional and metabolic disease: Secondary | ICD-10-CM

## 2020-08-22 DIAGNOSIS — M5030 Other cervical disc degeneration, unspecified cervical region: Secondary | ICD-10-CM | POA: Diagnosis not present

## 2020-08-22 DIAGNOSIS — M0579 Rheumatoid arthritis with rheumatoid factor of multiple sites without organ or systems involvement: Secondary | ICD-10-CM

## 2020-08-22 DIAGNOSIS — E222 Syndrome of inappropriate secretion of antidiuretic hormone: Secondary | ICD-10-CM

## 2020-08-22 DIAGNOSIS — M25562 Pain in left knee: Secondary | ICD-10-CM

## 2020-08-22 DIAGNOSIS — Z8669 Personal history of other diseases of the nervous system and sense organs: Secondary | ICD-10-CM

## 2020-08-22 DIAGNOSIS — Z79899 Other long term (current) drug therapy: Secondary | ICD-10-CM | POA: Diagnosis not present

## 2020-08-22 DIAGNOSIS — M810 Age-related osteoporosis without current pathological fracture: Secondary | ICD-10-CM

## 2020-08-22 DIAGNOSIS — E871 Hypo-osmolality and hyponatremia: Secondary | ICD-10-CM

## 2020-08-22 DIAGNOSIS — M19041 Primary osteoarthritis, right hand: Secondary | ICD-10-CM | POA: Diagnosis not present

## 2020-08-22 DIAGNOSIS — M25552 Pain in left hip: Secondary | ICD-10-CM

## 2020-08-22 DIAGNOSIS — M7062 Trochanteric bursitis, left hip: Secondary | ICD-10-CM | POA: Diagnosis not present

## 2020-08-22 DIAGNOSIS — M5442 Lumbago with sciatica, left side: Secondary | ICD-10-CM

## 2020-08-22 DIAGNOSIS — M5136 Other intervertebral disc degeneration, lumbar region: Secondary | ICD-10-CM

## 2020-08-22 DIAGNOSIS — M170 Bilateral primary osteoarthritis of knee: Secondary | ICD-10-CM | POA: Diagnosis not present

## 2020-08-22 DIAGNOSIS — E559 Vitamin D deficiency, unspecified: Secondary | ICD-10-CM | POA: Diagnosis not present

## 2020-08-22 DIAGNOSIS — Z8719 Personal history of other diseases of the digestive system: Secondary | ICD-10-CM

## 2020-08-22 DIAGNOSIS — G629 Polyneuropathy, unspecified: Secondary | ICD-10-CM

## 2020-08-22 DIAGNOSIS — M19042 Primary osteoarthritis, left hand: Secondary | ICD-10-CM

## 2020-08-22 DIAGNOSIS — R251 Tremor, unspecified: Secondary | ICD-10-CM

## 2020-08-22 DIAGNOSIS — G8929 Other chronic pain: Secondary | ICD-10-CM

## 2020-08-22 DIAGNOSIS — Z86010 Personal history of colonic polyps: Secondary | ICD-10-CM

## 2020-08-22 NOTE — Progress Notes (Addendum)
Next infusion scheduled for Reclast not yet scheduled and due for updated orders. Diagnosis: M81.0  Dose: 5mg  IV every 12 months  Last Clinic Visit: 08/22/20 Next Clinic Visit: 10/17/20  Last infusion: 08/10/19 Labs:  - WBC count is borderline elevated-11.4. Absolute neutrophils are slightly elevated. Platelet count is borderline elevated-421. - Sodium and chloride remain low but stable. Rest of CMP WNL.   SPEP did not reveal any abnormal proteins. Hep B and C negative. HIV negative. Immunoglobulins WNL  Orders placed for Reclast IV x 1 dose along with premedication of Tylenol and Benadryl to be administered 30 minutes before medication infusion.  Called patient and provided with phone number for [facility] [phone]). Advised to drink plenty of water beforehand and schedule towards end of week if she wants to spend the weekend resting after infusion.  Will f/u to ensure Reclast is scheduled  Knox Saliva, PharmD, MPH Clinical Pharmacist (Rheumatology and Pulmonology)   Addendum: Reclast scheduled for 09/14/20

## 2020-08-22 NOTE — Telephone Encounter (Signed)
Submitted a Prior Authorization request to [facility] for [facility] via Cover My Meds. Will update once we receive a response.   KeyGuy Sandifer - PA Case ID: [id number]

## 2020-08-22 NOTE — Telephone Encounter (Addendum)
Please start Rinvoq BIV.  Dose: 15mg  every day  Dx: M05.79 (RA)  Previously tried therapies: . Orencia - inadequate response . Enbrel - inadequate response . Humira - inadequate response . Methotrexate  Current regimen: [place] + methotrexate  Patient provided PAP application today. Provider portion signed and completed. Filed in pending PAP folder next to Marissa's desk  Knox Saliva, PharmD, MPH Clinical Pharmacist (Rheumatology and Pulmonology)

## 2020-08-22 NOTE — Telephone Encounter (Signed)
Received notification from [facility] regarding a prior authorization for [facility]. Authorization has been APPROVED from 08/22/20 to 06/02/21.   Authorization # [id number] Patient's copay for 1 month is $100.

## 2020-08-22 NOTE — Patient Instructions (Addendum)
Standing Labs We placed an order today for your standing lab work.   Please have your standing labs drawn in 1 month then every 3 months   If possible, please have your labs drawn 2 weeks prior to your appointment so that the provider can discuss your results at your appointment.  We have open lab daily Monday through Thursday from 1:30-4:30 PM and Friday from 1:30-4:00 PM at the office of Dr. Bo Merino, [facility].   Please be advised, all patients with office appointments requiring lab work will take precedents over walk-in lab work.  If possible, please come for your lab work on Monday and Friday afternoons, as you may experience shorter wait times. The office is located at [address] [place], [place], [place] [address] No appointment is necessary.   Labs are drawn by [facility]. Please bring your co-pay at the time of your lab draw.  You may receive a bill from [facility] for your lab work.  If you wish to have your labs drawn at another location, please call the office 24 hours in advance to send orders.  If you have any questions regarding directions or hours of operation,  please call [phone].   As a reminder, please drink plenty of water prior to coming for your lab work. Thanks!  Upadacitinib Extended-release Tablets What is this medicine? UPADACITINIB (ue PAD a SYE ti nib) is a medicine that works on the immune system. This medicine is used to treat rheumatoid arthritis. This medicine may be used for other purposes; ask your health care provider or pharmacist if you have questions. COMMON BRAND NAME(S): RINVOQ What should I tell my health care provider before I take this medicine? They need to know if you have any of these conditions:  blood clots  cancer  diabetes (high blood sugar)  heart disease  high blood pressure  high cholesterol  immune system problems  infection especially a viral infection such as chickenpox, cold sores, or  herpes  infection such as tuberculosis (TB) or other bacterial, fungal or viral infection  liver disease  low blood counts (white cells, platelets, or red blood cells)  lung or breathing disease (asthma, COPD)  organ transplant  smoke tobacco cigarettes  stomach or intestine problems  stroke  an unusual or allergic reaction to upadacitinib, other medicines, foods, dyes or preservatives  pregnant or trying to get pregnant  breast-feeding How should I use this medicine? Take this medicine by mouth with water. Take it as directed on the prescription label at the same time every day. Do not cut, crush, or chew this medicine. Swallow the tablets whole. You can take it with or without food. If it upsets your stomach, take it with food. Keep taking it unless your health care provider tells you to stop. A special MedGuide will be given to you by the pharmacist with each prescription and refill. Be sure to read this information carefully each time. Talk to your health care provider about the use of this medicine in children. Special care may be needed. Overdosage: If you think you have taken too much of this medicine contact a poison control center or emergency room at once. NOTE: This medicine is only for you. Do not share this medicine with others. What if I miss a dose? If you miss a dose, take it as soon as you can. If it is almost time for your next dose, take only that dose. Do not take double or extra doses. What  may interact with this medicine? Do not take this medicine with any of the following medications:  baricitinib  tofacitinib This medicine may also interact with the following medications:  azathioprine  biologic medicines such as abatacept, adalimumab, anakinra, certolizumab, etanercept, golimumab, infliximab, rituximab, secukinumab, tocilizumab, ustekinumab  certain medicines for fungal infections like ketoconazole, itraconazole, or posaconazole  certain medicines  for seizures like carbamazepine, phenobarbital, phenytoin  clarithromycin  cyclosporine  live vaccines  medicines that lower your chance of fighting infection  rifampin  supplements, such as St. John's wort This list may not describe all possible interactions. Give your health care provider a list of all the medicines, herbs, non-prescription drugs, or dietary supplements you use. Also tell them if you smoke, drink alcohol, or use illegal drugs. Some items may interact with your medicine. What should I watch for while using this medicine? Visit your health care provider for regular checks on your progress. Tell your health care provider if your symptoms do not start to get better or if they get worse. You may need blood work done while you are taking this medicine. Avoid taking medicines that contain aspirin, acetaminophen, ibuprofen, naproxen, or ketoprofen unless instructed by your health care provider. These medicines may hide a fever. This medicine may increase your risk of getting an infection. Call your health care provider for advice if you get a fever, chills, sore throat, or other symptoms of a cold or flu. Do not treat yourself. Try to avoid being around people who are sick. Do not become pregnant while taking this medicine. Women should inform their health care provider if they wish to become pregnant or think they might be pregnant. There is potential for serious harm to an unborn child. Talk to your health care provider for more information. Do not breast-feed an infant while taking this medicine or for 6 days after stopping it. Talk to your health care provider about your risk of cancer. You may be more at risk for certain types of cancer if you take this medicine. What side effects may I notice from receiving this medicine? Side effects that you should report to your doctor or health care professional as soon as possible:  allergic reactions (skin rash, itching or hives;  swelling of the face, lips, or tongue)  blood clot (chest pain; shortness of breath; pain, swelling, or warmth in the leg)  heart attack (trouble breathing; pain or tightness in the chest, neck, back or arms; unusually weak or tired)  infection (fever, chills, cough, sore throat, pain or trouble passing urine)  light-colored stool  liver injury (dark yellow or brown urine; general ill feeling or flu-like symptoms; loss of appetite, right upper belly pain; unusually weak or tired; yellowing of the eyes or skin)  low red blood cell counts (trouble breathing; feeling faint; lightheaded, falls; unusually weak or tired)  stroke (changes in vision; confusion; trouble speaking or understanding; severe headaches; sudden numbness or weakness of the face, arm or leg; trouble walking; dizziness; loss of balance or coordination)  tears in the stomach or intestines (fever; stomach pain; sudden change in bowel habits) Side effects that usually do not require medical attention (report these to your doctor or health care professional if they continue or are bothersome):  nasal congestion (runny or stuffy nose)  nausea This list may not describe all possible side effects. Call your doctor for medical advice about side effects. You may report side effects to FDA at 1-800-FDA-1088. Where should I keep my medicine? Keep  out of the reach of children and pets. Store at room temperature between 20 and 25 degrees C (68 and 77 degrees F). Get rid of any unused medicine after the expiration date. To get rid of medicines that are no longer needed or have expired:  Take the medicine to a medicine take-back program. Check with your pharmacy or law enforcement to find a location.  If you cannot return the medicine, check the label or package insert to see if the medicine should be thrown out in the garbage or flushed down the toilet. If you are not sure, ask your health care provider. If it is safe to put it in the  trash, empty the medicine out of the container. Mix the medicine with cat litter, dirt, coffee grounds, or other unwanted substance. Seal the mixture in a bag or container. Put it in the trash. NOTE: This sheet is a summary. It may not cover all possible information. If you have questions about this medicine, talk to your doctor, pharmacist, or health care provider.  2021 Elsevier/Gold Standard (2020-02-04 12:08:20)

## 2020-08-22 NOTE — Progress Notes (Signed)
Pharmacy Note  Subjective: Patient presents today to [facility] for follow up office visit. Patient seen by the pharmacist for counseling on Rinvoq for rheumatoid arthritis..  Previous therapy include: Enbrel, Humira, Orencia, methotrexate  Objective:  CMP     Component Value Date/Time   NA 132 (L) 07/10/2020 1346   K 4.6 07/10/2020 1346   CL 99 07/10/2020 1346   CO2 27 07/10/2020 1346   GLUCOSE 98 07/10/2020 1346   BUN 15 07/10/2020 1346   CREATININE 0.66 07/10/2020 1346   CALCIUM 9.5 07/10/2020 1346   PROT 6.1 07/10/2020 1346   PROT 6.3 10/20/2019 0841   ALBUMIN 4.1 11/18/2019 0855   ALBUMIN 4.1 10/20/2019 0841   AST 21 07/10/2020 1346   ALT 15 07/10/2020 1346   ALKPHOS 74 11/18/2019 0855    CBC    Component Value Date/Time   WBC 9.5 07/10/2020 1346   RBC 3.77 (L) 07/10/2020 1346   HGB 12.1 07/10/2020 1346   HGB 11.9 (L) 08/01/2018 0921   HCT 35.8 07/10/2020 1346   PLT 388 07/10/2020 1346   PLT 372 08/01/2018 0921   MCV 95.0 07/10/2020 1346   MCH 32.1 07/10/2020 1346   MCHC 33.8 07/10/2020 1346   RDW 13.2 07/10/2020 1346    Baseline Immunosuppressant Therapy Labs TB GOLD Quantiferon TB Gold Latest Ref Rng & Units 12/03/2019  Quantiferon TB Gold Plus NEGATIVE NEGATIVE   Hepatitis Panel   HIV No results found for: HIV Immunoglobulins Immunoglobulin Electrophoresis Latest Ref Rng & Units 02/25/2018  IgG 700 - 1,600 mg/dL 811  IgM 26 - 217 mg/dL 116   SPEP Serum Protein Electrophoresis Latest Ref Rng & Units 07/10/2020  Total Protein 6.1 - 8.1 g/dL 6.1  Albumin 3.8 - 4.8 g/dL -  Alpha-1 0.2 - 0.3 g/dL -  Alpha-2 0.5 - 0.9 g/dL -  Beta Globulin 0.4 - 0.6 g/dL -  Beta 2 0.2 - 0.5 g/dL -  Gamma Globulin 0.8 - 1.7 g/dL -   G6PD No results found for: G6PDH TPMT No results found for: TPMT   Lipid Panel Lab Results  Component Value Date   CHOL 146 10/20/2019   HDL 59 10/20/2019   LDLCALC 72 10/20/2019   LDLDIRECT 160.4 12/14/2009   TRIG  78 10/20/2019   CHOLHDL 2.5 10/20/2019     Does patient have history of diverticulitis?  No. However patient has history of diverticulosis. Last seen by GI on 06/28/20  Assessment/Plan:  Counseled patient that Rinvoq is a JAK inhibitor indicated for Rheumatoid Arthritis.  Counseled patient on purpose, proper use, and adverse effects of Rinvoq.    Reviewed the most common adverse effects including infection, diarrhea, headaches.  Also reviewed rare adverse effects such as bowel injury and the need to contact us if they develop stomach pain during treatment. Counseled on the increase risk of venous thrombosis. Reviewed with patient that there is the possibility of an increased risk of malignancy but it is not well understood if this increased risk is due to the medication or the disease state.  Instructed patient that medication should be held for infection and prior to surgery.  Advised patient to avoid live vaccines. Recommend annual influenza, Pneumovax 23, Prevnar 13, and Shingrix as indicated.  Patient UTD on vaccines. Has received 2 Enon vaccines as well as booster.   Reviewed the importance of routine lab monitoring including lipid panel.  Standing orders placed. Provided patient with medication education material and answered all questions.  Patient consented  to Rinvoq.  Will upload into patient's chart.  Will apply through patient's insurance and update when we receive a response.  Patient dose will be 15 mg daily.  Prescription will be sent to pharmacy pending lab results and insurance approval. Baseline immunosuppressive monitoring labs drawn today-  Unable to find in [facility] or Epic. Patient provided with PAP application to complete - states she is right above income threshold for household of 1. Discussed that we could submit appeal that states that her income does not adequately reflect her. She lives with roommate with whom she shares some expenses. Patient verbalized understanding .   She currently pays $100 per month for Orencia

## 2020-08-24 ENCOUNTER — Ambulatory Visit: Payer: Medicare PPO | Admitting: Physical Therapy

## 2020-08-24 ENCOUNTER — Encounter: Payer: Self-pay | Admitting: Physical Therapy

## 2020-08-24 ENCOUNTER — Other Ambulatory Visit: Payer: Self-pay

## 2020-08-24 DIAGNOSIS — R262 Difficulty in walking, not elsewhere classified: Secondary | ICD-10-CM

## 2020-08-24 DIAGNOSIS — R293 Abnormal posture: Secondary | ICD-10-CM | POA: Diagnosis not present

## 2020-08-24 DIAGNOSIS — M5416 Radiculopathy, lumbar region: Secondary | ICD-10-CM

## 2020-08-24 DIAGNOSIS — M6281 Muscle weakness (generalized): Secondary | ICD-10-CM

## 2020-08-24 NOTE — Therapy (Signed)
[facility] [address] [place], [place], [address] Phone: [phone]   Fax:  [phone]  Physical Therapy Treatment  Patient Details  Name: Felicia [name] MRN: [id number] Date of Birth: [birth year] Referring Provider (PT): Magnus Sinning MD   Encounter Date: 08/24/2020   PT End of Session - 08/24/20 1011    Visit Number 2    Number of Visits 16    Date for PT Re-Evaluation 09/29/20    Authorization Type MCR: kx mod by 15th visit, progress note at 10th     PT Start Time 1012    PT Stop Time 1059    PT Time Calculation (min) 47 min    Activity Tolerance Patient tolerated treatment well;No increased pain    Behavior During Therapy WFL for tasks assessed/performed           Past Medical History:  Diagnosis Date  . Adenomatous colon polyp 2004   Colo in 2009 "no polyps"  . Complication of anesthesia    small airway per patient  . GERD (gastroesophageal reflux disease)   . Glaucoma   . Hiatal hernia   . Hypertension   . Neuropathy   . Rheumatoid arthritis(714.0)    Orencia; Methotrexate (Dr. Patrecia Pour)  . Sleep apnea    on CPAP    Past Surgical History:  Procedure Laterality Date  . CARDIAC CATHETERIZATION N/A 03/29/2015   Procedure: Left Heart Cath and Coronary Angiography;  Surgeon: Belva Crome, MD;  Location: [facility];  Service: Cardiovascular;  Laterality: N/A;  . CATARACT EXTRACTION, BILATERAL    . COLONOSCOPY W/ POLYPECTOMY     X1; negative subsequently  . DILATION AND CURETTAGE OF UTERUS    . EYE SURGERY     due to RA  . EYE SURGERY Left 06/2017   eyelid tendon   . HYSTEROSCOPY WITH D & C  06/24/2012   Procedure: DILATATION AND CURETTAGE /HYSTEROSCOPY;  Surgeon: [name], MD;  Location: [facility];  Service: Gynecology;  Laterality: N/A;  . SHOULDER SURGERY Left    due to RA  . TRABECULECTOMY     X 2 OD; X 1 OS  . UPPER GASTROINTESTINAL ENDOSCOPY     hiatal hernia    There were no vitals filed  for this visit.   Subjective Assessment - 08/24/20 1014    Subjective Very little pain today.    Pertinent History RA    Limitations Sitting    How long can you sit comfortably? < 30 minutes    Patient Stated Goals Return to walking for exercise without L leg or low back pain    Currently in Pain? Yes    Pain Score 4     Pain Location Leg    Pain Orientation Left                             [facility] Adult PT Treatment/Exercise - 08/24/20 0001      Therapeutic Activites    Therapeutic Activities ADL's    ADL's sit<>supine transfers x 4 with VCs      Lumbar Exercises: Stretches   Figure 4 Stretch 20 seconds;2 reps;With overpressure      Lumbar Exercises: Standing   Row Both;10 reps    Theraband Level (Row) Level 2 (Red)    Shoulder Extension 10 reps    Theraband Level (Shoulder Extension) Level 2 (Red)    Other Standing Lumbar Exercises Trunk extension AROM  5X 3 seconds    Other Standing Lumbar Exercises Shoulder blade pinches 3X 5 seconds      Lumbar Exercises: Supine   Ab Set 5 reps;5 seconds    Bridge 10 reps    Straight Leg Raise 10 reps    Straight Leg Raises Limitations bil with ab set      Lumbar Exercises: Sidelying   Hip Abduction Left;Right;10 reps      Lumbar Exercises: Prone   Straight Leg Raise 10 reps    Straight Leg Raises Limitations bil; lying on pillow      Manual Therapy   Manual therapy comments LL traction left 2x 20 sec                  PT Education - 08/24/20 1256    Education Details HEP progressed    Person(s) Educated Patient    Methods Explanation;Demonstration;Handout    Comprehension Verbalized understanding;Returned demonstration            PT Short Term Goals - 08/11/20 1508      PT SHORT TERM GOAL #1   Title Railee will report no L LE radicular symptoms below the knee in > 1 week.    Baseline Symptoms below the knee.    Time 4    Period Weeks    Status New    Target Date 09/08/20      PT SHORT TERM  GOAL #3   Target Date 09/02/18             PT Long Term Goals - 08/11/20 1510      PT LONG TERM GOAL #1   Title Indiyah will score 55 on the FOTO Functional Assessment at DC.    Baseline 42    Time 8    Period Weeks    Status New    Target Date 10/06/20      PT LONG TERM GOAL #2   Title Rether will report no L LE symptoms for > 1 week.    Baseline L LE symptoms to foot.    Time 8    Period Weeks    Status New    Target Date 10/06/20      PT LONG TERM GOAL #3   Title Annalee will be able to walk a mile without low back pain or radicular pain at DC.    Baseline Not walking for exercise and L LE radicular symptoms to the foot.    Time 8    Period Weeks    Status New    Target Date 10/06/20      PT LONG TERM GOAL #4   Title Davetta will be independent with her long-term HEP at DC.    Time 8    Period Weeks    Status New    Target Date 10/06/20                 Plan - 08/24/20 1249    Clinical Impression Statement Patient reported decreased pain today overall. This was her 1st f/u visit since eval. She has been compliant with HEP except when having exacerbations of her RA.  She tolerated additional TE today without complaint. We further reviewed sit<>supine transfers to get down correct form. No goals met as only second visit.    Personal Factors and Comorbidities Comorbidity 2    Comorbidities Neuropathy and RA    Examination-Activity Limitations Sit;Sleep;Bed Mobility;Bend;Lift    Examination-Participation Restrictions Community Activity    PT Frequency 2x /  week    PT Duration 8 weeks    PT Treatment/Interventions ADLs/Self Care Home Management;Cryotherapy;Electrical Stimulation;Moist Heat;Traction;Therapeutic exercise;Balance training;Therapeutic activities;Manual techniques;Patient/family education;Neuromuscular re-education;Dry needling    PT Next Visit Plan Body mechanics and postural education, leg strengthening, balance and continued postural and spine strength work  (careful with knee OA)    PT Home Exercise Plan Access Code: [email or web address]    Consulted and Agree with Plan of Care Patient           Patient will benefit from skilled therapeutic intervention in order to improve the following deficits and impairments:  Decreased endurance,Decreased activity tolerance,Pain,Postural dysfunction,Improper body mechanics,Decreased strength,Decreased balance,Decreased range of motion,Difficulty walking,Impaired flexibility  Visit Diagnosis: Difficulty walking  Abnormal posture  Muscle weakness (generalized)  Radiculopathy, lumbar region     Problem List Patient Active Problem List   Diagnosis Date Noted  . Neuropathy 06/16/2020  . SIADH (syndrome of inappropriate ADH production) ([place]) 02/18/2020  . Hyponatremia 11/18/2019  . Thrombocytosis 08/01/2018  . URI (upper respiratory infection) 04/14/2017  . Primary osteoarthritis of both knees 03/06/2017  . Exertional chest pain   . Pain in the chest 03/27/2015  . Postural hypotension 05/26/2013  . OSA (obstructive sleep apnea) 03/22/2013  . Osteoarthritis 11/07/2011  . Abdominal bruit 10/16/2011  . HTN (hypertension) 08/13/2011  . Anisocoria 08/13/2011  . Cervical radiculopathy 07/17/2011  . DDD (degenerative disc disease), cervical 07/10/2011  . Glaucoma 06/06/2011  . HIATAL HERNIA 01/30/2010  . POSTMENOPAUSAL SYNDROME 12/14/2009  . EUSTACHIAN TUBE DYSFUNCTION, RIGHT 06/28/2009  . GERD 12/07/2008  . Rheumatoid arthritis ([place]) 12/07/2008  . COLONIC POLYPS, HX OF 12/07/2008  . Hyperlipidemia LDL goal <70 10/17/2006  . HYPERCALCEMIA 10/17/2006  . Mitral valve disease 10/17/2006   Madelyn Flavors PT 08/24/2020, 12:56 PM  [facility] [address] [place], [place], [address] Phone: [phone]   Fax:  [phone]  Name: JEROLYN FLENNIKEN MRN: [id number] Date of Birth: 1941/01/06

## 2020-08-24 NOTE — Patient Instructions (Signed)
Access Code: [email or web address] URL: [email or web address] Date: 08/24/2020 Prepared by: Almyra Free  Exercises Standing Scapular Retraction - 5 x daily - 7 x weekly - 1 sets - 5 reps - 5 second hold Standing Lumbar Extension at [place] - 5 x daily - 7 x weekly - 1 sets - 5 reps - 3 seconds hold Supine Figure 4 Piriformis Stretch - 2-3 x daily - 7 x weekly - 1 sets - 5 reps - 20 seconds hold Supine Bridge - 2 x daily - 7 x weekly - 1-3 sets - 10 reps Supine Active Straight Leg Raise - 1 x daily - 7 x weekly - 2 sets - 10 reps Sidelying Hip Abduction - 1 x daily - 7 x weekly - 2 sets - 10 reps Prone Hip Extension - 1 x daily - 7 x weekly - 2 sets - 10 reps

## 2020-08-24 NOTE — Progress Notes (Signed)
WBC count is borderline elevated-11.4.  Absolute neutrophils are slightly elevated. Platelet count is borderline elevated-421.  We will continue to monitor.  Sodium and chloride remain low but stable. Rest of CMP WNL.   SPEP did not reveal any abnormal proteins.  Hep B and C negative.  HIV negative.  Immunoglobulins WNL.

## 2020-08-25 LAB — CBC WITH DIFFERENTIAL/PLATELET
Absolute Monocytes: 638 cells/uL (ref 200–950)
Basophils Absolute: 68 cells/uL (ref 0–200)
Basophils Relative: 0.6 %
Eosinophils Absolute: 46 cells/uL (ref 15–500)
Eosinophils Relative: 0.4 %
HCT: 37.3 % (ref 35.0–45.0)
Hemoglobin: 12.1 g/dL (ref 11.7–15.5)
Lymphs Abs: 1972 cells/uL (ref 850–3900)
MCH: 31.4 pg (ref 27.0–33.0)
MCHC: 32.4 g/dL (ref 32.0–36.0)
MCV: 96.9 fL (ref 80.0–100.0)
MPV: 9.6 fL (ref 7.5–12.5)
Monocytes Relative: 5.6 %
Neutro Abs: 8675 cells/uL — ABNORMAL HIGH (ref 1500–7800)
Neutrophils Relative %: 76.1 %
Platelets: 421 10*3/uL — ABNORMAL HIGH (ref 140–400)
RBC: 3.85 10*6/uL (ref 3.80–5.10)
RDW: 13.1 % (ref 11.0–15.0)
Total Lymphocyte: 17.3 %
WBC: 11.4 10*3/uL — ABNORMAL HIGH (ref 3.8–10.8)

## 2020-08-25 LAB — COMPLETE METABOLIC PANEL WITH GFR
AG Ratio: 1.7 (calc) (ref 1.0–2.5)
ALT: 20 U/L (ref 6–29)
AST: 24 U/L (ref 10–35)
Albumin: 4.3 g/dL (ref 3.6–5.1)
Alkaline phosphatase (APISO): 67 U/L (ref 37–153)
BUN: 14 mg/dL (ref 7–25)
CO2: 23 mmol/L (ref 20–32)
Calcium: 9.8 mg/dL (ref 8.6–10.4)
Chloride: 96 mmol/L — ABNORMAL LOW (ref 98–110)
Creat: 0.75 mg/dL (ref 0.60–0.93)
GFR, Est African American: 88 mL/min/{1.73_m2} (ref >=60)
GFR, Est Non African American: 76 mL/min/{1.73_m2} (ref >=60)
Globulin: 2.6 g/dL (calc) (ref 1.9–3.7)
Glucose, Bld: 95 mg/dL (ref 65–99)
Potassium: 4.7 mmol/L (ref 3.5–5.3)
Sodium: 131 mmol/L — ABNORMAL LOW (ref 135–146)
Total Bilirubin: 0.5 mg/dL (ref 0.2–1.2)
Total Protein: 6.9 g/dL (ref 6.1–8.1)

## 2020-08-25 LAB — HEPATITIS B SURFACE ANTIGEN: Hepatitis B Surface Ag: NONREACTIVE

## 2020-08-25 LAB — VITAMIN D 1,25 DIHYDROXY
Vitamin D 1, 25 (OH)2 Total: 41 pg/mL (ref 18–72)
Vitamin D2 1, 25 (OH)2: <8 pg/mL
Vitamin D3 1, 25 (OH)2: 41 pg/mL

## 2020-08-25 LAB — PROTEIN ELECTROPHORESIS, SERUM, WITH REFLEX
Albumin ELP: 3.9 g/dL (ref 3.8–4.8)
Alpha 1: 0.4 g/dL — ABNORMAL HIGH (ref 0.2–0.3)
Alpha 2: 1 g/dL — ABNORMAL HIGH (ref 0.5–0.9)
Beta 2: 0.3 g/dL (ref 0.2–0.5)
Beta Globulin: 0.4 g/dL (ref 0.4–0.6)
Gamma Globulin: 0.8 g/dL (ref 0.8–1.7)
Total Protein: 6.8 g/dL (ref 6.1–8.1)

## 2020-08-25 LAB — IGG, IGA, IGM
IgG (Immunoglobin G), Serum: 810 mg/dL (ref 600–1540)
IgM, Serum: 99 mg/dL (ref 50–300)
Immunoglobulin A: 167 mg/dL (ref 70–320)

## 2020-08-25 LAB — HEPATITIS B CORE ANTIBODY, IGM: Hep B C IgM: NONREACTIVE

## 2020-08-25 LAB — HIV ANTIBODY (ROUTINE TESTING W REFLEX): HIV 1&2 Ab, 4th Generation: NONREACTIVE

## 2020-08-25 LAB — HEPATITIS C ANTIBODY
Hepatitis C Ab: NONREACTIVE
SIGNAL TO CUT-OFF: 0 (ref <1.00)

## 2020-08-25 NOTE — Telephone Encounter (Signed)
Patient advised that Rinvoq 15mg  tablets was approved through her insurance. She is aggreable with paying copay of $100 per month and believes she will not qualify for the program. She has three Orencia doses remaining at home and would like to receive Reclast infusion and finish Orencia supply prior to changing medication.  Requested patient to reach out to me when she has used her last Orencia dose so we can send the Rinvoq prescription to the pharmacy.  Consent was signed at office visit.   Patient verbalized understanding and gratitude - she will reach out in approximately 3 weeks

## 2020-08-28 NOTE — Progress Notes (Signed)
Vitamin D WNL.  Continue maintenance dose of vitamin D.

## 2020-08-30 ENCOUNTER — Other Ambulatory Visit: Payer: Self-pay

## 2020-08-30 ENCOUNTER — Ambulatory Visit: Payer: Medicare PPO | Admitting: Rehabilitative and Restorative Service Providers"

## 2020-08-30 ENCOUNTER — Encounter: Payer: Self-pay | Admitting: Rehabilitative and Restorative Service Providers"

## 2020-08-30 DIAGNOSIS — M5416 Radiculopathy, lumbar region: Secondary | ICD-10-CM | POA: Diagnosis not present

## 2020-08-30 DIAGNOSIS — R293 Abnormal posture: Secondary | ICD-10-CM | POA: Diagnosis not present

## 2020-08-30 DIAGNOSIS — R262 Difficulty in walking, not elsewhere classified: Secondary | ICD-10-CM

## 2020-08-30 DIAGNOSIS — M6281 Muscle weakness (generalized): Secondary | ICD-10-CM

## 2020-08-30 NOTE — Therapy (Signed)
[facility] Physical Therapy [address] [place], [place], [address] Phone: [phone]   Fax:  [phone]  Physical Therapy Treatment  Patient Details  Name: Felicia [name] MRN: [id number] Date of Birth: 1940/09/07 Referring Provider (PT): Magnus Sinning MD   Encounter Date: 08/30/2020   PT End of Session - 08/30/20 1656    Visit Number 3    Number of Visits 16    Date for PT Re-Evaluation 09/29/20    Authorization Type MCR: kx mod by 15th visit, progress note at 10th     PT Start Time 1600    PT Stop Time 1646    PT Time Calculation (min) 46 min    Activity Tolerance Patient tolerated treatment well;No increased pain    Behavior During Therapy WFL for tasks assessed/performed           Past Medical History:  Diagnosis Date  . Adenomatous colon polyp 2004   Colo in 2009 "no polyps"  . Complication of anesthesia    small airway per patient  . GERD (gastroesophageal reflux disease)   . Glaucoma   . Hiatal hernia   . Hypertension   . Neuropathy   . Rheumatoid arthritis(714.0)    Orencia; Methotrexate (Dr. Patrecia Pour)  . Sleep apnea    on CPAP    Past Surgical History:  Procedure Laterality Date  . CARDIAC CATHETERIZATION N/A 03/29/2015   Procedure: Left Heart Cath and Coronary Angiography;  Surgeon: Belva Crome, MD;  Location: [facility] [facility];  Service: Cardiovascular;  Laterality: N/A;  . CATARACT EXTRACTION, BILATERAL    . COLONOSCOPY W/ POLYPECTOMY     X1; negative subsequently  . DILATION AND CURETTAGE OF UTERUS    . EYE SURGERY     due to RA  . EYE SURGERY Left 06/2017   eyelid tendon   . HYSTEROSCOPY WITH D & C  06/24/2012   Procedure: DILATATION AND CURETTAGE /HYSTEROSCOPY;  Surgeon: [name], MD;  Location: [facility];  Service: Gynecology;  Laterality: N/A;  . SHOULDER SURGERY Left    due to RA  . TRABECULECTOMY     X 2 OD; X 1 OS  . UPPER GASTROINTESTINAL ENDOSCOPY     hiatal hernia    There were no vitals filed  for this visit.   Subjective Assessment - 08/30/20 1652    Subjective Felicia [name] notes she has much less L leg pain and is walking better as compared to evaluation.    Pertinent History RA    Limitations Sitting    How long can you sit comfortably? < 30 minutes    Patient Stated Goals Return to walking for exercise without L leg or low back pain    Currently in Pain? Yes    Pain Score 2     Pain Location Leg    Pain Orientation Left    Pain Descriptors / Indicators Sore;Aching;Tiring    Pain Type Chronic pain    Pain Radiating Towards L gluteals and hip (was to the foot)    Pain Onset More than a month ago    Pain Frequency Intermittent    Aggravating Factors  Sitting    Pain Relieving Factors Exercises and change of position    Effect of Pain on Daily Activities Limited distance with walking for exercise, limited endurance with sitting    Multiple Pain Sites No  [facility] Adult PT Treatment/Exercise - 08/30/20 0001      Posture/Postural Control   Posture/Postural Control Postural limitations    Postural Limitations Forward head;Rounded Shoulders;Decreased lumbar lordosis      Therapeutic Activites    Therapeutic Activities ADL's    ADL's Log roll and bed mobility      Exercises   Exercises Lumbar      Lumbar Exercises: Stretches   Figure 4 Stretch 5 reps;20 seconds;With overpressure      Lumbar Exercises: Standing   Scapular Retraction Strengthening;Both;10 reps;Other (comment)   5 seconds shoulder blade pinches   Other Standing Lumbar Exercises Trunk extension AROM 10X 3 seconds    Other Standing Lumbar Exercises Hip hike alternating 3 sets of 10 for 3 seconds (in doorway or at high treatment table, no lean, no slouch)      Lumbar Exercises: Seated   Sit to Stand 10 reps;Other (comment)    Sit to Stand Limitations 2 sets      Lumbar Exercises: Prone   Straight Leg Raise 10 reps;3 seconds    Straight Leg Raises Limitations 2 sets,  Knees on pillow                  PT Education - 08/30/20 1655    Education Details Reviewed some body mechanics and bed mobility, updated and reviewed HEP.    Person(s) Educated Patient    Methods Explanation;Demonstration;Verbal cues;Handout    Comprehension Verbal cues required;Returned demonstration;Need further instruction;Verbalized understanding            PT Short Term Goals - 08/30/20 1656      PT SHORT TERM GOAL #1   Title Felicia [name] will report no L LE radicular symptoms below the knee in > 1 week.    Baseline Symptoms below the knee.    Time 4    Period Weeks    Status Achieved    Target Date 09/08/20      PT SHORT TERM GOAL #3   Target Date 09/02/18             PT Long Term Goals - 08/30/20 1656      PT LONG TERM GOAL #1   Title Felicia [name] will score 55 on the FOTO Functional Assessment at DC.    Baseline 42    Time 8    Period Weeks    Status On-going      PT LONG TERM GOAL #2   Title Felicia [name] will report no L LE symptoms for > 1 week.    Baseline L LE symptoms to foot.    Time 8    Period Weeks    Status Partially Met      PT LONG TERM GOAL #3   Title Felicia [name] will be able to walk a mile without low back pain or radicular pain at DC.    Baseline Not walking for exercise and L LE radicular symptoms to the foot.    Time 8    Period Weeks    Status On-going      PT LONG TERM GOAL #4   Title Felicia [name] will be independent with her long-term HEP at DC.    Time 8    Period Weeks    Status On-going                 Plan - 08/30/20 1637    Clinical Impression Statement Felicia [name] is happy with her early progress with PT.  Her L leg is improving, although weakness  and fatigue limit participation with her walking.  Continue core and leg strength progressions to meet LTGs and prepare Felicia [name] for more independent rehabilitation.    Personal Factors and Comorbidities Comorbidity 2    Comorbidities Neuropathy and RA    Examination-Activity Limitations Sit;Sleep;Bed  Mobility;Bend;Lift    Examination-Participation Restrictions Community Activity    Rehab Potential Good    PT Frequency 2x / week    PT Duration 8 weeks    PT Treatment/Interventions ADLs/Self Care Home Management;Cryotherapy;Electrical Stimulation;Moist Heat;Traction;Therapeutic exercise;Balance training;Therapeutic activities;Manual techniques;Patient/family education;Neuromuscular re-education;Dry needling    PT Next Visit Plan Body mechanics and postural education, leg strengthening, balance and continued postural and spine strength work (careful with knee OA)    PT Home Exercise Plan Access Code: [email or web address]    Consulted and Agree with Plan of Care Patient           Patient will benefit from skilled therapeutic intervention in order to improve the following deficits and impairments:  Decreased endurance,Decreased activity tolerance,Pain,Postural dysfunction,Improper body mechanics,Decreased strength,Decreased balance,Decreased range of motion,Difficulty walking,Impaired flexibility  Visit Diagnosis: Difficulty walking  Abnormal posture  Muscle weakness (generalized)  Radiculopathy, lumbar region     Problem List Patient Active Problem List   Diagnosis Date Noted  . Neuropathy 06/16/2020  . SIADH (syndrome of inappropriate ADH production) ([place]) 02/18/2020  . Hyponatremia 11/18/2019  . Thrombocytosis 08/01/2018  . URI (upper respiratory infection) 04/14/2017  . Primary osteoarthritis of both knees 03/06/2017  . Exertional chest pain   . Pain in the chest 03/27/2015  . Postural hypotension 05/26/2013  . OSA (obstructive sleep apnea) 03/22/2013  . Osteoarthritis 11/07/2011  . Abdominal bruit 10/16/2011  . HTN (hypertension) 08/13/2011  . Anisocoria 08/13/2011  . Cervical radiculopathy 07/17/2011  . DDD (degenerative disc disease), cervical 07/10/2011  . Glaucoma 06/06/2011  . HIATAL HERNIA 01/30/2010  . POSTMENOPAUSAL SYNDROME 12/14/2009  . EUSTACHIAN TUBE DYSFUNCTION,  RIGHT 06/28/2009  . GERD 12/07/2008  . Rheumatoid arthritis ([place]) 12/07/2008  . COLONIC POLYPS, HX OF 12/07/2008  . Hyperlipidemia LDL goal <70 10/17/2006  . HYPERCALCEMIA 10/17/2006  . Mitral valve disease 10/17/2006    Farley Ly PT, MPT 08/30/2020, 4:59 PM  [facility] Physical Therapy [address] [place], [place], [address] Phone: [phone]   Fax:  [phone]  Name: Felicia [name] MRN: [id number] Date of Birth: [birth year]

## 2020-08-30 NOTE — Patient Instructions (Signed)
Access Code: [email or web address] URL: [email or web address] Date: 08/30/2020 Prepared by: Vista Mink  Exercises Standing Scapular Retraction - 5 x daily - 7 x weekly - 1 sets - 5 reps - 5 second hold Standing Lumbar Extension at [place] - 5 x daily - 7 x weekly - 1 sets - 5 reps - 3 seconds hold Supine Figure 4 Piriformis Stretch - 2-3 x daily - 7 x weekly - 1 sets - 5 reps - 20 seconds hold Prone Hip Extension - 1 x daily - 7 x weekly - 2 sets - 10 reps Standing Hip Hiking - 2 x daily - 7 x weekly - 2 sets - 10 reps - 3 seconds hold Sit to Stand with Armchair - 2 x daily - 7 x weekly - 1 sets - 10 reps

## 2020-08-31 ENCOUNTER — Ambulatory Visit: Payer: Medicare PPO | Admitting: Rehabilitative and Restorative Service Providers"

## 2020-08-31 ENCOUNTER — Encounter: Payer: Self-pay | Admitting: Rehabilitative and Restorative Service Providers"

## 2020-08-31 DIAGNOSIS — M6281 Muscle weakness (generalized): Secondary | ICD-10-CM | POA: Diagnosis not present

## 2020-08-31 DIAGNOSIS — R262 Difficulty in walking, not elsewhere classified: Secondary | ICD-10-CM

## 2020-08-31 DIAGNOSIS — R293 Abnormal posture: Secondary | ICD-10-CM | POA: Diagnosis not present

## 2020-08-31 DIAGNOSIS — M5416 Radiculopathy, lumbar region: Secondary | ICD-10-CM

## 2020-08-31 NOTE — Therapy (Signed)
[facility] Physical Therapy [address] [place], [place], [address] Phone: [phone]   Fax:  [phone]  Physical Therapy Treatment/Reassessment  Patient Details  Name: Felicia [name] MRN: [id number] Date of Birth: Mar 08, 1941 Referring Provider (PT): Magnus Sinning MD   Encounter Date: 08/31/2020   PT End of Session - 08/31/20 1336    Visit Number 4    Number of Visits 16    Date for PT Re-Evaluation 09/29/20    Authorization Type MCR: kx mod by 15th visit, progress note at 10th     PT Start Time 1015    PT Stop Time 1100    PT Time Calculation (min) 45 min    Activity Tolerance Patient tolerated treatment well;No increased pain    Behavior During Therapy WFL for tasks assessed/performed           Past Medical History:  Diagnosis Date  . Adenomatous colon polyp 2004   Colo in 2009 "no polyps"  . Complication of anesthesia    small airway per patient  . GERD (gastroesophageal reflux disease)   . Glaucoma   . Hiatal hernia   . Hypertension   . Neuropathy   . Rheumatoid arthritis(714.0)    Orencia; Methotrexate (Dr. Patrecia Pour)  . Sleep apnea    on CPAP    Past Surgical History:  Procedure Laterality Date  . CARDIAC CATHETERIZATION N/A 03/29/2015   Procedure: Left Heart Cath and Coronary Angiography;  Surgeon: Belva Crome, MD;  Location: [place] [facility];  Service: Cardiovascular;  Laterality: N/A;  . CATARACT EXTRACTION, BILATERAL    . COLONOSCOPY W/ POLYPECTOMY     X1; negative subsequently  . DILATION AND CURETTAGE OF UTERUS    . EYE SURGERY     due to RA  . EYE SURGERY Left 06/2017   eyelid tendon   . HYSTEROSCOPY WITH D & C  06/24/2012   Procedure: DILATATION AND CURETTAGE /HYSTEROSCOPY;  Surgeon: [name], MD;  Location: [facility];  Service: Gynecology;  Laterality: N/A;  . SHOULDER SURGERY Left    due to RA  . TRABECULECTOMY     X 2 OD; X 1 OS  . UPPER GASTROINTESTINAL ENDOSCOPY     hiatal hernia    There were no  vitals filed for this visit.   Subjective Assessment - 08/31/20 1050    Subjective Felicia [name] notes she has much less L leg pain and is walking better as compared to evaluation.  Symptoms are mostly gluteal.    Pertinent History RA    Limitations Sitting    How long can you sit comfortably? < 30 minutes    How long can you stand comfortably? 30 min     How long can you walk comfortably? Has not been walking for exercise    Diagnostic tests 03/10/2018 MRI,     Patient Stated Goals Return to walking for exercise without L leg or low back pain    Currently in Pain? Yes    Pain Score 2     Pain Location Other (Comment)   Gluteal   Pain Orientation Left    Pain Descriptors / Indicators Tightness;Sore    Pain Type Chronic pain    Pain Radiating Towards L gluteals (was to the foot)    Pain Onset More than a month ago    Pain Frequency Intermittent    Aggravating Factors  Sitting    Pain Relieving Factors Exercises and change of position    Effect of Pain on  Daily Activities Limited endurance with walking for exercise, limited sitting endurance    Multiple Pain Sites No              [facility] PT Assessment - 08/31/20 0001      Observation/Other Assessments   Focus on Therapeutic Outcomes (FOTO)  52 (was 42, 55 Goal)      ROM / Strength   AROM / PROM / Strength AROM      AROM   Overall AROM  Deficits    AROM Assessment Site Lumbar;Hip    Right/Left Hip Left;Right    Right Hip Flexion 120    Right Hip External Rotation  38    Right Hip Internal Rotation  12    Left Hip Flexion 120    Left Hip External Rotation  35    Left Hip Internal Rotation  18    Lumbar Extension 15      Flexibility   Soft Tissue Assessment /Muscle Length yes    Hamstrings 50 degrees/50 degrees                         [facility] Adult PT Treatment/Exercise - 08/31/20 0001      Posture/Postural Control   Posture/Postural Control Postural limitations    Postural Limitations Forward head;Rounded  Shoulders;Decreased lumbar lordosis      Therapeutic Activites    Therapeutic Activities ADL's    ADL's Golfer's lift      Exercises   Exercises Lumbar      Lumbar Exercises: Stretches   Figure 4 Stretch 5 reps;20 seconds;With overpressure      Lumbar Exercises: Standing   Scapular Retraction Strengthening;Both;10 reps;Other (comment)   5 seconds shoulder blade pinches   Other Standing Lumbar Exercises Trunk extension AROM 10X 3 seconds    Other Standing Lumbar Exercises Hip hike and standing hip extension (toe in)  alternating 2 sets of 10 for 3 seconds (in doorway or at high treatment table, no lean, no slouch)      Lumbar Exercises: Prone   Straight Leg Raise 10 reps;3 seconds    Straight Leg Raises Limitations Knees on pillow                  PT Education - 08/31/20 1334    Education Details Reviewed HEP and reassessment findings.  Practical body mechanics with bed mobility and golfer's lift.    Person(s) Educated Patient    Methods Explanation;Demonstration;Tactile cues;Verbal cues;Handout    Comprehension Verbalized understanding;Tactile cues required;Returned demonstration;Need further instruction;Verbal cues required            PT Short Term Goals - 08/31/20 1056      PT SHORT TERM GOAL #1   Title Felicia [name] will report no L LE radicular symptoms below the knee in > 1 week.    Baseline Symptoms below the knee.    Time 4    Period Weeks    Status Achieved    Target Date 09/08/20      PT SHORT TERM GOAL #3   Target Date 09/02/18             PT Long Term Goals - 08/31/20 1056      PT LONG TERM GOAL #1   Title Felicia [name] will score 55 on the FOTO Functional Assessment at DC.    Baseline 42 at evaluation, 52 at 08/31/20    Time 8    Period Weeks    Status On-going  PT LONG TERM GOAL #2   Title Felicia [name] will report no L LE symptoms for > 1 week.    Baseline L LE symptoms to foot.    Time 8    Period Weeks    Status Partially Met      PT LONG TERM GOAL #3    Title Felicia [name] will be able to walk a mile without low back pain or radicular pain at DC.    Baseline Up to 20 minutes without increase in pain, just fatigue.    Time 8    Period Weeks    Status On-going      PT LONG TERM GOAL #4   Title Felicia [name] will be independent with her long-term HEP at DC.    Time 8    Period Weeks    Status On-going                 Plan - 08/31/20 1337    Clinical Impression Statement Felicia [name] is making excellent early progress.  L leg symptoms are gluteal and hip (were to the foot).  Weakness (core, hip and L leg) appear to be in need of continued work as walking, pain and overall function have improved but not quite to acceptable levels.  With 2-4 weeks of additional work (as suggested at evaluation), Felicia [name] should meet LTGs and be ready for independent rehabilitation.    Personal Factors and Comorbidities Comorbidity 2    Comorbidities Neuropathy and RA    Examination-Activity Limitations Sit;Sleep;Bed Mobility;Bend;Lift    Examination-Participation Restrictions Community Activity    Stability/Clinical Decision Making Stable/Uncomplicated    Clinical Decision Making Low    Rehab Potential Good    PT Frequency 2x / week    PT Duration 4 weeks    PT Treatment/Interventions ADLs/Self Care Home Management;Cryotherapy;Electrical Stimulation;Moist Heat;Traction;Therapeutic exercise;Balance training;Therapeutic activities;Manual techniques;Patient/family education;Neuromuscular re-education;Dry needling    PT Next Visit Plan Body mechanics and postural education, leg strengthening, balance and continued postural and spine strength work (careful with knee OA)    PT Home Exercise Plan Access Code: [email or web address]    Consulted and Agree with Plan of Care Patient           Patient will benefit from skilled therapeutic intervention in order to improve the following deficits and impairments:  Decreased endurance,Decreased activity tolerance,Pain,Postural dysfunction,Improper body  mechanics,Decreased strength,Decreased balance,Decreased range of motion,Difficulty walking,Impaired flexibility  Visit Diagnosis: Difficulty walking  Abnormal posture  Muscle weakness (generalized)  Radiculopathy, lumbar region     Problem List Patient Active Problem List   Diagnosis Date Noted  . Neuropathy 06/16/2020  . SIADH (syndrome of inappropriate ADH production) ([place]) 02/18/2020  . Hyponatremia 11/18/2019  . Thrombocytosis 08/01/2018  . URI (upper respiratory infection) 04/14/2017  . Primary osteoarthritis of both knees 03/06/2017  . Exertional chest pain   . Pain in the chest 03/27/2015  . Postural hypotension 05/26/2013  . OSA (obstructive sleep apnea) 03/22/2013  . Osteoarthritis 11/07/2011  . Abdominal bruit 10/16/2011  . HTN (hypertension) 08/13/2011  . Anisocoria 08/13/2011  . Cervical radiculopathy 07/17/2011  . DDD (degenerative disc disease), cervical 07/10/2011  . Glaucoma 06/06/2011  . HIATAL HERNIA 01/30/2010  . POSTMENOPAUSAL SYNDROME 12/14/2009  . EUSTACHIAN TUBE DYSFUNCTION, RIGHT 06/28/2009  . GERD 12/07/2008  . Rheumatoid arthritis ([place]) 12/07/2008  . COLONIC POLYPS, HX OF 12/07/2008  . Hyperlipidemia LDL goal <70 10/17/2006  . HYPERCALCEMIA 10/17/2006  . Mitral valve disease 10/17/2006    Farley Ly PT, MPT 08/31/2020, 1:39 PM  [facility] Physical Therapy [address] [place], [place], [address] Phone: [phone]   Fax:  [phone]  Name: Felicia [name] MRN: [id number] Date of Birth: April 15, 1941

## 2020-09-01 ENCOUNTER — Telehealth: Payer: Self-pay | Admitting: Pharmacist

## 2020-09-01 MED ORDER — RINVOQ 15 MG PO TB24: 15.0000 mg | ORAL_TABLET | Freq: Every day | ORAL | 2 refills | Status: DC

## 2020-09-01 NOTE — Telephone Encounter (Signed)
Patient requested rx for Rinvoq be sent to [place]. Her last Orencia dose will be 09/12/20. She will start Rinvoq on 09/19/20. She was able to read back to me the exact date and has it marked in her calendar

## 2020-09-01 NOTE — Telephone Encounter (Signed)
Patient requested rx for Rinvoq be sent to [place]. Her last Orencia dose will be 09/12/20. She will start Rinvoq on 09/19/20. She was able to read back to me the exact date and has it marked in her calendar  Knox Saliva, PharmD, MPH Clinical Pharmacist (Rheumatology and Pulmonology)

## 2020-09-04 ENCOUNTER — Other Ambulatory Visit: Payer: Self-pay

## 2020-09-04 ENCOUNTER — Ambulatory Visit: Payer: Medicare PPO | Admitting: Physical Therapy

## 2020-09-04 ENCOUNTER — Encounter: Payer: Self-pay | Admitting: Physical Therapy

## 2020-09-04 ENCOUNTER — Telehealth: Payer: Self-pay | Admitting: Rheumatology

## 2020-09-04 ENCOUNTER — Other Ambulatory Visit: Payer: Self-pay | Admitting: Rheumatology

## 2020-09-04 DIAGNOSIS — R262 Difficulty in walking, not elsewhere classified: Secondary | ICD-10-CM | POA: Diagnosis not present

## 2020-09-04 DIAGNOSIS — R293 Abnormal posture: Secondary | ICD-10-CM | POA: Diagnosis not present

## 2020-09-04 DIAGNOSIS — M6281 Muscle weakness (generalized): Secondary | ICD-10-CM

## 2020-09-04 DIAGNOSIS — M5416 Radiculopathy, lumbar region: Secondary | ICD-10-CM | POA: Diagnosis not present

## 2020-09-04 NOTE — Therapy (Signed)
[facility] [address] [place], [place], [address] Phone: [phone]   Fax:  [phone]  Physical Therapy Treatment  Patient Details  Name: Felicia [name] MRN: [id number] Date of Birth: [birth year] Referring Provider (PT): Magnus Sinning MD   Encounter Date: 09/04/2020   PT End of Session - 09/04/20 1017    Visit Number 5    Number of Visits 16    Date for PT Re-Evaluation 09/29/20    Authorization Type MCR: [id number] mod by 15th visit, progress note at 10th     PT Start Time 1010    PT Stop Time 1049    PT Time Calculation (min) 39 min    Activity Tolerance Patient tolerated treatment well;No increased pain    Behavior During Therapy WFL for tasks assessed/performed           Past Medical History:  Diagnosis Date  . Adenomatous colon polyp 2004   Colo in 2009 "no polyps"  . Complication of anesthesia    small airway per patient  . GERD (gastroesophageal reflux disease)   . Glaucoma   . Hiatal hernia   . Hypertension   . Neuropathy   . Rheumatoid arthritis(714.0)    [place]; Methotrexate (Dr. Patrecia Pour)  . Sleep apnea    on CPAP    Past Surgical History:  Procedure Laterality Date  . CARDIAC CATHETERIZATION N/A 03/29/2015   Procedure: Left Heart Cath and Coronary Angiography;  Surgeon: Belva Crome, MD;  Location: [facility];  Service: Cardiovascular;  Laterality: N/A;  . CATARACT EXTRACTION, BILATERAL    . COLONOSCOPY W/ POLYPECTOMY     X1; negative subsequently  . DILATION AND CURETTAGE OF UTERUS    . EYE SURGERY     due to RA  . EYE SURGERY Left 06/2017   eyelid tendon   . HYSTEROSCOPY WITH D & C  06/24/2012   Procedure: DILATATION AND CURETTAGE /HYSTEROSCOPY;  Surgeon: [name], MD;  Location: [facility];  Service: Gynecology;  Laterality: N/A;  . SHOULDER SURGERY Left    due to RA  . TRABECULECTOMY     X 2 OD; X 1 OS  . UPPER GASTROINTESTINAL ENDOSCOPY     hiatal hernia    There were no vitals filed  for this visit.   Subjective Assessment - 09/04/20 1011    Subjective Pt arriving today reporting 2/10 pain in Left leg and gluteals.Pt reporting overall improvements with some mild soreness following her last PT session which lasted for a couple of hours.    Pertinent History RA    Limitations Sitting    How long can you sit comfortably? < 30 minutes    How long can you stand comfortably? 30 min     How long can you walk comfortably? Has not been walking for exercise    Diagnostic tests 03/10/2018 MRI,     Patient Stated Goals Return to walking for exercise without L leg or low back pain    Currently in Pain? Yes    Pain Score 2     Pain Location --   left leg and gluteals   Pain Orientation Left    Pain Descriptors / Indicators Tightness;Sore    Pain Type Chronic pain    Pain Onset More than a month ago    Pain Frequency Intermittent  [facility] Adult PT Treatment/Exercise - 09/04/20 0001      Exercises   Exercises Lumbar      Lumbar Exercises: Stretches   Prone on Elbows Stretch 20 seconds;3 reps    Prone on Elbows Stretch Limitations deep breathing instructions to relax and perform deep breahting    Piriformis Stretch 3 reps;20 seconds    Figure 4 Stretch 20 seconds;With overpressure;4 reps      Lumbar Exercises: Standing   Scapular Retraction Strengthening;Both;15 reps   hold 5 seconds each   Other Standing Lumbar Exercises Trunk extension AROM 10X 3 seconds    Other Standing Lumbar Exercises hip hiking in door way x 10 holding 3 second each, standing hip extension      Lumbar Exercises: Prone   Straight Leg Raise 10 reps;3 seconds;Limitations   2 sets   Straight Leg Raises Limitations Knees on pillow                    PT Short Term Goals - 09/04/20 1023      PT SHORT TERM GOAL #1   Status Achieved      PT SHORT TERM GOAL #2   Title pt to verbalize/ demo proper posture and lifting mechanics to prevent and reduce  hip/back pain    Status On-going      PT SHORT TERM GOAL #3   Title improve Berg balance to >/= 42/56 to demo functional balance progression     Status On-going             PT Long Term Goals - 09/04/20 1023      PT LONG TERM GOAL #1   Title Sophiah will score 55 on the FOTO Functional Assessment at DC.    Status On-going      PT LONG TERM GOAL #2   Title Karis will report no L LE symptoms for > 1 week.    Status On-going      PT LONG TERM GOAL #3   Title Madalyne will be able to walk a mile without low back pain or radicular pain at DC.    Status On-going      PT LONG TERM GOAL #4   Title Jeronda will be independent with her long-term HEP at DC.    Status On-going                 Plan - 09/04/20 1017    Clinical Impression Statement Pt is making progress toward LTG's. Pt reporting 2/10 left leg pain and gluteal pain. Still progress with strengthening in her left hip and L LE. Pt still amb with mild antalgic gait. Pt with weakness noted in left hip musculature. Continue skilled PT to progress toward discharge goals.    Personal Factors and Comorbidities Comorbidity 2    Comorbidities Neuropathy and RA    Examination-Activity Limitations Sit;Sleep;Bed Mobility;Bend;Lift    Examination-Participation Restrictions Community Activity    PT Frequency 2x / week    PT Duration 4 weeks    PT Treatment/Interventions ADLs/Self Care Home Management;Cryotherapy;Electrical Stimulation;Moist Heat;Traction;Therapeutic exercise;Balance training;Therapeutic activities;Manual techniques;Patient/family education;Neuromuscular re-education;Dry needling    PT Next Visit Plan Body mechanics and postural education, leg strengthening, balance and continued postural and spine strength work (careful with knee OA)    PT Home Exercise Plan Access Code: [email or web address]    Consulted and Agree with Plan of Care Patient           Patient will benefit from skilled therapeutic intervention in order to  improve the  following deficits and impairments:  Decreased endurance,Decreased activity tolerance,Pain,Postural dysfunction,Improper body mechanics,Decreased strength,Decreased balance,Decreased range of motion,Difficulty walking,Impaired flexibility  Visit Diagnosis: Difficulty walking  Abnormal posture  Muscle weakness (generalized)  Radiculopathy, lumbar region     Problem List Patient Active Problem List   Diagnosis Date Noted  . Neuropathy 06/16/2020  . SIADH (syndrome of inappropriate ADH production) ([place]) 02/18/2020  . Hyponatremia 11/18/2019  . Thrombocytosis 08/01/2018  . URI (upper respiratory infection) 04/14/2017  . Primary osteoarthritis of both knees 03/06/2017  . Exertional chest pain   . Pain in the chest 03/27/2015  . Postural hypotension 05/26/2013  . OSA (obstructive sleep apnea) 03/22/2013  . Osteoarthritis 11/07/2011  . Abdominal bruit 10/16/2011  . HTN (hypertension) 08/13/2011  . Anisocoria 08/13/2011  . Cervical radiculopathy 07/17/2011  . DDD (degenerative disc disease), cervical 07/10/2011  . Glaucoma 06/06/2011  . HIATAL HERNIA 01/30/2010  . POSTMENOPAUSAL SYNDROME 12/14/2009  . EUSTACHIAN TUBE DYSFUNCTION, RIGHT 06/28/2009  . GERD 12/07/2008  . Rheumatoid arthritis ([place]) 12/07/2008  . COLONIC POLYPS, HX OF 12/07/2008  . Hyperlipidemia LDL goal <70 10/17/2006  . HYPERCALCEMIA 10/17/2006  . Mitral valve disease 10/17/2006    Oretha Caprice PT, MPT 09/04/2020, 10:34 AM  [facility] [address] [place], [place], [address] Phone: [phone]   Fax:  [phone]  Name: ENOLA SIEBERS MRN: [id number] Date of Birth: [birth year]

## 2020-09-04 NOTE — Telephone Encounter (Signed)
Patient calling in reference to MTX vials. Rx changed, and she did not get as many vials as she normally does. Please call to discuss.

## 2020-09-04 NOTE — Telephone Encounter (Signed)
I called patient, patient only received 2 vials, patient should have gotten 5 vials, patient will call pharmacy, RX for 90 day supply sent in March, 2022.

## 2020-09-05 NOTE — Procedures (Signed)
Lumbosacral Transforaminal Epidural Steroid Injection - Sub-Pedicular Approach with Fluoroscopic Guidance  Patient: Felicia [name]      Date of Birth: [birth year] MRN: [id number] PCP: Kathyrn Lass, MD      Visit Date: 07/31/2020   Universal Protocol:    Date/Time: 07/31/2020  Consent Given By: the patient  Position: PRONE  Additional Comments: Vital signs were monitored before and after the procedure. Patient was prepped and draped in the usual sterile fashion. The correct patient, procedure, and site was verified.   Injection Procedure Details:   Procedure diagnoses: Lumbar radiculopathy [M54.16]    Meds Administered:  Meds ordered this encounter  Medications  . methylPREDNISolone acetate (DEPO-MEDROL) injection 80 mg    Laterality: Left  Location/Site:  L4-L5  Needle:5.0 in., 22 ga.  Short bevel or Quincke spinal needle  Needle Placement: Transforaminal  Findings:    -Comments: Excellent flow of contrast along the nerve, nerve root and into the epidural space.  Procedure Details: After squaring off the end-plates to get a true AP view, the C-arm was positioned so that an oblique view of the foramen as noted above was visualized. The target area is just inferior to the "nose of the scotty dog" or sub pedicular. The soft tissues overlying this structure were infiltrated with 2-3 ml. of 1% Lidocaine without Epinephrine.  The spinal needle was inserted toward the target using a "trajectory" view along the fluoroscope beam.  Under AP and lateral visualization, the needle was advanced so it did not puncture dura and was located close the 6 O'Clock position of the pedical in AP tracterory. Biplanar projections were used to confirm position. Aspiration was confirmed to be negative for CSF and/or blood. A 1-2 ml. volume of Isovue-250 was injected and flow of contrast was noted at each level. Radiographs were obtained for documentation purposes.   After attaining the desired flow  of contrast documented above, a 0.5 to 1.0 ml test dose of 0.25% Marcaine was injected into each respective transforaminal space.  The patient was observed for 90 seconds post injection.  After no sensory deficits were reported, and normal lower extremity motor function was noted,   the above injectate was administered so that equal amounts of the injectate were placed at each foramen (level) into the transforaminal epidural space.   Additional Comments:  The patient tolerated the procedure well Dressing: 2 x 2 sterile gauze and Band-Aid    Post-procedure details: Patient was observed during the procedure. Post-procedure instructions were reviewed.  Patient left the clinic in stable condition.

## 2020-09-05 NOTE — Progress Notes (Signed)
Felicia [name] - 80 y.o. female MRN [id number]  Date of birth: Jun 17, 1940  Office Visit Note: Visit Date: 07/31/2020 PCP: Kathyrn Lass, MD Referred by: Kathyrn Lass, MD  Subjective: Chief Complaint  Patient presents with  . Lower Back - Pain   HPI:  Felicia [name] is a 80 y.o. female who comes in today for planned repeat Left L4-L5 Lumbar epidural steroid injection with fluoroscopic guidance.  The patient has failed conservative care including home exercise, medications, time and activity modification.  This injection will be diagnostic and hopefully therapeutic.  Since we first saw her I did order MRI of the lumbar spine and this is reviewed with spine models and imaging with her today.  This shows left paracentral disc extrusion at L2 4-5 likely impacting the L4 and L5 nerve roots.  Please see requesting physician notes for further details and justification. Prior injection did offer more than 50% relief as noted above and did give the patient more functional ability for activities of daily living and was also beneficial in that it did reduce their medication requirement.  They have had physical therapy and continue with home exercises.  Current medication management is not beneficial in increasing their functional status.  Procedures are done as part of a comprehensive orthopedic and pain management program with access to in-house orthopedics, spine surgery and physical therapy as well as access to [place] biopsychosocial counseling if needed.  Referring: Dr. Bo Merino   ROS Otherwise per HPI.  Assessment & Plan: Visit Diagnoses:    ICD-10-CM   1. Lumbar radiculopathy  M54.16 XR C-ARM NO REPORT    Epidural Steroid injection    methylPREDNISolone acetate (DEPO-MEDROL) injection 80 mg    Ambulatory referral to Physical Therapy  2. Radiculopathy due to lumbar intervertebral disc disorder  M51.16 XR C-ARM NO REPORT    Epidural Steroid injection    methylPREDNISolone  acetate (DEPO-MEDROL) injection 80 mg    Ambulatory referral to Physical Therapy    Plan: No additional findings.   Meds & Orders:  Meds ordered this encounter  Medications  . methylPREDNISolone acetate (DEPO-MEDROL) injection 80 mg    Orders Placed This Encounter  Procedures  . XR C-ARM NO REPORT  . Ambulatory referral to Physical Therapy  . Epidural Steroid injection    Follow-up: Return if symptoms worsen or fail to improve.   Procedures: No procedures performed  Lumbosacral Transforaminal Epidural Steroid Injection - Sub-Pedicular Approach with Fluoroscopic Guidance  Patient: Felicia [name]      Date of Birth: 05-29-1941 MRN: [id number] PCP: Kathyrn Lass, MD      Visit Date: 07/31/2020   Universal Protocol:    Date/Time: 07/31/2020  Consent Given By: the patient  Position: PRONE  Additional Comments: Vital signs were monitored before and after the procedure. Patient was prepped and draped in the usual sterile fashion. The correct patient, procedure, and site was verified.   Injection Procedure Details:   Procedure diagnoses: Lumbar radiculopathy [M54.16]    Meds Administered:  Meds ordered this encounter  Medications  . methylPREDNISolone acetate (DEPO-MEDROL) injection 80 mg    Laterality: Left  Location/Site:  L4-L5  Needle:5.0 in., 22 ga.  Short bevel or Quincke spinal needle  Needle Placement: Transforaminal  Findings:    -Comments: Excellent flow of contrast along the nerve, nerve root and into the epidural space.  Procedure Details: After squaring off the end-plates to get a true AP view, the C-arm was positioned so that an  oblique view of the foramen as noted above was visualized. The target area is just inferior to the "nose of the scotty dog" or sub pedicular. The soft tissues overlying this structure were infiltrated with 2-3 ml. of 1% Lidocaine without Epinephrine.  The spinal needle was inserted toward the target using a "trajectory" view  along the fluoroscope beam.  Under AP and lateral visualization, the needle was advanced so it did not puncture dura and was located close the 6 O'Clock position of the pedical in AP tracterory. Biplanar projections were used to confirm position. Aspiration was confirmed to be negative for CSF and/or blood. A 1-2 ml. volume of Isovue-250 was injected and flow of contrast was noted at each level. Radiographs were obtained for documentation purposes.   After attaining the desired flow of contrast documented above, a 0.5 to 1.0 ml test dose of 0.25% Marcaine was injected into each respective transforaminal space.  The patient was observed for 90 seconds post injection.  After no sensory deficits were reported, and normal lower extremity motor function was noted,   the above injectate was administered so that equal amounts of the injectate were placed at each foramen (level) into the transforaminal epidural space.   Additional Comments:  The patient tolerated the procedure well Dressing: 2 x 2 sterile gauze and Band-Aid    Post-procedure details: Patient was observed during the procedure. Post-procedure instructions were reviewed.  Patient left the clinic in stable condition.      Clinical History: MRI LUMBAR SPINE WITHOUT CONTRAST  TECHNIQUE: Multiplanar, multisequence MR imaging of the lumbar spine was performed. No intravenous contrast was administered.  COMPARISON:  Radiography 05/24/2019  FINDINGS: Segmentation:  5 lumbar type vertebrae based on prior radiography  Alignment: Dextroscoliosis. Grade 1 anterolisthesis at L3-4 and L4-5.  Vertebrae: Mild discogenic marrow signal at L2-3, L4-5, and L5-S1. Mild left facet marrow edema at L4-5.  Conus medullaris and cauda equina: Conus extends to the L1-2 level. Conus and cauda equina appear normal.  Paraspinal and other soft tissues: Negative for perispinal mass or inflammation. There is a persistent hypointensity at the  level of the minimally covered uterus suggesting fibroid.  Disc levels:  T12- L1: Unremarkable.  L1-L2: Unremarkable.  L2-L3: Disc collapse asymmetric to the left with endplate ridging and disc bulging. Mild left facet spurring.  L3-L4: Disc narrowing and bulging with mild facet spurring.  L4-L5: Facet osteoarthritis with spurring and mild anterolisthesis. Disc space narrowing and bulging with superiorly migrating left paracentral extrusion reaching the upper half of the L4 vertebral body and impinging on the descending L4 nerve root. Disc material and facet spurring also impinges on the descending left L5 nerve root.  L5-S1:Disc narrowing and mild bulging. Minor facet spurring. No neural compression.  IMPRESSION: 1. Multilevel disc and facet degeneration with levoscoliosis and L3-4, L4-5 anterolisthesis. 2. L4-5 left paracentral extrusion with superior migration and impingement on the left L4 and L5 nerve roots. No other neural impingement, suspect this is the symptomatic finding. 3. Active facet arthritis on the left at L4-5.   Electronically Signed   By: Monte Fantasia M.D.   On: 07/19/2020  ----  03/26/18 EMG/NCS Dr. Leta Baptist -Axonal sensorimotor polyneuropathy. Autoimmune versus idiopathic.     Objective:  VS:  HT:    WT:   BMI:     BP:133/74  HR:78bpm  TEMP: ( )  RESP:  Physical Exam Vitals and nursing note reviewed.  Constitutional:      General: She is not in acute  distress.    Appearance: Normal appearance. She is not ill-appearing.  HENT:     Head: Normocephalic and atraumatic.     Right Ear: External ear normal.     Left Ear: External ear normal.  Eyes:     Extraocular Movements: Extraocular movements intact.  Cardiovascular:     Rate and Rhythm: Normal rate.     Pulses: Normal pulses.  Pulmonary:     Effort: Pulmonary effort is normal. No respiratory distress.  Abdominal:     General: There is no distension.     Palpations:  Abdomen is soft.  Musculoskeletal:        General: Tenderness present.     Cervical back: Neck supple.     Right lower leg: No edema.     Left lower leg: No edema.     Comments: Patient has good distal strength with no pain over the greater trochanters.  No clonus or focal weakness.  Skin:    Findings: No erythema, lesion or rash.  Neurological:     General: No focal deficit present.     Mental Status: She is alert and oriented to person, place, and time.     Sensory: No sensory deficit.     Motor: No weakness or abnormal muscle tone.     Coordination: Coordination normal.  Psychiatric:        Mood and Affect: Mood normal.        Behavior: Behavior normal.      Imaging: No results found.

## 2020-09-06 ENCOUNTER — Ambulatory Visit: Payer: Medicare PPO | Admitting: Physical Therapy

## 2020-09-06 ENCOUNTER — Other Ambulatory Visit: Payer: Self-pay

## 2020-09-06 DIAGNOSIS — M6281 Muscle weakness (generalized): Secondary | ICD-10-CM

## 2020-09-06 DIAGNOSIS — R293 Abnormal posture: Secondary | ICD-10-CM

## 2020-09-06 DIAGNOSIS — R262 Difficulty in walking, not elsewhere classified: Secondary | ICD-10-CM | POA: Diagnosis not present

## 2020-09-06 DIAGNOSIS — M5416 Radiculopathy, lumbar region: Secondary | ICD-10-CM

## 2020-09-06 NOTE — Therapy (Signed)
[facility] Therapy [address] [place], [place], [address] Phone: [phone]   Fax:  [phone]  Physical Therapy Treatment  Patient Details  Name: Felicia [name] MRN: [id number] Date of Birth: [birth year] Referring Provider (PT): Magnus Sinning MD   Encounter Date: 09/06/2020   PT End of Session - 09/06/20 1024    Visit Number 6    Number of Visits 16    Date for PT Re-Evaluation 09/29/20    Authorization Type MCR: kx mod by 15th visit, progress note at 10th     PT Start Time 1015    PT Stop Time 1055    PT Time Calculation (min) 40 min    Activity Tolerance Patient tolerated treatment well;No increased pain    Behavior During Therapy WFL for tasks assessed/performed           Past Medical History:  Diagnosis Date  . Adenomatous colon polyp 2004   Colo in 2009 "no polyps"  . Complication of anesthesia    small airway per patient  . GERD (gastroesophageal reflux disease)   . Glaucoma   . Hiatal hernia   . Hypertension   . Neuropathy   . Rheumatoid arthritis(714.0)    [place]cia; Methotrexate (Dr. Patrecia Pour)  . Sleep apnea    on CPAP    Past Surgical History:  Procedure Laterality Date  . CARDIAC CATHETERIZATION N/A 03/29/2015   Procedure: Left Heart Cath and Coronary Angiography;  Surgeon: Belva Crome, MD;  Location: [facility];  Service: Cardiovascular;  Laterality: N/A;  . CATARACT EXTRACTION, BILATERAL    . COLONOSCOPY W/ POLYPECTOMY     X1; negative subsequently  . DILATION AND CURETTAGE OF UTERUS    . EYE SURGERY     due to RA  . EYE SURGERY Left 06/2017   eyelid tendon   . HYSTEROSCOPY WITH D & C  06/24/2012   Procedure: DILATATION AND CURETTAGE /HYSTEROSCOPY;  Surgeon: [name], MD;  Location: [facility];  Service: Gynecology;  Laterality: N/A;  . SHOULDER SURGERY Left    due to RA  . TRABECULECTOMY     X 2 OD; X 1 OS  . UPPER GASTROINTESTINAL ENDOSCOPY     hiatal hernia    There were no vitals filed  for this visit.   Subjective Assessment - 09/06/20 1022    Subjective Pt arriving today reporting 4/10 pain in left leg and glutes, Pt stated she started Rinvoq medicine this morning and she is concerned with all the side effects.    Pertinent History RA    Limitations Sitting    How long can you sit comfortably? < 30 minutes    How long can you stand comfortably? 30 min     How long can you walk comfortably? Has not been walking for exercise    Diagnostic tests 03/10/2018 MRI,     Patient Stated Goals Return to walking for exercise without L leg or low back pain    Currently in Pain? Yes    Pain Score 4     Pain Orientation Left    Pain Descriptors / Indicators Sore    Pain Type Chronic pain    Pain Onset More than a month ago    Pain Frequency Intermittent                             [facility] Adult PT Treatment/Exercise - 09/06/20 0001  Exercises   Exercises Lumbar      Lumbar Exercises: Stretches   Prone on Elbows Stretch 20 seconds;3 reps    Prone on Elbows Stretch Limitations deep breathing instructions to relax and perform deep breahting    Press Ups 5 reps;5 seconds    Piriformis Stretch 3 reps;20 seconds    Figure 4 Stretch 20 seconds;With overpressure;4 reps      Lumbar Exercises: Standing   Scapular Retraction --   hold 5 seconds each   Row Strengthening;Both;15 reps;Theraband    Theraband Level (Row) Level 3 (Green)    Other Standing Lumbar Exercises trunk extension elbows on the wall x 5 holding 10 seconds      Lumbar Exercises: Prone   Straight Leg Raise 10 reps;3 seconds;Limitations   2 sets   Straight Leg Raises Limitations Knees on pillow      Knee/Hip Exercises: Seated   Other Seated Knee/Hip Exercises trunk rotation with arm at 90 degrees and elbows etended x 3 to each side                    PT Short Term Goals - 09/06/20 1040      PT SHORT TERM GOAL #1   Title Felicia [name] will report no L LE radicular symptoms below the knee  in > 1 week.    Baseline Symptoms below the knee.    Status On-going      PT SHORT TERM GOAL #2   Title pt to verbalize/ demo proper posture and lifting mechanics to prevent and reduce hip/back pain    Status On-going      PT SHORT TERM GOAL #3   Title improve Berg balance to >/= 42/56 to demo functional balance progression     Status On-going             PT Long Term Goals - 09/06/20 1051      PT LONG TERM GOAL #1   Title Felicia [name] will score 55 on the FOTO Functional Assessment at DC.    Status On-going      PT LONG TERM GOAL #2   Title Felicia [name] will report no L LE symptoms for > 1 week.    Status On-going      PT LONG TERM GOAL #3   Title Felicia [name] will be able to walk a mile without low back pain or radicular pain at DC.    Status On-going      PT LONG TERM GOAL #4   Title Felicia [name] will be independent with her long-term HEP at DC.    Status On-going                 Plan - 09/06/20 1026    Clinical Impression Statement Pt arriving today reporting 4/10 pain in left leg and guteals. Pt progressing with strengthening and muscle endurance. Pt still with mild antalgic gait pattern, working on increased step length and heel strike on the LLE. Continue skilled PT.    Personal Factors and Comorbidities Comorbidity 2    Comorbidities Neuropathy and RA    Examination-Activity Limitations Sit;Sleep;Bed Mobility;Bend;Lift    Examination-Participation Restrictions Community Activity    Stability/Clinical Decision Making Stable/Uncomplicated    Rehab Potential Good    PT Frequency 2x / week    PT Duration 4 weeks    PT Treatment/Interventions ADLs/Self Care Home Management;Cryotherapy;Electrical Stimulation;Moist Heat;Traction;Therapeutic exercise;Balance training;Therapeutic activities;Manual techniques;Patient/family education;Neuromuscular re-education;Dry needling    PT Next Visit Plan Body mechanics and postural education, leg strengthening,  balance and continued postural and spine  strength work (careful with knee OA)    PT Home Exercise Plan Access Code: [email or web address]    Consulted and Agree with Plan of Care Patient           Patient will benefit from skilled therapeutic intervention in order to improve the following deficits and impairments:  Decreased endurance,Decreased activity tolerance,Pain,Postural dysfunction,Improper body mechanics,Decreased strength,Decreased balance,Decreased range of motion,Difficulty walking,Impaired flexibility  Visit Diagnosis: Difficulty walking  Abnormal posture  Muscle weakness (generalized)  Radiculopathy, lumbar region     Problem List Patient Active Problem List   Diagnosis Date Noted  . Neuropathy 06/16/2020  . SIADH (syndrome of inappropriate ADH production) ([place]) 02/18/2020  . Hyponatremia 11/18/2019  . Thrombocytosis 08/01/2018  . URI (upper respiratory infection) 04/14/2017  . Primary osteoarthritis of both knees 03/06/2017  . Exertional chest pain   . Pain in the chest 03/27/2015  . Postural hypotension 05/26/2013  . OSA (obstructive sleep apnea) 03/22/2013  . Osteoarthritis 11/07/2011  . Abdominal bruit 10/16/2011  . HTN (hypertension) 08/13/2011  . Anisocoria 08/13/2011  . Cervical radiculopathy 07/17/2011  . DDD (degenerative disc disease), cervical 07/10/2011  . Glaucoma 06/06/2011  . HIATAL HERNIA 01/30/2010  . POSTMENOPAUSAL SYNDROME 12/14/2009  . EUSTACHIAN TUBE DYSFUNCTION, RIGHT 06/28/2009  . GERD 12/07/2008  . Rheumatoid arthritis ([place]) 12/07/2008  . COLONIC POLYPS, HX OF 12/07/2008  . Hyperlipidemia LDL goal <70 10/17/2006  . HYPERCALCEMIA 10/17/2006  . Mitral valve disease 10/17/2006    Oretha Caprice, PT, MPT 09/06/2020, 11:09 AM  [facility] Physical Therapy [address] [place], [place], [address] Phone: [phone]   Fax:  [phone]  Name: Felicia [name] MRN: [id number] Date of Birth: 1940-09-29

## 2020-09-08 ENCOUNTER — Ambulatory Visit (HOSPITAL_COMMUNITY): Payer: Medicare PPO

## 2020-09-11 ENCOUNTER — Encounter: Payer: Self-pay | Admitting: Physical Therapy

## 2020-09-11 ENCOUNTER — Ambulatory Visit: Payer: Medicare PPO | Admitting: Physical Therapy

## 2020-09-11 ENCOUNTER — Other Ambulatory Visit: Payer: Self-pay

## 2020-09-11 DIAGNOSIS — R293 Abnormal posture: Secondary | ICD-10-CM | POA: Diagnosis not present

## 2020-09-11 DIAGNOSIS — R262 Difficulty in walking, not elsewhere classified: Secondary | ICD-10-CM | POA: Diagnosis not present

## 2020-09-11 DIAGNOSIS — M6281 Muscle weakness (generalized): Secondary | ICD-10-CM

## 2020-09-11 DIAGNOSIS — M5416 Radiculopathy, lumbar region: Secondary | ICD-10-CM | POA: Diagnosis not present

## 2020-09-11 NOTE — Therapy (Signed)
[facility] Physical Therapy [address] [place], [place], [address] Phone: [phone]   Fax:  [phone]  Physical Therapy Treatment  Patient Details  Name: Felicia [name] MRN: [id number] Date of Birth: 23-Sep-1940 Referring Provider (PT): Magnus Sinning MD   Encounter Date: 09/11/2020   PT End of Session - 09/11/20 1039    Visit Number 7    Number of Visits 16    Date for PT Re-Evaluation 09/29/20    Authorization Type MCR: kx mod by 15th visit, progress note at 10th     PT Start Time 1015    PT Stop Time 1055    PT Time Calculation (min) 40 min    Activity Tolerance Patient tolerated treatment well;No increased pain    Behavior During Therapy WFL for tasks assessed/performed           Past Medical History:  Diagnosis Date  . Adenomatous colon polyp 2004   Colo in 2009 "no polyps"  . Complication of anesthesia    small airway per patient  . GERD (gastroesophageal reflux disease)   . Glaucoma   . Hiatal hernia   . Hypertension   . Neuropathy   . Rheumatoid arthritis(714.0)    [place]rencia; Methotrexate (Dr. Patrecia Pour)  . Sleep apnea    on CPAP    Past Surgical History:  Procedure Laterality Date  . CARDIAC CATHETERIZATION N/A 03/29/2015   Procedure: Left Heart Cath and Coronary Angiography;  Surgeon: Belva Crome, MD;  Location: [facility];  Service: Cardiovascular;  Laterality: N/A;  . CATARACT EXTRACTION, BILATERAL    . COLONOSCOPY W/ POLYPECTOMY     X1; negative subsequently  . DILATION AND CURETTAGE OF UTERUS    . EYE SURGERY     due to RA  . EYE SURGERY Left 06/2017   eyelid tendon   . HYSTEROSCOPY WITH D & C  06/24/2012   Procedure: DILATATION AND CURETTAGE /HYSTEROSCOPY;  Surgeon: [name], MD;  Location: [facility];  Service: Gynecology;  Laterality: N/A;  . SHOULDER SURGERY Left    due to RA  . TRABECULECTOMY     X 2 OD; X 1 OS  . UPPER GASTROINTESTINAL ENDOSCOPY     hiatal hernia    There were no vitals filed  for this visit.   Subjective Assessment - 09/11/20 1036    Subjective Pt arriving today reporting 3/10 pain in left leg/glutes. Pt reporting resting more yesterday.    Pertinent History RA    Limitations Sitting    How long can you sit comfortably? < 30 minutes    How long can you stand comfortably? 30 min     How long can you walk comfortably? Has not been walking for exercise    Diagnostic tests 03/10/2018 MRI,     Patient Stated Goals Return to walking for exercise without L leg or low back pain    Currently in Pain? Yes    Pain Score 3     Pain Location --   left leg, glutes   Pain Orientation Left    Pain Descriptors / Indicators Sore    Pain Type Chronic pain    Pain Onset More than a month ago    Pain Frequency Intermittent                             [facility] Adult PT Treatment/Exercise - 09/11/20 0001      Ambulation/Gait   Gait  Comments working on level surfaces heel to toe gait pattern with increased velocity and step length.      Exercises   Exercises Lumbar      Lumbar Exercises: Stretches   Prone on Elbows Stretch 20 seconds;3 reps    Prone on Elbows Stretch Limitations deep breathing instructions to relax and perform deep breahting    Press Ups 5 reps;5 seconds    Piriformis Stretch 3 reps;20 seconds    Figure 4 Stretch 20 seconds;With overpressure;4 reps      Lumbar Exercises: Standing   Row Strengthening;Both;15 reps;Theraband    Theraband Level (Row) Level 3 (Green)    Other Standing Lumbar Exercises trunk extension elbows on the wall x 5 holding 10 seconds    Other Standing Lumbar Exercises hip hiking on 4 inch step 2x10 holding 3 seconds each      Lumbar Exercises: Prone   Straight Leg Raise 10 reps;3 seconds;Limitations   2 sets   Straight Leg Raises Limitations Knees on pillow      Lumbar Exercises: Quadruped   [place] Limitations 10 reps hodling 5 seconds   difficulty with trunk dissociation and pelvic tilts   Straight Leg  Raise 3 seconds;5 reps    Straight Leg Raises Limitations needed contant verbal and tacile cues to keep pelvis neutral      Knee/Hip Exercises: Seated   Other Seated Knee/Hip Exercises trunk rotation x  5 to each side                  PT Education - 09/11/20 1039    Education Details education on exercise technique and form with quadraped exercises    Person(s) Educated Patient    Methods Explanation;Demonstration;Verbal cues;Tactile cues    Comprehension Returned demonstration;Need further instruction;Verbal cues required;Tactile cues required;Verbalized understanding            PT Short Term Goals - 09/06/20 1040      PT SHORT TERM GOAL #1   Title Demetris will report no L LE radicular symptoms below the knee in > 1 week.    Baseline Symptoms below the knee.    Status On-going      PT SHORT TERM GOAL #2   Title pt to verbalize/ demo proper posture and lifting mechanics to prevent and reduce hip/back pain    Status On-going      PT SHORT TERM GOAL #3   Title improve Berg balance to >/= 42/56 to demo functional balance progression     Status On-going             PT Long Term Goals - 09/06/20 1051      PT LONG TERM GOAL #1   Title Princella will score 55 on the FOTO Functional Assessment at DC.    Status On-going      PT LONG TERM GOAL #2   Title Kiera will report no L LE symptoms for > 1 week.    Status On-going      PT LONG TERM GOAL #3   Title Deloria will be able to walk a mile without low back pain or radicular pain at DC.    Status On-going      PT LONG TERM GOAL #4   Title Lyanna will be independent with her long-term HEP at DC.    Status On-going                 Plan - 09/11/20 1041    Clinical Impression Statement Pt reporting  3/10 pain today and overall improvements. Pt making progress with strengthening and endurance. Still progressing with mild antalgic gait pattern with increased setp lengh and heel strike as pt was leaving clinic. Continue skilled  PT.    Personal Factors and Comorbidities Comorbidity 2    Comorbidities Neuropathy and RA    Examination-Activity Limitations Sit;Sleep;Bed Mobility;Bend;Lift    Examination-Participation Restrictions Community Activity    Stability/Clinical Decision Making Stable/Uncomplicated    Rehab Potential Good    PT Frequency 2x / week    PT Duration 4 weeks    PT Treatment/Interventions ADLs/Self Care Home Management;Cryotherapy;Electrical Stimulation;Moist Heat;Traction;Therapeutic exercise;Balance training;Therapeutic activities;Manual techniques;Patient/family education;Neuromuscular re-education;Dry needling    PT Next Visit Plan Body mechanics and postural education, leg strengthening, balance and continued postural and spine strength work (careful with knee OA)    PT Home Exercise Plan Access Code: [email or web address]    Consulted and Agree with Plan of Care Patient           Patient will benefit from skilled therapeutic intervention in order to improve the following deficits and impairments:  Decreased endurance,Decreased activity tolerance,Pain,Postural dysfunction,Improper body mechanics,Decreased strength,Decreased balance,Decreased range of motion,Difficulty walking,Impaired flexibility  Visit Diagnosis: Difficulty walking  Abnormal posture  Muscle weakness (generalized)  Radiculopathy, lumbar region     Problem List Patient Active Problem List   Diagnosis Date Noted  . Neuropathy 06/16/2020  . SIADH (syndrome of inappropriate ADH production) ([place]) 02/18/2020  . Hyponatremia 11/18/2019  . Thrombocytosis 08/01/2018  . URI (upper respiratory infection) 04/14/2017  . Primary osteoarthritis of both knees 03/06/2017  . Exertional chest pain   . Pain in the chest 03/27/2015  . Postural hypotension 05/26/2013  . OSA (obstructive sleep apnea) 03/22/2013  . Osteoarthritis 11/07/2011  . Abdominal bruit 10/16/2011  . HTN (hypertension) 08/13/2011  . Anisocoria 08/13/2011  . Cervical  radiculopathy 07/17/2011  . DDD (degenerative disc disease), cervical 07/10/2011  . Glaucoma 06/06/2011  . HIATAL HERNIA 01/30/2010  . POSTMENOPAUSAL SYNDROME 12/14/2009  . EUSTACHIAN TUBE DYSFUNCTION, RIGHT 06/28/2009  . GERD 12/07/2008  . Rheumatoid arthritis ([place]) 12/07/2008  . COLONIC POLYPS, HX OF 12/07/2008  . Hyperlipidemia LDL goal <70 10/17/2006  . HYPERCALCEMIA 10/17/2006  . Mitral valve disease 10/17/2006    Oretha Caprice, PT, MPT 09/11/2020, 10:50 AM  [facility] [address] [place], [place], [address] Phone: [phone]   Fax:  [phone]  Name: ASSUNTA PUPO MRN: [id number] Date of Birth: 1940/07/06

## 2020-09-12 ENCOUNTER — Ambulatory Visit (HOSPITAL_COMMUNITY): Payer: Medicare PPO

## 2020-09-13 ENCOUNTER — Encounter: Payer: Self-pay | Admitting: Physical Therapy

## 2020-09-13 ENCOUNTER — Other Ambulatory Visit: Payer: Self-pay

## 2020-09-13 ENCOUNTER — Ambulatory Visit: Payer: Medicare PPO | Admitting: Physical Therapy

## 2020-09-13 ENCOUNTER — Telehealth: Payer: Self-pay

## 2020-09-13 DIAGNOSIS — R293 Abnormal posture: Secondary | ICD-10-CM

## 2020-09-13 DIAGNOSIS — M5416 Radiculopathy, lumbar region: Secondary | ICD-10-CM | POA: Diagnosis not present

## 2020-09-13 DIAGNOSIS — R262 Difficulty in walking, not elsewhere classified: Secondary | ICD-10-CM | POA: Diagnosis not present

## 2020-09-13 DIAGNOSIS — M6281 Muscle weakness (generalized): Secondary | ICD-10-CM | POA: Diagnosis not present

## 2020-09-13 NOTE — Telephone Encounter (Signed)
I spoke to the pharmacy and they will fill the rest of the methotrexate prescription that was sent in on 08/16/2020 since patient only got a partial fill at the last refill. I called patient and advised her.

## 2020-09-13 NOTE — Telephone Encounter (Signed)
Patient called stating the pharmacist at [facility] told her if they receive the prescription for Methotrexate 5 vials quantity of 10 they will try to fill it today.

## 2020-09-13 NOTE — Therapy (Signed)
[facility] [address] [place], [place], [address] Phone: [phone]   Fax:  [phone]  Physical Therapy Treatment  Patient Details  Name: Felicia [name] MRN: [id number] Date of Birth: May 05, 1941 Referring Provider (PT): Magnus Sinning MD   Encounter Date: 09/13/2020   PT End of Session - 09/13/20 1026    Visit Number 8    Number of Visits 16    Date for PT Re-Evaluation 09/29/20    Authorization Type MCR: kx mod by 15th visit, progress note at 10th     Progress Note Due on Visit 10    PT Start Time 1000    PT Stop Time 1040    PT Time Calculation (min) 40 min    Activity Tolerance Patient tolerated treatment well;No increased pain    Behavior During Therapy WFL for tasks assessed/performed           Past Medical History:  Diagnosis Date  . Adenomatous colon polyp 2004   Colo in 2009 "no polyps"  . Complication of anesthesia    small airway per patient  . GERD (gastroesophageal reflux disease)   . Glaucoma   . Hiatal hernia   . Hypertension   . Neuropathy   . Rheumatoid arthritis(714.0)    [place]; Methotrexate (Dr. Patrecia Pour)  . Sleep apnea    on CPAP    Past Surgical History:  Procedure Laterality Date  . CARDIAC CATHETERIZATION N/A 03/29/2015   Procedure: Left Heart Cath and Coronary Angiography;  Surgeon: Belva Crome, MD;  Location: [facility];  Service: Cardiovascular;  Laterality: N/A;  . CATARACT EXTRACTION, BILATERAL    . COLONOSCOPY W/ POLYPECTOMY     X1; negative subsequently  . DILATION AND CURETTAGE OF UTERUS    . EYE SURGERY     due to RA  . EYE SURGERY Left 06/2017   eyelid tendon   . HYSTEROSCOPY WITH D & C  06/24/2012   Procedure: DILATATION AND CURETTAGE /HYSTEROSCOPY;  Surgeon: [name], MD;  Location: [facility];  Service: Gynecology;  Laterality: N/A;  . SHOULDER SURGERY Left    due to RA  . TRABECULECTOMY     X 2 OD; X 1 OS  . UPPER GASTROINTESTINAL ENDOSCOPY     hiatal hernia     There were no vitals filed for this visit.   Subjective Assessment - 09/13/20 1024    Subjective Pt arriving today reporting 2/10 pain in her left legs and glutes.    Pertinent History RA    Limitations Sitting    How long can you sit comfortably? < 30 minutes    How long can you walk comfortably? Has not been walking for exercise    Diagnostic tests 03/10/2018 MRI,     Patient Stated Goals Return to walking for exercise without L leg or low back pain    Currently in Pain? Yes    Pain Score 2     Pain Location --   leg and glutes   Pain Orientation Left    Pain Descriptors / Indicators Sore    Pain Type Chronic pain    Pain Onset More than a month ago                             [facility] Adult PT Treatment/Exercise - 09/13/20 0001      Exercises   Exercises Lumbar      Lumbar Exercises: Aerobic  Nustep L5 x 8 minutes      Lumbar Exercises: Machines for Strengthening   Leg Press 62# bilateral LE's 2x15      Lumbar Exercises: Standing   Other Standing Lumbar Exercises hip hiking on 4 inch step 2x10 holding 3 seconds each      Lumbar Exercises: Supine   Bridge with Ball Squeeze Limitations figure 4 single leg bridge x 10 holding 5 seconds each    Other Supine Lumbar Exercises trunk rotation x 3 to each side      Lumbar Exercises: Prone   Straight Leg Raise 10 reps;3 seconds;Limitations   2 sets   Straight Leg Raises Limitations Knees on pillow      Lumbar Exercises: Quadruped   Madcat/Old Horse Limitations 10 reps hodling 5 seconds   difficulty with trunk dissociation and pelvic tilts   Straight Leg Raise 3 seconds;5 reps    Straight Leg Raises Limitations needed contant verbal and tacile cues to keep pelvis neutral    Opposite Arm/Leg Raise Limitations attempted with CGA and cues to stabalize pelvis                    PT Short Term Goals - 09/13/20 1046      PT SHORT TERM GOAL #1   Title Felicia [name] will report no L LE radicular symptoms below  the knee in > 1 week.    Status On-going      PT SHORT TERM GOAL #2   Title pt to verbalize/ demo proper posture and lifting mechanics to prevent and reduce hip/back pain    Status On-going      PT SHORT TERM GOAL #3   Title improve Berg balance to >/= 42/56 to demo functional balance progression     Status On-going             PT Long Term Goals - 09/13/20 1047      PT LONG TERM GOAL #1   Title Felicia [name] will score 55 on the FOTO Functional Assessment at DC.    Baseline 42 at evaluation, 52 at 08/31/20    Status On-going      PT LONG TERM GOAL #2   Title Felicia [name] will report no L LE symptoms for > 1 week.    Status On-going      PT LONG TERM GOAL #3   Title Felicia [name] will be able to walk a mile without low back pain or radicular pain at DC.    Status On-going      PT LONG TERM GOAL #4   Title Felicia [name] will be independent with her long-term HEP at DC.    Status On-going                 Plan - 09/13/20 1029    Clinical Impression Statement Pt reporting 2/10 pain today in left glutes/LE. Pt compliant with her HEP and making progress toward communtiy gym. We discussed the [facility] which are located near pt's home. Pt tolerating more strengthening today with equipment used which is available in most gyms. Still progressing with heel to toe gait pattern with equalized step lengths using a heel to toe pattern. Continue skilled PT.    Personal Factors and Comorbidities Comorbidity 2    Comorbidities Neuropathy and RA    Examination-Activity Limitations Sit;Sleep;Bed Mobility;Bend;Lift    Examination-Participation Restrictions Community Activity    Stability/Clinical Decision Making Stable/Uncomplicated    Rehab Potential Good    PT Frequency 2x /  week    PT Duration 4 weeks    PT Treatment/Interventions ADLs/Self Care Home Management;Cryotherapy;Electrical Stimulation;Moist Heat;Traction;Therapeutic exercise;Balance training;Therapeutic activities;Manual techniques;Patient/family  education;Neuromuscular re-education;Dry needling    PT Next Visit Plan Body mechanics and postural education, leg strengthening, balance and continued postural and spine strength work (careful with knee OA)    PT Home Exercise Plan Access Code: [email or web address]    Consulted and Agree with Plan of Care Patient           Patient will benefit from skilled therapeutic intervention in order to improve the following deficits and impairments:  Decreased endurance,Decreased activity tolerance,Pain,Postural dysfunction,Improper body mechanics,Decreased strength,Decreased balance,Decreased range of motion,Difficulty walking,Impaired flexibility  Visit Diagnosis: Difficulty walking  Abnormal posture  Muscle weakness (generalized)  Radiculopathy, lumbar region     Problem List Patient Active Problem List   Diagnosis Date Noted  . Neuropathy 06/16/2020  . SIADH (syndrome of inappropriate ADH production) ([place]) 02/18/2020  . Hyponatremia 11/18/2019  . Thrombocytosis 08/01/2018  . URI (upper respiratory infection) 04/14/2017  . Primary osteoarthritis of both knees 03/06/2017  . Exertional chest pain   . Pain in the chest 03/27/2015  . Postural hypotension 05/26/2013  . OSA (obstructive sleep apnea) 03/22/2013  . Osteoarthritis 11/07/2011  . Abdominal bruit 10/16/2011  . HTN (hypertension) 08/13/2011  . Anisocoria 08/13/2011  . Cervical radiculopathy 07/17/2011  . DDD (degenerative disc disease), cervical 07/10/2011  . Glaucoma 06/06/2011  . HIATAL HERNIA 01/30/2010  . POSTMENOPAUSAL SYNDROME 12/14/2009  . EUSTACHIAN TUBE DYSFUNCTION, RIGHT 06/28/2009  . GERD 12/07/2008  . Rheumatoid arthritis ([place]) 12/07/2008  . COLONIC POLYPS, HX OF 12/07/2008  . Hyperlipidemia LDL goal <70 10/17/2006  . HYPERCALCEMIA 10/17/2006  . Mitral valve disease 10/17/2006    Oretha Caprice, PT, MPT 09/13/2020, 10:48 AM  [facility] [address] [place], [place],  [address] Phone: [phone]   Fax:  [phone]  Name: Felicia [name] MRN: [id number] Date of Birth: 08-19-1940

## 2020-09-14 ENCOUNTER — Ambulatory Visit (HOSPITAL_COMMUNITY)
Admission: RE | Admit: 2020-09-14 | Discharge: 2020-09-14 | Disposition: A | Discharge status: Home / Self Care | Payer: Medicare PPO | Source: Ambulatory Visit | Attending: Rheumatology | Admitting: Rheumatology

## 2020-09-14 DIAGNOSIS — M810 Age-related osteoporosis without current pathological fracture: Secondary | ICD-10-CM | POA: Diagnosis not present

## 2020-09-14 MED ORDER — ZOLEDRONIC ACID 5 MG/100ML IV SOLN
5.0000 mg | Freq: Once | INTRAVENOUS | Status: AC
  Administered 2020-09-14: 5 mg via INTRAVENOUS

## 2020-09-14 MED ORDER — ACETAMINOPHEN 325 MG PO TABS
650.0000 mg | ORAL_TABLET | Freq: Once | ORAL | Status: AC
  Administered 2020-09-14: 650 mg via ORAL

## 2020-09-14 MED ORDER — ZOLEDRONIC ACID 5 MG/100ML IV SOLN
INTRAVENOUS | Status: AC
  Filled 2020-09-14: qty 100

## 2020-09-14 MED ORDER — DIPHENHYDRAMINE HCL 25 MG PO CAPS
25.0000 mg | ORAL_CAPSULE | Freq: Once | ORAL | Status: AC
  Administered 2020-09-14: 25 mg via ORAL

## 2020-09-14 MED ORDER — DIPHENHYDRAMINE HCL 25 MG PO CAPS
ORAL_CAPSULE | ORAL | Status: AC
  Filled 2020-09-14: qty 1

## 2020-09-14 MED ORDER — ACETAMINOPHEN 325 MG PO TABS
ORAL_TABLET | ORAL | Status: AC
  Filled 2020-09-14: qty 2

## 2020-09-18 ENCOUNTER — Encounter: Payer: Self-pay | Admitting: Physical Therapy

## 2020-09-18 ENCOUNTER — Ambulatory Visit: Payer: Medicare PPO | Admitting: Physical Therapy

## 2020-09-18 ENCOUNTER — Other Ambulatory Visit: Payer: Self-pay

## 2020-09-18 DIAGNOSIS — M6281 Muscle weakness (generalized): Secondary | ICD-10-CM | POA: Diagnosis not present

## 2020-09-18 DIAGNOSIS — R293 Abnormal posture: Secondary | ICD-10-CM

## 2020-09-18 DIAGNOSIS — R262 Difficulty in walking, not elsewhere classified: Secondary | ICD-10-CM

## 2020-09-18 DIAGNOSIS — M5416 Radiculopathy, lumbar region: Secondary | ICD-10-CM

## 2020-09-18 NOTE — Patient Instructions (Signed)
Access Code: [email or web address] URL: [email or web address] Date: 09/18/2020 Prepared by: Kearney Hard  Exercises Standing Scapular Retraction - 5 x daily - 7 x weekly - 1 sets - 5 reps - 5 second hold Standing Lumbar Extension at Lucas - 5 x daily - 7 x weekly - 1 sets - 5 reps - 3 seconds hold Supine Figure 4 Piriformis Stretch - 2-3 x daily - 7 x weekly - 1 sets - 5 reps - 20 seconds hold Prone Hip Extension - 1 x daily - 7 x weekly - 2 sets - 10 reps Standing Hip Hiking - 2 x daily - 7 x weekly - 2 sets - 10 reps - 3 seconds hold Sit to Stand with Armchair - 2 x daily - 7 x weekly - 1 sets - 10 reps Quadruped Alternating Leg Extensions - 1 x daily - 7 x weekly - 2 sets - 10 reps - 3-5 seconds hold Quadruped Alternating Arm Lift - 1 x daily - 7 x weekly - 2 sets - 10 reps - 3-5 seconds hold Bird Dog - 1 x daily - 7 x weekly - 10 reps - 5 seconds hold Cat-Camel to Child's Pose - 1 x daily - 7 x weekly - 5 reps - 5 seconds hold Static Prone on Elbows - 1 x daily - 7 x weekly - 3-5 reps - 10 seconds hold

## 2020-09-18 NOTE — Therapy (Signed)
[facility] [address] [place], [place], [address] Phone: [phone]   Fax:  [phone]  Physical Therapy Treatment  Patient Details  Name: Felicia [name] MRN: [id number] Date of Birth: Oct 02, 1940 Referring Provider (PT): Magnus Sinning MD   Encounter Date: 09/18/2020   PT End of Session - 09/18/20 1023    Visit Number 9    Number of Visits 16    Date for PT Re-Evaluation 09/29/20    Authorization Type MCR: kx mod by 15th visit, progress note at 10th     Progress Note Due on Visit 10    PT Start Time 1015    PT Stop Time 1055    PT Time Calculation (min) 40 min    Activity Tolerance Patient tolerated treatment well;No increased pain    Behavior During Therapy WFL for tasks assessed/performed           Past Medical History:  Diagnosis Date  . Adenomatous colon polyp 2004   Colo in 2009 "no polyps"  . Complication of anesthesia    small airway per patient  . GERD (gastroesophageal reflux disease)   . Glaucoma   . Hiatal hernia   . Hypertension   . Neuropathy   . Rheumatoid arthritis(714.0)    [place]rencia; Methotrexate (Dr. Patrecia Pour)  . Sleep apnea    on CPAP    Past Surgical History:  Procedure Laterality Date  . CARDIAC CATHETERIZATION N/A 03/29/2015   Procedure: Left Heart Cath and Coronary Angiography;  Surgeon: Belva Crome, MD;  Location: [facility];  Service: Cardiovascular;  Laterality: N/A;  . CATARACT EXTRACTION, BILATERAL    . COLONOSCOPY W/ POLYPECTOMY     X1; negative subsequently  . DILATION AND CURETTAGE OF UTERUS    . EYE SURGERY     due to RA  . EYE SURGERY Left 06/2017   eyelid tendon   . HYSTEROSCOPY WITH D & C  06/24/2012   Procedure: DILATATION AND CURETTAGE /HYSTEROSCOPY;  Surgeon: [name], MD;  Location: [facility];  Service: Gynecology;  Laterality: N/A;  . SHOULDER SURGERY Left    due to RA  . TRABECULECTOMY     X 2 OD; X 1 OS  . UPPER GASTROINTESTINAL ENDOSCOPY     hiatal hernia     There were no vitals filed for this visit.   Subjective Assessment - 09/18/20 1020    Subjective Pt arriving today reporting 1/10 pain in her left legs and lower back    Pertinent History RA    Limitations Sitting    How long can you sit comfortably? < 30 minutes    How long can you stand comfortably? 30 min     How long can you walk comfortably? Has not been walking for exercise    Diagnostic tests 03/10/2018 MRI,     Patient Stated Goals Return to walking for exercise without L leg or low back pain    Currently in Pain? Yes    Pain Score 1     Pain Location Back    Pain Orientation Lower    Pain Type Chronic pain    Pain Onset More than a month ago    Pain Frequency Intermittent                             [facility] Adult PT Treatment/Exercise - 09/18/20 0001      Ambulation/Gait   Gait Comments amb on level  surfaces using heel to toe gait pattern with equalized step length      Exercises   Exercises Lumbar      Lumbar Exercises: Aerobic   Nustep L5 x 8 minutes      Lumbar Exercises: Machines for Strengthening   Cybex Knee Flexion 15# 2x10 controlled eccentric control    Leg Press 75# bilateral LE's 2x15      Lumbar Exercises: Standing   Other Standing Lumbar Exercises hip hiking on 4 inch step 2x10 holding 3 seconds each      Lumbar Exercises: Supine   Bridge with Ball Squeeze Limitations figure 4 single leg bridge x 10 holding 5 seconds each      Lumbar Exercises: Prone   Straight Leg Raise 10 reps;3 seconds;Limitations   2 sets   Straight Leg Raises Limitations Knees on pillow      Lumbar Exercises: Quadruped   Straight Leg Raise 3 seconds;5 reps    Straight Leg Raises Limitations needed contant verbal and tacile cues to keep pelvis neutral                  PT Education - 09/18/20 1023    Education Details updated pt's HEP    Person(s) Educated Patient    Methods Explanation;Demonstration;Handout;Tactile cues;Verbal cues     Comprehension Verbalized understanding;Returned demonstration            PT Short Term Goals - 09/18/20 1035      PT SHORT TERM GOAL #1   Title Monserratt will report no L LE radicular symptoms below the knee in > 1 week.    Status On-going      PT SHORT TERM GOAL #2   Title pt to verbalize/ demo proper posture and lifting mechanics to prevent and reduce hip/back pain    Status On-going      PT SHORT TERM GOAL #3   Title improve Berg balance to >/= 42/56 to demo functional balance progression     Status On-going             PT Long Term Goals - 09/18/20 1036      PT LONG TERM GOAL #1   Title Lilou will score 55 on the FOTO Functional Assessment at DC.    Status On-going      PT LONG TERM GOAL #2   Title Celsa will report no L LE symptoms for > 1 week.    Status On-going      PT LONG TERM GOAL #3   Title Naeemah will be able to walk a mile without low back pain or radicular pain at DC.    Status On-going      PT LONG TERM GOAL #4   Title Philana will be independent with her long-term HEP at DC.    Status On-going                 Plan - 09/18/20 1030    Clinical Impression Statement Pt reporting overall improvements in her walking and pain since beginning therpay. Pt's HEP was updated and each exercise was demonstrated. Pt states she discussed joining a gym with a friend. Pt tolerating strengthening well today.    Personal Factors and Comorbidities Comorbidity 2    Comorbidities Neuropathy and RA    Examination-Activity Limitations Sit;Sleep;Bed Mobility;Bend;Lift    Examination-Participation Restrictions Community Activity    Stability/Clinical Decision Making Stable/Uncomplicated    Rehab Potential Good    PT Frequency 2x / week    PT  Duration 4 weeks    PT Treatment/Interventions ADLs/Self Care Home Management;Cryotherapy;Electrical Stimulation;Moist Heat;Traction;Therapeutic exercise;Balance training;Therapeutic activities;Manual techniques;Patient/family  education;Neuromuscular re-education;Dry needling    PT Next Visit Plan Body mechanics and postural education, leg strengthening, balance and continued postural and spine strength work (careful with knee OA)    PT Home Exercise Plan Access Code: [email or web address]    Consulted and Agree with Plan of Care Patient           Patient will benefit from skilled therapeutic intervention in order to improve the following deficits and impairments:  Decreased endurance,Decreased activity tolerance,Pain,Postural dysfunction,Improper body mechanics,Decreased strength,Decreased balance,Decreased range of motion,Difficulty walking,Impaired flexibility  Visit Diagnosis: Difficulty walking  Abnormal posture  Muscle weakness (generalized)  Radiculopathy, lumbar region     Problem List Patient Active Problem List   Diagnosis Date Noted  . Neuropathy 06/16/2020  . SIADH (syndrome of inappropriate ADH production) ([place]) 02/18/2020  . Hyponatremia 11/18/2019  . Thrombocytosis 08/01/2018  . URI (upper respiratory infection) 04/14/2017  . Primary osteoarthritis of both knees 03/06/2017  . Exertional chest pain   . Pain in the chest 03/27/2015  . Postural hypotension 05/26/2013  . OSA (obstructive sleep apnea) 03/22/2013  . Osteoarthritis 11/07/2011  . Abdominal bruit 10/16/2011  . HTN (hypertension) 08/13/2011  . Anisocoria 08/13/2011  . Cervical radiculopathy 07/17/2011  . DDD (degenerative disc disease), cervical 07/10/2011  . Glaucoma 06/06/2011  . HIATAL HERNIA 01/30/2010  . POSTMENOPAUSAL SYNDROME 12/14/2009  . EUSTACHIAN TUBE DYSFUNCTION, RIGHT 06/28/2009  . GERD 12/07/2008  . Rheumatoid arthritis ([place]) 12/07/2008  . COLONIC POLYPS, HX OF 12/07/2008  . Hyperlipidemia LDL goal <70 10/17/2006  . HYPERCALCEMIA 10/17/2006  . Mitral valve disease 10/17/2006    Oretha Caprice, PT, MPT  09/18/2020, 10:55 AM  [facility] [address] [place], [place],  [address] Phone: [phone]   Fax:  [phone]  Name: Felicia [name] MRN: [id number] Date of Birth: 15-Oct-1940

## 2020-09-20 ENCOUNTER — Encounter: Payer: Self-pay | Admitting: Rehabilitative and Restorative Service Providers"

## 2020-09-20 ENCOUNTER — Ambulatory Visit: Payer: Medicare PPO | Admitting: Rehabilitative and Restorative Service Providers"

## 2020-09-20 ENCOUNTER — Other Ambulatory Visit: Payer: Self-pay

## 2020-09-20 DIAGNOSIS — R293 Abnormal posture: Secondary | ICD-10-CM | POA: Diagnosis not present

## 2020-09-20 DIAGNOSIS — R262 Difficulty in walking, not elsewhere classified: Secondary | ICD-10-CM

## 2020-09-20 DIAGNOSIS — M5416 Radiculopathy, lumbar region: Secondary | ICD-10-CM | POA: Diagnosis not present

## 2020-09-20 DIAGNOSIS — M6281 Muscle weakness (generalized): Secondary | ICD-10-CM

## 2020-09-20 NOTE — Therapy (Signed)
[facility] [address] [place], [place], [address] Phone: [phone]   Fax:  [phone]  Physical Therapy Treatment/Discharge  Patient Details  Name: Felicia [name] MRN: [id number] Date of Birth: Oct 20, 1940 Referring Provider (Felicia [name]): Magnus Sinning MD  PHYSICAL THERAPY DISCHARGE SUMMARY  Visits from Start of Care: 10  Current functional level related to goals / functional outcomes: See note   Remaining deficits: See note   Education / Equipment: Updated HEP Plan: Patient agrees to discharge.  Patient goals were met. Patient is being discharged due to not returning since the last visit.  ?????     Encounter Date: 09/20/2020   Felicia [name] End of Session - 09/20/20 1321    Visit Number 10    Number of Visits 16    Date for Felicia [name] Re-Evaluation 09/29/20    Authorization Type MCR: [id number] mod by 15th visit, progress note at 10th     Progress Note Due on Visit 16    Felicia [name] Start Time 1007    Felicia [name] Stop Time 1100    Felicia [name] Time Calculation (min) 53 min    Equipment Utilized During Treatment Gait belt    Activity Tolerance Patient tolerated treatment well;No increased pain    Behavior During Therapy WFL for tasks assessed/performed           Past Medical History:  Diagnosis Date  . Adenomatous colon polyp 2004   Colo in 2009 "no polyps"  . Complication of anesthesia    small airway per patient  . GERD (gastroesophageal reflux disease)   . Glaucoma   . Hiatal hernia   . Hypertension   . Neuropathy   . Rheumatoid arthritis(714.0)    Orencia; Methotrexate (Dr. Patrecia Pour)  . Sleep apnea    on CPAP    Past Surgical History:  Procedure Laterality Date  . CARDIAC CATHETERIZATION N/A 03/29/2015   Procedure: Left Heart Cath and Coronary Angiography;  Surgeon: Belva Crome, MD;  Location: [facility];  Service: Cardiovascular;  Laterality: N/A;  . CATARACT EXTRACTION, BILATERAL    . COLONOSCOPY W/ POLYPECTOMY     X1; negative subsequently  .  DILATION AND CURETTAGE OF UTERUS    . EYE SURGERY     due to RA  . EYE SURGERY Left 06/2017   eyelid tendon   . HYSTEROSCOPY WITH D & C  06/24/2012   Procedure: DILATATION AND CURETTAGE /HYSTEROSCOPY;  Surgeon: [name], MD;  Location: [facility];  Service: Gynecology;  Laterality: N/A;  . SHOULDER SURGERY Left    due to RA  . TRABECULECTOMY     X 2 OD; X 1 OS  . UPPER GASTROINTESTINAL ENDOSCOPY     hiatal hernia    There were no vitals filed for this visit.   Subjective Assessment - 09/20/20 1316    Subjective Felicia [name] reports minimal pain or functional difficulty over the past week.    Pertinent History RA    Limitations Sitting    How long can you sit comfortably? < 30 minutes    How long can you stand comfortably? 30 min     How long can you walk comfortably? Returned to 10-15 minutes    Diagnostic tests 03/10/2018 MRI,     Patient Stated Goals Return to walking for exercise without L leg or low back pain    Currently in Pain? No/denies    Pain Onset More than a month ago    Multiple Pain Sites No  [facility] Felicia [name] Assessment - 09/20/20 0001      ROM / Strength   AROM / PROM / Strength AROM;Strength      AROM   Overall AROM  Deficits    AROM Assessment Site Hip;Lumbar    Right/Left Hip Left;Right    Right Hip Flexion 110    Right Hip External Rotation  35    Right Hip Internal Rotation  18    Left Hip Flexion 110    Left Hip External Rotation  35    Left Hip Internal Rotation  15    Lumbar Extension 10      Strength   Overall Strength Deficits    Strength Assessment Site Hip;Lumbar    Right/Left Hip Left;Right      Flexibility   Soft Tissue Assessment /Muscle Length yes    Hamstrings 50 degrees/50 degrees      Berg Balance Test   Sit to Stand Able to stand without using hands and stabilize independently    Standing Unsupported Able to stand safely 2 minutes    Sitting with Back Unsupported but Feet Supported on Floor or Stool Able to sit safely  and securely 2 minutes    Stand to Sit Sits safely with minimal use of hands    Transfers Able to transfer safely, minor use of hands    Standing Unsupported with Eyes Closed Able to stand 10 seconds safely    Standing Unsupported with Feet Together Able to place feet together independently and stand 1 minute safely    From Standing, Reach Forward with Outstretched Arm Can reach forward >12 cm safely (5")    From Standing Position, Pick up Object from Floor Able to pick up shoe safely and easily    From Standing Position, Turn to Look Behind Over each Shoulder Looks behind from both sides and weight shifts well    Turn 360 Degrees Able to turn 360 degrees safely in 4 seconds or less    Standing Unsupported, Alternately Place Feet on Step/Stool Able to stand independently and safely and complete 8 steps in 20 seconds    Standing Unsupported, One Foot in Front Able to plae foot ahead of the other independently and hold 30 seconds    Standing on One Leg Able to lift leg independently and hold equal to or more than 3 seconds    Total Score 52                         [facility] Adult Felicia [name] Treatment/Exercise - 09/20/20 0001      Exercises   Exercises Lumbar      Lumbar Exercises: Stretches   Figure 4 Stretch 20 seconds;With overpressure;4 reps      Lumbar Exercises: Standing   Scapular Retraction Strengthening;Both;10 reps;Other (comment)   5 seconds shoulder blade pinches   Other Standing Lumbar Exercises Trunk extension AROM 10X 3 seconds    Other Standing Lumbar Exercises Hip hike and standing hip extension (toe in)  alternating 2 sets of 10 for 3 seconds (in doorway or at high treatment table, no lean, no slouch)                  Felicia [name] Education - 09/20/20 1317    Education Details Extensive RA, review of RA results and HEP.    Person(s) Educated Patient    Methods Explanation;Demonstration;Tactile cues;Verbal cues    Comprehension Returned demonstration;Verbal cues  required;Verbalized understanding  Felicia [name] Short Term Goals - 09/20/20 1318      Felicia [name] SHORT TERM GOAL #1   Title Felicia [name] will report no L LE radicular symptoms below the knee in > 1 week.    Status Achieved      Felicia [name] SHORT TERM GOAL #2   Title Felicia [name] to verbalize/ demo proper posture and lifting mechanics to prevent and reduce hip/back pain    Status Achieved      Felicia [name] SHORT TERM GOAL #3   Title improve Berg balance to >/= 42/56 to demo functional balance progression     Status Achieved             Felicia [name] Long Term Goals - 09/20/20 1318      Felicia [name] LONG TERM GOAL #1   Title Felicia [name] will score 55 on the FOTO Functional Assessment at DC.    Baseline 60, 42 at evaluation    Status Achieved      Felicia [name] LONG TERM GOAL #2   Title Felicia [name] will report no L LE symptoms for > 1 week.    Status Achieved      Felicia [name] LONG TERM GOAL #3   Title Felicia [name] will be able to walk a mile without low back pain or radicular pain at DC.    Baseline Just started    Status On-going      Felicia [name] LONG TERM GOAL #4   Title Felicia [name] will be independent with her long-term HEP at DC.    Status Achieved                 Plan - 09/20/20 1321    Clinical Impression Statement Felicia [name] has made excellent progress with her supervised Felicia [name].  She is now a low falls risk with improved hip AROM, strength and self-reported function.  She is independent with her HEP and appears ready for independent rehabilitation.  Felicia [name] is welcome to return to supervised Felicia [name] if progress with independent rehabilitation is not as anticipated.    Personal Factors and Comorbidities Comorbidity 2    Comorbidities Neuropathy and RA    Examination-Activity Limitations Sit;Sleep;Bed Mobility;Bend;Lift    Examination-Participation Restrictions Community Activity    Stability/Clinical Decision Making Stable/Uncomplicated    Rehab Potential Good    Felicia [name] Frequency 2x / week    Felicia [name] Duration 4 weeks    Felicia [name] Treatment/Interventions ADLs/Self Care Home Management;Cryotherapy;Electrical  Stimulation;Moist Heat;Traction;Therapeutic exercise;Balance training;Therapeutic activities;Manual techniques;Patient/family education;Neuromuscular re-education;Dry needling    Felicia [name] Next Visit Plan DC    Felicia [name] Home Exercise Plan Access Code: [email or web address]    Consulted and Agree with Plan of Care Patient           Patient will benefit from skilled therapeutic intervention in order to improve the following deficits and impairments:  Decreased endurance,Decreased activity tolerance,Pain,Postural dysfunction,Improper body mechanics,Decreased strength,Decreased balance,Decreased range of motion,Difficulty walking,Impaired flexibility  Visit Diagnosis: Difficulty walking  Abnormal posture  Muscle weakness (generalized)  Radiculopathy, lumbar region     Problem List Patient Active Problem List   Diagnosis Date Noted  . Neuropathy 06/16/2020  . SIADH (syndrome of inappropriate ADH production) ([place]) 02/18/2020  . Hyponatremia 11/18/2019  . Thrombocytosis 08/01/2018  . URI (upper respiratory infection) 04/14/2017  . Primary osteoarthritis of both knees 03/06/2017  . Exertional chest pain   . Pain in the chest 03/27/2015  . Postural hypotension 05/26/2013  . OSA (obstructive sleep apnea) 03/22/2013  . Osteoarthritis 11/07/2011  . Abdominal bruit 10/16/2011  . HTN (hypertension) 08/13/2011  . Anisocoria 08/13/2011  . Cervical  radiculopathy 07/17/2011  . DDD (degenerative disc disease), cervical 07/10/2011  . Glaucoma 06/06/2011  . HIATAL HERNIA 01/30/2010  . POSTMENOPAUSAL SYNDROME 12/14/2009  . EUSTACHIAN TUBE DYSFUNCTION, RIGHT 06/28/2009  . GERD 12/07/2008  . Rheumatoid arthritis ([place]) 12/07/2008  . COLONIC POLYPS, HX OF 12/07/2008  . Hyperlipidemia LDL goal <70 10/17/2006  . HYPERCALCEMIA 10/17/2006  . Mitral valve disease 10/17/2006    Farley Ly Felicia [name], MPT 09/20/2020, 1:24 PM  [facility] [address] [place], [place], [address] Phone:  [phone]   Fax:  [phone]  Name: Felicia [name] MRN: [id number] Date of Birth: 1941-02-06

## 2020-09-25 ENCOUNTER — Other Ambulatory Visit: Payer: Self-pay | Admitting: *Deleted

## 2020-09-25 ENCOUNTER — Encounter: Payer: Medicare PPO | Admitting: Physical Therapy

## 2020-09-25 MED ORDER — PRAVASTATIN SODIUM 20 MG PO TABS: 20.0000 mg | ORAL_TABLET | Freq: Every evening | ORAL | 0 refills | Status: DC

## 2020-09-27 ENCOUNTER — Encounter: Payer: Medicare PPO | Admitting: Physical Therapy

## 2020-10-02 ENCOUNTER — Other Ambulatory Visit: Payer: Self-pay

## 2020-10-02 DIAGNOSIS — Z79899 Other long term (current) drug therapy: Secondary | ICD-10-CM | POA: Diagnosis not present

## 2020-10-03 LAB — COMPLETE METABOLIC PANEL WITH GFR
AG Ratio: 1.9 (calc) (ref 1.0–2.5)
ALT: 17 U/L (ref 6–29)
AST: 20 U/L (ref 10–35)
Albumin: 4.1 g/dL (ref 3.6–5.1)
Alkaline phosphatase (APISO): 52 U/L (ref 37–153)
BUN: 20 mg/dL (ref 7–25)
CO2: 25 mmol/L (ref 20–32)
Calcium: 9.7 mg/dL (ref 8.6–10.4)
Chloride: 100 mmol/L (ref 98–110)
Creat: 0.85 mg/dL (ref 0.60–0.93)
GFR, Est African American: 76 mL/min/{1.73_m2} (ref >=60)
GFR, Est Non African American: 66 mL/min/{1.73_m2} (ref >=60)
Globulin: 2.2 g/dL (calc) (ref 1.9–3.7)
Glucose, Bld: 92 mg/dL (ref 65–99)
Potassium: 5.1 mmol/L (ref 3.5–5.3)
Sodium: 133 mmol/L — ABNORMAL LOW (ref 135–146)
Total Bilirubin: 0.5 mg/dL (ref 0.2–1.2)
Total Protein: 6.3 g/dL (ref 6.1–8.1)

## 2020-10-03 LAB — CBC WITH DIFFERENTIAL/PLATELET
Absolute Monocytes: 546 cells/uL (ref 200–950)
Basophils Absolute: 50 cells/uL (ref 0–200)
Basophils Relative: 0.6 %
Eosinophils Absolute: 67 cells/uL (ref 15–500)
Eosinophils Relative: 0.8 %
HCT: 34.6 % — ABNORMAL LOW (ref 35.0–45.0)
Hemoglobin: 11.4 g/dL — ABNORMAL LOW (ref 11.7–15.5)
Lymphs Abs: 2352 cells/uL (ref 850–3900)
MCH: 31.4 pg (ref 27.0–33.0)
MCHC: 32.9 g/dL (ref 32.0–36.0)
MCV: 95.3 fL (ref 80.0–100.0)
MPV: 10 fL (ref 7.5–12.5)
Monocytes Relative: 6.5 %
Neutro Abs: 5384 cells/uL (ref 1500–7800)
Neutrophils Relative %: 64.1 %
Platelets: 328 10*3/uL (ref 140–400)
RBC: 3.63 10*6/uL — ABNORMAL LOW (ref 3.80–5.10)
RDW: 13.2 % (ref 11.0–15.0)
Total Lymphocyte: 28 %
WBC: 8.4 10*3/uL (ref 3.8–10.8)

## 2020-10-03 NOTE — Progress Notes (Signed)
Office Visit Note  Patient: Felicia [name]             Date of Birth: Aug 31, 1940           MRN: [id number]             PCP: Kathyrn Lass, MD Referring: Kathyrn Lass, MD Visit Date: 10/17/2020 Occupation: @GUAROCC @  Subjective:  Medication monitoring   History of Present Illness: Felicia [name] is a 80 y.o. female with a history of seropositive rheumatoid arthritis, osteoarthritis, degenerative disc disease and osteoporosis.  She was switched from [place] to Rinvoq and is started Rinvoq on September 06, 2020.  She has been also taking subcutaneous methotrexate.  She has noted gradual improvement in all of her joint symptoms and swelling.  She was also seen by Dr. Ernestina Patches for lower back pain and had injections.  She had physical therapy after that which was very helpful.  Left trochanteric bursitis has resolved.  She denies any joint pain or joint swelling today.  She would like to switch from subcutaneous methotrexate to oral methotrexate.  Activities of Daily Living:  Patient reports morning stiffness for 10-15 minutes.   Patient Reports nocturnal pain.  Difficulty dressing/grooming: Denies Difficulty climbing stairs: Denies Difficulty getting out of chair: Denies Difficulty using hands for taps, buttons, cutlery, and/or writing: Reports  Review of Systems  Constitutional: Positive for fatigue.  HENT: Positive for mouth dryness. Negative for mouth sores and nose dryness.   Eyes: Positive for dryness. Negative for pain and itching.  Respiratory: Negative for shortness of breath and difficulty breathing.   Cardiovascular: Negative for chest pain and palpitations.  Gastrointestinal: Negative for blood in stool, constipation and diarrhea.  Endocrine: Negative for increased urination.  Genitourinary: Negative for difficulty urinating.  Musculoskeletal: Positive for myalgias, morning stiffness, muscle tenderness and myalgias. Negative for arthralgias, joint pain and joint swelling.  Skin:  Negative for color change, rash and redness.  Allergic/Immunologic: Positive for susceptible to infections.  Neurological: Positive for numbness. Negative for dizziness, headaches, memory loss and weakness.  Hematological: Positive for bruising/bleeding tendency.  Psychiatric/Behavioral: Negative for confusion.    PMFS History:  Patient Active Problem List   Diagnosis Date Noted  . Neuropathy 06/16/2020  . SIADH (syndrome of inappropriate ADH production) ([place]) 02/18/2020  . Hyponatremia 11/18/2019  . Thrombocytosis 08/01/2018  . URI (upper respiratory infection) 04/14/2017  . Primary osteoarthritis of both knees 03/06/2017  . Exertional chest pain   . Pain in the chest 03/27/2015  . Postural hypotension 05/26/2013  . OSA (obstructive sleep apnea) 03/22/2013  . Osteoarthritis 11/07/2011  . Abdominal bruit 10/16/2011  . HTN (hypertension) 08/13/2011  . Anisocoria 08/13/2011  . Cervical radiculopathy 07/17/2011  . DDD (degenerative disc disease), cervical 07/10/2011  . Glaucoma 06/06/2011  . HIATAL HERNIA 01/30/2010  . POSTMENOPAUSAL SYNDROME 12/14/2009  . EUSTACHIAN TUBE DYSFUNCTION, RIGHT 06/28/2009  . GERD 12/07/2008  . Rheumatoid arthritis ([place]) 12/07/2008  . COLONIC POLYPS, HX OF 12/07/2008  . Hyperlipidemia LDL goal <70 10/17/2006  . HYPERCALCEMIA 10/17/2006  . Mitral valve disease 10/17/2006    Past Medical History:  Diagnosis Date  . Adenomatous colon polyp 2004   Colo in 2009 "no polyps"  . Complication of anesthesia    small airway per patient  . GERD (gastroesophageal reflux disease)   . Glaucoma   . Hiatal hernia   . Hypertension   . Neuropathy   . Rheumatoid arthritis(714.0)    Orencia; Methotrexate (Dr. Patrecia Pour)  . Sleep apnea  on CPAP    Family History  Problem Relation Age of Onset  . Dementia Mother   . Lung disease Mother        bronchiectasis  . Heart disease Mother        Aortic Stenosis  . Osteoporosis Mother   . Heart attack Father  90       S/P CBAG  . Stroke Maternal Grandmother 83  . Ulcerative colitis Brother   . Diabetes Neg Hx   . Cancer Neg Hx   . Colon cancer Neg Hx   . Esophageal cancer Neg Hx   . Stomach cancer Neg Hx   . Pancreatic cancer Neg Hx   . Liver disease Neg Hx    Past Surgical History:  Procedure Laterality Date  . CARDIAC CATHETERIZATION N/A 03/29/2015   Procedure: Left Heart Cath and Coronary Angiography;  Surgeon: Belva Crome, MD;  Location: [facility];  Service: Cardiovascular;  Laterality: N/A;  . CATARACT EXTRACTION, BILATERAL    . COLONOSCOPY W/ POLYPECTOMY     X1; negative subsequently  . DILATION AND CURETTAGE OF UTERUS    . EYE SURGERY     due to RA  . EYE SURGERY Left 06/2017   eyelid tendon   . HYSTEROSCOPY WITH D & C  06/24/2012   Procedure: DILATATION AND CURETTAGE /HYSTEROSCOPY;  Surgeon: [name], MD;  Location: [facility];  Service: Gynecology;  Laterality: N/A;  . SHOULDER SURGERY Left    due to RA  . TRABECULECTOMY     X 2 OD; X 1 OS  . 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 Influenza Split 03/08/2014  . Influenza Whole 04/07/2007, 02/24/2008, 03/10/2009, 03/03/2012  . Influenza, High Dose Seasonal PF 01/15/2016, 03/07/2017  . Influenza,inj,Quad PF,6+ Mos 03/15/2013, 02/21/2015, 03/17/2018  . PFIZER(Purple Top)SARS-COV-2 Vaccination 06/24/2019, 07/13/2019, 01/15/2020, 06/26/2020  . PPD Test 03/10/2012  . Pneumococcal Conjugate-13 09/12/2015  . Pneumococcal Polysaccharide-23 03/25/2003, 04/12/2008  . Td 12/14/2009  . Zoster Recombinat (Shingrix) 03/23/2018     Objective: Vital Signs: BP 114/69 (BP Location: Left Arm, Patient Position: Sitting, Cuff Size: Normal)   Pulse 69   Ht 5\' 5"  (1.651 m)   Wt 138 lb (62.6 kg)   BMI 22.96 kg/m    Physical  Exam Vitals and nursing note reviewed.  Constitutional:      Appearance: She is well-developed.  HENT:     Head: Normocephalic and atraumatic.  Eyes:     Conjunctiva/sclera: Conjunctivae normal.  Cardiovascular:     Rate and Rhythm: Normal rate and regular rhythm.     Heart sounds: Normal heart sounds.  Pulmonary:     Effort: Pulmonary effort is normal.     Breath sounds: Normal breath sounds.  Abdominal:     General: Bowel sounds are normal.     Palpations: Abdomen is soft.  Musculoskeletal:     Cervical back: Normal range of motion.  Lymphadenopathy:     Cervical: No cervical adenopathy.  Skin:    General: Skin is warm and dry.     Capillary Refill: Capillary refill takes less than 2 seconds.  Neurological:     Mental Status: She is alert and oriented to person, place, and time.  Psychiatric:  Behavior: Behavior normal.      Musculoskeletal Exam: She has some limitation of range of motion of her cervical spine without any discomfort.  She had no point tenderness over thoracic lumbar spine.  Shoulder joints, elbow joints, wrist joints with good range of motion.  She no synovitis over MCPs or PIPs.  MCP thickening was noted.  Hip joints and knee joints with good range of motion.  She has overcrowding of the toes due to subluxation of the first MTPs bilaterally.  No synovitis was noted.  CDAI Exam: CDAI Score: 0.2  Patient Global: 1 mm; Provider Global: 1 mm Swollen: 0 ; Tender: 0  Joint Exam 10/17/2020   No joint exam has been documented for this visit   There is currently no information documented on the homunculus. Go to the Rheumatology activity and complete the homunculus joint exam.  Investigation: No additional findings.  Imaging: No results found.  Recent Labs: Lab Results  Component Value Date   WBC 8.4 10/02/2020   HGB 11.4 (L) 10/02/2020   PLT 328 10/02/2020   NA 133 (L) 10/02/2020   K 5.1 10/02/2020   CL 100 10/02/2020   CO2 25 10/02/2020    GLUCOSE 92 10/02/2020   BUN 20 10/02/2020   CREATININE 0.85 10/02/2020   BILITOT 0.5 10/02/2020   ALKPHOS 74 11/18/2019   AST 20 10/02/2020   ALT 17 10/02/2020   PROT 6.3 10/02/2020   ALBUMIN 4.1 11/18/2019   CALCIUM 9.7 10/02/2020   GFRAA 76 10/02/2020   QFTBGOLDPLUS NEGATIVE 12/03/2019    Speciality Comments: TB gold neg 11/07/17 Reclast first infusion was given August 10, 2019, September 18, 2020 Rinvoq started September 06, 2020  Procedures:  No procedures performed Allergies: Sulfonamide derivatives, Amlodipine, and Norvasc [amlodipine besylate]   Assessment / Plan:     Visit Diagnoses: Rheumatoid arthritis involving multiple sites with positive rheumatoid factor ([place]) - +RF, +ANA: Patient is clinically doing much better on Rinvoq.  She started Rinvoq on September 06, 2020.  Labs are stable.  We will continue to monitor labs.  She wants to switch from subcutaneous methotrexate to p.o. methotrexate as she does not like taking injections.  I was in agreement.  As she is clinically doing well we will give her methotrexate 6 tablets p.o. weekly along with folic acid 1 mg p.o. daily.  If she does well over the next 2 weeks she will reduce the dose of methotrexate to 4 tablets p.o. weekly.  We may be able to taper off methotrexate completely.  High risk medication use - rinvoq 15 mg 1 tablet by mouth daily, methotrexate 0.8 mL every 7 days and folic acid 1 mg 2 tablets daily. (previously on orencia).  Her labs from Oct 02, 2020 were normal except for mild anemia.  We will check labs again in 3 months and then every 3 months to monitor for drug toxicity.  Blackbox warning on Rinvoq was reviewed.  She has been advised to discontinue medication in case she develops an infection.  She will resume medication once infection resolves.  She is fully vaccinated against COVID-19.  Patient states she has had pneumococcal and Shingrix vaccines.  Primary osteoarthritis of both hands-joint protection was  discussed.  Primary osteoarthritis of both knees-she is currently not having any discomfort.  DDD (degenerative disc disease), cervical-she has limited range of motion without discomfort.  DDD (degenerative disc disease), lumbar - She has severe scoliosis multilevel spondylosis and L4-L5 spondylolisthesis was noted.She had an MRI of  the lumbar spine on 07/18/2020 which showed L4-5 left paracentral extrusion with superior migration and impingement of the L4 and L5 nerve root.  She had injections by Dr. Ernestina Patches and had physical therapy which helped her symptoms remarkably.  Age-related osteoporosis without current pathological fracture - DEXA 07/26/2019 right 1/3 distal radius BMD 0.54 with T score -2.5.Reclast 08/2020.  Calcium rich diet and vitamin D supplement was discussed.  History of vitamin D deficiency  History of hyperlipidemia-increased risk of heart disease with rheumatoid arthritis was discussed.  Need for regular exercise and dietary modifications were discussed.  Handout was placed in the AVS.   Other medical problems listed as follows:  Tremor  Neuropathy - she is followed by Dr. Leta Baptist.  SIADH (syndrome of inappropriate ADH production) ([place]) - followed by endocrinology.  History of colonic polyps  Low sodium levels  History of sleep apnea  History of glaucoma  History of gastroesophageal reflux (GERD)  Orders: No orders of the defined types were placed in this encounter.  Meds ordered this encounter  Medications  . methotrexate (RHEUMATREX) 2.5 MG tablet    Sig: Take 6 tablets by mouth once weekly. Caution:Chemotherapy. Protect from light.    Dispense:  72 tablet    Refill:  0     Follow-Up Instructions: Return in about 3 months (around 01/17/2021) for Rheumatoid arthritis, Osteoarthritis.   Bo Merino, MD  Note - This record has been created using Editor, commissioning.  Chart creation errors have been sought, but may not always  have been located.  Such creation errors do not reflect on  the standard of medical care.

## 2020-10-03 NOTE — Progress Notes (Signed)
Sodium is low and stable.  Anemia noted.  Please forward labs to her PCP.

## 2020-10-14 ENCOUNTER — Other Ambulatory Visit: Payer: Self-pay | Admitting: Rheumatology

## 2020-10-17 ENCOUNTER — Encounter: Payer: Self-pay | Admitting: Rheumatology

## 2020-10-17 ENCOUNTER — Other Ambulatory Visit: Payer: Self-pay

## 2020-10-17 ENCOUNTER — Ambulatory Visit: Payer: Medicare PPO | Admitting: Rheumatology

## 2020-10-17 ENCOUNTER — Telehealth: Payer: Self-pay | Admitting: Interventional Cardiology

## 2020-10-17 VITALS — BP 114/69 | HR 69 | Ht 65.0 in | Wt 138.0 lb

## 2020-10-17 DIAGNOSIS — R251 Tremor, unspecified: Secondary | ICD-10-CM | POA: Diagnosis not present

## 2020-10-17 DIAGNOSIS — M810 Age-related osteoporosis without current pathological fracture: Secondary | ICD-10-CM | POA: Diagnosis not present

## 2020-10-17 DIAGNOSIS — G629 Polyneuropathy, unspecified: Secondary | ICD-10-CM

## 2020-10-17 DIAGNOSIS — M170 Bilateral primary osteoarthritis of knee: Secondary | ICD-10-CM

## 2020-10-17 DIAGNOSIS — M0579 Rheumatoid arthritis with rheumatoid factor of multiple sites without organ or systems involvement: Secondary | ICD-10-CM | POA: Diagnosis not present

## 2020-10-17 DIAGNOSIS — Z8639 Personal history of other endocrine, nutritional and metabolic disease: Secondary | ICD-10-CM | POA: Diagnosis not present

## 2020-10-17 DIAGNOSIS — M25552 Pain in left hip: Secondary | ICD-10-CM

## 2020-10-17 DIAGNOSIS — M5136 Other intervertebral disc degeneration, lumbar region: Secondary | ICD-10-CM | POA: Diagnosis not present

## 2020-10-17 DIAGNOSIS — M7062 Trochanteric bursitis, left hip: Secondary | ICD-10-CM

## 2020-10-17 DIAGNOSIS — Z8719 Personal history of other diseases of the digestive system: Secondary | ICD-10-CM

## 2020-10-17 DIAGNOSIS — M19042 Primary osteoarthritis, left hand: Secondary | ICD-10-CM

## 2020-10-17 DIAGNOSIS — M19041 Primary osteoarthritis, right hand: Secondary | ICD-10-CM | POA: Diagnosis not present

## 2020-10-17 DIAGNOSIS — Z86010 Personal history of colonic polyps: Secondary | ICD-10-CM

## 2020-10-17 DIAGNOSIS — E222 Syndrome of inappropriate secretion of antidiuretic hormone: Secondary | ICD-10-CM

## 2020-10-17 DIAGNOSIS — M5030 Other cervical disc degeneration, unspecified cervical region: Secondary | ICD-10-CM

## 2020-10-17 DIAGNOSIS — Z79899 Other long term (current) drug therapy: Secondary | ICD-10-CM

## 2020-10-17 DIAGNOSIS — Z8669 Personal history of other diseases of the nervous system and sense organs: Secondary | ICD-10-CM

## 2020-10-17 DIAGNOSIS — E871 Hypo-osmolality and hyponatremia: Secondary | ICD-10-CM

## 2020-10-17 MED ORDER — PRAVASTATIN SODIUM 20 MG PO TABS: 20.0000 mg | ORAL_TABLET | Freq: Every evening | ORAL | 0 refills | Status: DC

## 2020-10-17 MED ORDER — METHOTREXATE 2.5 MG PO TABS: ORAL_TABLET | ORAL | 0 refills | Status: DC

## 2020-10-17 NOTE — Telephone Encounter (Signed)
Spoke with pt and made her aware that I have not received any labs from Rheumatology.  Advised the last ones I see in the computer are from 2021.  Pt going to see their office today and will have labs sent.  Pt appreciative for call.

## 2020-10-17 NOTE — Patient Instructions (Addendum)
Standing Labs We placed an order today for your standing lab work.   Please have your standing labs drawn in August and every 3 months  If possible, please have your labs drawn 2 weeks prior to your appointment so that the provider can discuss your results at your appointment.  We have open lab daily Monday through Thursday from 1:30-4:30 PM and Friday from 1:30-4:00 PM at the office of Dr. Lamisha Roussell, [facility].   Please be advised, all patients with office appointments requiring lab work will take precedents over walk-in lab work.  If possible, please come for your lab work on Monday and Friday afternoons, as you may experience shorter wait times. The office is located at [address], [address], [place], [place] [address] No appointment is necessary.   Labs are drawn by [facility]. Please bring your co-pay at the time of your lab draw.  You may receive a bill from [facility] for your lab work.  If you wish to have your labs drawn at another location, please call the office 24 hours in advance to send orders.  If you have any questions regarding directions or hours of operation,  please call [phone].   As a reminder, please drink plenty of water prior to coming for your lab work. Thanks!   Heart Disease Prevention   Your inflammatory disease increases your risk of heart disease which includes heart attack, stroke, atrial fibrillation (irregular heartbeats), high blood pressure, heart failure and atherosclerosis (plaque in the arteries).  It is important to reduce your risk by:   . Keep blood pressure, cholesterol, and blood sugar at healthy levels   . Smoking Cessation   . Maintain a healthy weight  o BMI 20-25   . Eat a healthy diet  o Plenty of fresh fruit, vegetables, and whole grains  o Limit saturated fats, foods high in sodium, and added sugars  o DASH and Mediterranean diet   . Increase physical activity  o Recommend moderate physically activity for  150 minutes per week/ 30 minutes a day for five days a week These can be broken up into three separate ten-minute sessions during the day.   . Reduce Stress  . Meditation, slow breathing exercises, yoga, coloring books  . Dental visits twice a year   

## 2020-10-17 NOTE — Progress Notes (Signed)
Medication Samples have been provided to the patient.  Drug name: Rinvoq      Strength: 15mg         Qty: 1  LOT: [id number]  Exp.Date: 03/03/2022  Dosing instructions: Take 1 tablet by mouth daily.

## 2020-10-17 NOTE — Telephone Encounter (Signed)
Patient called to make sure that Dr. Tamala Julian is receiving her labs work from Dr. Estanislado Pandy she stated they should be coming every 3 months. Please advise

## 2020-10-26 DIAGNOSIS — H401233 Low-tension glaucoma, bilateral, severe stage: Secondary | ICD-10-CM | POA: Diagnosis not present

## 2020-10-26 DIAGNOSIS — H409 Unspecified glaucoma: Secondary | ICD-10-CM | POA: Diagnosis not present

## 2020-11-02 DIAGNOSIS — D044 Carcinoma in situ of skin of scalp and neck: Secondary | ICD-10-CM | POA: Diagnosis not present

## 2020-11-02 DIAGNOSIS — Z86007 Personal history of in-situ neoplasm of skin: Secondary | ICD-10-CM | POA: Diagnosis not present

## 2020-11-02 DIAGNOSIS — D485 Neoplasm of uncertain behavior of skin: Secondary | ICD-10-CM | POA: Diagnosis not present

## 2020-11-02 DIAGNOSIS — L821 Other seborrheic keratosis: Secondary | ICD-10-CM | POA: Diagnosis not present

## 2020-11-02 DIAGNOSIS — C44719 Basal cell carcinoma of skin of left lower limb, including hip: Secondary | ICD-10-CM | POA: Diagnosis not present

## 2020-11-02 DIAGNOSIS — L578 Other skin changes due to chronic exposure to nonionizing radiation: Secondary | ICD-10-CM | POA: Diagnosis not present

## 2020-11-02 DIAGNOSIS — L570 Actinic keratosis: Secondary | ICD-10-CM | POA: Diagnosis not present

## 2020-11-07 DIAGNOSIS — I25119 Atherosclerotic heart disease of native coronary artery with unspecified angina pectoris: Secondary | ICD-10-CM | POA: Diagnosis not present

## 2020-11-07 DIAGNOSIS — D649 Anemia, unspecified: Secondary | ICD-10-CM | POA: Diagnosis not present

## 2020-11-07 DIAGNOSIS — R7303 Prediabetes: Secondary | ICD-10-CM | POA: Diagnosis not present

## 2020-11-13 DIAGNOSIS — U071 COVID-19: Secondary | ICD-10-CM | POA: Diagnosis not present

## 2020-11-16 ENCOUNTER — Telehealth: Payer: Self-pay

## 2020-11-16 ENCOUNTER — Other Ambulatory Visit: Payer: Self-pay | Admitting: Physician Assistant

## 2020-11-16 DIAGNOSIS — M0579 Rheumatoid arthritis with rheumatoid factor of multiple sites without organ or systems involvement: Secondary | ICD-10-CM

## 2020-11-16 NOTE — Telephone Encounter (Signed)
Patient advised we have sent her prescription to the pharmacy. Patient advised since she has Covid she will need to hold her Rinvoq and her MTX for at least one week after symptoms resolve. Patient expressed understanding.   If you test POSITIVE for COVID19 and have MILD to MODERATE symptoms: First, call your PCP if you would like to receive COVID19 treatment AND Hold your medications during the infection and for at least 1 week after your symptoms have resolved: Injectable medication (Benlysta, Cimzia, Cosentyx, Enbrel, Humira, Orencia, Remicade, Simponi, Stelara, Taltz, Tremfya) Methotrexate Leflunomide (Arava) Mycophenolate (Cellcept) Morrie Sheldon, Olumiant, or Rinvoq If you take Actemra or Kevzara, you DO NOT need to hold these for COVID19 infection.

## 2020-11-16 NOTE — Telephone Encounter (Signed)
Patient left a voicemail calling to explain why [facility] might be asking for renewal of Rinvoq.  It is a little early but they are going to set up a time for the Rinvoq to arrive before July 4.  I take my last pill on 7/4 and they don't ship on Tuesday.  I am trying to arrange it to arrive a couple of days early in order not to run out during the holiday.    Also I have Covid and this is my 5th day.  I talked to the PCP and they prescribed medication which has helped.

## 2020-11-16 NOTE — Telephone Encounter (Signed)
Next Visit: 01/22/2021  Last Visit: 10/17/2020  Last Fill: 09/01/2020  DX: Rheumatoid arthritis involving multiple sites with positive rheumatoid factor  Current Dose per office note 10/17/2020: rinvoq 15 mg 1 tablet by mouth daily  Labs: 10/02/2020 Sodium is low and stable.    TB Gold: 12/03/2019 Neg   Okay to refill Rinvoq?

## 2020-11-21 DIAGNOSIS — Z20822 Contact with and (suspected) exposure to covid-19: Secondary | ICD-10-CM | POA: Diagnosis not present

## 2020-12-08 DIAGNOSIS — C44719 Basal cell carcinoma of skin of left lower limb, including hip: Secondary | ICD-10-CM | POA: Diagnosis not present

## 2020-12-08 DIAGNOSIS — D044 Carcinoma in situ of skin of scalp and neck: Secondary | ICD-10-CM | POA: Diagnosis not present

## 2020-12-14 DIAGNOSIS — H04123 Dry eye syndrome of bilateral lacrimal glands: Secondary | ICD-10-CM | POA: Diagnosis not present

## 2020-12-14 DIAGNOSIS — H409 Unspecified glaucoma: Secondary | ICD-10-CM | POA: Diagnosis not present

## 2020-12-14 DIAGNOSIS — H401233 Low-tension glaucoma, bilateral, severe stage: Secondary | ICD-10-CM | POA: Diagnosis not present

## 2020-12-22 DIAGNOSIS — C44719 Basal cell carcinoma of skin of left lower limb, including hip: Secondary | ICD-10-CM | POA: Diagnosis not present

## 2021-01-02 NOTE — Progress Notes (Addendum)
Cardiology Office Note:    Date:  01/03/2021   ID:  Felicia [name], DOB [birth year], MRN [id number]  PCP:  Kathyrn Lass, MD   [facility] Providers Cardiologist:  Sinclair Grooms, MD      Referring MD: Kathyrn Lass, MD   Chief Complaint:  Follow-up (CAD)    Patient Profile:    Felicia [name] is a 80 y.o. female with:  Coronary artery disease  Non-obstructive disease by cath in 10/16 Hypertension  GERD OSA Rheumatoid arthritis   Prior CV studies:  LEFT HEART CATH AND CORONARY ANGIOGRAPHY 03/29/2015 Narrative 1. Prox LAD to Mid LAD lesion, 45% stenosed.  Widely patent coronary arteries with proximal to mid eccentric 40-45% LAD narrowing.  Normal left ventricular size and function.  False positive electrocardiographic response to exercise, likely blood pressure related.  Chest discomfort does not appear to be related to myocardial ischemia.  Exercise Tolerance Test 03/27/2015 Narrative  Blood pressure demonstrated a hypertensive response to exercise.  Upsloping ST segment depression ST segment depression was noted during stress in the II, V4, V5 and V6 leads.  Mr Chest Wo Contrast   03/10/2015   IMPRESSION: 1. Severe asymmetric left-sided [place] joint arthropathy. This could be asymmetric OA. An erosive arthropathy such as rheumatoid arthritis and psoriatic arthritis are possibilities also. 2. No surrounding inflammation/myositis or cellulitis to suggest septic arthritis. Electronically Signed   By: Marijo Sanes M.D.   On: 03/10/2015 08:34   Echo 4/15 Vigorous LVF, EF 65-70%, normal wall motion, normal diastolic function, trivial AI, mild TR   Renal Art [place] 5/13 IMPRESSION:  No Doppler evidence of hemodynamically significant renal artery stenosis.   History of Present Illness: Felicia [name] was last seen in 1/21 by Dr. Tamala Julian.  She returns for f/u.  She is here alone.  She has noted some issues with fatigue over the last several months.  She did have COVID a few weeks ago.   She has felt more fatigued since then.  She does note some chest heaviness.  This seems to occur with activities.  She has some associated shortness of breath with this.  She also notes symptoms when she is sitting still.  She sleeps on an incline secondary to acid reflux.  She has not had syncope.  She has had mild insignificant ankle edema in the past.        Past Medical History:  Diagnosis Date   Adenomatous colon polyp 2004   [place] in 2009 "no polyps"   Complication of anesthesia    small airway per patient   GERD (gastroesophageal reflux disease)    Glaucoma    Hiatal hernia    Hypertension    Neuropathy    Rheumatoid arthritis(714.0)    Orencia; Methotrexate (Dr. Patrecia Pour)   Sleep apnea    on CPAP    Current Medications: Current Meds  Medication Sig   Apoaequorin (PREVAGEN PO) Take 1 tablet by mouth daily.   Calcium Carbonate-Vitamin D 600-400 MG-UNIT tablet Take 2 tablets by mouth daily.    Carboxymethylcellulose Sodium (REFRESH OP) Apply 1 drop to eye daily as needed (FOR DRY EYES).   Cholecalciferol (VITAMIN D3) 1000 UNITS CAPS Take 1 capsule by mouth every other day.   Cyanocobalamin (VITAMIN B-12) 2500 MCG SUBL Place 1 each under the tongue daily.   diclofenac Sodium (VOLTAREN) 1 % GEL Apply 2 g topically 3 (three) times daily.   esomeprazole (NEXIUM) 40 MG capsule TAKE 1 CAPSULE BY MOUTH TWICE DAILY BEFORE  MEALS   estradiol (ESTRACE) 1 MG tablet Take 1 mg by mouth daily.   folic acid (FOLVITE) 1 MG tablet TAKE 2 TABLETS(2 MG) BY MOUTH DAILY   gabapentin (NEURONTIN) 100 MG capsule Take 100 mg by mouth 2 (two) times daily.   Krill Oil Omega-3 300 MG CAPS Take 1 capsule by mouth daily.   latanoprost (XALATAN) 0.005 % ophthalmic solution Place 1 drop into the right eye at bedtime.   latanoprost (XALATAN) 0.005 % ophthalmic solution Apply to eye.   losartan (COZAAR) 100 MG tablet Take 100 mg by mouth daily.   methotrexate (RHEUMATREX) 2.5 MG tablet Take 6 tablets by mouth  once weekly. Caution:Chemotherapy. Protect from light.   metoprolol (LOPRESSOR) 50 MG tablet Take 50 mg by mouth daily.    metroNIDAZOLE (METROGEL) 0.75 % gel Apply 1 application topically 2 (two) times daily.    MULTIPLE VITAMIN PO Take 1 tablet by mouth daily.   Polyethylene Glycol 3350 (MIRALAX PO) Take 1 Dose by mouth as needed.   pravastatin (PRAVACHOL) 20 MG tablet Take 1 tablet (20 mg total) by mouth every evening.   progesterone (PROMETRIUM) 200 MG capsule Take 200 mg by mouth daily.   RINVOQ 15 MG TB24 TAKE 1 TABLET BY MOUTH EVERY DAY   TUBERCULIN SYR 1CC/27GX1/2" (B-D TB SYRINGE 1CC/27GX1/2") 27G X 1/2" 1 ML MISC USE WITH METHOTREXATE   valACYclovir (VALTREX) 1000 MG tablet Take 1,000 mg by mouth as needed (fever blister). Reported on 06/14/2015   Wheat Dextrin (BENEFIBER DRINK MIX PO) Take 1 Scoop by mouth at bedtime.     Allergies:   Sulfonamide derivatives, Amlodipine, and Norvasc [amlodipine besylate]   Social History   Tobacco Use   Smoking status: Former    Packs/day: 0.50    Years: 5.00    Pack years: 2.50    Types: Cigarettes    Quit date: 06/04/1971    Years since quitting: 49.6   Smokeless tobacco: Never   Tobacco comments:    smoked about 3-5 years , up to 1/2 ppd  Vaping Use   Vaping Use: Never used  Substance Use Topics   Alcohol use: No   Drug use: No     Family Hx: The patient's family history includes Dementia in her mother; Heart attack (age of onset: 18) in her father; Heart disease in her mother; Lung disease in her mother; Osteoporosis in her mother; Stroke (age of onset: 96) in her maternal grandmother; Ulcerative colitis in her brother. There is no history of Diabetes, Cancer, Colon cancer, Esophageal cancer, Stomach cancer, Pancreatic cancer, or Liver disease.  Review of Systems  Constitutional: Negative for fever.  Respiratory:  Negative for cough.   Gastrointestinal:  Negative for constipation, diarrhea and hematochezia.  Genitourinary:   Negative for hematuria.  All other systems reviewed and are negative.   EKGs/Labs/Other Test Reviewed:    EKG:  EKG is ordered today.  The ekg ordered today demonstrates normal sinus rhythm, heart rate 63, normal axis, no ST-T wave changes, QTC 411, similar to prior tracing  Recent Labs: 10/02/2020: ALT 17; BUN 20; Creat 0.85; Hemoglobin 11.4; Platelets 328; Potassium 5.1; Sodium 133   Recent Lipid Panel Lab Results  Component Value Date/Time   CHOL 146 10/20/2019 08:41 AM   TRIG 78 10/20/2019 08:41 AM   HDL 59 10/20/2019 08:41 AM   LDLCALC 72 10/20/2019 08:41 AM   LDLDIRECT 160.4 12/14/2009 12:06 PM      Risk Assessment/Calculations:      Physical Exam:  VS:  BP (!) 124/50   Ht '5\' 5"'$  (1.651 m)   Wt 134 lb (60.8 kg)   SpO2 96%   BMI 22.30 kg/m     Wt Readings from Last 3 Encounters:  01/03/21 134 lb (60.8 kg)  10/17/20 138 lb (62.6 kg)  09/14/20 132 lb (59.9 kg)     Constitutional:      Appearance: Healthy appearance. Not in distress.  Neck:     Vascular: JVD normal.  Pulmonary:     Effort: Pulmonary effort is normal.     Breath sounds: No wheezing. No rales.  Cardiovascular:     Normal rate. Regular rhythm. Normal S1. Normal S2.      Murmurs: There is a grade 1/6 early systolic murmur at the URSB.  Edema:    Peripheral edema absent.  Abdominal:     Palpations: Abdomen is soft.  Skin:    General: Skin is warm and dry.  Neurological:     Mental Status: Alert and oriented to person, place and time.     Cranial Nerves: Cranial nerves are intact.       ASSESSMENT & PLAN:    1. Coronary artery disease involving native coronary artery of native heart without angina pectoris 2. Precordial pain 3. Shortness of breath 4. Fatigue Cardiac catheterization in 2016 demonstrated mild nonobstructive CAD.  Over the past several months, she has noted exertional chest discomfort as well as shortness of breath.  She has typical and atypical features.  Her  electrocardiogram does not demonstrate any ischemic changes.  I have recommended proceeding with stress testing to further evaluate.  I will also recommend an echocardiogram as she does have a soft systolic murmur on exam.  This is likely aortic sclerosis.  Some of her fatigue may be related to recent COVID-19 illness.  I have asked her to follow-up with primary care.  Her hemoglobin has been mildly depressed in the past.  I will obtain a CBC as well as a BMET and TSH today.  Follow-up in 3 months.  -Lexiscan Myoview  -Echocardiogram  -BMET, CBC, TSH  4. Hyperlipidemia LDL goal <70 Recent LDL above goal at 96 (11/07/2020).  We discussed increasing the dose of her pravastatin.  She questions whether or not her fatigue may be related to statin therapy.  She had difficulty tolerating rosuvastatin in the past.  I have asked her to hold her pravastatin for 3 to 4 weeks.  If she has no improvement in her symptoms, she should resume pravastatin and increase the dose to 40 mg daily.  We will need to obtain follow-up lipids and LFTs 3 months afterward.  If she does have improved symptoms off of statin therapy, consider ezetimibe versus referral to lipid clinic for consideration of PCSK9 inhibitor therapy.  5. Essential hypertension The patient's blood pressure is controlled on her current regimen.  Continue current therapy.     Shared Decision Making/Informed Consent The risks [chest pain, shortness of breath, cardiac arrhythmias, dizziness, blood pressure fluctuations, myocardial infarction, stroke/transient ischemic attack, nausea, vomiting, allergic reaction, radiation exposure, metallic taste sensation and life-threatening complications (estimated to be 1 in 10,000)], benefits (risk stratification, diagnosing coronary artery disease, treatment guidance) and alternatives of a nuclear stress test were discussed in detail with Felicia [name] and she agrees to proceed.    Dispo:  Return in about 3 months (around  04/05/2021) for Routine Follow Up w/ Dr. Tamala Julian.   Medication Adjustments/Labs and Tests Ordered: Current medicines are reviewed at length  with the patient today.  Concerns regarding medicines are outlined above.  Tests Ordered: Orders Placed This Encounter  Procedures   Basic Metabolic Panel (BMET)   CBC   TSH   Cardiac Stress Test: Informed Consent Details: Physician/Practitioner Attestation; Transcribe to consent form and obtain patient signature   Myocardial Perfusion Imaging   EKG 12-Lead   ECHOCARDIOGRAM COMPLETE    Medication Changes: No orders of the defined types were placed in this encounter.   Signed, Richardson Dopp, PA-C  01/03/2021 1:03 PM    [facility] [address], [place], [place]  [address] Phone: [phone]; Fax: [phone]

## 2021-01-03 ENCOUNTER — Other Ambulatory Visit: Payer: Self-pay

## 2021-01-03 ENCOUNTER — Encounter: Payer: Self-pay | Admitting: Physician Assistant

## 2021-01-03 ENCOUNTER — Ambulatory Visit: Payer: Medicare PPO | Admitting: Physician Assistant

## 2021-01-03 VITALS — BP 124/50 | Ht 65.0 in | Wt 134.0 lb

## 2021-01-03 DIAGNOSIS — E785 Hyperlipidemia, unspecified: Secondary | ICD-10-CM | POA: Diagnosis not present

## 2021-01-03 DIAGNOSIS — R0602 Shortness of breath: Secondary | ICD-10-CM | POA: Diagnosis not present

## 2021-01-03 DIAGNOSIS — R072 Precordial pain: Secondary | ICD-10-CM

## 2021-01-03 DIAGNOSIS — I2510 Atherosclerotic heart disease of native coronary artery without angina pectoris: Secondary | ICD-10-CM | POA: Diagnosis not present

## 2021-01-03 DIAGNOSIS — R5383 Other fatigue: Secondary | ICD-10-CM

## 2021-01-03 DIAGNOSIS — I10 Essential (primary) hypertension: Secondary | ICD-10-CM

## 2021-01-03 LAB — CBC
Hematocrit: 34.4 % (ref 34.0–46.6)
Hemoglobin: 11.6 g/dL (ref 11.1–15.9)
MCH: 32.1 pg (ref 26.6–33.0)
MCHC: 33.7 g/dL (ref 31.5–35.7)
MCV: 95 fL (ref 79–97)
Platelets: 451 10*3/uL — ABNORMAL HIGH (ref 150–450)
RBC: 3.61 x10E6/uL — ABNORMAL LOW (ref 3.77–5.28)
RDW: 14 % (ref 11.7–15.4)
WBC: 9.8 10*3/uL (ref 3.4–10.8)

## 2021-01-03 LAB — BASIC METABOLIC PANEL
BUN/Creatinine Ratio: 22 (ref 12–28)
BUN: 19 mg/dL (ref 8–27)
CO2: 19 mmol/L — ABNORMAL LOW (ref 20–29)
Calcium: 10 mg/dL (ref 8.7–10.3)
Chloride: 99 mmol/L (ref 96–106)
Creatinine, Ser: 0.87 mg/dL (ref 0.57–1.00)
Glucose: 100 mg/dL — ABNORMAL HIGH (ref 65–99)
Potassium: 4.7 mmol/L (ref 3.5–5.2)
Sodium: 132 mmol/L — ABNORMAL LOW (ref 134–144)
eGFR: 68 mL/min/{1.73_m2} (ref >59)

## 2021-01-03 LAB — TSH: TSH: 2.06 u[IU]/mL (ref 0.450–4.500)

## 2021-01-03 NOTE — Patient Instructions (Signed)
Medication Instructions:   It is ok to hold pravastatin X 3-4 weeks.  If your weakness is better please call office at [phone] or send mychart message.  If weakness not any better resume pravastatin at an increased dose. (40 mg) daily. You will need fasting labs in 3 months. You can do this at your ov with Dr. Tamala Julian.   *If you need a refill on your cardiac medications before your next appointment, please call your pharmacy*   Lab [facility]!!!!!!  BMET/CBC/TSH.    If you have labs (blood work) drawn today and your tests are completely normal, you will receive your results only by: [place] (if you have MyChart) OR A paper copy in the mail If you have any lab test that is abnormal or we need to change your treatment, we will call you to review the results.   Testing/Procedures:DX: CP You are scheduled for a Myocardial Perfusion Imaging Study on              at            .   Please arrive 15 minutes prior to your appointment time for registration and insurance purposes.   The test will take approximately 3 to 4 hours to complete; you may bring reading material. If someone comes with you to your appointment, they will need to remain in the main lobby due to limited space in the testing area.   How to prepare for your Myocardial Perfusion test:   Do not eat or drink 3 hours prior to your test, except you may have water.    Do not consume products containing caffeine (regular or decaffeinated) 12 hours prior to your test (ex: coffee, chocolate, soda, tea)   Do bring a list of your current medications with you. If not listed below, you may take your medications as normal.   Bring any held medication to your appointment, as you may be required to take it once the test is complete.   Do wear comfortable clothes (no dresses or overalls) and walking shoes. Tennis shoes are preferred. No heels or open toed shoes.  Do not wear perfume, or lotions (deodorant is allowed).   If  these instructions are not followed, you test will have to be rescheduled.   Please report to [address] for your test. If you have questions or concerns about your appointment, please call the Nuclear Lab at [phone].  If you cannot keep your appointment, please provide 24 hour notification to the Nuclear lab to avoid a possible $50 charge to your account.    Your physician has requested that you have an echocardiogram. DX: MURMUR. Echocardiography is a painless test that uses sound waves to create images of your heart. It provides your doctor with information about the size and shape of your heart and how well your heart's chambers and valves are working. This procedure takes approximately one hour. There are no restrictions for this procedure.    Follow-Up: At [facility], you and your health needs are our priority.  As part of our continuing mission to provide you with exceptional heart care, we have created designated Provider Care Teams.  These Care Teams include your primary Cardiologist (physician) and Advanced Practice Providers (APPs -  Physician Assistants and Nurse Practitioners) who all work together to provide you with the care you need, when you need it.  We recommend signing up for the patient portal called "MyChart".  Sign up information  is provided on this After Visit Summary.  MyChart is used to connect with patients for Virtual Visits (Telemedicine).  Patients are able to view lab/test results, encounter notes, upcoming appointments, etc.  Non-urgent messages can be sent to your provider as well.   To learn more about what you can do with MyChart, go to [email or web address].    Your next appointment:   3 month(s)  The format for your next appointment:   In Person  Provider:   Daneen Schick, MD   Other Instructions

## 2021-01-04 ENCOUNTER — Other Ambulatory Visit: Payer: Self-pay | Admitting: *Deleted

## 2021-01-04 DIAGNOSIS — Z79899 Other long term (current) drug therapy: Secondary | ICD-10-CM

## 2021-01-04 DIAGNOSIS — Z111 Encounter for screening for respiratory tuberculosis: Secondary | ICD-10-CM

## 2021-01-04 DIAGNOSIS — Z9225 Personal history of immunosupression therapy: Secondary | ICD-10-CM

## 2021-01-05 ENCOUNTER — Other Ambulatory Visit: Payer: Self-pay | Admitting: *Deleted

## 2021-01-05 DIAGNOSIS — Z79899 Other long term (current) drug therapy: Secondary | ICD-10-CM | POA: Diagnosis not present

## 2021-01-05 DIAGNOSIS — Z9225 Personal history of immunosupression therapy: Secondary | ICD-10-CM | POA: Diagnosis not present

## 2021-01-05 DIAGNOSIS — Z111 Encounter for screening for respiratory tuberculosis: Secondary | ICD-10-CM

## 2021-01-08 ENCOUNTER — Telehealth: Payer: Self-pay | Admitting: *Deleted

## 2021-01-08 DIAGNOSIS — Z111 Encounter for screening for respiratory tuberculosis: Secondary | ICD-10-CM

## 2021-01-08 DIAGNOSIS — Z9225 Personal history of immunosupression therapy: Secondary | ICD-10-CM

## 2021-01-08 LAB — HEPATIC FUNCTION PANEL
AG Ratio: 1.9 (calc) (ref 1.0–2.5)
ALT: 13 U/L (ref 6–29)
AST: 20 U/L (ref 10–35)
Albumin: 4 g/dL (ref 3.6–5.1)
Alkaline phosphatase (APISO): 38 U/L (ref 37–153)
Bilirubin, Direct: 0.1 mg/dL (ref 0.0–0.2)
Globulin: 2.1 g/dL (calc) (ref 1.9–3.7)
Indirect Bilirubin: 0.4 mg/dL (calc) (ref 0.2–1.2)
Total Bilirubin: 0.5 mg/dL (ref 0.2–1.2)
Total Protein: 6.1 g/dL (ref 6.1–8.1)

## 2021-01-08 LAB — QUANTIFERON-TB GOLD PLUS
Mitogen-NIL: 0.47 IU/mL
NIL: 0.01 IU/mL
QuantiFERON-TB Gold Plus: UNDETERMINED — AB
TB1-NIL: 0.01 IU/mL
TB2-NIL: 0.01 IU/mL
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
## 2021-01-08 NOTE — Telephone Encounter (Signed)
-----   Message from Bo Merino, MD sent at 01/08/2021  8:02 AM EDT ----- CMP is normal.  TB gold is indeterminate.  Please have patient repeat TB Gold in 2 weeks.

## 2021-01-08 NOTE — Progress Notes (Signed)
CMP is normal.  TB gold is indeterminate.  Please have patient repeat TB Gold in 2 weeks.

## 2021-01-16 ENCOUNTER — Encounter: Payer: Self-pay | Admitting: Rheumatology

## 2021-01-16 ENCOUNTER — Other Ambulatory Visit: Payer: Self-pay | Admitting: Rheumatology

## 2021-01-16 NOTE — Telephone Encounter (Signed)
Next Visit: 01/22/2021  Last Visit: 5/17/202  Last Fill: 10/17/2020  DX: Rheumatoid arthritis involving multiple sites with positive rheumatoid factor   Current Dose per office note 10/17/2020: She wants to switch from subcutaneous methotrexate to p.o. methotrexate as she does not like taking injections.  I was in agreement.  As she is clinically doing well we will give her methotrexate 6 tablets p.o. weekly  Labs: 01/03/2021 RBC 3.61, Platelets 451, Glucose 100, Sodium 132, CO2 19  Okay to refill MTX?

## 2021-01-16 NOTE — Telephone Encounter (Signed)
She may restart Rinvoq after the course of antibiotics after she gets the clearance from the doctor.

## 2021-01-17 ENCOUNTER — Emergency Department (HOSPITAL_BASED_OUTPATIENT_CLINIC_OR_DEPARTMENT_OTHER)
Admission: EM | Admit: 2021-01-17 | Discharge: 2021-01-17 | Disposition: A | Discharge status: Home / Self Care | Payer: Medicare PPO | Attending: Emergency Medicine | Admitting: Emergency Medicine

## 2021-01-17 ENCOUNTER — Other Ambulatory Visit: Payer: Self-pay

## 2021-01-17 ENCOUNTER — Emergency Department (HOSPITAL_BASED_OUTPATIENT_CLINIC_OR_DEPARTMENT_OTHER): Payer: Medicare PPO

## 2021-01-17 ENCOUNTER — Emergency Department (HOSPITAL_BASED_OUTPATIENT_CLINIC_OR_DEPARTMENT_OTHER): Payer: Medicare PPO | Admitting: Radiology

## 2021-01-17 ENCOUNTER — Encounter (HOSPITAL_BASED_OUTPATIENT_CLINIC_OR_DEPARTMENT_OTHER): Payer: Self-pay | Admitting: Obstetrics and Gynecology

## 2021-01-17 DIAGNOSIS — N134 Hydroureter: Secondary | ICD-10-CM | POA: Diagnosis not present

## 2021-01-17 DIAGNOSIS — M79605 Pain in left leg: Secondary | ICD-10-CM | POA: Diagnosis not present

## 2021-01-17 DIAGNOSIS — Z041 Encounter for examination and observation following transport accident: Secondary | ICD-10-CM | POA: Diagnosis not present

## 2021-01-17 DIAGNOSIS — Z87891 Personal history of nicotine dependence: Secondary | ICD-10-CM | POA: Insufficient documentation

## 2021-01-17 DIAGNOSIS — R519 Headache, unspecified: Secondary | ICD-10-CM | POA: Diagnosis not present

## 2021-01-17 DIAGNOSIS — M5031 Other cervical disc degeneration,  high cervical region: Secondary | ICD-10-CM | POA: Diagnosis not present

## 2021-01-17 DIAGNOSIS — R0789 Other chest pain: Secondary | ICD-10-CM | POA: Diagnosis not present

## 2021-01-17 DIAGNOSIS — Z79899 Other long term (current) drug therapy: Secondary | ICD-10-CM | POA: Diagnosis not present

## 2021-01-17 DIAGNOSIS — S3991XA Unspecified injury of abdomen, initial encounter: Secondary | ICD-10-CM

## 2021-01-17 DIAGNOSIS — I10 Essential (primary) hypertension: Secondary | ICD-10-CM | POA: Diagnosis not present

## 2021-01-17 DIAGNOSIS — Y92410 Unspecified street and highway as the place of occurrence of the external cause: Secondary | ICD-10-CM | POA: Diagnosis not present

## 2021-01-17 LAB — CBC
HCT: 33 % — ABNORMAL LOW (ref 36.0–46.0)
Hemoglobin: 11.3 g/dL — ABNORMAL LOW (ref 12.0–15.0)
MCH: 32.5 pg (ref 26.0–34.0)
MCHC: 34.2 g/dL (ref 30.0–36.0)
MCV: 94.8 fL (ref 80.0–100.0)
Platelets: 369 10*3/uL (ref 150–400)
RBC: 3.48 MIL/uL — ABNORMAL LOW (ref 3.87–5.11)
RDW: 13.9 % (ref 11.5–15.5)
WBC: 10.6 10*3/uL — ABNORMAL HIGH (ref 4.0–10.5)
nRBC: 0 % (ref 0.0–0.2)

## 2021-01-17 LAB — COMPREHENSIVE METABOLIC PANEL
ALT: 17 U/L (ref 0–44)
AST: 22 U/L (ref 15–41)
Albumin: 4.1 g/dL (ref 3.5–5.0)
Alkaline Phosphatase: 44 U/L (ref 38–126)
Anion gap: 10 (ref 5–15)
BUN: 15 mg/dL (ref 8–23)
CO2: 22 mmol/L (ref 22–32)
Calcium: 9.9 mg/dL (ref 8.9–10.3)
Chloride: 97 mmol/L — ABNORMAL LOW (ref 98–111)
Creatinine, Ser: 0.82 mg/dL (ref 0.44–1.00)
GFR, Estimated: >60 mL/min (ref >60)
Glucose, Bld: 95 mg/dL (ref 70–99)
Potassium: 4.2 mmol/L (ref 3.5–5.1)
Sodium: 129 mmol/L — ABNORMAL LOW (ref 135–145)
Total Bilirubin: 0.7 mg/dL (ref 0.3–1.2)
Total Protein: 6.4 g/dL — ABNORMAL LOW (ref 6.5–8.1)

## 2021-01-17 MED ORDER — IOHEXOL 350 MG/ML SOLN
75.0000 mL | Freq: Once | INTRAVENOUS | Status: AC | PRN
  Administered 2021-01-17: 75 mL via INTRAVENOUS

## 2021-01-17 NOTE — ED Notes (Signed)
Pt request that Ultrasound, pelvis, be done another day. ED MD aware of pt request and will speak with client

## 2021-01-17 NOTE — Discharge Instructions (Addendum)
You have been seen and discharged from the emergency department.  Your CAT scan imaging showed no traumatic injury.  It did have an incidental finding of a right lower quadrant/adnexal cyst/mass.  It is recommended by radiology that you have an ultrasound for further evaluation.  You may return to ultrasound at this facility between the hours of 8 AM and 4 PM to have this completed.  Results will be reviewed by your primary doctor.  Follow-up with your primary provider for reevaluation and further care. Take home medications as prescribed. If you have any worsening symptoms or further concerns for your health please return to an emergency department for further evaluation.
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
## 2021-01-18 ENCOUNTER — Telehealth: Payer: Self-pay

## 2021-01-18 NOTE — Telephone Encounter (Signed)
Spoke with the patient, detailed instructions given. She stated that she would be here for her test. Asked to call back with any questions. S.Felicia [name] EMTP 

## 2021-01-19 ENCOUNTER — Other Ambulatory Visit: Payer: Self-pay

## 2021-01-19 ENCOUNTER — Ambulatory Visit (HOSPITAL_BASED_OUTPATIENT_CLINIC_OR_DEPARTMENT_OTHER)
Admission: RE | Admit: 2021-01-19 | Discharge: 2021-01-19 | Disposition: A | Discharge status: Home / Self Care | Payer: Medicare PPO | Source: Ambulatory Visit | Attending: Emergency Medicine | Admitting: Emergency Medicine

## 2021-01-19 DIAGNOSIS — D251 Intramural leiomyoma of uterus: Secondary | ICD-10-CM | POA: Insufficient documentation

## 2021-01-19 NOTE — ED Provider Notes (Signed)
Pt had f/u pelvic u/s showing right adnexal mass and uterine mass.  Recommend MRI pelvis.  I discussed with pt who will follow up with her ob/gyn, Dr. Philis Pique to arrange this.   Malvin Johns, MD 01/19/21 [phone]

## 2021-01-22 ENCOUNTER — Ambulatory Visit: Payer: Medicare PPO | Admitting: Physician Assistant

## 2021-01-22 ENCOUNTER — Other Ambulatory Visit: Payer: Self-pay

## 2021-01-22 ENCOUNTER — Encounter: Payer: Self-pay | Admitting: Physician Assistant

## 2021-01-22 VITALS — BP 170/73 | HR 63 | Ht 65.0 in | Wt 135.6 lb

## 2021-01-22 DIAGNOSIS — I2510 Atherosclerotic heart disease of native coronary artery without angina pectoris: Secondary | ICD-10-CM

## 2021-01-22 DIAGNOSIS — Z8639 Personal history of other endocrine, nutritional and metabolic disease: Secondary | ICD-10-CM | POA: Diagnosis not present

## 2021-01-22 DIAGNOSIS — Z8589 Personal history of malignant neoplasm of other organs and systems: Secondary | ICD-10-CM

## 2021-01-22 DIAGNOSIS — E871 Hypo-osmolality and hyponatremia: Secondary | ICD-10-CM

## 2021-01-22 DIAGNOSIS — Z79899 Other long term (current) drug therapy: Secondary | ICD-10-CM | POA: Diagnosis not present

## 2021-01-22 DIAGNOSIS — Z111 Encounter for screening for respiratory tuberculosis: Secondary | ICD-10-CM | POA: Diagnosis not present

## 2021-01-22 DIAGNOSIS — E222 Syndrome of inappropriate secretion of antidiuretic hormone: Secondary | ICD-10-CM

## 2021-01-22 DIAGNOSIS — M5136 Other intervertebral disc degeneration, lumbar region: Secondary | ICD-10-CM

## 2021-01-22 DIAGNOSIS — M19041 Primary osteoarthritis, right hand: Secondary | ICD-10-CM

## 2021-01-22 DIAGNOSIS — M5030 Other cervical disc degeneration, unspecified cervical region: Secondary | ICD-10-CM

## 2021-01-22 DIAGNOSIS — M170 Bilateral primary osteoarthritis of knee: Secondary | ICD-10-CM

## 2021-01-22 DIAGNOSIS — G629 Polyneuropathy, unspecified: Secondary | ICD-10-CM

## 2021-01-22 DIAGNOSIS — M0579 Rheumatoid arthritis with rheumatoid factor of multiple sites without organ or systems involvement: Secondary | ICD-10-CM

## 2021-01-22 DIAGNOSIS — I10 Essential (primary) hypertension: Secondary | ICD-10-CM

## 2021-01-22 DIAGNOSIS — R1900 Intra-abdominal and pelvic swelling, mass and lump, unspecified site: Secondary | ICD-10-CM

## 2021-01-22 DIAGNOSIS — M810 Age-related osteoporosis without current pathological fracture: Secondary | ICD-10-CM

## 2021-01-22 DIAGNOSIS — R251 Tremor, unspecified: Secondary | ICD-10-CM

## 2021-01-22 DIAGNOSIS — M19042 Primary osteoarthritis, left hand: Secondary | ICD-10-CM

## 2021-01-22 DIAGNOSIS — Z8669 Personal history of other diseases of the nervous system and sense organs: Secondary | ICD-10-CM

## 2021-01-22 DIAGNOSIS — Z86010 Personal history of colonic polyps: Secondary | ICD-10-CM

## 2021-01-22 DIAGNOSIS — Z8719 Personal history of other diseases of the digestive system: Secondary | ICD-10-CM

## 2021-01-23 ENCOUNTER — Ambulatory Visit (HOSPITAL_COMMUNITY): Payer: Medicare PPO | Attending: Cardiovascular Disease

## 2021-01-23 ENCOUNTER — Encounter: Payer: Self-pay | Admitting: Physician Assistant

## 2021-01-23 ENCOUNTER — Encounter (HOSPITAL_COMMUNITY): Payer: Self-pay | Admitting: Radiology

## 2021-01-23 ENCOUNTER — Ambulatory Visit (HOSPITAL_BASED_OUTPATIENT_CLINIC_OR_DEPARTMENT_OTHER): Payer: Medicare PPO

## 2021-01-23 DIAGNOSIS — E785 Hyperlipidemia, unspecified: Secondary | ICD-10-CM

## 2021-01-23 DIAGNOSIS — R0602 Shortness of breath: Secondary | ICD-10-CM

## 2021-01-23 DIAGNOSIS — R072 Precordial pain: Secondary | ICD-10-CM | POA: Diagnosis not present

## 2021-01-23 DIAGNOSIS — I10 Essential (primary) hypertension: Secondary | ICD-10-CM | POA: Diagnosis not present

## 2021-01-23 DIAGNOSIS — I2510 Atherosclerotic heart disease of native coronary artery without angina pectoris: Secondary | ICD-10-CM

## 2021-01-23 LAB — ECHOCARDIOGRAM COMPLETE
Area-P 1/2: 4.36 cm2
Height: 65 in
P 1/2 time: 396 msec
S' Lateral: 2.7 cm
Weight: 2160 oz

## 2021-01-23 MED ORDER — TECHNETIUM TC 99M TETROFOSMIN IV KIT
10.8000 | PACK | Freq: Once | INTRAVENOUS | Status: AC | PRN
  Administered 2021-01-23: 10.8 via INTRAVENOUS
  Filled 2021-01-23: qty 11

## 2021-01-24 ENCOUNTER — Other Ambulatory Visit: Payer: Self-pay

## 2021-01-24 ENCOUNTER — Encounter: Payer: Self-pay | Admitting: Physician Assistant

## 2021-01-24 ENCOUNTER — Ambulatory Visit (HOSPITAL_COMMUNITY): Payer: Medicare PPO | Attending: Cardiology

## 2021-01-24 DIAGNOSIS — R072 Precordial pain: Secondary | ICD-10-CM | POA: Diagnosis not present

## 2021-01-24 LAB — MYOCARDIAL PERFUSION IMAGING
Base ST Depression (mm): 0 mm
LV dias vol: 58 mL (ref 46–106)
LV sys vol: 21 mL
MPHR: 141 {beats}/min
Nuc Stress EF: 65 %
Rest Nuclear Isotope Dose: 10.8 mCi
SDS: 2
SRS: 1
SSS: 3
ST Depression (mm): 0 mm
Stress Nuclear Isotope Dose: 30.8 mCi
TID: 0.99

## 2021-01-24 MED ORDER — REGADENOSON 0.4 MG/5ML IV SOLN
0.4000 mg | Freq: Once | INTRAVENOUS | Status: AC
  Administered 2021-01-24: 0.4 mg via INTRAVENOUS

## 2021-01-24 MED ORDER — TECHNETIUM TC 99M TETROFOSMIN IV KIT
30.8000 | PACK | Freq: Once | INTRAVENOUS | Status: AC | PRN
  Administered 2021-01-24: 30.8 via INTRAVENOUS
  Filled 2021-01-24: qty 31

## 2021-01-25 ENCOUNTER — Other Ambulatory Visit: Payer: Self-pay | Admitting: Obstetrics and Gynecology

## 2021-01-25 DIAGNOSIS — M25519 Pain in unspecified shoulder: Secondary | ICD-10-CM | POA: Diagnosis not present

## 2021-01-25 DIAGNOSIS — S20219D Contusion of unspecified front wall of thorax, subsequent encounter: Secondary | ICD-10-CM | POA: Diagnosis not present

## 2021-01-25 DIAGNOSIS — R1900 Intra-abdominal and pelvic swelling, mass and lump, unspecified site: Secondary | ICD-10-CM

## 2021-01-26 LAB — QUANTIFERON-TB GOLD PLUS
Mitogen-NIL: 0.22 IU/mL
NIL: 0.05 IU/mL
QuantiFERON-TB Gold Plus: UNDETERMINED — AB
TB1-NIL: <0 IU/mL
TB2-NIL: 0 IU/mL

## 2021-01-26 NOTE — Progress Notes (Signed)
Repeat TB Gold is indeterminate as well.  Patient will need PPD placement by her PCP.

## 2021-01-29 ENCOUNTER — Other Ambulatory Visit: Payer: Self-pay | Admitting: Interventional Cardiology

## 2021-01-30 ENCOUNTER — Telehealth: Payer: Self-pay | Admitting: Physician Assistant

## 2021-01-30 ENCOUNTER — Other Ambulatory Visit: Payer: Self-pay | Admitting: *Deleted

## 2021-01-30 DIAGNOSIS — E785 Hyperlipidemia, unspecified: Secondary | ICD-10-CM

## 2021-01-30 DIAGNOSIS — Z111 Encounter for screening for respiratory tuberculosis: Secondary | ICD-10-CM | POA: Diagnosis not present

## 2021-01-30 MED ORDER — EZETIMIBE 10 MG PO TABS: 10.0000 mg | ORAL_TABLET | Freq: Every day | ORAL | 3 refills | Status: DC

## 2021-01-30 NOTE — Telephone Encounter (Signed)
Addressed in lab note.

## 2021-01-30 NOTE — Telephone Encounter (Signed)
Patient was returning call for results 

## 2021-02-02 ENCOUNTER — Other Ambulatory Visit: Payer: Self-pay | Admitting: *Deleted

## 2021-02-02 DIAGNOSIS — G4730 Sleep apnea, unspecified: Secondary | ICD-10-CM

## 2021-02-02 DIAGNOSIS — I272 Pulmonary hypertension, unspecified: Secondary | ICD-10-CM

## 2021-02-06 ENCOUNTER — Telehealth: Payer: Self-pay | Admitting: *Deleted

## 2021-02-06 NOTE — Telephone Encounter (Signed)
Pt came in today with her friend Felicia [name] to get the pt set up with [facility]. Pt couldn't get app for sleep study on her phone while in the office and will set up once they get home and she has access to her passwords for her phone. I did go over the tutorial on my phone with the pt and her friend Felicia [name]. Both gave verbal understanding on how to use [facility]. I assured them that there is an 800 # on the bottom of the paperwork I gave them today if they have any problems at all. Assured pt once I have authorization from the insurance company I will call and give her the PIN# [phone]. Pt is aware until then; be sure to not open the box. Pt aware if insurance denies study pt will need to bring back sleep study unopened, if opened will be charged $100. The pt is agreeable to plan of care. I will send a message to our Sleep Pool to see if PA is needed.   Patient Name: Felicia [name]        DOB: 12/15/2040      Height: '5\' 1"'$     Weight: 134  Office Name: [place]         Referring Provider: Chelsea [name]   Today's Date: 02/06/21  Date:   STOP BANG RISK ASSESSMENT S (snore) Have you been told that you snore?     YES   T (tired) Are you often tired, fatigued, or sleepy during the day?   YES  O (obstruction) Do you stop breathing, choke, or gasp during sleep? NO   P (pressure) Do you have or are you being treated for high blood pressure? YES   B (BMI) Is your body index greater than 35 kg/m? NO   A (age) Are you 42 years old or older? YES   N (neck) Do you have a neck circumference greater than 16 inches?   NO   G (gender) Are you a female? NO   TOTAL STOP/BANG "YES" ANSWERS 4                                                                       For Office Use Only              Procedure Order Form    YES to 3+ Stop Bang questions OR two clinical symptoms - patient qualifies for WatchPAT (CPT 95800)             Clinical Notes: Will consult Sleep Specialist and  refer for management of therapy due to patient increased risk of Sleep Apnea. Ordering a sleep study due to the following two clinical symptoms: Excessive daytime sleepiness G47.10 / Gastroesophageal reflux K21.9 / Nocturia R35.1 / Morning Headaches G44.221 / Difficulty concentrating R41.840 / Memory problems or poor judgment G31.84 / Personality changes or irritability R45.4 / Loud snoring R06.83 / Depression F32.9 / Unrefreshed by sleep G47.8 / Impotence N52.9 / History of high blood pressure R03.0 / Insomnia G47.00    I understand that I am proceeding with a home sleep apnea test as ordered by my treating physician. I understand that untreated sleep apnea is a serious cardiovascular  risk factor and it is my responsibility to perform the test and seek management for sleep apnea. I will be contacted with the results and be managed for sleep apnea by a local sleep physician. I will be receiving equipment and further instructions from [facility]. I shall promptly ship back the equipment via the included mailing label. I understand my insurance will be billed for the test and as the patient I am responsible for any insurance related out-of-pocket costs incurred. I have been provided with written instructions and can call for additional video or telephonic instruction, with 24-hour availability of qualified personnel to answer any questions: Patient Help Desk [phone].  Patient Signature ______________________________________________________   Date______________________ Patient Telemedicine Verbal Consent

## 2021-02-06 NOTE — Telephone Encounter (Signed)
I s/w the pt and informed her that she is ok to proceed with [facility] that it did not need a PA. Gave her the PIN # [id number]. Pt will do the sleep study sometime this week. I assured her that once the study has been read by the cardiologist we will call her with the results and any recommendations. Pt thanked me for the call and my help today.   RE: Felicia [name] SLEEP STUDY Received: Today Lauralee Evener, [place], CMA She does not need a PA.

## 2021-02-06 NOTE — Telephone Encounter (Signed)
-----   Message from Tamsen Snider sent at 02/02/2021 12:19 PM EDT ----- Nicki Reaper please.  ----- Message ----- From: Michae Kava, CMA Sent: 02/02/2021  11:51 AM EDT To: George Hugh, RN  Hi ladies,  I will place the stop bang in when I have pt come in. Is Richardson Dopp, [facility] the ordering provider?  Thank you  Arbie Cookey  ----- Message ----- From: Tamsen Snider Sent: 02/02/2021  11:12 AM EDT To: Michae Kava, CMA  No mam I did not.  I did not she was getting a Biomedical scientist until Libby talked to pt.   ----- Message ----- From: Michae Kava, CMA Sent: 02/02/2021  10:25 AM EDT To: George Hugh, RN  Hi ladies,  I will reach out to the pt sometime today and set up for her to come in for [facility] set up. Danielle, did you put a stop bang in?   Thank You Arbie Cookey   ----- Message ----- From: Antonieta Iba, RN Sent: 02/02/2021  10:08 AM EDT To: Michae Kava, CMA, Tamsen Snider  Yes we can do the [facility] home test.   Arbie Cookey - I just spoke with her and she does have a smart phone. Could you arrange a time for her to come meet with you to set it up?  Thanks!! ----- Message ----- From: Tamsen Snider Sent: 02/02/2021   9:35 AM EDT To: Antonieta Iba, RN  Hey, Scott said that would be fine.  Can you do a home test?? I do not know if pt has a smart phone.  She is lovely. Can you call her please.  Look at her echo results.  Thanks Carlye.  I appreciate you.  ----- Message ----- From: Antonieta Iba, RN Sent: 02/02/2021   9:03 AM EDT To: Marko Stai,   Did Scott want to order a sleep study? Usually we like for the patient's to go ahead and have their sleep study done. Once we get the results then they will follow up with Dr. Radford Pax based on the findings.   Carly  ----- Message ----- From: Tamsen Snider Sent: 02/02/2021   8:53 AM EDT To: Antonieta Iba, RN, [facility]  This pt use to  wear a CPAP a couple years ago and could not tolerate the mask. I explained there is new technology out there. She has pulmonary HTN  and sleep apnea, Nicki Reaper thinks her pressures are elevated due to sleep apnea.    Scott wanted her to be seen by Dr. Radford Pax.  I could not get pt in anytime soon.  Carlyle, I told her you would call her with an appointment. I did not set pt up for a sleep study but order in placed in the system.   Thanks [employer],  [employer]

## 2021-02-08 ENCOUNTER — Encounter (INDEPENDENT_AMBULATORY_CARE_PROVIDER_SITE_OTHER): Payer: Medicare PPO | Admitting: Cardiology

## 2021-02-08 DIAGNOSIS — G4733 Obstructive sleep apnea (adult) (pediatric): Secondary | ICD-10-CM | POA: Diagnosis not present

## 2021-02-14 ENCOUNTER — Ambulatory Visit: Payer: Medicare PPO

## 2021-02-14 DIAGNOSIS — G4730 Sleep apnea, unspecified: Secondary | ICD-10-CM

## 2021-02-14 DIAGNOSIS — I272 Pulmonary hypertension, unspecified: Secondary | ICD-10-CM

## 2021-02-14 NOTE — Procedures (Signed)
    Sleep Study Report  Patient Information Study Date: 02/08/21 Patient Name: Felicia [name] Patient ID: [id number] Birth Date: June 15, 2040 Age: 80 Gender: Female BMI: 25.4 (W=134 lb, H=5' 1'') Neck Circ.: 75 '' Referring Physician: Richardson Dopp  TEST DESCRIPTION: Home sleep apnea testing was completed using the WatchPat, a Type 1 device, utilizing peripheral arterial tonometry (PAT), chest movement, actigraphy, pulse oximetry, pulse rate, body position and snore. AHI was calculated with apnea and hypopnea using valid sleep time as the denominator. RDI includes apneas, hypopneas, and RERAs. The data acquired and the scoring of sleep and all associated events were performed in accordance with the recommended standards and specifications as outlined in the AASM Manual for the Scoring of Sleep and Associated Events 2.2.0 (2015).  FINDINGS: 1. Mild Obstructive Sleep Apnea with AHI 12.2/hr. 2. Central Sleep Apnea with pAHIc 0.5/hr. 3. Oxygen desaturations as low as 79%. 4. Moderate snoring was present. O2 sats were < 88% for 17.1 min. 5. Total sleep time was 7 hrs and 50 min. 6. 26.7% of total sleep time was spent in REM sleep. 7. Normal sleep onset latency at 19 min. 8. Normal REM sleep onset latency at 81 min. 9. Total awakenings were 3.  DIAGNOSIS: Mild Obstructive Sleep Apnea (G47.33)  RECOMMENDATIONS: 1. Clinical correlation of these findings is necessary. The decision to treat obstructive sleep apnea (OSA) is usually based on the presence of apnea symptoms or the presence of associated medical conditions such as Hypertension, Congestive Heart Failure, Atrial Fibrillation or Obesity. The most common symptoms of OSA are snoring, gasping for breath while sleeping, daytime sleepiness and fatigue.  2. Initiating apnea therapy is recommended given the presence of symptoms and/or associated conditions. Recommend proceeding with one of the following:   a. Auto-CPAP therapy with a  pressure range of 5-20cm H2O.   b. An oral appliance (OA) that can be obtained from certain dentists with expertise in sleep medicine. These are primarily of use in non-obese patients with mild and moderate disease.   c. An ENT consultation which may be useful to look for specific causes of obstruction and possible treatment options.   d. If patient is intolerant to PAP therapy, consider referral to ENT for evaluation for hypoglossal nerve stimulator.  3. Close follow-up is necessary to ensure success with CPAP or oral appliance therapy for maximum benefit .  4. A follow-up oximetry study on CPAP is recommended to assess the adequacy of therapy and determine the need for supplemental oxygen or the potential need for Bi-level therapy. An arterial blood gas to determine the adequacy of baseline ventilation and oxygenation should also be considered.  5. Healthy sleep recommendations include: adequate nightly sleep (normal 7-9 hrs/night), avoidance of caffeine after noon and alcohol near bedtime, and maintaining a sleep environment that is cool, dark and quiet.  6. Weight loss for overweight patients is recommended. Even modest amounts of weight loss can significantly improve the severity of sleep apnea.  7. Snoring recommendations include: weight loss where appropriate, side sleeping, and avoidance of alcohol before bed.  8. Operation of motor vehicle should be avoided when sleepy.  Signature: Electronically Signed: 02/14/21 Fransico Him, MD; [facility]; [place], American Board of Sleep Medicine Report prepared by: Fransico Him

## 2021-02-16 ENCOUNTER — Ambulatory Visit
Admission: RE | Admit: 2021-02-16 | Discharge: 2021-02-16 | Disposition: A | Discharge status: Home / Self Care | Payer: Medicare PPO | Source: Ambulatory Visit | Attending: Obstetrics and Gynecology | Admitting: Obstetrics and Gynecology

## 2021-02-16 DIAGNOSIS — R1900 Intra-abdominal and pelvic swelling, mass and lump, unspecified site: Secondary | ICD-10-CM

## 2021-02-16 DIAGNOSIS — R1909 Other intra-abdominal and pelvic swelling, mass and lump: Secondary | ICD-10-CM | POA: Diagnosis not present

## 2021-02-16 DIAGNOSIS — D251 Intramural leiomyoma of uterus: Secondary | ICD-10-CM | POA: Diagnosis not present

## 2021-02-16 MED ORDER — GADOBENATE DIMEGLUMINE 529 MG/ML IV SOLN
12.0000 mL | Freq: Once | INTRAVENOUS | Status: AC | PRN
  Administered 2021-02-16: 12 mL via INTRAVENOUS

## 2021-02-20 ENCOUNTER — Telehealth: Payer: Self-pay | Admitting: Cardiology

## 2021-02-20 NOTE — Telephone Encounter (Signed)
Patient wanted to speak to Dr. Theodosia Blender Nurse regarding an appointment for a sleep study.

## 2021-02-21 ENCOUNTER — Telehealth: Payer: Self-pay | Admitting: *Deleted

## 2021-02-21 DIAGNOSIS — G4733 Obstructive sleep apnea (adult) (pediatric): Secondary | ICD-10-CM

## 2021-02-21 NOTE — Telephone Encounter (Signed)
-----   Message from Sueanne Margarita, MD sent at 02/14/2021  6:50 PM EDT ----- Please let patient know that they have sleep apnea and recommend treating with CPAP.  Please order an auto CPAP from 4-15cm H2O with heated humidity and mask of choice.  Order overnight pulse ox on CPAP.  Followup with me in 6 weeks.

## 2021-02-21 NOTE — Telephone Encounter (Signed)
The patient has been notified of the result and verbalized understanding.  All questions (if any) were answered. Marolyn Hammock, [place] 02/21/2021 5:07 PM    Upon patient request DME selection is [facility]. Patient understands he will be contacted by [facility] to set up his cpap. Patient understands to call if [facility] does not contact him with new setup in a timely manner. Patient understands they will be called once confirmation has been received from [facility] that they have received their new machine to schedule 10 week follow up appointment.   [place] notified of new cpap order  Please add to airview Patient was grateful for the call and thanked me

## 2021-02-22 ENCOUNTER — Other Ambulatory Visit: Payer: Self-pay | Admitting: Rheumatology

## 2021-02-22 NOTE — Telephone Encounter (Signed)
Next Visit: 06/26/2021  Last Visit: 01/22/2021  Last Fill: 03/13/2020  Dx: Rheumatoid arthritis involving multiple sites with positive rheumatoid factor  Current Dose per office note on 5/67/2091: folic acid 1 mg 2 tablets daily.   Okay to refill Folic Acid?

## 2021-03-01 NOTE — Telephone Encounter (Signed)
Return call: Left message on voicemail and informed patient to call back.

## 2021-03-01 NOTE — Telephone Encounter (Signed)
Felicia [name] SLEEP STUDY Received: 3 weeks ago Michae Kava, CMA  P Cv Div Sleep Studies; Freada Bergeron, CMA; Lauralee Evener, CMA  Hi ladies, Can someone see if pt needs a PA for her Felicia [name] Sleep Study. I appreciate your help.

## 2021-03-05 ENCOUNTER — Other Ambulatory Visit: Payer: Self-pay | Admitting: Physician Assistant

## 2021-03-05 DIAGNOSIS — M0579 Rheumatoid arthritis with rheumatoid factor of multiple sites without organ or systems involvement: Secondary | ICD-10-CM

## 2021-03-05 NOTE — Telephone Encounter (Signed)
Next Visit: 06/26/2021  Last Visit: 01/22/2021  Last Fill: 11/16/2020  YB:WLSLHTDSKA arthritis involving multiple sites with positive rheumatoid factor   Current Dose per office note 01/22/2021: Rinvoq 15 mg 1 tablet by mouth daily  Labs: 01/17/2021 Sodium 129, Chloride 97, Total Protein 6.4, WBC 10.6, RBC 3.48, Hgb 11.3, Hct 33.0  TB [place]: 01/22/2021  Indeterminate (Patient states she has had her TB skin test with her PCP. Patient states it was negative. She will call to have them fax resutls)  Okay to refill Rinvoq?

## 2021-03-05 NOTE — Telephone Encounter (Signed)
Please contact her PCPs office to get the results.

## 2021-03-05 NOTE — Telephone Encounter (Signed)
Contacted PCP and requested TB Skin test. They state they will fax it.

## 2021-03-09 ENCOUNTER — Other Ambulatory Visit: Payer: Self-pay | Admitting: Internal Medicine

## 2021-03-22 DIAGNOSIS — H409 Unspecified glaucoma: Secondary | ICD-10-CM | POA: Diagnosis not present

## 2021-03-22 DIAGNOSIS — H401233 Low-tension glaucoma, bilateral, severe stage: Secondary | ICD-10-CM | POA: Diagnosis not present

## 2021-03-22 DIAGNOSIS — H04123 Dry eye syndrome of bilateral lacrimal glands: Secondary | ICD-10-CM | POA: Diagnosis not present

## 2021-03-23 NOTE — Telephone Encounter (Signed)
NO PA required for HST  for [facility].

## 2021-03-23 NOTE — Telephone Encounter (Signed)
Appointment set for patient for 05/01/21 to discuss sleep study results.

## 2021-03-27 DIAGNOSIS — I10 Essential (primary) hypertension: Secondary | ICD-10-CM | POA: Diagnosis not present

## 2021-03-27 DIAGNOSIS — R7303 Prediabetes: Secondary | ICD-10-CM | POA: Diagnosis not present

## 2021-03-27 DIAGNOSIS — M069 Rheumatoid arthritis, unspecified: Secondary | ICD-10-CM | POA: Diagnosis not present

## 2021-03-27 DIAGNOSIS — Z0000 Encounter for general adult medical examination without abnormal findings: Secondary | ICD-10-CM | POA: Diagnosis not present

## 2021-03-27 DIAGNOSIS — D649 Anemia, unspecified: Secondary | ICD-10-CM | POA: Diagnosis not present

## 2021-03-27 DIAGNOSIS — I25119 Atherosclerotic heart disease of native coronary artery with unspecified angina pectoris: Secondary | ICD-10-CM | POA: Diagnosis not present

## 2021-03-27 DIAGNOSIS — M810 Age-related osteoporosis without current pathological fracture: Secondary | ICD-10-CM | POA: Diagnosis not present

## 2021-03-28 ENCOUNTER — Telehealth: Payer: Self-pay

## 2021-03-28 DIAGNOSIS — M5416 Radiculopathy, lumbar region: Secondary | ICD-10-CM

## 2021-03-28 NOTE — Telephone Encounter (Signed)
Pt called and would like to schedule an apt with Dr. Ernestina Patches
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
## 2021-03-29 NOTE — Telephone Encounter (Signed)
Patient states that pain has returned in the same area. Referral placed and patient is aware that we will call her to schedule when authorized by insurance.
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
## 2021-04-12 ENCOUNTER — Ambulatory Visit: Payer: Medicare PPO | Admitting: Interventional Cardiology

## 2021-04-12 ENCOUNTER — Encounter: Payer: Self-pay | Admitting: Interventional Cardiology

## 2021-04-12 ENCOUNTER — Other Ambulatory Visit: Payer: Medicare PPO | Admitting: *Deleted

## 2021-04-12 ENCOUNTER — Other Ambulatory Visit: Payer: Self-pay

## 2021-04-12 VITALS — BP 130/80 | HR 67 | Ht 65.0 in | Wt 138.0 lb

## 2021-04-12 DIAGNOSIS — I351 Nonrheumatic aortic (valve) insufficiency: Secondary | ICD-10-CM

## 2021-04-12 DIAGNOSIS — I272 Pulmonary hypertension, unspecified: Secondary | ICD-10-CM

## 2021-04-12 DIAGNOSIS — G4733 Obstructive sleep apnea (adult) (pediatric): Secondary | ICD-10-CM | POA: Diagnosis not present

## 2021-04-12 DIAGNOSIS — I10 Essential (primary) hypertension: Secondary | ICD-10-CM

## 2021-04-12 DIAGNOSIS — I2510 Atherosclerotic heart disease of native coronary artery without angina pectoris: Secondary | ICD-10-CM | POA: Diagnosis not present

## 2021-04-12 DIAGNOSIS — E785 Hyperlipidemia, unspecified: Secondary | ICD-10-CM | POA: Diagnosis not present

## 2021-04-12 LAB — LIPID PANEL
Chol/HDL Ratio: 3.1 ratio (ref 0.0–4.4)
Cholesterol, Total: 192 mg/dL (ref 100–199)
HDL: 61 mg/dL (ref >39)
LDL Chol Calc (NIH): 113 mg/dL — ABNORMAL HIGH (ref 0–99)
Triglycerides: 99 mg/dL (ref 0–149)
VLDL Cholesterol Cal: 18 mg/dL (ref 5–40)

## 2021-04-12 LAB — HEPATIC FUNCTION PANEL
ALT: 19 IU/L (ref 0–32)
AST: 25 IU/L (ref 0–40)
Albumin: 4.2 g/dL (ref 3.7–4.7)
Alkaline Phosphatase: 55 IU/L (ref 44–121)
Bilirubin Total: 0.5 mg/dL (ref 0.0–1.2)
Bilirubin, Direct: 0.14 mg/dL (ref 0.00–0.40)
Total Protein: 6.3 g/dL (ref 6.0–8.5)

## 2021-04-12 NOTE — Patient Instructions (Signed)

## 2021-04-16 ENCOUNTER — Other Ambulatory Visit: Payer: Self-pay | Admitting: *Deleted

## 2021-04-16 DIAGNOSIS — E785 Hyperlipidemia, unspecified: Secondary | ICD-10-CM

## 2021-04-19 ENCOUNTER — Ambulatory Visit (INDEPENDENT_AMBULATORY_CARE_PROVIDER_SITE_OTHER): Payer: Medicare PPO | Admitting: Physical Medicine and Rehabilitation

## 2021-04-19 ENCOUNTER — Other Ambulatory Visit: Payer: Self-pay

## 2021-04-19 ENCOUNTER — Ambulatory Visit: Payer: Self-pay

## 2021-04-19 ENCOUNTER — Encounter: Payer: Self-pay | Admitting: Physical Medicine and Rehabilitation

## 2021-04-19 VITALS — BP 165/72 | HR 70

## 2021-04-19 DIAGNOSIS — M5416 Radiculopathy, lumbar region: Secondary | ICD-10-CM | POA: Diagnosis not present

## 2021-04-19 MED ORDER — METHYLPREDNISOLONE ACETATE 80 MG/ML IJ SUSP
80.0000 mg | Freq: Once | INTRAMUSCULAR | Status: AC
  Administered 2021-04-19: 14:00:00 80 mg

## 2021-04-19 NOTE — Progress Notes (Signed)
Pt state lower back pain that travels to her left leg and knee. Pt state when sitting for a few minutes pt feels pain when she tries to get up. Pt state she tries to walk around and uses heating pads to help ease the pain. Pt has hx of inj on 07/31/20 pt state it helped.  Numeric Pain Rating Scale and Functional Assessment Average Pain 7   In the last MONTH (on 0-10 scale) has pain interfered with the following?  1. General activity like being  able to carry out your everyday physical activities such as walking, climbing stairs, carrying groceries, or moving a chair?  Rating(10)   +Driver, -BT, -Dye Allergies.

## 2021-04-19 NOTE — Patient Instructions (Signed)

## 2021-04-20 ENCOUNTER — Other Ambulatory Visit: Payer: Self-pay | Admitting: *Deleted

## 2021-04-20 DIAGNOSIS — Z79899 Other long term (current) drug therapy: Secondary | ICD-10-CM | POA: Diagnosis not present

## 2021-04-21 LAB — CBC WITH DIFFERENTIAL/PLATELET
Absolute Monocytes: 722 cells/uL (ref 200–950)
Basophils Absolute: 48 cells/uL (ref 0–200)
Basophils Relative: 0.5 %
Eosinophils Absolute: 105 cells/uL (ref 15–500)
Eosinophils Relative: 1.1 %
HCT: 34.4 % — ABNORMAL LOW (ref 35.0–45.0)
Hemoglobin: 11.6 g/dL — ABNORMAL LOW (ref 11.7–15.5)
Lymphs Abs: 2280 cells/uL (ref 850–3900)
MCH: 32.4 pg (ref 27.0–33.0)
MCHC: 33.7 g/dL (ref 32.0–36.0)
MCV: 96.1 fL (ref 80.0–100.0)
MPV: 9.7 fL (ref 7.5–12.5)
Monocytes Relative: 7.6 %
Neutro Abs: 6346 cells/uL (ref 1500–7800)
Neutrophils Relative %: 66.8 %
Platelets: 410 10*3/uL — ABNORMAL HIGH (ref 140–400)
RBC: 3.58 10*6/uL — ABNORMAL LOW (ref 3.80–5.10)
RDW: 13.1 % (ref 11.0–15.0)
Total Lymphocyte: 24 %
WBC: 9.5 10*3/uL (ref 3.8–10.8)

## 2021-04-21 LAB — COMPLETE METABOLIC PANEL WITH GFR
AG Ratio: 1.5 (calc) (ref 1.0–2.5)
ALT: 18 U/L (ref 6–29)
AST: 22 U/L (ref 10–35)
Albumin: 4 g/dL (ref 3.6–5.1)
Alkaline phosphatase (APISO): 57 U/L (ref 37–153)
BUN: 20 mg/dL (ref 7–25)
CO2: 23 mmol/L (ref 20–32)
Calcium: 10 mg/dL (ref 8.6–10.4)
Chloride: 103 mmol/L (ref 98–110)
Creat: 0.88 mg/dL (ref 0.60–1.00)
Globulin: 2.7 g/dL (calc) (ref 1.9–3.7)
Glucose, Bld: 94 mg/dL (ref 65–99)
Potassium: 4.5 mmol/L (ref 3.5–5.3)
Sodium: 135 mmol/L (ref 135–146)
Total Bilirubin: 0.3 mg/dL (ref 0.2–1.2)
Total Protein: 6.7 g/dL (ref 6.1–8.1)
eGFR: 67 mL/min/{1.73_m2} (ref >=60)

## 2021-05-01 ENCOUNTER — Other Ambulatory Visit: Payer: Self-pay

## 2021-05-01 ENCOUNTER — Encounter: Payer: Self-pay | Admitting: Cardiology

## 2021-05-01 ENCOUNTER — Telehealth (INDEPENDENT_AMBULATORY_CARE_PROVIDER_SITE_OTHER): Payer: Medicare PPO | Admitting: Cardiology

## 2021-05-01 VITALS — Ht 65.0 in | Wt 132.0 lb

## 2021-05-01 DIAGNOSIS — G4733 Obstructive sleep apnea (adult) (pediatric): Secondary | ICD-10-CM | POA: Diagnosis not present

## 2021-05-01 DIAGNOSIS — I10 Essential (primary) hypertension: Secondary | ICD-10-CM | POA: Diagnosis not present

## 2021-05-01 NOTE — Progress Notes (Signed)
Virtual Visit via Video Note   This visit type was conducted due to national recommendations for restrictions regarding the COVID-19 Pandemic (e.g. social distancing) in an effort to limit this patient's exposure and mitigate transmission in our community.  Due to her co-morbid illnesses, this patient is at least at moderate risk for complications without adequate follow up.  This format is felt to be most appropriate for this patient at this time.  All issues noted in this document were discussed and addressed.  A limited physical exam was performed with this format.  Please refer to the patient's chart for her consent to telehealth for [facility].   Date:  05/01/2021   ID:  Felicia [name], DOB [birth year], MRN [id number] The patient was identified using 2 identifiers.  Patient Location: Home Provider Location: Home Office   PCP:  Kathyrn Lass, MD   [facility] Providers Cardiologist:  Sinclair Grooms, MD     Evaluation Performed:  Follow-Up Visit  Chief Complaint:  OSA  History of Present Illness:    Felicia [name] is a 80 y.o. female with a hx of GERD, HTN, RA and pulmonary HTN who was referred for sleep study.  Home sleep study showed mild OSA with an AHI of 12.2/hr and nocturnal hypoxemia with Nadir O2 sat 79% and O2 sats < 88% for 17 minutes. CPA was recommended due to her pulmonary HTN but she wanted to discuss results first. She tells me that she had been on CPAP back in 2013 but had difficulty finding a mask that fit her well enough and she would get wrapped up in the hose and her mask would leak a lot and she stopped using it.   The patient does not have symptoms concerning for COVID-19 infection (fever, chills, cough, or new shortness of breath).    Past Medical History:  Diagnosis Date   Adenomatous colon polyp 2004   Colo in 2009 "no polyps"   Complication of anesthesia    small airway per patient   Echocardiogram    Echo 8/22 EF 60-65, no RWMA, mild LVH,  normal RVSF, moderately elevated PASP (RVSP 50.9), trivial MR, mild AI, AV sclerosis without stenosis   GERD (gastroesophageal reflux disease)    Glaucoma    Hiatal hernia    Hypertension    Neuropathy    Nuclear stress test    Myoview 8/22: EF 65, no ischemia; low risk   Rheumatoid arthritis(714.0)    Orencia; Methotrexate (Dr. Patrecia Pour)   Sleep apnea    on CPAP   Past Surgical History:  Procedure Laterality Date   CARDIAC CATHETERIZATION N/A 03/29/2015   Procedure: Left Heart Cath and Coronary Angiography;  Surgeon: Belva Crome, MD;  Location: [place] [facility];  Service: Cardiovascular;  Laterality: N/A;   CATARACT EXTRACTION, BILATERAL     COLONOSCOPY W/ POLYPECTOMY     X1; negative subsequently   DILATION AND CURETTAGE OF UTERUS     EYE SURGERY     due to RA   EYE SURGERY Left 06/2017   eyelid tendon    HYSTEROSCOPY WITH D & C  06/24/2012   Procedure: DILATATION AND CURETTAGE /HYSTEROSCOPY;  Surgeon: [name], MD;  Location: [facility];  Service: Gynecology;  Laterality: N/A;   SHOULDER SURGERY Left    due to RA   SQUAMOUS CELL CARCINOMA EXCISION     scalp and left leg   TRABECULECTOMY     X 2 OD; X 1 OS  UPPER GASTROINTESTINAL ENDOSCOPY     hiatal hernia     Current Meds  Medication Sig   Apoaequorin (PREVAGEN PO) Take 1 tablet by mouth daily.   Calcium Carbonate-Vitamin D 600-400 MG-UNIT tablet Take 2 tablets by mouth daily.    Carboxymethylcellulose Sodium (REFRESH OP) Apply 1 drop to eye daily as needed (FOR DRY EYES).   Cholecalciferol (VITAMIN D3) 1000 UNITS CAPS Take 1 capsule by mouth every other day.   Cyanocobalamin (VITAMIN B-12) 2500 MCG SUBL Place 1 each under the tongue daily.   diclofenac Sodium (VOLTAREN) 1 % GEL Apply 2 g topically 3 (three) times daily.   esomeprazole (NEXIUM) 40 MG capsule TAKE ONE CAPSULE BY MOUTH TWICE DAILY BEFORE A MEAL   estradiol (ESTRACE) 1 MG tablet Take 1 mg by mouth daily.   ezetimibe (ZETIA) 10 MG tablet  Take 1 tablet (10 mg total) by mouth daily.   folic acid (FOLVITE) 1 MG tablet TAKE 2 TABLETS(2 MG) BY MOUTH DAILY   gabapentin (NEURONTIN) 100 MG capsule Take 100 mg by mouth 2 (two) times daily.   Krill Oil Omega-3 300 MG CAPS Take 1 capsule by mouth daily.   latanoprost (XALATAN) 0.005 % ophthalmic solution Place 1 drop into both eyes at bedtime.   losartan (COZAAR) 100 MG tablet Take 100 mg by mouth daily.   methotrexate (RHEUMATREX) 2.5 MG tablet TAKE 6 TABLETS BY MOUTH ONCE WEEKLY   metoprolol (LOPRESSOR) 50 MG tablet Take 50 mg by mouth daily.    metroNIDAZOLE (METROGEL) 0.75 % gel Apply 1 application topically 2 (two) times daily.    MULTIPLE VITAMIN PO Take 1 tablet by mouth daily.   Polyethylene Glycol 3350 (MIRALAX PO) Take 1 Dose by mouth as needed.   progesterone (PROMETRIUM) 200 MG capsule Take 200 mg by mouth daily.   RINVOQ 15 MG TB24 TAKE 1 TABLET BY MOUTH EVERY DAY   valACYclovir (VALTREX) 1000 MG tablet Take 1,000 mg by mouth as needed (fever blister). Reported on 06/14/2015   Wheat Dextrin (BENEFIBER DRINK MIX PO) Take 1 Scoop by mouth at bedtime.   Zoledronic Acid (RECLAST IV) Inject into the vein.   Current Facility-Administered Medications for the 05/01/21 encounter (Video Visit) with Sueanne Margarita, MD  Medication   methylPREDNISolone acetate (DEPO-MEDROL) injection 80 mg     Allergies:   Sulfonamide derivatives, Amlodipine, and Norvasc [amlodipine besylate]   Social History   Tobacco Use   Smoking status: Former    Packs/day: 0.50    Years: 5.00    Pack years: 2.50    Types: Cigarettes    Quit date: 06/04/1971    Years since quitting: 49.9   Smokeless tobacco: Never   Tobacco comments:    smoked about 3-5 years , up to 1/2 ppd  Vaping Use   Vaping Use: Never used  Substance Use Topics   Alcohol use: No   Drug use: No     Family Hx: The patient's family history includes Dementia in her mother; Heart attack (age of onset: 25) in her father; Heart  disease in her mother; Lung disease in her mother; Osteoporosis in her mother; Stroke (age of onset: 74) in her maternal grandmother; Ulcerative colitis in her brother. There is no history of Diabetes, Cancer, Colon cancer, Esophageal cancer, Stomach cancer, Pancreatic cancer, or Liver disease.  ROS:   Please see the history of present illness.     All other systems reviewed and are negative.   Prior CV studies:   The  following studies were reviewed today:  Home sleep study  Labs/Other Tests and Data Reviewed:    EKG:  No ECG reviewed.  Recent Labs: 01/03/2021: TSH 2.060 04/20/2021: ALT 18; BUN 20; Creat 0.88; Hemoglobin 11.6; Platelets 410; Potassium 4.5; Sodium 135   Recent Lipid Panel Lab Results  Component Value Date/Time   CHOL 192 04/12/2021 10:39 AM   TRIG 99 04/12/2021 10:39 AM   HDL 61 04/12/2021 10:39 AM   CHOLHDL 3.1 04/12/2021 10:39 AM   CHOLHDL 3 09/02/2012 08:26 AM   LDLCALC 113 (H) 04/12/2021 10:39 AM   LDLDIRECT 160.4 12/14/2009 12:06 PM    Wt Readings from Last 3 Encounters:  05/01/21 132 lb (59.9 kg)  04/12/21 138 lb (62.6 kg)  01/23/21 135 lb (61.2 kg)     Risk Assessment/Calculations:          Objective:    Vital Signs:  Ht 5\' 5"  (1.651 m)   Wt 132 lb (59.9 kg)   BMI 21.97 kg/m    VITAL SIGNS:  reviewed GEN:  no acute distress EYES:  sclerae anicteric, EOMI - Extraocular Movements Intact RESPIRATORY:  normal respiratory effort, symmetric expansion CARDIOVASCULAR:  no peripheral edema SKIN:  no rash, lesions or ulcers. MUSCULOSKELETAL:  no obvious deformities. NEURO:  alert and oriented x 3, no obvious focal deficit PSYCH:  normal affect  ASSESSMENT & PLAN:    OSA -home sleep study showed mild OSA with AHI 12.2/hr but also has nocturnal hypoxemia -in setting of pulmonary HTN and nocturnal hypoxemia I have recommended treatment of her OSA with CPAP.  I discussed the pathophysiology behind OSA and nocturnal hypoxemia in development or  worsening of PHTN.  -I will set her up on ResMed auto CPAP from 4 to 15cm H2O with heated humidity and mask of choice -get a download in 4 weeks and get an overnight pulse ox on CPAP to make sure there is no residual hypoxemia -refer to ENT to rule out surgical causes of OSA  2.  HTN -BP controlled on exam today -continue prescription drug management with Losartan 100mg  daily and Lopressor 50mg  daily with PRN refills  COVID-19 Education: The signs and symptoms of COVID-19 were discussed with the patient and how to seek care for testing (follow up with PCP or arrange E-visit).  The importance of social distancing was discussed today.  Time:   Today, I have spent 20 minutes with the patient with telehealth technology discussing the above problems.     Medication Adjustments/Labs and Tests Ordered: Current medicines are reviewed at length with the patient today.  Concerns regarding medicines are outlined above.   Tests Ordered: No orders of the defined types were placed in this encounter.   Medication Changes: No orders of the defined types were placed in this encounter.   Follow Up:  In Person in 6 week(s) after she gets her PAP device  Signed, Fransico Him, MD  05/01/2021 10:16 AM    [place]

## 2021-05-01 NOTE — Patient Instructions (Signed)
Medication Instructions:  Your physician recommends that you continue on your current medications as directed. Please refer to the Current Medication list given to you today.  *If you need a refill on your cardiac medications before your next appointment, please call your pharmacy*  Follow-Up: At [facility], you and your health needs are our priority.  As part of our continuing mission to provide you with exceptional heart care, we have created designated Provider Care Teams.  These Care Teams include your primary Cardiologist (physician) and Advanced Practice Providers (APPs -  Physician Assistants and Nurse Practitioners) who all work together to provide you with the care you need, when you need it.  Your next appointment:   6 weeks after receiving your new deivce  Provider: Dr. Radford Pax 1}   Other Instructions You have been referred to an ENT specialist.

## 2021-05-01 NOTE — Addendum Note (Signed)
Addended by: Antonieta Iba on: 05/01/2021 10:30 AM   Modules accepted: Orders

## 2021-05-03 ENCOUNTER — Telehealth: Payer: Self-pay | Admitting: *Deleted

## 2021-05-03 DIAGNOSIS — G4733 Obstructive sleep apnea (adult) (pediatric): Secondary | ICD-10-CM

## 2021-05-03 NOTE — Telephone Encounter (Signed)
-----   Message from Felicia [name], [place] sent at 05/01/2021 10:20 AM EST ----- Please order ResMed auto CPAP from 4 to 15cm H2O with heated humidity and mask of choice -get a download in 4 weeks and get an overnight pulse ox on CPAP to make sure there is no residual hypoxemia>>followup with me in 6 weeks after she gets her device. Thanks!

## 2021-05-03 NOTE — Telephone Encounter (Signed)
Order placed to [facility] via community message. 

## 2021-05-07 ENCOUNTER — Other Ambulatory Visit: Payer: Self-pay | Admitting: Rheumatology

## 2021-05-07 NOTE — Telephone Encounter (Signed)
Next Visit: 06/26/2021   Last Visit: 01/22/2021   Last Fill: 01/16/2021  [id number] arthritis involving multiple sites with positive rheumatoid factor    Current Dose per office note 01/22/2021: Methotrexate 6 tablets by mouth every 7 days  Labs: 04/20/2021 CMP WNL.  Anemia has improved.  Plt count remains borderline elevated but stable.    Okay to refill MTX?

## 2021-05-08 ENCOUNTER — Encounter: Payer: Self-pay | Admitting: Pharmacist

## 2021-05-08 ENCOUNTER — Other Ambulatory Visit: Payer: Self-pay

## 2021-05-08 ENCOUNTER — Ambulatory Visit: Payer: Medicare PPO | Admitting: Pharmacist

## 2021-05-08 DIAGNOSIS — E785 Hyperlipidemia, unspecified: Secondary | ICD-10-CM | POA: Diagnosis not present

## 2021-05-08 MED ORDER — NEXLIZET 180-10 MG PO TABS: 1.0000 | ORAL_TABLET | Freq: Every day | ORAL | 3 refills | Status: DC

## 2021-05-08 NOTE — Patient Instructions (Addendum)
It was nice to meet you today!  Your LDL cholesterol is 113 and your goal is < 70  You can stop your krill oil  I'll submit information to your insurance for Nexlizet. This is a daily pill that would replace your ezetimibe and lower your LDL an additional 20%.  I'll call you when I hear back from your insurance.   We will recheck your cholesterol on Tuesday, February 28th. Come in any time after 7:30am for fasting labs  Other options include: -Repatha: subcutaneous injection given at home every 2 weeks. Lowers your LDL cholesterol by 60%. Would cost $40/1 month or $80/3 months -Leqvio: subcutaneous injection given at [facility] at month 0 and 3, then every 6 months. Lowers your LDL by 50% and copay would likely be $40 per injection  Call Jinny Blossom, PharmD with any concerns [phone]

## 2021-05-08 NOTE — Progress Notes (Signed)
Patient ID: Felicia [name]                 DOB: 1941-02-28                    MRN: [id number]     HPI: Felicia [name] is a 80 y.o. female patient of Dr Tamala Julian referred to lipid clinic by Richardson Dopp, PA. PMH is significant for CAD with cath 03/2015 showing 45% prox to mid LAD stenosis, HTN, OSA, RA, and GERD. Stress test 01/2021 was low risk, EF was 65%. Lipid panel drawn last month shows elevated LDL and pt was referred to lipid clinic for further management.  Pt presents today in good spirits. Has been tolerating her ezetimibe well, has been taking this for the past few months. Previously took rosuvastatin and pravastatin but they caused fatigue and weakness. Previously gave herself injections for her RA, difficult due to dexterity.  Current Medications: ezetimibe 10mg  daily  Intolerances: pravastatin 20mg  daily, rosuvastatin 10mg  daily and 3x/week Risk Factors: CAD, HTN, age, FHx of CAD LDL goal: 70mg /dL  Family History: Dementia in her mother; Heart attack (age of onset: 70) in her father; Heart disease in her mother; Lung disease in her mother; Osteoporosis in her mother; Stroke (age of onset: 83) in her maternal grandmother; Ulcerative colitis in her brother.  Social History: Former smoker 1/2 PPD for 2.5 years, quit in 1973, denies alcohol and drug use.  Labs: 04/12/21: TC 192, TG 99, HDL 61, LDL 113 (ezetimibe 10mg  daily)  Past Medical History:  Diagnosis Date   Adenomatous colon polyp 2004   Colo in 2009 "no polyps"   Complication of anesthesia    small airway per patient   Echocardiogram    Echo 8/22 EF 60-65, no RWMA, mild LVH, normal RVSF, moderately elevated PASP (RVSP 50.9), trivial MR, mild AI, AV sclerosis without stenosis   GERD (gastroesophageal reflux disease)    Glaucoma    Hiatal hernia    Hypertension    Neuropathy    Nuclear stress test    Myoview 8/22: EF 65, no ischemia; low risk   Rheumatoid arthritis(714.0)    Orencia; Methotrexate (Dr. Patrecia Pour)   Sleep  apnea    on CPAP    Current Outpatient Medications on File Prior to Visit  Medication Sig Dispense Refill   Apoaequorin (PREVAGEN PO) Take 1 tablet by mouth daily.     Calcium Carbonate-Vitamin D 600-400 MG-UNIT tablet Take 2 tablets by mouth daily.      Carboxymethylcellulose Sodium (REFRESH OP) Apply 1 drop to eye daily as needed (FOR DRY EYES).     Cholecalciferol (VITAMIN D3) 1000 UNITS CAPS Take 1 capsule by mouth every other day.     Cyanocobalamin (VITAMIN B-12) 2500 MCG SUBL Place 1 each under the tongue daily.     diclofenac Sodium (VOLTAREN) 1 % GEL Apply 2 g topically 3 (three) times daily.     esomeprazole (NEXIUM) 40 MG capsule TAKE ONE CAPSULE BY MOUTH TWICE DAILY BEFORE A MEAL 180 capsule 3   estradiol (ESTRACE) 1 MG tablet Take 1 mg by mouth daily.  0   ezetimibe (ZETIA) 10 MG tablet Take 1 tablet (10 mg total) by mouth daily. 90 tablet 3   folic acid (FOLVITE) 1 MG tablet TAKE 2 TABLETS(2 MG) BY MOUTH DAILY 180 tablet 3   gabapentin (NEURONTIN) 100 MG capsule Take 100 mg by mouth 2 (two) times daily.     Krill Oil Omega-3 300 MG  CAPS Take 1 capsule by mouth daily.     latanoprost (XALATAN) 0.005 % ophthalmic solution Place 1 drop into both eyes at bedtime.     losartan (COZAAR) 100 MG tablet Take 100 mg by mouth daily.     methotrexate (RHEUMATREX) 2.5 MG tablet TAKE 6 TABLETS BY MOUTH 1 TIME WEEKLY 72 tablet 0   metoprolol (LOPRESSOR) 50 MG tablet Take 50 mg by mouth daily.   0   metroNIDAZOLE (METROGEL) 0.75 % gel Apply 1 application topically 2 (two) times daily.      MULTIPLE VITAMIN PO Take 1 tablet by mouth daily.     Polyethylene Glycol 3350 (MIRALAX PO) Take 1 Dose by mouth as needed.     progesterone (PROMETRIUM) 200 MG capsule Take 200 mg by mouth daily.     RINVOQ 15 MG TB24 TAKE 1 TABLET BY MOUTH EVERY DAY 30 tablet 2   valACYclovir (VALTREX) 1000 MG tablet Take 1,000 mg by mouth as needed (fever blister). Reported on 06/14/2015     Wheat Dextrin (BENEFIBER  DRINK MIX PO) Take 1 Scoop by mouth at bedtime.     Zoledronic Acid (RECLAST IV) Inject into the vein.     Current Facility-Administered Medications on File Prior to Visit  Medication Dose Route Frequency Provider Last Rate Last Admin   methylPREDNISolone acetate (DEPO-MEDROL) injection 80 mg  80 mg Other Once Magnus Sinning, MD        Allergies  Allergen Reactions   Sulfonamide Derivatives     Rash, redness   Amlodipine Swelling   Norvasc [Amlodipine Besylate]     edema    Assessment/Plan:  1. Hyperlipidemia - LDL 113 on ezetimibe 10mg  daily. Goal < 70 due to ASCVD. Pt is intolerant to low dose pravastatin and rosuvastatin. Discussed Nexlizet, Repatha, and Leqvio today. Pt prefers to try Nexlizet and work on dietary improvements. Will recheck lipids in 2-3 months. Mentions that her RA may make Repatha injections difficult. Prior auth for Nexlizet has been submitted and approved through 06/02/22, this will replace her ezetimibe. Copay is $40/1 month or $80/3 month supply.  Felicia [name], PharmD, BCACP, [place] [address] [place], [place] [address] Phone: [phone]; Fax: [phone] 05/08/2021 12:00 PM

## 2021-05-15 ENCOUNTER — Telehealth: Payer: Self-pay

## 2021-05-15 NOTE — Telephone Encounter (Signed)
PA renewal created automatically by CMM.  Previous CMM Key [id number] shows coverage from 06/03/2020 to 06/02/2021 under Auth# [id number].  Request submitted today has been canceled due to current approval authorization from 06/03/2020 to 06/02/2022. It is unclear what the current Auth# is.

## 2021-05-16 NOTE — Progress Notes (Signed)
Felicia [name] - 80 y.o. female MRN [id number]  Date of birth: 1940-08-30  Office Visit Note: Visit Date: 04/19/2021 PCP: Kathyrn Lass, MD Referred by: Kathyrn Lass, MD  Subjective: Chief Complaint  Patient presents with   Lower Back - Pain   Left Leg - Pain   Left Knee - Pain   HPI:  Felicia [name] is a 80 y.o. female who comes in today for planned repeat Left L4-5  Lumbar Transforaminal epidural steroid injection with fluoroscopic guidance.  The patient has failed conservative care including home exercise, medications, time and activity modification.  This injection will be diagnostic and hopefully therapeutic.  Please see requesting physician notes for further details and justification. Patient received more than 50% pain relief from prior injection. MRI reviewed with images and spine model.  MRI reviewed in the note below.   Referring: Dr. Joni Fears   ROS Otherwise per HPI.  Assessment & Plan: Visit Diagnoses:    ICD-10-CM   1. Lumbar radiculopathy  M54.16 XR C-ARM NO REPORT    Epidural Steroid injection    methylPREDNISolone acetate (DEPO-MEDROL) injection 80 mg      Plan: No additional findings.   Meds & Orders:  Meds ordered this encounter  Medications   methylPREDNISolone acetate (DEPO-MEDROL) injection 80 mg    Orders Placed This Encounter  Procedures   XR C-ARM NO REPORT   Epidural Steroid injection    Follow-up: Return if symptoms worsen or fail to improve.   Procedures: No procedures performed  Lumbosacral Transforaminal Epidural Steroid Injection - Sub-Pedicular Approach with Fluoroscopic Guidance  Patient: Felicia [name]      Date of Birth: 02/10/1941 MRN: [id number] PCP: Kathyrn Lass, MD      Visit Date: 04/19/2021   Universal Protocol:    Date/Time: 04/19/2021  Consent Given By: the patient  Position: PRONE  Additional Comments: Vital signs were monitored before and after the procedure. Patient was prepped and draped in the usual sterile  fashion. The correct patient, procedure, and site was verified.   Injection Procedure Details:   Procedure diagnoses: Lumbar radiculopathy [M54.16]    Meds Administered:  Meds ordered this encounter  Medications   methylPREDNISolone acetate (DEPO-MEDROL) injection 80 mg    Laterality: Left  Location/Site: L4  Needle:5.0 in., 22 ga.  Short bevel or Quincke spinal needle  Needle Placement: Transforaminal  Findings:    -Comments: Excellent flow of contrast along the nerve, nerve root and into the epidural space.  Procedure Details: After squaring off the end-plates to get a true AP view, the C-arm was positioned so that an oblique view of the foramen as noted above was visualized. The target area is just inferior to the "nose of the scotty dog" or sub pedicular. The soft tissues overlying this structure were infiltrated with 2-3 ml. of 1% Lidocaine without Epinephrine.  The spinal needle was inserted toward the target using a "trajectory" view along the fluoroscope beam.  Under AP and lateral visualization, the needle was advanced so it did not puncture dura and was located close the 6 O'Clock position of the pedical in AP tracterory. Biplanar projections were used to confirm position. Aspiration was confirmed to be negative for CSF and/or blood. A 1-2 ml. volume of Isovue-250 was injected and flow of contrast was noted at each level. Radiographs were obtained for documentation purposes.   After attaining the desired flow of contrast documented above, a 0.5 to 1.0 ml test dose of 0.25% Marcaine was injected into  each respective transforaminal space.  The patient was observed for 90 seconds post injection.  After no sensory deficits were reported, and normal lower extremity motor function was noted,   the above injectate was administered so that equal amounts of the injectate were placed at each foramen (level) into the transforaminal epidural space.   Additional Comments:  No  complications occurred Dressing: 2 x 2 sterile gauze and Band-Aid    Post-procedure details: Patient was observed during the procedure. Post-procedure instructions were reviewed.  Patient left the clinic in stable condition.   Clinical History: MRI LUMBAR SPINE WITHOUT CONTRAST   TECHNIQUE: Multiplanar, multisequence MR imaging of the lumbar spine was performed. No intravenous contrast was administered.   COMPARISON:  Radiography 05/24/2019   FINDINGS: Segmentation:  5 lumbar type vertebrae based on prior radiography   Alignment: Dextroscoliosis. Grade 1 anterolisthesis at L3-4 and L4-5.   Vertebrae: Mild discogenic marrow signal at L2-3, L4-5, and L5-S1. Mild left facet marrow edema at L4-5.   Conus medullaris and cauda equina: Conus extends to the L1-2 level. Conus and cauda equina appear normal.   Paraspinal and other soft tissues: Negative for perispinal mass or inflammation. There is a persistent hypointensity at the level of the minimally covered uterus suggesting fibroid.   Disc levels:   T12- L1: Unremarkable.   L1-L2: Unremarkable.   L2-L3: Disc collapse asymmetric to the left with endplate ridging and disc bulging. Mild left facet spurring.   L3-L4: Disc narrowing and bulging with mild facet spurring.   L4-L5: Facet osteoarthritis with spurring and mild anterolisthesis. Disc space narrowing and bulging with superiorly migrating left paracentral extrusion reaching the upper half of the L4 vertebral body and impinging on the descending L4 nerve root. Disc material and facet spurring also impinges on the descending left L5 nerve root.   L5-S1:Disc narrowing and mild bulging. Minor facet spurring. No neural compression.   IMPRESSION: 1. Multilevel disc and facet degeneration with levoscoliosis and L3-4, L4-5 anterolisthesis. 2. L4-5 left paracentral extrusion with superior migration and impingement on the left L4 and L5 nerve roots. No other  neural impingement, suspect this is the symptomatic finding. 3. Active facet arthritis on the left at L4-5.     Electronically Signed   By: Monte Fantasia M.D.   On: 07/19/2020  ----  03/26/18 EMG/NCS Dr. Leta Baptist - Axonal sensorimotor polyneuropathy. Autoimmune versus idiopathic.     Objective:  VS:  HT:     WT:    BMI:      BP:(!) 165/72   HR:70bpm   TEMP: ( )   RESP:  Physical Exam Vitals and nursing note reviewed.  Constitutional:      General: She is not in acute distress.    Appearance: Normal appearance. She is not ill-appearing.  HENT:     Head: Normocephalic and atraumatic.     Right Ear: External ear normal.     Left Ear: External ear normal.  Eyes:     Extraocular Movements: Extraocular movements intact.  Cardiovascular:     Rate and Rhythm: Normal rate.     Pulses: Normal pulses.  Pulmonary:     Effort: Pulmonary effort is normal. No respiratory distress.  Abdominal:     General: There is no distension.     Palpations: Abdomen is soft.  Musculoskeletal:        General: Tenderness present.     Cervical back: Neck supple.     Right lower leg: No edema.  Left lower leg: No edema.     Comments: Patient has good distal strength with no pain over the greater trochanters.  No clonus or focal weakness.  Skin:    Findings: No erythema, lesion or rash.  Neurological:     General: No focal deficit present.     Mental Status: She is alert and oriented to person, place, and time.     Sensory: No sensory deficit.     Motor: No weakness or abnormal muscle tone.     Coordination: Coordination normal.  Psychiatric:        Mood and Affect: Mood normal.        Behavior: Behavior normal.     Imaging: No results found.

## 2021-05-16 NOTE — Procedures (Signed)
Lumbosacral Transforaminal Epidural Steroid Injection - Sub-Pedicular Approach with Fluoroscopic Guidance  Patient: Felicia [name]      Date of Birth: [birth year] MRN: [id number] PCP: Kathyrn Lass, MD      Visit Date: 04/19/2021   Universal Protocol:    Date/Time: 04/19/2021  Consent Given By: the patient  Position: PRONE  Additional Comments: Vital signs were monitored before and after the procedure. Patient was prepped and draped in the usual sterile fashion. The correct patient, procedure, and site was verified.   Injection Procedure Details:   Procedure diagnoses: Lumbar radiculopathy [M54.16]    Meds Administered:  Meds ordered this encounter  Medications   methylPREDNISolone acetate (DEPO-MEDROL) injection 80 mg    Laterality: Left  Location/Site: L4  Needle:5.0 in., 22 ga.  Short bevel or Quincke spinal needle  Needle Placement: Transforaminal  Findings:    -Comments: Excellent flow of contrast along the nerve, nerve root and into the epidural space.  Procedure Details: After squaring off the end-plates to get a true AP view, the C-arm was positioned so that an oblique view of the foramen as noted above was visualized. The target area is just inferior to the "nose of the scotty dog" or sub pedicular. The soft tissues overlying this structure were infiltrated with 2-3 ml. of 1% Lidocaine without Epinephrine.  The spinal needle was inserted toward the target using a "trajectory" view along the fluoroscope beam.  Under AP and lateral visualization, the needle was advanced so it did not puncture dura and was located close the 6 O'Clock position of the pedical in AP tracterory. Biplanar projections were used to confirm position. Aspiration was confirmed to be negative for CSF and/or blood. A 1-2 ml. volume of Isovue-250 was injected and flow of contrast was noted at each level. Radiographs were obtained for documentation purposes.   After attaining the desired flow of  contrast documented above, a 0.5 to 1.0 ml test dose of 0.25% Marcaine was injected into each respective transforaminal space.  The patient was observed for 90 seconds post injection.  After no sensory deficits were reported, and normal lower extremity motor function was noted,   the above injectate was administered so that equal amounts of the injectate were placed at each foramen (level) into the transforaminal epidural space.   Additional Comments:  No complications occurred Dressing: 2 x 2 sterile gauze and Band-Aid    Post-procedure details: Patient was observed during the procedure. Post-procedure instructions were reviewed.  Patient left the clinic in stable condition.

## 2021-05-25 DIAGNOSIS — U071 COVID-19: Secondary | ICD-10-CM | POA: Diagnosis not present

## 2021-06-12 NOTE — Progress Notes (Signed)
Office Visit Note  Patient: Felicia [name]             Date of Birth: 1940-12-24           MRN: [id number]             PCP: Kathyrn Lass, MD Referring: Kathyrn Lass, MD Visit Date: 06/26/2021 Occupation: @GUAROCC @  Subjective:  Medication management.   History of Present Illness: Felicia [name] is a 81 y.o. female with a history of seropositive rheumatoid arthritis, osteoarthritis, degenerative disc disease and osteoporosis.  She states she is doing quite well on the combination of Rinvoq and methotrexate.  She believes this combination is working the best for her.  She developed COVID-19 infection in June and then again over the Christmas.  She had to stop her medications for about a week and then restarted.  She currently does not have any joint swelling but she continues to have a stiffness in multiple joints.  She also has some discomfort from osteoarthritis.  She states she was seen by neurologist recently for neuropathy and tremors.  She states tremors interfere with her daily activities.  Activities of Daily Living:  Patient reports morning stiffness for 24 hours.   Patient Denies nocturnal pain.  Difficulty dressing/grooming: Reports Difficulty climbing stairs: Reports Difficulty getting out of chair: Denies Difficulty using hands for taps, buttons, cutlery, and/or writing: Reports  Review of Systems  Constitutional:  Positive for appetite change and fatigue. Negative for night sweats, weight gain and weight loss.  HENT: Negative.  Negative for mouth sores, trouble swallowing, trouble swallowing, mouth dryness and nose dryness.   Eyes:  Positive for redness, visual disturbance and dryness. Negative for pain.       Has seen eye Doctor improving   Respiratory:  Positive for shortness of breath. Negative for cough and difficulty breathing.        Since Covid December / around Christmas 2022  Cardiovascular:  Negative for chest pain, palpitations, hypertension, irregular heartbeat  and swelling in legs/feet.       Heaviness  has appointments with [facility]   Gastrointestinal:  Positive for constipation. Negative for blood in stool and diarrhea.  Endocrine: Negative for increased urination.  Genitourinary:  Positive for nocturia. Negative for vaginal dryness.       Reports urinary frequency   Musculoskeletal:  Negative for joint pain, joint pain, joint swelling, myalgias, muscle weakness, morning stiffness, muscle tenderness and myalgias.  Skin:  Negative for color change, rash, hair loss, skin tightness, ulcers and sensitivity to sunlight.  Allergic/Immunologic: Positive for susceptible to infections.  Neurological:  Positive for tremors and memory loss. Negative for dizziness, night sweats and weakness.       Trouble concentrating after Covid   Hematological:  Positive for bruising/bleeding tendency. Negative for swollen glands.  Psychiatric/Behavioral:  Negative for depressed mood and sleep disturbance. The patient is nervous/anxious.    PMFS History:  Patient Active Problem List   Diagnosis Date Noted   Age-related osteoporosis without current pathological fracture 06/26/2021   Aplastic anemia ([place]) 06/26/2021   Hiatal hernia 06/26/2021   Prediabetes 06/26/2021   Neuropathy 06/16/2020   SIADH (syndrome of inappropriate ADH production) ([place]) 02/18/2020   Hyponatremia 11/18/2019   Thrombocytosis 08/01/2018   URI (upper respiratory infection) 04/14/2017   Primary osteoarthritis of both knees 03/06/2017   Exertional chest pain    Pain in the chest 03/27/2015   Postural hypotension 05/26/2013   OSA (obstructive sleep apnea) 03/22/2013  Lens replaced by other means 08/25/2012   Low tension open-angle glaucoma(365.12) 02/14/2012   Osteoarthritis 11/07/2011   Abdominal bruit 10/16/2011   HTN (hypertension) 08/13/2011   Anisocoria 08/13/2011   Cervical radiculopathy 07/17/2011   DDD (degenerative disc disease), cervical 07/10/2011   Glaucoma 06/06/2011    HIATAL HERNIA 01/30/2010   POSTMENOPAUSAL SYNDROME 12/14/2009   EUSTACHIAN TUBE DYSFUNCTION, RIGHT 06/28/2009   GERD 12/07/2008   Rheumatoid arthritis ([place]) 12/07/2008   COLONIC POLYPS, HX OF 12/07/2008   Hyperlipidemia LDL goal <70 10/17/2006   HYPERCALCEMIA 10/17/2006   Mitral valve disease 10/17/2006    Past Medical History:  Diagnosis Date   Adenomatous colon polyp 2004   Colo in 2009 "no polyps"   Complication of anesthesia    small airway per patient   Echocardiogram    Echo 8/22 EF 60-65, no RWMA, mild LVH, normal RVSF, moderately elevated PASP (RVSP 50.9), trivial MR, mild AI, AV sclerosis without stenosis   GERD (gastroesophageal reflux disease)    Glaucoma    Hiatal hernia    Hypertension    Neuropathy    Nuclear stress test    Myoview 8/22: EF 65, no ischemia; low risk   Rheumatoid arthritis(714.0)    [place]; Methotrexate (Dr. Patrecia Pour)   Sleep apnea    on CPAP    Family History  Problem Relation Age of Onset   Dementia Mother    Lung disease Mother        bronchiectasis   Heart disease Mother        Aortic Stenosis   Osteoporosis Mother    Heart attack Father 59       S/P CBAG   Stroke Maternal Grandmother 83   Ulcerative colitis Brother    Diabetes Neg Hx    Cancer Neg Hx    Colon cancer Neg Hx    Esophageal cancer Neg Hx    Stomach cancer Neg Hx    Pancreatic cancer Neg Hx    Liver disease Neg Hx    Past Surgical History:  Procedure Laterality Date   CARDIAC CATHETERIZATION N/A 03/29/2015   Procedure: Left Heart Cath and Coronary Angiography;  Surgeon: Belva Crome, MD;  Location: [facility];  Service: Cardiovascular;  Laterality: N/A;   CATARACT EXTRACTION, BILATERAL     COLONOSCOPY W/ POLYPECTOMY     X1; negative subsequently   DILATION AND CURETTAGE OF UTERUS     EYE SURGERY     due to RA   EYE SURGERY Left 06/2017   eyelid tendon    HYSTEROSCOPY WITH D & C  06/24/2012   Procedure: DILATATION AND CURETTAGE /HYSTEROSCOPY;   Surgeon: [name], MD;  Location: [facility];  Service: Gynecology;  Laterality: N/A;   SHOULDER SURGERY Left    due to RA   SQUAMOUS CELL CARCINOMA EXCISION     scalp and left leg   TRABECULECTOMY     X 2 OD; X 1 OS   UPPER GASTROINTESTINAL ENDOSCOPY     hiatal hernia   Social History   Social History Narrative   Single   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, 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   3 caffeine/day   No tobacco, EtOH, drugs   Immunization History  Administered Date(s) Administered   Influenza Split 03/08/2014   Influenza Whole 04/07/2007, 02/24/2008, 03/10/2009, 03/03/2012   Influenza, High Dose Seasonal PF 01/15/2016, 03/07/2017   Influenza,inj,Quad PF,6+ Mos 03/15/2013, 02/21/2015, 03/17/2018   PFIZER(Purple Top)SARS-COV-2 Vaccination 06/24/2019, 07/13/2019, 01/15/2020, 06/26/2020   PPD Test 03/10/2012   Pneumococcal Conjugate-13  09/12/2015   Pneumococcal Polysaccharide-23 03/25/2003, 04/12/2008   Td 12/14/2009   Zoster Recombinat (Shingrix) 03/23/2018     Objective: Vital Signs: BP (!) 145/72    Pulse 76    Ht 5\' 5"  (1.651 m)    Wt 135 lb (61.2 kg)    BMI 22.47 kg/m    Physical Exam Vitals and nursing note reviewed.  Constitutional:      Appearance: She is well-developed.  HENT:     Head: Normocephalic and atraumatic.  Eyes:     Conjunctiva/sclera: Conjunctivae normal.  Cardiovascular:     Rate and Rhythm: Normal rate and regular rhythm.     Heart sounds: Normal heart sounds.  Pulmonary:     Effort: Pulmonary effort is normal.     Breath sounds: Normal breath sounds.  Abdominal:     General: Bowel sounds are normal.     Palpations: Abdomen is soft.  Musculoskeletal:     Cervical back: Normal range of motion.  Lymphadenopathy:     Cervical: No cervical adenopathy.  Skin:    General: Skin is warm and dry.     Capillary Refill: Capillary refill takes less than 2 seconds.  Neurological:     Mental Status: She is alert and oriented to person, place, and time.  Psychiatric:        Behavior:  Behavior normal.     Musculoskeletal Exam: C-spine was in limited range of motion.  Shoulder joints, elbow joints, wrist joints, MCPs PIPs and DIPs with good range of motion with no synovitis.  Hip joints, knee joints with good range of motion.  She had no tenderness over ankles or MTPs.  CDAI Exam: CDAI Score: 0.5  Patient Global: 3 mm; Provider Global: 2 mm Swollen: 0 ; Tender: 0  Joint Exam 06/26/2021   No joint exam has been documented for this visit   There is currently no information documented on the homunculus. Go to the Rheumatology activity and complete the homunculus joint exam.  Investigation: No additional findings.  Imaging: No results found.  Recent Labs: Lab Results  Component Value Date   WBC 9.5 04/20/2021   HGB 11.6 (L) 04/20/2021   PLT 410 (H) 04/20/2021   NA 135 04/20/2021   K 4.5 04/20/2021   CL 103 04/20/2021   CO2 23 04/20/2021   GLUCOSE 94 04/20/2021   BUN 20 04/20/2021   CREATININE 0.88 04/20/2021   BILITOT 0.3 04/20/2021   ALKPHOS 55 04/12/2021   AST 22 04/20/2021   ALT 18 04/20/2021   PROT 6.7 04/20/2021   ALBUMIN 4.2 04/12/2021   CALCIUM 10.0 04/20/2021   GFRAA 76 10/02/2020   QFTBGOLDPLUS INDETERMINATE (A) 01/22/2021    Speciality Comments: PPD 01/30/2021 Neg.  Reclast first infusion was given August 10, 2019, September 18, 2020 Rinvoq started September 06, 2020  Procedures:  No procedures performed Allergies: Sulfonamide derivatives, Amlodipine, and Norvasc [amlodipine besylate]   Assessment / Plan:     Visit Diagnoses: Rheumatoid arthritis involving multiple sites with positive rheumatoid factor (HCC) - +RF, +ANA: No synovitis on my examination.  She has been tolerating Rinvoq and methotrexate without any problems.  She continues to have some joint stiffness most likely due to underlying osteoarthritis.  High risk medication use - Rinvoq 15 mg 1 tablet by mouth daily, Methotrexate 6 tablets by mouth every 7 days, and folic acid 1 mg 2  tablets daily.  She had to come off Rinvoq and methotrexate for 1 week in December due to COVID-19 infection.  Her labs from April 20, 2021 were stable.  PPD was negative on January 30, 2021.  She will get labs again in February and then every 3 months to monitor for drug toxicity.  She was advised to hold methotrexate and Rinvoq if she develops an infection.  Information on immunization was also placed in the AVS.  MACE black box warning with Rinvoq was again reviewed.  Patient voiced understanding.  Primary osteoarthritis of both hands-she continues to have some stiffness.  Primary osteoarthritis of both knees-she has stiffness in her knee joints especially in the colder temperatures.  DDD (degenerative disc disease), cervical-she had limited range of motion without discomfort.  DDD (degenerative disc disease), lumbar - She has severe scoliosis multilevel spondylosis and L4-L5 spondylolisthesis.   Age-related osteoporosis without current pathological fracture - DEXA 07/26/2019 right 1/3 distal radius BMD 0.54 with T score -2.5.  She had a Reclast IV infusion in March 2022. - Plan: VITAMIN D 25 Hydroxy (Vit-D Deficiency, Fractures) with her next labs in February.  Will be getting repeat DEXA through Dr. Sabra Heck.  Plan to give her IV Reclast in April 2023.  After the next Reclast infusion she should go on a drug holiday.  History of vitamin D deficiency -vitamin D was normal on February 2021.  Plan: VITAMIN D 25 Hydroxy (Vit-D Deficiency, Fractures)  Other medical problems are listed as follows:  History of squamous cell carcinoma - Followed by dermatology. Scalp and left lower leg.  No history of melanoma.  Essential hypertension - Followed by cardiology.   History of hyperlipidemia  Tremor-followed by neurology.  She states the tremors interfere with her daily activities.  Neuropathy - she is followed by Dr. Leta Baptist.  SIADH (syndrome of inappropriate ADH production) ([place]) - followed by  endocrinology.  History of glaucoma  History of sleep apnea  History of gastroesophageal reflux (GERD)  History of colonic polyps  Orders: Orders Placed This Encounter  Procedures   VITAMIN D 25 Hydroxy (Vit-D Deficiency, Fractures)   No orders of the defined types were placed in this encounter.   Follow-Up Instructions: Return in about 5 months (around 11/24/2021) for Rheumatoid arthritis, Osteoarthritis.   Bo Merino, MD  Note - This record has been created using Editor, commissioning.  Chart creation errors have been sought, but may not always  have been located. Such creation errors do not reflect on  the standard of medical care.

## 2021-06-13 ENCOUNTER — Telehealth: Payer: Self-pay | Admitting: Cardiology

## 2021-06-13 NOTE — Telephone Encounter (Signed)
Spoke with the patient and advised her that it does not matter whether she sees ENT first or gets CPAP first. She is seeing ENT to rule out any surgical causes for OSA. Patient verbalized understanding.  She would like to know the status of her CPAP machine and when she should be getting it. Advised that I would send a note to Gae Bon to check on.

## 2021-06-13 NOTE — Telephone Encounter (Signed)
I called Felicia [name] to inform her of her appt with [facility] on 07/05/21 @ 9:45am with Dr West Carbo and said she has not heard anymore information about getting a CPAP. She said is unsure of if she is supposed to see [facility] first or get the CPAP first. Can someone please call and advise her on what the plan is?

## 2021-06-15 DIAGNOSIS — H04123 Dry eye syndrome of bilateral lacrimal glands: Secondary | ICD-10-CM | POA: Diagnosis not present

## 2021-06-15 DIAGNOSIS — H401233 Low-tension glaucoma, bilateral, severe stage: Secondary | ICD-10-CM | POA: Diagnosis not present

## 2021-06-15 DIAGNOSIS — H409 Unspecified glaucoma: Secondary | ICD-10-CM | POA: Diagnosis not present
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
## 2021-06-20 NOTE — Patient Instructions (Signed)
Below is our plan:  We will continue gabapentin 100mg  twice daily.   Please make sure you are staying well hydrated. I recommend 50-60 ounces daily. Well balanced diet and regular exercise encouraged. Consistent sleep schedule with 6-8 hours recommended.   Please continue follow up with care team as directed.   Follow up with me in 1 year   You may receive a survey regarding today's visit. I encourage you to leave honest feed back as I do use this information to improve patient care. Thank you for seeing me today!

## 2021-06-21 ENCOUNTER — Encounter: Payer: Self-pay | Admitting: Family Medicine

## 2021-06-21 ENCOUNTER — Ambulatory Visit: Payer: Medicare PPO | Admitting: Family Medicine

## 2021-06-21 VITALS — BP 137/70 | HR 70 | Ht 65.0 in | Wt 135.0 lb

## 2021-06-21 DIAGNOSIS — G629 Polyneuropathy, unspecified: Secondary | ICD-10-CM

## 2021-06-21 MED ORDER — GABAPENTIN 100 MG PO CAPS: 100.0000 mg | ORAL_CAPSULE | Freq: Two times a day (BID) | ORAL | 3 refills | Status: DC

## 2021-06-24 ENCOUNTER — Other Ambulatory Visit: Payer: Self-pay | Admitting: Rheumatology

## 2021-06-24 DIAGNOSIS — M0579 Rheumatoid arthritis with rheumatoid factor of multiple sites without organ or systems involvement: Secondary | ICD-10-CM

## 2021-06-25 NOTE — Telephone Encounter (Addendum)
Next Visit: 06/26/2021   Last Visit: 01/22/2021   Last Fill: 03/05/2021  RE:QJEADGNPHQ arthritis involving multiple sites with positive rheumatoid factor    Current Dose per office note 01/22/2021:Rinvoq 15 mg 1 tablet by mouth daily  Labs: 04/20/2021 CMP WNL.  Anemia has improved.  Plt count remains borderline elevated but stable.   TB Gold:  01/22/2021 Patient advised repeat TB Gold is indeterminate as well.  Patient will need PPD placement by her PCP.  Patient will bring a copy of the results to her appointment tomorrow.   Okay to refill Rinvoq?

## 2021-06-26 ENCOUNTER — Telehealth: Payer: Self-pay | Admitting: Pharmacist

## 2021-06-26 ENCOUNTER — Ambulatory Visit: Payer: Medicare PPO | Admitting: Rheumatology

## 2021-06-26 ENCOUNTER — Other Ambulatory Visit: Payer: Self-pay

## 2021-06-26 ENCOUNTER — Encounter: Payer: Self-pay | Admitting: Rheumatology

## 2021-06-26 VITALS — BP 145/72 | HR 76 | Ht 65.0 in | Wt 135.0 lb

## 2021-06-26 DIAGNOSIS — Z8589 Personal history of malignant neoplasm of other organs and systems: Secondary | ICD-10-CM | POA: Diagnosis not present

## 2021-06-26 DIAGNOSIS — M5136 Other intervertebral disc degeneration, lumbar region: Secondary | ICD-10-CM | POA: Diagnosis not present

## 2021-06-26 DIAGNOSIS — M810 Age-related osteoporosis without current pathological fracture: Secondary | ICD-10-CM

## 2021-06-26 DIAGNOSIS — M5030 Other cervical disc degeneration, unspecified cervical region: Secondary | ICD-10-CM

## 2021-06-26 DIAGNOSIS — Z79899 Other long term (current) drug therapy: Secondary | ICD-10-CM

## 2021-06-26 DIAGNOSIS — Z111 Encounter for screening for respiratory tuberculosis: Secondary | ICD-10-CM

## 2021-06-26 DIAGNOSIS — R7303 Prediabetes: Secondary | ICD-10-CM | POA: Insufficient documentation

## 2021-06-26 DIAGNOSIS — K449 Diaphragmatic hernia without obstruction or gangrene: Secondary | ICD-10-CM | POA: Insufficient documentation

## 2021-06-26 DIAGNOSIS — E871 Hypo-osmolality and hyponatremia: Secondary | ICD-10-CM

## 2021-06-26 DIAGNOSIS — Z8639 Personal history of other endocrine, nutritional and metabolic disease: Secondary | ICD-10-CM

## 2021-06-26 DIAGNOSIS — Z8719 Personal history of other diseases of the digestive system: Secondary | ICD-10-CM

## 2021-06-26 DIAGNOSIS — M51369 Other intervertebral disc degeneration, lumbar region without mention of lumbar back pain or lower extremity pain: Secondary | ICD-10-CM

## 2021-06-26 DIAGNOSIS — M170 Bilateral primary osteoarthritis of knee: Secondary | ICD-10-CM | POA: Diagnosis not present

## 2021-06-26 DIAGNOSIS — Z860100 Personal history of colon polyps, unspecified: Secondary | ICD-10-CM

## 2021-06-26 DIAGNOSIS — E222 Syndrome of inappropriate secretion of antidiuretic hormone: Secondary | ICD-10-CM

## 2021-06-26 DIAGNOSIS — M19041 Primary osteoarthritis, right hand: Secondary | ICD-10-CM

## 2021-06-26 DIAGNOSIS — M19042 Primary osteoarthritis, left hand: Secondary | ICD-10-CM

## 2021-06-26 DIAGNOSIS — R1900 Intra-abdominal and pelvic swelling, mass and lump, unspecified site: Secondary | ICD-10-CM

## 2021-06-26 DIAGNOSIS — D619 Aplastic anemia, unspecified: Secondary | ICD-10-CM | POA: Insufficient documentation

## 2021-06-26 DIAGNOSIS — G629 Polyneuropathy, unspecified: Secondary | ICD-10-CM

## 2021-06-26 DIAGNOSIS — R251 Tremor, unspecified: Secondary | ICD-10-CM

## 2021-06-26 DIAGNOSIS — M0579 Rheumatoid arthritis with rheumatoid factor of multiple sites without organ or systems involvement: Secondary | ICD-10-CM | POA: Diagnosis not present

## 2021-06-26 DIAGNOSIS — Z8669 Personal history of other diseases of the nervous system and sense organs: Secondary | ICD-10-CM

## 2021-06-26 DIAGNOSIS — I10 Essential (primary) hypertension: Secondary | ICD-10-CM

## 2021-06-26 NOTE — Telephone Encounter (Signed)
Patient had OV Today with Dr. Estanislado Pandy. She is due for Rinvoq labs in February 2023. After discussion with Dr. Estanislado Pandy, will adjust labs to be drawn in March 2023 so that Reclast labs can be drawn within one month timeframe of when Reclast is due (09/14/21).  Patient will have updated DEXA in February 2023.  Knox Saliva, PharmD, MPH, BCPS Clinical Pharmacist (Rheumatology and Pulmonology)

## 2021-06-26 NOTE — Patient Instructions (Addendum)
Standing Labs We placed an order today for your standing lab work.   Please have your standing labs drawn in February and every 3 months  If possible, please have your labs drawn 2 weeks prior to your appointment so that the provider can discuss your results at your appointment.  Please note that you may see your imaging and lab results in [place] before we have reviewed them. We may be awaiting multiple results to interpret others before contacting you. Please allow our office up to 72 hours to thoroughly review all of the results before contacting the office for clarification of your results.  We have open lab daily: Monday through Thursday from 1:30-4:30 PM and Friday from 1:30-4:00 PM at the office of Dr. Bo Merino, [facility].   Please be advised, all patients with office appointments requiring lab work will take precedent over walk-in lab work.  If possible, please come for your lab work on Monday and Friday afternoons, as you may experience shorter wait times. The office is located at [address], [place], [place], [place] [address] No appointment is necessary.   Labs are drawn by [facility]. Please bring your co-pay at the time of your lab draw.  You may receive a bill from [facility] for your lab work.  Please note if you are on Hydroxychloroquine and and an order has been placed for a Hydroxychloroquine level, you will need to have it drawn 4 hours or more after your last dose.  If you wish to have your labs drawn at another location, please call the office 24 hours in advance to send orders.  If you have any questions regarding directions or hours of operation,  please call [phone].   As a reminder, please drink plenty of water prior to coming for your lab work. Thanks!  Vaccines You are taking a medication(s) that can suppress your immune system.  The following immunizations are recommended: Flu annually Covid-19  Td/Tdap (tetanus, diphtheria,  pertussis) every 10 years Pneumonia (Prevnar 15 then Pneumovax 23 at least 1 year apart.  Alternatively, can take Prevnar 20 without needing additional dose) Shingrix: 2 doses from 4 weeks to 6 months apart  Please check with your PCP to make sure you are up to date.   If you have signs or symptoms of an infection or start antibiotics: First, call your PCP for workup of your infection. Hold your medication through the infection, until you complete your antibiotics, and until symptoms resolve if you take the following: Injectable medication (Actemra, Benlysta, Cimzia, Cosentyx, Enbrel, Humira, Kevzara, Orencia, Remicade, Simponi, Stelara, Taltz, Tremfya) Methotrexate Leflunomide (Arava) Mycophenolate (Cellcept) Morrie Sheldon, Olumiant, or Rinvoq

## 2021-06-28 ENCOUNTER — Ambulatory Visit: Payer: Medicare PPO | Admitting: Family Medicine

## 2021-06-28 DIAGNOSIS — D259 Leiomyoma of uterus, unspecified: Secondary | ICD-10-CM | POA: Diagnosis not present

## 2021-07-05 DIAGNOSIS — G4733 Obstructive sleep apnea (adult) (pediatric): Secondary | ICD-10-CM | POA: Diagnosis not present

## 2021-07-05 NOTE — Telephone Encounter (Signed)
Contacted patiet through her mychart message to call [facility] for her set up status.

## 2021-07-26 DIAGNOSIS — H401233 Low-tension glaucoma, bilateral, severe stage: Secondary | ICD-10-CM | POA: Diagnosis not present

## 2021-07-26 DIAGNOSIS — H04123 Dry eye syndrome of bilateral lacrimal glands: Secondary | ICD-10-CM | POA: Diagnosis not present

## 2021-07-26 DIAGNOSIS — H409 Unspecified glaucoma: Secondary | ICD-10-CM | POA: Diagnosis not present

## 2021-07-30 ENCOUNTER — Telehealth: Payer: Self-pay | Admitting: Interventional Cardiology

## 2021-07-30 DIAGNOSIS — R5383 Other fatigue: Secondary | ICD-10-CM

## 2021-07-30 NOTE — Telephone Encounter (Signed)
Patient is scheduled for a lipid panel on tomorrow, 2/28. She would like to know if we received orders from Dr. Arlean Hopping office as well. If so, she would like to have these panels scheduled for tomorrow as well. Please advise.

## 2021-07-30 NOTE — Telephone Encounter (Signed)
We have not received any requests for add-on labs. I do see a pulm order for Vit D level. This can be checked at [facility] but will need to be re-entered under one of our providers for lab to be able to draw. Spoke with pt, looks like Vit D was anticipated to be drawn on 3/1 and lipids are scheduled with [place] on 2/28. I have re-entered a Vit D level and will forward results to Dr Arlean Hopping office.

## 2021-07-31 ENCOUNTER — Other Ambulatory Visit: Payer: Self-pay

## 2021-07-31 ENCOUNTER — Other Ambulatory Visit: Payer: Self-pay | Admitting: *Deleted

## 2021-07-31 ENCOUNTER — Other Ambulatory Visit: Payer: Medicare PPO | Admitting: *Deleted

## 2021-07-31 DIAGNOSIS — R5383 Other fatigue: Secondary | ICD-10-CM

## 2021-07-31 DIAGNOSIS — E785 Hyperlipidemia, unspecified: Secondary | ICD-10-CM

## 2021-07-31 DIAGNOSIS — Z79899 Other long term (current) drug therapy: Secondary | ICD-10-CM

## 2021-07-31 DIAGNOSIS — Z8639 Personal history of other endocrine, nutritional and metabolic disease: Secondary | ICD-10-CM

## 2021-07-31 DIAGNOSIS — M810 Age-related osteoporosis without current pathological fracture: Secondary | ICD-10-CM

## 2021-07-31 LAB — CBC WITH DIFFERENTIAL/PLATELET
Absolute Monocytes: 728 cells/uL (ref 200–950)
Basophils Absolute: 62 cells/uL (ref 0–200)
Basophils Relative: 0.6 %
Eosinophils Absolute: 62 cells/uL (ref 15–500)
Eosinophils Relative: 0.6 %
HCT: 30.6 % — ABNORMAL LOW (ref 35.0–45.0)
Hemoglobin: 10.3 g/dL — ABNORMAL LOW (ref 11.7–15.5)
Lymphs Abs: 2454 cells/uL (ref 850–3900)
MCH: 32.8 pg (ref 27.0–33.0)
MCHC: 33.7 g/dL (ref 32.0–36.0)
MCV: 97.5 fL (ref 80.0–100.0)
MPV: 9.6 fL (ref 7.5–12.5)
Monocytes Relative: 7 %
Neutro Abs: 7093 cells/uL (ref 1500–7800)
Neutrophils Relative %: 68.2 %
Platelets: 499 10*3/uL — ABNORMAL HIGH (ref 140–400)
RBC: 3.14 10*6/uL — ABNORMAL LOW (ref 3.80–5.10)
RDW: 14 % (ref 11.0–15.0)
Total Lymphocyte: 23.6 %
WBC: 10.4 10*3/uL (ref 3.8–10.8)

## 2021-07-31 LAB — COMPLETE METABOLIC PANEL WITH GFR
AG Ratio: 1.8 (calc) (ref 1.0–2.5)
ALT: 27 U/L (ref 6–29)
AST: 36 U/L — ABNORMAL HIGH (ref 10–35)
Albumin: 4.1 g/dL (ref 3.6–5.1)
Alkaline phosphatase (APISO): 38 U/L (ref 37–153)
BUN: 25 mg/dL (ref 7–25)
CO2: 24 mmol/L (ref 20–32)
Calcium: 9.7 mg/dL (ref 8.6–10.4)
Chloride: 102 mmol/L (ref 98–110)
Creat: 0.97 mg/dL (ref 0.60–1.00)
Globulin: 2.3 g/dL (calc) (ref 1.9–3.7)
Glucose, Bld: 102 mg/dL — ABNORMAL HIGH (ref 65–99)
Potassium: 4.9 mmol/L (ref 3.5–5.3)
Sodium: 134 mmol/L — ABNORMAL LOW (ref 135–146)
Total Bilirubin: 0.5 mg/dL (ref 0.2–1.2)
Total Protein: 6.4 g/dL (ref 6.1–8.1)
eGFR: 59 mL/min/{1.73_m2} — ABNORMAL LOW (ref >=60)

## 2021-07-31 MED ORDER — METHOTREXATE 2.5 MG PO TABS: ORAL_TABLET | ORAL | 0 refills | Status: DC

## 2021-07-31 NOTE — Telephone Encounter (Signed)
Patient in office for labs. Patient requesting refill on MTX  Next Visit: 11/26/2021  Last Visit: 06/26/2021  Last Fill: 05/07/2021  DX: Rheumatoid arthritis involving multiple sites with positive rheumatoid factor   Current Dose per office note 06/26/2021: Methotrexate 6 tablets by mouth every 7 days  Labs: 06/20/2020 CMP WNL.  Anemia has improved.  Plt count remains borderline elevated but stable.  (Patient updated labs today)  Okay to refill MTX?

## 2021-08-01 ENCOUNTER — Other Ambulatory Visit: Payer: Self-pay | Admitting: *Deleted

## 2021-08-01 DIAGNOSIS — G4730 Sleep apnea, unspecified: Secondary | ICD-10-CM | POA: Diagnosis not present

## 2021-08-01 DIAGNOSIS — R0683 Snoring: Secondary | ICD-10-CM | POA: Diagnosis not present

## 2021-08-01 LAB — VITAMIN D 25 HYDROXY (VIT D DEFICIENCY, FRACTURES): Vit D, 25-Hydroxy: 98.1 ng/mL (ref 30.0–100.0)

## 2021-08-01 LAB — HEPATIC FUNCTION PANEL
ALT: 31 IU/L (ref 0–32)
AST: 41 IU/L — ABNORMAL HIGH (ref 0–40)
Albumin: 4.6 g/dL (ref 3.7–4.7)
Alkaline Phosphatase: 39 IU/L — ABNORMAL LOW (ref 44–121)
Bilirubin Total: 0.5 mg/dL (ref 0.0–1.2)
Bilirubin, Direct: 0.16 mg/dL (ref 0.00–0.40)
Total Protein: 6.5 g/dL (ref 6.0–8.5)

## 2021-08-01 LAB — LIPID PANEL
Chol/HDL Ratio: 2.6 ratio (ref 0.0–4.4)
Cholesterol, Total: 154 mg/dL (ref 100–199)
HDL: 60 mg/dL (ref >39)
LDL Chol Calc (NIH): 78 mg/dL (ref 0–99)
Triglycerides: 84 mg/dL (ref 0–149)
VLDL Cholesterol Cal: 16 mg/dL (ref 5–40)

## 2021-08-01 MED ORDER — METHOTREXATE 2.5 MG PO TABS: 10.0000 mg | ORAL_TABLET | ORAL | 0 refills | Status: DC

## 2021-08-01 NOTE — Telephone Encounter (Signed)
-----   Message from Bo Merino, MD sent at 08/01/2021  8:09 AM EST ----- ?GFR is low and stable.  Sodium is low.  Liver function is improved but is still mildly elevated.  Hemoglobin is low at 10.3.  He is advised her to take multivitamin with iron.  Reduce methotrexate to 4 tablets p.o. weekly. ?

## 2021-08-01 NOTE — Progress Notes (Signed)
GFR is low and stable.  Sodium is low.  Liver function is improved but is still mildly elevated.  Hemoglobin is low at 10.3.  He is advised her to take multivitamin with iron.  Reduce methotrexate to 4 tablets p.o. weekly.

## 2021-08-01 NOTE — Progress Notes (Signed)
Vitamin D is at the upper limits of normal.  Please check what dose of vitamin D patient is on.  She should not be taking more than 1000 units a day.  If she is on 1000 units a day she may cut back vitamin D  to every other day.

## 2021-08-07 DIAGNOSIS — Z1231 Encounter for screening mammogram for malignant neoplasm of breast: Secondary | ICD-10-CM | POA: Diagnosis not present

## 2021-08-20 ENCOUNTER — Telehealth: Payer: Self-pay | Admitting: Physical Medicine and Rehabilitation

## 2021-08-20 NOTE — Telephone Encounter (Signed)
Pt called and states that her right side is hurting her now instead of the left. Wondering can she sch an OV?  ? ?Cb [phone]  ?

## 2021-08-22 ENCOUNTER — Ambulatory Visit: Payer: Medicare PPO | Admitting: Family Medicine

## 2021-08-23 DIAGNOSIS — H401233 Low-tension glaucoma, bilateral, severe stage: Secondary | ICD-10-CM | POA: Diagnosis not present

## 2021-08-23 DIAGNOSIS — H5989 Other postprocedural complications and disorders of eye and adnexa, not elsewhere classified: Secondary | ICD-10-CM | POA: Diagnosis not present

## 2021-08-23 DIAGNOSIS — H01003 Unspecified blepharitis right eye, unspecified eyelid: Secondary | ICD-10-CM | POA: Diagnosis not present

## 2021-08-23 DIAGNOSIS — H04123 Dry eye syndrome of bilateral lacrimal glands: Secondary | ICD-10-CM | POA: Diagnosis not present

## 2021-08-23 DIAGNOSIS — H409 Unspecified glaucoma: Secondary | ICD-10-CM | POA: Diagnosis not present

## 2021-08-23 DIAGNOSIS — H01006 Unspecified blepharitis left eye, unspecified eyelid: Secondary | ICD-10-CM | POA: Diagnosis not present

## 2021-08-23 DIAGNOSIS — H02834 Dermatochalasis of left upper eyelid: Secondary | ICD-10-CM | POA: Diagnosis not present

## 2021-08-23 DIAGNOSIS — H02831 Dermatochalasis of right upper eyelid: Secondary | ICD-10-CM | POA: Diagnosis not present

## 2021-08-23 DIAGNOSIS — H16143 Punctate keratitis, bilateral: Secondary | ICD-10-CM | POA: Diagnosis not present

## 2021-08-24 ENCOUNTER — Other Ambulatory Visit: Payer: Self-pay

## 2021-08-24 ENCOUNTER — Encounter: Payer: Self-pay | Admitting: Physical Medicine and Rehabilitation

## 2021-08-24 ENCOUNTER — Ambulatory Visit: Payer: Medicare PPO | Admitting: Physical Medicine and Rehabilitation

## 2021-08-24 VITALS — BP 156/74 | HR 77

## 2021-08-24 DIAGNOSIS — M5116 Intervertebral disc disorders with radiculopathy, lumbar region: Secondary | ICD-10-CM

## 2021-08-24 DIAGNOSIS — M419 Scoliosis, unspecified: Secondary | ICD-10-CM | POA: Diagnosis not present

## 2021-08-24 DIAGNOSIS — M47816 Spondylosis without myelopathy or radiculopathy, lumbar region: Secondary | ICD-10-CM | POA: Diagnosis not present

## 2021-08-24 DIAGNOSIS — M5416 Radiculopathy, lumbar region: Secondary | ICD-10-CM

## 2021-08-24 NOTE — Progress Notes (Signed)
Pt state lower back pain that travels to her right hip and leg. Pt state walking and standing makes the pain worse. Pt state she takes over the counter pain meds and uses heat to help ease her pain. Pt has hx of inj on 04/19/21 pt state helped a little. ? ?Numeric Pain Rating Scale and Functional Assessment ?Average Pain 10 ?Pain Right Now 6 ?My pain is intermittent, sharp, dull, stabbing, and aching ?Pain is worse with: walking, standing, and some activites ?Pain improves with: heat/ice, medication, and injections ? ? ?In the last MONTH (on 0-10 scale) has pain interfered with the following? ? ?1. General activity like being  able to carry out your everyday physical activities such as walking, climbing stairs, carrying groceries, or moving a chair?  ?Rating(6) ? ?2. Relation with others like being able to carry out your usual social activities and roles such as  activities at home, at work and in your community. ?Rating(7) ? ?3. Enjoyment of life such that you have  been bothered by emotional problems such as feeling anxious, depressed or irritable?  ?Rating(8) ? ?

## 2021-08-24 NOTE — Progress Notes (Signed)
? ?Felicia [name] - 81 y.o. female MRN [id number]  Date of birth: 1940-09-12 ? ?Office Visit Note: ?Visit Date: 08/24/2021 ?PCP: Kathyrn Lass, MD ?Referred by: Kathyrn Lass, MD ? ?Subjective: ?Chief Complaint  ?Patient presents with  ? Lower Back - Pain  ? Right Hip - Pain  ? Right Leg - Pain  ? ?HPI: Felicia [name] is a 81 y.o. female who comes in today for evaluation of chronic, worsening and severe right sided lower back pain radiating to anterolateral thigh region. Patient reports chronic issues with left sided back pain, however her pain is now more right sided and has increased over the last several weeks. She describes her pain as constant sore and aching sensation, currently rates as 7 out of 10. Patient reports some relief of pain with home exercise regimen, rest and use of OTC medications as needed. 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 Patients lumbar MRI from 2022 exhibits multi-level facet hypertrophy, superiorly migrating left paracentral extrusion impinging on the descending L4 nerve root. Disc material and facet spurring also impinges on the descending left L5 nerve root. No high grade spinal canal stenosis. Patient has history of multiple left L4 transforaminal epidural steroid injections performed in our office, most recent on 04/19/2021 and reports significant relief of pain. Patient concerned that she exacerbated her pain at the gym, however she is not able to recall a certain event that caused severe pain.  ? ?Review of Systems  ?Musculoskeletal:  Positive for back pain.  ?Neurological:  Negative for tingling, sensory change, focal weakness and weakness.  ?All other systems reviewed and are negative. Otherwise per HPI. ? ?Assessment & Plan: ?Visit Diagnoses:  ?  ICD-10-CM   ?1. Lumbar radiculopathy  M54.16 Ambulatory referral to Physical  Medicine Rehab  ?  ?2. Intervertebral disc disorders with radiculopathy, lumbar region  M51.16 Ambulatory referral to Physical Medicine Rehab  ?  ?3. Facet hypertrophy of lumbar region  M47.816 Ambulatory referral to Physical Medicine Rehab  ?  ?4. Scoliosis of lumbar spine, unspecified scoliosis type  M41.9   ?  ?   ?Plan: Findings:  ?Chronic, worsening and severe right sided lower back pain radiating to anterolateral thigh region. Patient continues to have significant pain despite good conservative therapies such as home exercise regimen, rest and use of OTC medications as needed. Patients clinical presentation and exam are consistent with L4 nerve pattern. We believe the next step is to perform diagnostic and hopefully therapeutic right L4 transforaminal epidural steroid injection under fluoroscopic guidance. I would like to see patient for follow up in approximately 2 weeks for re-evaluation. If patients pain persists after lumbar epidural steroid injection we did discuss the possibility of obtaining new lumbar MRI imaging. Patient encouraged to remain active and continue with exercise regimen as tolerated. No red flag symptoms noted upon exam today.   ? ?Meds & Orders: No orders of the defined types were placed in this encounter. ?  ?Orders Placed This Encounter  ?Procedures  ? Ambulatory referral to Physical Medicine Rehab  ?  ?Follow-up: Return for Right L4 transforaminal epidural steroid injection.  ? ?Procedures: ?No procedures performed  ?   ? ?Clinical History: ?EXAM: ?MRI LUMBAR SPINE WITHOUT CONTRAST ?  ?TECHNIQUE: ?Multiplanar, multisequence MR imaging of the lumbar spine was ?performed. No intravenous contrast was administered. ?  ?COMPARISON:  Radiography 05/24/2019 ?  ?  FINDINGS: ?Segmentation:  5 lumbar type vertebrae based on prior radiography ?  ?Alignment: Dextroscoliosis. Grade 1 anterolisthesis at L3-4 and ?L4-5. ?  ?Vertebrae: Mild discogenic marrow signal at L2-3, L4-5, and L5-S1. ?Mild left  facet marrow edema at L4-5. ?  ?Conus medullaris and cauda equina: Conus extends to the L1-2 level. ?Conus and cauda equina appear normal. ?  ?Paraspinal and other soft tissues: Negative for perispinal mass or ?inflammation. There is a persistent hypointensity at the level of ?the minimally covered uterus suggesting fibroid. ?  ?Disc levels: ?  ?T12- L1: Unremarkable. ?  ?L1-L2: Unremarkable. ?  ?L2-L3: Disc collapse asymmetric to the left with endplate ridging ?and disc bulging. Mild left facet spurring. ?  ?L3-L4: Disc narrowing and bulging with mild facet spurring. ?  ?L4-L5: Facet osteoarthritis with spurring and mild anterolisthesis. ?Disc space narrowing and bulging with superiorly migrating left ?paracentral extrusion reaching the upper half of the L4 vertebral ?body and impinging on the descending L4 nerve root. Disc material ?and facet spurring also impinges on the descending left L5 nerve ?root. ?  ?L5-S1:Disc narrowing and mild bulging. Minor facet spurring. No ?neural compression. ?  ?IMPRESSION: ?1. Multilevel disc and facet degeneration with levoscoliosis and ?L3-4, L4-5 anterolisthesis. ?2. L4-5 left paracentral extrusion with superior migration and ?impingement on the left L4 and L5 nerve roots. No other neural ?impingement, suspect this is the symptomatic finding. ?3. Active facet arthritis on the left at L4-5. ?  ?  ?Electronically Signed ?  By: Monte Fantasia M.D. ?  On: 07/19/2020 07:49  ? ?She reports that she quit smoking about 50 years ago. Her smoking use included cigarettes. She has a 2.50 pack-year smoking history. She has never used smokeless tobacco. No results for input(s): HGBA1C, LABURIC in the last 8760 hours. ? ?Objective:  VS:  HT:    WT:   BMI:     BP:(!) 156/74  HR:77bpm  TEMP: ( )  RESP:  ?Physical Exam ?Vitals and nursing note reviewed.  ?HENT:  ?   Head: Normocephalic and atraumatic.  ?   Right Ear: External ear normal.  ?   Left Ear: External ear normal.  ?   Nose: Nose  normal.  ?   Mouth/Throat:  ?   Mouth: Mucous membranes are moist.  ?Eyes:  ?   Extraocular Movements: Extraocular movements intact.  ?Cardiovascular:  ?   Rate and Rhythm: Normal rate.  ?   Pulses: Normal pulses.  ?Pulmonary:  ?   Effort: Pulmonary effort is normal.  ?Abdominal:  ?   General: Abdomen is flat. There is no distension.  ?Musculoskeletal:     ?   General: Tenderness present.  ?   Cervical back: Normal range of motion.  ?   Comments: Pt is slow to rise from seated position to standing. Good lumbar range of motion. Strong distal strength without clonus, no pain upon palpation of greater trochanters. Dysesthesias noted to right L4 dermatome. Sensation intact bilaterally. Walks independently, gait steady.   ?Skin: ?   General: Skin is warm and dry.  ?   Capillary Refill: Capillary refill takes less than 2 seconds.  ?Neurological:  ?   General: No focal deficit present.  ?   Mental Status: She is alert and oriented to person, place, and time.  ?Psychiatric:     ?   Mood and Affect: Mood normal.     ?   Behavior: Behavior normal.  ?  ?Ortho Exam ? ?Imaging: ?No results found. ? ?  Past Medical/Family/Surgical/Social History: ?Medications & Allergies reviewed per EMR, new medications updated. ?Patient Active Problem List  ? Diagnosis Date Noted  ? Age-related osteoporosis without current pathological fracture 06/26/2021  ? Aplastic anemia ([place]) 06/26/2021  ? Hiatal hernia 06/26/2021  ? Prediabetes 06/26/2021  ? Neuropathy 06/16/2020  ? SIADH (syndrome of inappropriate ADH production) ([place]) 02/18/2020  ? Hyponatremia 11/18/2019  ? Thrombocytosis 08/01/2018  ? URI (upper respiratory infection) 04/14/2017  ? Primary osteoarthritis of both knees 03/06/2017  ? Exertional chest pain   ? Pain in the chest 03/27/2015  ? Postural hypotension 05/26/2013  ? OSA (obstructive sleep apnea) 03/22/2013  ? Lens replaced by other means 08/25/2012  ? Low tension open-angle glaucoma(365.12) 02/14/2012  ? Osteoarthritis 11/07/2011   ? Abdominal bruit 10/16/2011  ? HTN (hypertension) 08/13/2011  ? Anisocoria 08/13/2011  ? Cervical radiculopathy 07/17/2011  ? DDD (degenerative disc disease), cervical 07/10/2011  ? Glaucoma 06/06/2011  ? HIA

## 2021-09-12 ENCOUNTER — Other Ambulatory Visit: Payer: Self-pay | Admitting: Pharmacist

## 2021-09-12 DIAGNOSIS — M810 Age-related osteoporosis without current pathological fracture: Secondary | ICD-10-CM

## 2021-09-12 NOTE — Progress Notes (Signed)
Next infusion not yet scheduled for Reclast IV and due for updated orders. This will beher last Reclast - she will be on drug holiday. ?Diagnosis: age-related osteoporosis ? ?Dose: '5mg'$  IV every 12 motnhs ? ?Last Clinic Visit: 06/26/21 ?Next Clinic Visit: 11/26/21 ? ?Last infusion: 09/14/20 ? ?Labs: 07/31/21 - calcium, vitamin D, renal function normal to proceed with infusion ? ?Orders placed for Reclast IV x 1 dose along with premedication of acetaminophen and diphenhydramine to be administered 30 minutes before medication infusion. ? ?Called patient and provided with phone number for: ?[facility] [phone]) [place] ? ?Will follow-up to ensured scheduled and completed  ? ?Knox Saliva, PharmD, MPH, BCPS ?Clinical Pharmacist (Rheumatology and Pulmonology) ?

## 2021-09-19 ENCOUNTER — Encounter: Payer: Self-pay | Admitting: Physical Medicine and Rehabilitation

## 2021-09-19 ENCOUNTER — Ambulatory Visit: Payer: Self-pay

## 2021-09-19 ENCOUNTER — Ambulatory Visit: Payer: Medicare PPO | Admitting: Physical Medicine and Rehabilitation

## 2021-09-19 VITALS — BP 151/74 | HR 89

## 2021-09-19 DIAGNOSIS — M5416 Radiculopathy, lumbar region: Secondary | ICD-10-CM

## 2021-09-19 MED ORDER — METHYLPREDNISOLONE ACETATE 80 MG/ML IJ SUSP
80.0000 mg | Freq: Once | INTRAMUSCULAR | Status: AC
  Administered 2021-09-19: 80 mg

## 2021-09-19 NOTE — Progress Notes (Signed)
Pt state lower back pain that travels to her right hip and leg. Pt state walking and standing makes the pain worse. Pt state she takes over the counter pain meds and uses heat to help ease her pain. ? ?Numeric Pain Rating Scale and Functional Assessment ?Average Pain 5 ? ? ?In the last MONTH (on 0-10 scale) has pain interfered with the following? ? ?1. General activity like being  able to carry out your everyday physical activities such as walking, climbing stairs, carrying groceries, or moving a chair?  ?Rating(10) ? ? ?+Driver, -BT, -Dye Allergies. ? ?

## 2021-09-19 NOTE — Patient Instructions (Signed)

## 2021-09-21 NOTE — Progress Notes (Signed)
Patient will have labs drawn in May 2023 and would like to get Reclast after those labs ? ?Knox Saliva, PharmD, MPH, BCPS ?Clinical Pharmacist (Rheumatology and Pulmonology) ?

## 2021-09-27 DIAGNOSIS — D2372 Other benign neoplasm of skin of left lower limb, including hip: Secondary | ICD-10-CM | POA: Diagnosis not present

## 2021-09-27 DIAGNOSIS — B353 Tinea pedis: Secondary | ICD-10-CM | POA: Diagnosis not present

## 2021-09-27 DIAGNOSIS — L570 Actinic keratosis: Secondary | ICD-10-CM | POA: Diagnosis not present

## 2021-09-27 DIAGNOSIS — D0461 Carcinoma in situ of skin of right upper limb, including shoulder: Secondary | ICD-10-CM | POA: Diagnosis not present

## 2021-09-27 DIAGNOSIS — D485 Neoplasm of uncertain behavior of skin: Secondary | ICD-10-CM | POA: Diagnosis not present

## 2021-09-27 DIAGNOSIS — C44612 Basal cell carcinoma of skin of right upper limb, including shoulder: Secondary | ICD-10-CM | POA: Diagnosis not present

## 2021-09-27 DIAGNOSIS — D225 Melanocytic nevi of trunk: Secondary | ICD-10-CM | POA: Diagnosis not present

## 2021-09-27 DIAGNOSIS — L578 Other skin changes due to chronic exposure to nonionizing radiation: Secondary | ICD-10-CM | POA: Diagnosis not present

## 2021-09-27 DIAGNOSIS — Z85828 Personal history of other malignant neoplasm of skin: Secondary | ICD-10-CM | POA: Diagnosis not present

## 2021-09-27 DIAGNOSIS — B078 Other viral warts: Secondary | ICD-10-CM | POA: Diagnosis not present

## 2021-09-27 DIAGNOSIS — L821 Other seborrheic keratosis: Secondary | ICD-10-CM | POA: Diagnosis not present

## 2021-10-01 ENCOUNTER — Encounter: Payer: Self-pay | Admitting: Physical Medicine and Rehabilitation

## 2021-10-01 ENCOUNTER — Ambulatory Visit: Payer: Medicare PPO | Admitting: Physical Medicine and Rehabilitation

## 2021-10-01 VITALS — BP 155/83 | HR 66

## 2021-10-01 DIAGNOSIS — M419 Scoliosis, unspecified: Secondary | ICD-10-CM

## 2021-10-01 DIAGNOSIS — M5416 Radiculopathy, lumbar region: Secondary | ICD-10-CM | POA: Diagnosis not present

## 2021-10-01 DIAGNOSIS — M47816 Spondylosis without myelopathy or radiculopathy, lumbar region: Secondary | ICD-10-CM | POA: Diagnosis not present

## 2021-10-01 DIAGNOSIS — M5116 Intervertebral disc disorders with radiculopathy, lumbar region: Secondary | ICD-10-CM

## 2021-10-01 NOTE — Progress Notes (Signed)
Pt has hx of inj on 09/19/21 pt state it helped a little 80%. Pt state lower back that travels down her right leg and thigh. Pt state she takes over the counter pain meds to help ease her pain. ? ?Numeric Pain Rating Scale and Functional Assessment ?Average Pain 8 ?Pain Right Now 6 ?My pain is intermittent, dull, stabbing, and aching ?Pain is worse with: walking, sitting, and some activites ?Pain improves with: heat/ice, medication, and injections ? ? ?In the last MONTH (on 0-10 scale) has pain interfered with the following? ? ?1. General activity like being  able to carry out your everyday physical activities such as walking, climbing stairs, carrying groceries, or moving a chair?  ?Rating(5) ? ?2. Relation with others like being able to carry out your usual social activities and roles such as  activities at home, at work and in your community. ?Rating(6) ? ?3. Enjoyment of life such that you have  been bothered by emotional problems such as feeling anxious, depressed or irritable?  ?Rating(7) ? ?

## 2021-10-01 NOTE — Procedures (Signed)
Lumbosacral Transforaminal Epidural Steroid Injection - Sub-Pedicular Approach with Fluoroscopic Guidance ? ?Patient: Felicia [name]      ?Date of Birth: 1940-08-08 ?MRN: [id number] ?PCP: Kathyrn Lass, MD      ?Visit Date: 09/19/2021 ?  ?Universal Protocol:    ?Date/Time: 09/19/2021 ? ?Consent Given By: the patient ? ?Position: PRONE ? ?Additional Comments: ?Vital signs were monitored before and after the procedure. ?Patient was prepped and draped in the usual sterile fashion. ?The correct patient, procedure, and site was verified. ? ? ?Injection Procedure Details:  ? ?Procedure diagnoses: Lumbar radiculopathy [M54.16]   ? ?Meds Administered:  ?Meds ordered this encounter  ?Medications  ? methylPREDNISolone acetate (DEPO-MEDROL) injection 80 mg  ? ? ?Laterality: Right ? ?Location/Site: L4 ? ?Needle:5.0 in., 22 ga.  Short bevel or Quincke spinal needle ? ?Needle Placement: Transforaminal ? ?Findings: ?  ? -Comments: Excellent flow of contrast along the nerve, nerve root and into the epidural space. ? ?Procedure Details: ?After squaring off the end-plates to get a true AP view, the C-arm was positioned so that an oblique view of the foramen as noted above was visualized. The target area is just inferior to the "nose of the scotty dog" or sub pedicular. The soft tissues overlying this structure were infiltrated with 2-3 ml. of 1% Lidocaine without Epinephrine. ? ?The spinal needle was inserted toward the target using a "trajectory" view along the fluoroscope beam.  Under AP and lateral visualization, the needle was advanced so it did not puncture dura and was located close the 6 O'Clock position of the pedical in AP tracterory. Biplanar projections were used to confirm position. Aspiration was confirmed to be negative for CSF and/or blood. A 1-2 ml. volume of Isovue-250 was injected and flow of contrast was noted at each level. Radiographs were obtained for documentation purposes.  ? ?After attaining the desired flow of  contrast documented above, a 0.5 to 1.0 ml test dose of 0.25% Marcaine was injected into each respective transforaminal space.  The patient was observed for 90 seconds post injection.  After no sensory deficits were reported, and normal lower extremity motor function was noted,   the above injectate was administered so that equal amounts of the injectate were placed at each foramen (level) into the transforaminal epidural space. ? ? ?Additional Comments:  ?The patient tolerated the procedure well ?Dressing: 2 x 2 sterile gauze and Band-Aid ?  ? ?Post-procedure details: ?Patient was observed during the procedure. ?Post-procedure instructions were reviewed. ? ?Patient left the clinic in stable condition. ? ?

## 2021-10-01 NOTE — Progress Notes (Signed)
? ?Felicia [name] - 81 y.o. female MRN [id number]  Date of birth: 02/02/1941 ? ?Office Visit Note: ?Visit Date: 09/19/2021 ?PCP: Kathyrn Lass, MD ?Referred by: Kathyrn Lass, MD ? ?Subjective: ?Chief Complaint  ?Patient presents with  ? Lower Back - Pain  ? Right Hip - Pain  ? Right Leg - Pain  ? ?HPI:  Felicia [name] is a 81 y.o. female who comes in today at the request of Barnet Pall, FNP for planned Right L4-5 Lumbar Transforaminal epidural steroid injection with fluoroscopic guidance.  The patient has failed conservative care including home exercise, medications, time and activity modification.  This injection will be diagnostic and hopefully therapeutic.  Please see requesting physician notes for further details and justification. ? ? ? ?ROS Otherwise per HPI. ? ?Assessment & Plan: ?Visit Diagnoses:  ?  ICD-10-CM   ?1. Lumbar radiculopathy  M54.16 XR C-ARM NO REPORT  ?  Epidural Steroid injection  ?  methylPREDNISolone acetate (DEPO-MEDROL) injection 80 mg  ?  ?  ?Plan: No additional findings.  ? ?Meds & Orders:  ?Meds ordered this encounter  ?Medications  ? methylPREDNISolone acetate (DEPO-MEDROL) injection 80 mg  ?  ?Orders Placed This Encounter  ?Procedures  ? XR C-ARM NO REPORT  ? Epidural Steroid injection  ?  ?Follow-up: Return if symptoms worsen or fail to improve.  ? ?Procedures: ?No procedures performed  ?Lumbosacral Transforaminal Epidural Steroid Injection - Sub-Pedicular Approach with Fluoroscopic Guidance ? ?Patient: Felicia [name]      ?Date of Birth: [birth year] ?MRN: [id number] ?PCP: Kathyrn Lass, MD      ?Visit Date: 09/19/2021 ?  ?Universal Protocol:    ?Date/Time: 09/19/2021 ? ?Consent Given By: the patient ? ?Position: PRONE ? ?Additional Comments: ?Vital signs were monitored before and after the procedure. ?Patient was prepped and draped in the usual sterile fashion. ?The correct patient, procedure, and site was verified. ? ? ?Injection Procedure Details:  ? ?Procedure diagnoses: Lumbar  radiculopathy [M54.16]   ? ?Meds Administered:  ?Meds ordered this encounter  ?Medications  ? methylPREDNISolone acetate (DEPO-MEDROL) injection 80 mg  ? ? ?Laterality: Right ? ?Location/Site: L4 ? ?Needle:5.0 in., 22 ga.  Short bevel or Quincke spinal needle ? ?Needle Placement: Transforaminal ? ?Findings: ?  ? -Comments: Excellent flow of contrast along the nerve, nerve root and into the epidural space. ? ?Procedure Details: ?After squaring off the end-plates to get a true AP view, the C-arm was positioned so that an oblique view of the foramen as noted above was visualized. The target area is just inferior to the "nose of the scotty dog" or sub pedicular. The soft tissues overlying this structure were infiltrated with 2-3 ml. of 1% Lidocaine without Epinephrine. ? ?The spinal needle was inserted toward the target using a "trajectory" view along the fluoroscope beam.  Under AP and lateral visualization, the needle was advanced so it did not puncture dura and was located close the 6 O'Clock position of the pedical in AP tracterory. Biplanar projections were used to confirm position. Aspiration was confirmed to be negative for CSF and/or blood. A 1-2 ml. volume of Isovue-250 was injected and flow of contrast was noted at each level. Radiographs were obtained for documentation purposes.  ? ?After attaining the desired flow of contrast documented above, a 0.5 to 1.0 ml test dose of 0.25% Marcaine was injected into each respective transforaminal space.  The patient was observed for 90 seconds post injection.  After no sensory deficits were reported, and normal lower  extremity motor function was noted,   the above injectate was administered so that equal amounts of the injectate were placed at each foramen (level) into the transforaminal epidural space. ? ? ?Additional Comments:  ?The patient tolerated the procedure well ?Dressing: 2 x 2 sterile gauze and Band-Aid ?  ? ?Post-procedure details: ?Patient was observed  during the procedure. ?Post-procedure instructions were reviewed. ? ?Patient left the clinic in stable condition. ?  ? ?Clinical History: ?EXAM: ?MRI LUMBAR SPINE WITHOUT CONTRAST ?  ?TECHNIQUE: ?Multiplanar, multisequence MR imaging of the lumbar spine was ?performed. No intravenous contrast was administered. ?  ?COMPARISON:  Radiography 05/24/2019 ?  ?FINDINGS: ?Segmentation:  5 lumbar type vertebrae based on prior radiography ?  ?Alignment: Dextroscoliosis. Grade 1 anterolisthesis at L3-4 and ?L4-5. ?  ?Vertebrae: Mild discogenic marrow signal at L2-3, L4-5, and L5-S1. ?Mild left facet marrow edema at L4-5. ?  ?Conus medullaris and cauda equina: Conus extends to the L1-2 level. ?Conus and cauda equina appear normal. ?  ?Paraspinal and other soft tissues: Negative for perispinal mass or ?inflammation. There is a persistent hypointensity at the level of ?the minimally covered uterus suggesting fibroid. ?  ?Disc levels: ?  ?T12- L1: Unremarkable. ?  ?L1-L2: Unremarkable. ?  ?L2-L3: Disc collapse asymmetric to the left with endplate ridging ?and disc bulging. Mild left facet spurring. ?  ?L3-L4: Disc narrowing and bulging with mild facet spurring. ?  ?L4-L5: Facet osteoarthritis with spurring and mild anterolisthesis. ?Disc space narrowing and bulging with superiorly migrating left ?paracentral extrusion reaching the upper half of the L4 vertebral ?body and impinging on the descending L4 nerve root. Disc material ?and facet spurring also impinges on the descending left L5 nerve ?root. ?  ?L5-S1:Disc narrowing and mild bulging. Minor facet spurring. No ?neural compression. ?  ?IMPRESSION: ?1. Multilevel disc and facet degeneration with levoscoliosis and ?L3-4, L4-5 anterolisthesis. ?2. L4-5 left paracentral extrusion with superior migration and ?impingement on the left L4 and L5 nerve roots. No other neural ?impingement, suspect this is the symptomatic finding. ?3. Active facet arthritis on the left at L4-5. ?  ?   ?Electronically Signed ?  By: Monte Fantasia M.D. ?  On: 07/19/2020 07:49  ? ? ? ?Objective:  VS:  HT:    WT:   BMI:     BP:(!) 151/74  HR:89bpm  TEMP: ( )  RESP:  ?Physical Exam ?Vitals and nursing note reviewed.  ?Constitutional:   ?   General: She is not in acute distress. ?   Appearance: Normal appearance. She is not ill-appearing.  ?HENT:  ?   Head: Normocephalic and atraumatic.  ?   Right Ear: External ear normal.  ?   Left Ear: External ear normal.  ?Eyes:  ?   Extraocular Movements: Extraocular movements intact.  ?Cardiovascular:  ?   Rate and Rhythm: Normal rate.  ?   Pulses: Normal pulses.  ?Pulmonary:  ?   Effort: Pulmonary effort is normal. No respiratory distress.  ?Abdominal:  ?   General: There is no distension.  ?   Palpations: Abdomen is soft.  ?Musculoskeletal:     ?   General: Tenderness present.  ?   Cervical back: Neck supple.  ?   Right lower leg: No edema.  ?   Left lower leg: No edema.  ?   Comments: Patient has good distal strength with no pain over the greater trochanters.  No clonus or focal weakness.  ?Skin: ?   Findings: No erythema, lesion or  rash.  ?Neurological:  ?   General: No focal deficit present.  ?   Mental Status: She is alert and oriented to person, place, and time.  ?   Sensory: No sensory deficit.  ?   Motor: No weakness or abnormal muscle tone.  ?   Coordination: Coordination normal.  ?Psychiatric:     ?   Mood and Affect: Mood normal.     ?   Behavior: Behavior normal.  ?  ? ?Imaging: ?No results found. ?

## 2021-10-01 NOTE — Progress Notes (Signed)
Felicia [name] - 81 y.o. female MRN [id number]  Date of birth: 06-03-1941  Office Visit Note: Visit Date: 10/01/2021 PCP: Sigmund Hazel, MD Referred by: Sigmund Hazel, MD  Subjective: Chief Complaint  Patient presents with   Lower Back - Pain   Right Leg - Pain   Right Thigh - Pain   HPI: Felicia [name] is a 81 y.o. female who comes in today for evaluation of chronic right sided lower back pain radiating to anterolateral thigh region. Patient reports pain has been ongoing for several weeks and is exacerbated by movement, activity and walking.  She describes her pain as a constant sore, aching and cramping sensation, currently rates her pain as 2 out of 10.  Patient reports some relief of pain with home exercise regimen, use of ice/heat, rest and use of over-the-counter medications as needed.  Patient states she recently joined [employer] and is walking and performing resistance training multiple times a week. Patients lumbar MRI from 2022 exhibits multi-level facet hypertrophy, superiorly migrating left paracentral extrusion impinging on the descending L4 nerve root. Disc material and facet spurring also impinges on the descending left L5 nerve root. No high grade spinal canal stenosis.  Patient  right L4 transforaminal epidural steroid injection performed in our office on 09/19/2021 and reports greater than 80% relief of pain with this procedure.  Patient states occasional right anterolateral thigh cramping and right bilateral lower back pain, however her pain is now manageable at home.  Patient does report increased functionality and ability to perform daily tasks post injection.   Review of Systems  Musculoskeletal:  Positive for back pain.  Neurological:  Negative for tingling, sensory change, focal weakness and weakness.  All other systems reviewed and are negative. Otherwise per HPI.  Assessment & Plan: Visit Diagnoses:    ICD-10-CM   1. Lumbar radiculopathy  M54.16     2. Intervertebral  disc disorders with radiculopathy, lumbar region  M51.16     3. Facet hypertrophy of lumbar region  M47.816     4. Scoliosis of lumbar spine, unspecified scoliosis type  M41.9        Plan: Findings:  Chronic right sided lower back pain to anterolateral thigh region. Recent right L4 transforaminal epidural steroid injection provided greater than 80% pain relief. Patient continues to use conservative therapies as needed at home to manage pain. Patients clinical presentation and exam remain consistent with L4 nerve pattern. Patient encouraged to let us know if her pain increases or changes in nature, we can repeat epidural steroid injection infrequently as needed. We would consider obtaining new lumbar MRI imaging if injections are short lived or do not continue to provide good relief of pain. Patient encouraged to remain active and to continue with home exercise regimen. No red flag symptoms noted upon exam today.    Meds & Orders: No orders of the defined types were placed in this encounter.  No orders of the defined types were placed in this encounter.   Follow-up: Return if symptoms worsen or fail to improve.   Procedures: No procedures performed      Clinical History: EXAM: MRI LUMBAR SPINE WITHOUT CONTRAST   TECHNIQUE: Multiplanar, multisequence MR imaging of the lumbar spine was performed. No intravenous contrast was administered.   COMPARISON:  Radiography 05/24/2019   FINDINGS: Segmentation:  5 lumbar type vertebrae based on prior radiography   Alignment: Dextroscoliosis. Grade 1 anterolisthesis at L3-4 and L4-5.   Vertebrae: Mild discogenic marrow signal at L2-3,  L4-5, and L5-S1. Mild left facet marrow edema at L4-5.   Conus medullaris and cauda equina: Conus extends to the L1-2 level. Conus and cauda equina appear normal.   Paraspinal and other soft tissues: Negative for perispinal mass or inflammation. There is a persistent hypointensity at the level of the  minimally covered uterus suggesting fibroid.   Disc levels:   T12- L1: Unremarkable.   L1-L2: Unremarkable.   L2-L3: Disc collapse asymmetric to the left with endplate ridging and disc bulging. Mild left facet spurring.   L3-L4: Disc narrowing and bulging with mild facet spurring.   L4-L5: Facet osteoarthritis with spurring and mild anterolisthesis. Disc space narrowing and bulging with superiorly migrating left paracentral extrusion reaching the upper half of the L4 vertebral body and impinging on the descending L4 nerve root. Disc material and facet spurring also impinges on the descending left L5 nerve root.   L5-S1:Disc narrowing and mild bulging. Minor facet spurring. No neural compression.   IMPRESSION: 1. Multilevel disc and facet degeneration with levoscoliosis and L3-4, L4-5 anterolisthesis. 2. L4-5 left paracentral extrusion with superior migration and impingement on the left L4 and L5 nerve roots. No other neural impingement, suspect this is the symptomatic finding. 3. Active facet arthritis on the left at L4-5.     Electronically Signed   By: Marnee Spring M.D.   On: 07/19/2020 07:49   She reports that she quit smoking about 50 years ago. Her smoking use included cigarettes. She has a 2.50 pack-year smoking history. She has never used smokeless tobacco. No results for input(s): HGBA1C, LABURIC in the last 8760 hours.  Objective:  VS:  HT:    WT:   BMI:     BP:(!) 155/83  HR:66bpm  TEMP: ( )  RESP:  Physical Exam Vitals and nursing note reviewed.  HENT:     Head: Normocephalic and atraumatic.     Right Ear: External ear normal.     Left Ear: External ear normal.     Nose: Nose normal.     Mouth/Throat:     Mouth: Mucous membranes are moist.  Eyes:     Extraocular Movements: Extraocular movements intact.  Cardiovascular:     Rate and Rhythm: Normal rate.     Pulses: Normal pulses.  Pulmonary:     Effort: Pulmonary effort is normal.  Abdominal:      General: Abdomen is flat. There is no distension.  Musculoskeletal:        General: Tenderness present.     Cervical back: Normal range of motion.     Comments: Pt is slow to rise from seated position to standing. Good lumbar range of motion. Strong distal strength without clonus, no pain upon palpation of greater trochanters. Sensation intact bilaterally. Walks independently, gait steady.   Skin:    General: Skin is warm and dry.     Capillary Refill: Capillary refill takes less than 2 seconds.  Neurological:     General: No focal deficit present.     Mental Status: She is alert and oriented to person, place, and time.  Psychiatric:        Mood and Affect: Mood normal.        Behavior: Behavior normal.    Ortho Exam  Imaging: No results found.  Past Medical/Family/Surgical/Social History: Medications & Allergies reviewed per EMR, new medications updated. Patient Active Problem List   Diagnosis Date Noted   Age-related osteoporosis without current pathological fracture 06/26/2021   Aplastic anemia (HCC) 06/26/2021  Hiatal hernia 06/26/2021   Prediabetes 06/26/2021   Neuropathy 06/16/2020   SIADH (syndrome of inappropriate ADH production) (HCC) 02/18/2020   Hyponatremia 11/18/2019   Thrombocytosis 08/01/2018   URI (upper respiratory infection) 04/14/2017   Primary osteoarthritis of both knees 03/06/2017   Exertional chest pain    Pain in the chest 03/27/2015   Postural hypotension 05/26/2013   OSA (obstructive sleep apnea) 03/22/2013   Lens replaced by other means 08/25/2012   Low tension open-angle glaucoma(365.12) 02/14/2012   Osteoarthritis 11/07/2011   Abdominal bruit 10/16/2011   HTN (hypertension) 08/13/2011   Anisocoria 08/13/2011   Cervical radiculopathy 07/17/2011   DDD (degenerative disc disease), cervical 07/10/2011   Glaucoma 06/06/2011   HIATAL HERNIA 01/30/2010   POSTMENOPAUSAL SYNDROME 12/14/2009   EUSTACHIAN TUBE DYSFUNCTION, RIGHT 06/28/2009    GERD 12/07/2008   Rheumatoid arthritis (HCC) 12/07/2008   COLONIC POLYPS, HX OF 12/07/2008   Hyperlipidemia LDL goal <70 10/17/2006   HYPERCALCEMIA 10/17/2006   Mitral valve disease 10/17/2006   Past Medical History:  Diagnosis Date   Adenomatous colon polyp 2004   Colo in 2009 "no polyps"   Complication of anesthesia    small airway per patient   Echocardiogram    Echo 8/22 EF 60-65, no RWMA, mild LVH, normal RVSF, moderately elevated PASP (RVSP 50.9), trivial MR, mild AI, AV sclerosis without stenosis   GERD (gastroesophageal reflux disease)    Glaucoma    Hiatal hernia    Hypertension    Neuropathy    Nuclear stress test    Myoview 8/22: EF 65, no ischemia; low risk   Rheumatoid arthritis(714.0)    Orencia; Methotrexate (Dr. Titus Dubin)   Sleep apnea    on CPAP   Family History  Problem Relation Age of Onset   Dementia Mother    Lung disease Mother        bronchiectasis   Heart disease Mother        Aortic Stenosis   Osteoporosis Mother    Heart attack Father 34       S/P CBAG   Stroke Maternal Grandmother 25   Ulcerative colitis Brother    Diabetes Neg Hx    Cancer Neg Hx    Colon cancer Neg Hx    Esophageal cancer Neg Hx    Stomach cancer Neg Hx    Pancreatic cancer Neg Hx    Liver disease Neg Hx    Past Surgical History:  Procedure Laterality Date   CARDIAC CATHETERIZATION N/A 03/29/2015   Procedure: Left Heart Cath and Coronary Angiography;  Surgeon: Lyn Records, MD;  [facility] INVASIVE CV LAB;  Service: Cardiovascular;  Laterality: N/A;   CATARACT EXTRACTION, BILATERAL     COLONOSCOPY W/ POLYPECTOMY     X1; negative subsequently   DILATION AND CURETTAGE OF UTERUS     EYE SURGERY     due to RA   EYE SURGERY Left 06/2017   eyelid tendon    HYSTEROSCOPY WITH D & C  06/24/2012   Procedure: DILATATION AND CURETTAGE /HYSTEROSCOPY;  Surgeon: Loney Laurence, MD;  Location: [facility];  Service: Gynecology;  Laterality: N/A;   SHOULDER SURGERY Left     due to RA   SQUAMOUS CELL CARCINOMA EXCISION     scalp and left leg   TRABECULECTOMY     X 2 OD; X 1 OS   UPPER GASTROINTESTINAL ENDOSCOPY     hiatal hernia   Social History   Occupational History   Occupation: retired  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)   Tobacco Use   Smoking status: Former    Packs/day: 0.50    Years: 5.00    Pack years: 2.50    Types: Cigarettes    Quit date: 06/04/1971    Years since quitting: 50.3   Smokeless tobacco: Never   Tobacco comments:    smoked about 3-5 years , up to 1/2 ppd  Vaping Use   Vaping Use: Never used  Substance and Sexual Activity   Alcohol use: No   Drug use: No   Sexual activity: Yes    Birth control/protection: Post-menopausal

## 2021-10-04 DIAGNOSIS — D0461 Carcinoma in situ of skin of right upper limb, including shoulder: Secondary | ICD-10-CM | POA: Diagnosis not present

## 2021-10-07 ENCOUNTER — Other Ambulatory Visit: Payer: Self-pay | Admitting: Physician Assistant

## 2021-10-07 DIAGNOSIS — M0579 Rheumatoid arthritis with rheumatoid factor of multiple sites without organ or systems involvement: Secondary | ICD-10-CM

## 2021-10-08 DIAGNOSIS — H401233 Low-tension glaucoma, bilateral, severe stage: Secondary | ICD-10-CM | POA: Diagnosis not present

## 2021-10-08 DIAGNOSIS — H04123 Dry eye syndrome of bilateral lacrimal glands: Secondary | ICD-10-CM | POA: Diagnosis not present

## 2021-10-08 DIAGNOSIS — H5989 Other postprocedural complications and disorders of eye and adnexa, not elsewhere classified: Secondary | ICD-10-CM | POA: Diagnosis not present

## 2021-10-08 NOTE — Telephone Encounter (Signed)
Next Visit: 11/26/2021 ? ?Last Visit: 06/26/2021 ? ?Last Fill: 06/25/2021 ? ?DX: Rheumatoid arthritis involving multiple sites with positive rheumatoid factor  ? ?Current Dose per office note 06/26/2021: Rinvoq 15 mg 1 tablet by mouth daily ? ?Labs: 07/31/2021 GFR is low and stable.  Sodium is low.  Liver function is improved but is still mildly elevated.  Hemoglobin is low at 10.3.  ? ?PPD 01/30/2021 Neg.   ? ?Okay to refill Rinvoq?  ?

## 2021-10-11 DIAGNOSIS — L988 Other specified disorders of the skin and subcutaneous tissue: Secondary | ICD-10-CM | POA: Diagnosis not present

## 2021-10-11 DIAGNOSIS — C44519 Basal cell carcinoma of skin of other part of trunk: Secondary | ICD-10-CM | POA: Diagnosis not present

## 2021-10-18 ENCOUNTER — Encounter: Payer: Self-pay | Admitting: Rheumatology

## 2021-10-19 ENCOUNTER — Other Ambulatory Visit: Payer: Self-pay | Admitting: *Deleted

## 2021-10-19 DIAGNOSIS — Z79899 Other long term (current) drug therapy: Secondary | ICD-10-CM | POA: Diagnosis not present

## 2021-10-19 DIAGNOSIS — M810 Age-related osteoporosis without current pathological fracture: Secondary | ICD-10-CM | POA: Diagnosis not present

## 2021-10-19 NOTE — Addendum Note (Signed)
Addended by: Carole Binning on: 10/19/2021 09:28 AM   Modules accepted: Orders

## 2021-10-19 NOTE — Progress Notes (Signed)
Patient sent message stating she will be coming in for labwork today. Will need Vitamin D drawn in addition to CBC w diff and CMP w GFR.  Knox Saliva, PharmD, MPH, BCPS, CPP Clinical Pharmacist (Rheumatology and Pulmonology)

## 2021-10-20 LAB — COMPLETE METABOLIC PANEL WITH GFR
AG Ratio: 1.9 (calc) (ref 1.0–2.5)
ALT: 18 U/L (ref 6–29)
AST: 29 U/L (ref 10–35)
Albumin: 4.6 g/dL (ref 3.6–5.1)
Alkaline phosphatase (APISO): 34 U/L — ABNORMAL LOW (ref 37–153)
BUN: 20 mg/dL (ref 7–25)
CO2: 21 mmol/L (ref 20–32)
Calcium: 10.1 mg/dL (ref 8.6–10.4)
Chloride: 99 mmol/L (ref 98–110)
Creat: 0.94 mg/dL (ref 0.60–1.00)
Globulin: 2.4 g/dL (calc) (ref 1.9–3.7)
Glucose, Bld: 78 mg/dL (ref 65–99)
Potassium: 4.6 mmol/L (ref 3.5–5.3)
Sodium: 131 mmol/L — ABNORMAL LOW (ref 135–146)
Total Bilirubin: 0.5 mg/dL (ref 0.2–1.2)
Total Protein: 7 g/dL (ref 6.1–8.1)
eGFR: 62 mL/min/{1.73_m2} (ref >=60)

## 2021-10-20 LAB — CBC WITH DIFFERENTIAL/PLATELET
Absolute Monocytes: 687 cells/uL (ref 200–950)
Basophils Absolute: 40 cells/uL (ref 0–200)
Basophils Relative: 0.4 %
Eosinophils Absolute: 71 cells/uL (ref 15–500)
Eosinophils Relative: 0.7 %
HCT: 32.1 % — ABNORMAL LOW (ref 35.0–45.0)
Hemoglobin: 10.8 g/dL — ABNORMAL LOW (ref 11.7–15.5)
Lymphs Abs: 2778 cells/uL (ref 850–3900)
MCH: 33.5 pg — ABNORMAL HIGH (ref 27.0–33.0)
MCHC: 33.6 g/dL (ref 32.0–36.0)
MCV: 99.7 fL (ref 80.0–100.0)
MPV: 9.4 fL (ref 7.5–12.5)
Monocytes Relative: 6.8 %
Neutro Abs: 6525 cells/uL (ref 1500–7800)
Neutrophils Relative %: 64.6 %
Platelets: 486 10*3/uL — ABNORMAL HIGH (ref 140–400)
RBC: 3.22 10*6/uL — ABNORMAL LOW (ref 3.80–5.10)
RDW: 13.1 % (ref 11.0–15.0)
Total Lymphocyte: 27.5 %
WBC: 10.1 10*3/uL (ref 3.8–10.8)

## 2021-10-20 LAB — VITAMIN D 25 HYDROXY (VIT D DEFICIENCY, FRACTURES): Vit D, 25-Hydroxy: 68 ng/mL (ref 30–100)

## 2021-10-21 NOTE — Progress Notes (Signed)
Hemoglobin is low and stable.  Platelets are elevated most likely due to anemia.  Sodium is low and stable.  Vitamin D is normal at 68.  Please forward results to her PCP.

## 2021-10-22 ENCOUNTER — Other Ambulatory Visit: Payer: Self-pay | Admitting: Pharmacist

## 2021-10-22 ENCOUNTER — Encounter: Payer: Self-pay | Admitting: Pharmacist

## 2021-10-22 DIAGNOSIS — M810 Age-related osteoporosis without current pathological fracture: Secondary | ICD-10-CM

## 2021-10-22 NOTE — Progress Notes (Signed)
Reclast orders placed with [facility]. Sent MyChart message to patient with phone number to schedule  Knox Saliva, PharmD, MPH, BCPS, CPP Clinical Pharmacist (Rheumatology and Pulmonology)

## 2021-10-22 NOTE — Progress Notes (Signed)
Next infusion not yet scheduled for Reclast IV and due for updated orders. Diagnosis: age-related osteoporosis  Dose: '5mg'$  IV yearly  Last Clinic Visit: 06/26/21 Next Clinic Visit: 11/26/21  Last infusion: 09/14/20  Labs: Vitamin D, calcium wnl on 10/19/21  Orders placed for Reclast IV x 1 dose along with premedication of acetaminophen and diphenhydramine to be administered 30 minutes before medication infusion.  Sent MyChart to patient and provided with phone number for: [facility] [phone]) Lady [place]  Will follow-up to ensured scheduled and completed   Knox Saliva, PharmD, MPH, BCPS, CPP Clinical Pharmacist (Rheumatology and Pulmonology)

## 2021-10-23 ENCOUNTER — Ambulatory Visit (HOSPITAL_BASED_OUTPATIENT_CLINIC_OR_DEPARTMENT_OTHER): Payer: Medicare PPO | Admitting: Family Medicine

## 2021-10-23 ENCOUNTER — Encounter (HOSPITAL_BASED_OUTPATIENT_CLINIC_OR_DEPARTMENT_OTHER): Payer: Self-pay | Admitting: Family Medicine

## 2021-10-23 DIAGNOSIS — M25551 Pain in right hip: Secondary | ICD-10-CM | POA: Diagnosis not present

## 2021-10-23 NOTE — Assessment & Plan Note (Signed)
Patient has been having ongoing right hip pain for the past few weeks.  She is a member at [employer], started with walking, progressed to do some of the [employer] classes. Pain over right lower back down into side of hip and anterior thigh Pain worsened with prolonged walking, standing; when she lays on that side when sleeping. Better with sitting, elevation, heat/ice. Denies any numbness or tingling. Feels that she has some weakness in the leg. On exam, patient has tenderness to palpation of the right greater trochanter primarily, mild discomfort along right IT band.  No significant anterior hip tenderness.  She does have some weakness with hip flexion, abduction, some pain with resisted abduction.  Negative FABER and FADIR.  Pain with Trendelenburg on right side. Feel that symptoms are primarily related to right-sided greater trochanteric pain syndrome.  Discussed this with patient as well as treatment options.  She would like to proceed with steroid injection in the office today, see procedure note above.  Also recommend working with physical therapy regarding this and she is interested in this, referral placed to PT downstairs.  Also discussed home exercise program as per PT Feel the patient will benefit from working on abductors We will plan to follow-up in about 6 to 8 weeks to monitor progress.  If not improving as expected, consider proceeding with imaging If improving, can continue with conservative measures and monitoring.  Did discuss that if she gets good relief, could consider repeat injections in the future

## 2021-10-23 NOTE — Progress Notes (Signed)
    Procedures performed today:    Procedure: Injection of the right greater trochanteric bursa Verbal informed consent obtained.  Time-out conducted.  Noted no overlying erythema, induration, or other signs of local infection.  Skin prepped in a sterile fashion.  Local anesthesia: Topical Ethyl chloride.  With sterile technique and under real time ultrasound guidance: 1 cc Kenalog 40, 4 cc 1% lidocaine injected easily Completed without difficulty  Advised to call if fevers/chills, erythema, induration, drainage, or persistent bleeding. Impression: Technically successful injection.  Independent interpretation of notes and tests performed by another provider:   None.  Brief History, Exam, Impression, and Recommendations:    BP (!) 155/60   Pulse 62   Ht '5\' 5"'$  (1.651 m)   Wt 135 lb 9.6 oz (61.5 kg)   SpO2 100%   BMI 22.57 kg/m   Greater trochanteric pain syndrome of right lower extremity Patient has been having ongoing right hip pain for the past few weeks.  She is a member at [employer], started with walking, progressed to do some of the [employer] classes. Pain over right lower back down into side of hip and anterior thigh Pain worsened with prolonged walking, standing; when she lays on that side when sleeping. Better with sitting, elevation, heat/ice. Denies any numbness or tingling. Feels that she has some weakness in the leg. On exam, patient has tenderness to palpation of the right greater trochanter primarily, mild discomfort along right IT band.  No significant anterior hip tenderness.  She does have some weakness with hip flexion, abduction, some pain with resisted abduction.  Negative FABER and FADIR.  Pain with Trendelenburg on right side. Feel that symptoms are primarily related to right-sided greater trochanteric pain syndrome.  Discussed this with patient as well as treatment options.  She would like to proceed with steroid injection in the office today, see procedure  note above.  Also recommend working with physical therapy regarding this and she is interested in this, referral placed to PT downstairs.  Also discussed home exercise program as per PT Feel the patient will benefit from working on abductors We will plan to follow-up in about 6 to 8 weeks to monitor progress.  If not improving as expected, consider proceeding with imaging If improving, can continue with conservative measures and monitoring.  Did discuss that if she gets good relief, could consider repeat injections in the future   ___________________________________________ Felicia [place], MD, ABFM, CAQSM [facility] [place]

## 2021-10-24 NOTE — Progress Notes (Signed)
Reclast scheduled for 11/06/21. Will f/u to ensure completed.  Knox Saliva, PharmD, MPH, BCPS, CPP Clinical Pharmacist (Rheumatology and Pulmonology)

## 2021-11-06 ENCOUNTER — Ambulatory Visit (HOSPITAL_COMMUNITY)
Admission: RE | Admit: 2021-11-06 | Discharge: 2021-11-06 | Disposition: A | Discharge status: Home / Self Care | Payer: Medicare PPO | Source: Ambulatory Visit | Attending: Rheumatology | Admitting: Rheumatology

## 2021-11-06 DIAGNOSIS — M810 Age-related osteoporosis without current pathological fracture: Secondary | ICD-10-CM | POA: Diagnosis not present

## 2021-11-06 MED ORDER — ACETAMINOPHEN 325 MG PO TABS
ORAL_TABLET | ORAL | Status: AC
  Filled 2021-11-06: qty 2

## 2021-11-06 MED ORDER — ACETAMINOPHEN 325 MG PO TABS
650.0000 mg | ORAL_TABLET | Freq: Once | ORAL | Status: AC
  Administered 2021-11-06: 650 mg via ORAL

## 2021-11-06 MED ORDER — DIPHENHYDRAMINE HCL 25 MG PO CAPS
25.0000 mg | ORAL_CAPSULE | Freq: Once | ORAL | Status: AC
  Administered 2021-11-06: 25 mg via ORAL

## 2021-11-06 MED ORDER — DIPHENHYDRAMINE HCL 25 MG PO CAPS
ORAL_CAPSULE | ORAL | Status: AC
  Filled 2021-11-06: qty 1

## 2021-11-06 MED ORDER — ZOLEDRONIC ACID 5 MG/100ML IV SOLN: 5.0000 mg | Freq: Once | INTRAVENOUS | Status: AC

## 2021-11-06 MED ORDER — ZOLEDRONIC ACID 5 MG/100ML IV SOLN
INTRAVENOUS | Status: AC
  Administered 2021-11-06: 5 mg via INTRAVENOUS
  Filled 2021-11-06: qty 100

## 2021-11-12 DIAGNOSIS — H401233 Low-tension glaucoma, bilateral, severe stage: Secondary | ICD-10-CM | POA: Diagnosis not present

## 2021-11-12 DIAGNOSIS — Z83511 Family history of glaucoma: Secondary | ICD-10-CM | POA: Diagnosis not present

## 2021-11-12 DIAGNOSIS — H02831 Dermatochalasis of right upper eyelid: Secondary | ICD-10-CM | POA: Diagnosis not present

## 2021-11-12 DIAGNOSIS — H16143 Punctate keratitis, bilateral: Secondary | ICD-10-CM | POA: Diagnosis not present

## 2021-11-12 DIAGNOSIS — H02834 Dermatochalasis of left upper eyelid: Secondary | ICD-10-CM | POA: Diagnosis not present

## 2021-11-16 NOTE — Progress Notes (Unsigned)
Office Visit Note  Patient: Felicia [name]             Date of Birth: [birth year]           MRN: [id number]             PCP: Kathyrn Lass, MD Referring: Kathyrn Lass, MD Visit Date: 11/26/2021 Occupation: '@GUAROCC'$ @  Subjective:  No chief complaint on file.   History of Present Illness: Felicia [name] is a 81 y.o. female ***   Activities of Daily Living:  Patient reports morning stiffness for *** {minute/hour:19697}.   Patient {ACTIONS;DENIES/REPORTS:21021675::"Denies"} nocturnal pain.  Difficulty dressing/grooming: {ACTIONS;DENIES/REPORTS:21021675::"Denies"} Difficulty climbing stairs: {ACTIONS;DENIES/REPORTS:21021675::"Denies"} Difficulty getting out of chair: {ACTIONS;DENIES/REPORTS:21021675::"Denies"} Difficulty using hands for taps, buttons, cutlery, and/or writing: {ACTIONS;DENIES/REPORTS:21021675::"Denies"}  No Rheumatology ROS completed.   PMFS History:  Patient Active Problem List   Diagnosis Date Noted  . Greater trochanteric pain syndrome of right lower extremity 10/23/2021  . Age-related osteoporosis without current pathological fracture 06/26/2021  . Aplastic anemia ([place]) 06/26/2021  . Hiatal hernia 06/26/2021  . Prediabetes 06/26/2021  . Neuropathy 06/16/2020  . SIADH (syndrome of inappropriate ADH production) ([place]) 02/18/2020  . Hyponatremia 11/18/2019  . Thrombocytosis 08/01/2018  . URI (upper respiratory infection) 04/14/2017  . Primary osteoarthritis of both knees 03/06/2017  . Exertional chest pain   . Pain in the chest 03/27/2015  . Postural hypotension 05/26/2013  . OSA (obstructive sleep apnea) 03/22/2013  . Lens replaced by other means 08/25/2012  . Low tension open-angle glaucoma(365.12) 02/14/2012  . Osteoarthritis 11/07/2011  . Abdominal bruit 10/16/2011  . HTN (hypertension) 08/13/2011  . Anisocoria 08/13/2011  . Cervical radiculopathy 07/17/2011  . DDD (degenerative disc disease), cervical 07/10/2011  . Glaucoma 06/06/2011  . HIATAL HERNIA  01/30/2010  . POSTMENOPAUSAL SYNDROME 12/14/2009  . EUSTACHIAN TUBE DYSFUNCTION, RIGHT 06/28/2009  . GERD 12/07/2008  . Rheumatoid arthritis ([place]) 12/07/2008  . COLONIC POLYPS, HX OF 12/07/2008  . Hyperlipidemia LDL goal <70 10/17/2006  . HYPERCALCEMIA 10/17/2006  . Mitral valve disease 10/17/2006    Past Medical History:  Diagnosis Date  . Adenomatous colon polyp 2004   Colo in 2009 "no polyps"  . Complication of anesthesia    small airway per patient  . Echocardiogram    Echo 8/22 EF 60-65, no RWMA, mild LVH, normal RVSF, moderately elevated PASP (RVSP 50.9), trivial MR, mild AI, AV sclerosis without stenosis  . GERD (gastroesophageal reflux disease)   . Glaucoma   . Hiatal hernia   . Hypertension   . Neuropathy   . Nuclear stress test    Myoview 8/22: EF 65, no ischemia; low risk  . Rheumatoid arthritis(714.0)    Orencia; Methotrexate (Dr. Patrecia Pour)  . Sleep apnea    on CPAP    Family History  Problem Relation Age of Onset  . Dementia Mother   . Lung disease Mother        bronchiectasis  . Heart disease Mother        Aortic Stenosis  . Osteoporosis Mother   . Heart attack Father 70       S/P CBAG  . Stroke Maternal Grandmother 83  . Ulcerative colitis Brother   . Diabetes Neg Hx   . Cancer Neg Hx   . Colon cancer Neg Hx   . Esophageal cancer Neg Hx   . Stomach cancer Neg Hx   . Pancreatic cancer Neg Hx   . Liver disease Neg Hx    Past Surgical History:  Procedure Laterality Date  .  CARDIAC CATHETERIZATION N/A 03/29/2015   Procedure: Left Heart Cath and Coronary Angiography;  Surgeon: Belva Crome, MD;  Location: [facility];  Service: Cardiovascular;  Laterality: N/A;  . CATARACT EXTRACTION, BILATERAL    . COLONOSCOPY W/ POLYPECTOMY     X1; negative subsequently  . DILATION AND CURETTAGE OF UTERUS    . EYE SURGERY     due to RA  . EYE SURGERY Left 06/2017   eyelid tendon   . HYSTEROSCOPY WITH D & C  06/24/2012   Procedure: DILATATION AND  CURETTAGE /HYSTEROSCOPY;  Surgeon: [name], MD;  Location: [facility];  Service: Gynecology;  Laterality: N/A;  . SHOULDER SURGERY Left    due to RA  . SQUAMOUS CELL CARCINOMA EXCISION     scalp and left leg  . TRABECULECTOMY     X 2 OD; X 1 OS  . 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 Influenza Split 03/08/2014  . Influenza Whole 04/07/2007, 02/24/2008, 03/10/2009, 03/03/2012  . Influenza, High Dose Seasonal PF 01/15/2016, 03/07/2017  . Influenza,inj,Quad PF,6+ Mos 03/15/2013, 02/21/2015, 03/17/2018  . PFIZER(Purple Top)SARS-COV-2 Vaccination 06/24/2019, 07/13/2019, 01/15/2020, 06/26/2020  . PPD Test 03/10/2012  . Pneumococcal Conjugate-13 09/12/2015  . Pneumococcal Polysaccharide-23 03/25/2003, 04/12/2008  . Td 12/14/2009  . Zoster Recombinat (Shingrix) 03/23/2018     Objective: Vital Signs: There were no vitals taken for this visit.   Physical Exam   Musculoskeletal Exam: ***  CDAI Exam: CDAI Score: -- Patient Global: --; Provider Global: -- Swollen: --; Tender: -- Joint Exam 11/26/2021   No joint exam has been documented for this visit   There is currently no information documented on the homunculus. Go to the Rheumatology activity and complete the homunculus joint exam.  Investigation: No additional findings.  Imaging: No results found.  Recent Labs: Lab Results  Component Value Date   WBC 10.1 10/19/2021   HGB 10.8 (L) 10/19/2021   PLT 486 (H) 10/19/2021   NA 131 (L) 10/19/2021   K 4.6 10/19/2021   CL 99 10/19/2021   CO2 21 10/19/2021   GLUCOSE 78 10/19/2021   BUN 20 10/19/2021   CREATININE 0.94 10/19/2021   BILITOT 0.5 10/19/2021   ALKPHOS 39 (L) 07/31/2021   AST 29 10/19/2021   ALT 18 10/19/2021   PROT 7.0 10/19/2021   ALBUMIN 4.6 07/31/2021    CALCIUM 10.1 10/19/2021   GFRAA 76 10/02/2020   QFTBGOLDPLUS INDETERMINATE (A) 01/22/2021    Speciality Comments: PPD 01/30/2021 Neg.  Reclast first infusion was given August 10, 2019, September 18, 2020 Rinvoq started September 06, 2020  Procedures:  No procedures performed Allergies: Sulfonamide derivatives, Amlodipine, and Norvasc [amlodipine besylate]   Assessment / Plan:     Visit Diagnoses: Rheumatoid arthritis involving multiple sites with positive rheumatoid factor ([place])  High risk medication use  Primary osteoarthritis of both hands  Primary osteoarthritis of both knees  DDD (degenerative disc disease), cervical  DDD (degenerative disc disease), lumbar  Age-related osteoporosis without current pathological fracture  History of vitamin D deficiency  History of squamous cell carcinoma  Essential hypertension  History of hyperlipidemia  Neuropathy  SIADH (syndrome of inappropriate ADH production) ([facility])  History of glaucoma  History of sleep apnea  History of gastroesophageal reflux (GERD)  History  of colonic polyps  Coronary artery disease involving native coronary artery of native heart without angina pectoris  Orders: No orders of the defined types were placed in this encounter.  No orders of the defined types were placed in this encounter.   Face-to-face time spent with patient was *** minutes. Greater than 50% of time was spent in counseling and coordination of care.  Follow-Up Instructions: No follow-ups on file.   Ofilia Neas, PA-C  Note - This record has been created using Dragon software.  Chart creation errors have been sought, but may not always  have been located. Such creation errors do not reflect on  the standard of medical care.

## 2021-11-21 ENCOUNTER — Encounter (HOSPITAL_BASED_OUTPATIENT_CLINIC_OR_DEPARTMENT_OTHER): Payer: Self-pay | Admitting: Physical Therapy

## 2021-11-21 ENCOUNTER — Ambulatory Visit (HOSPITAL_BASED_OUTPATIENT_CLINIC_OR_DEPARTMENT_OTHER): Payer: Medicare PPO | Attending: Family Medicine | Admitting: Physical Therapy

## 2021-11-21 DIAGNOSIS — M5459 Other low back pain: Secondary | ICD-10-CM | POA: Diagnosis not present

## 2021-11-21 DIAGNOSIS — M25551 Pain in right hip: Secondary | ICD-10-CM | POA: Diagnosis not present

## 2021-11-21 DIAGNOSIS — M6281 Muscle weakness (generalized): Secondary | ICD-10-CM | POA: Diagnosis not present

## 2021-11-21 DIAGNOSIS — R262 Difficulty in walking, not elsewhere classified: Secondary | ICD-10-CM | POA: Diagnosis not present

## 2021-11-21 NOTE — Therapy (Signed)
OUTPATIENT PHYSICAL THERAPY LOWER EXTREMITY EVALUATION   Patient Name: Felicia [name] MRN: [id number] DOB:1940-08-09, 81 y.o., female Today's Date: 11/22/2021   PT End of Session - 11/21/21 1455     Visit Number 1    Number of Visits 12    Date for PT Re-Evaluation 01/02/22    Authorization Type [employer] medicare    PT Start Time 1354    PT Stop Time 1452    PT Time Calculation (min) 58 min    Activity Tolerance Patient tolerated treatment well    Behavior During Therapy [facility] for tasks assessed/performed             Past Medical History:  Diagnosis Date   Adenomatous colon polyp 2004   Colo in 2009 "no polyps"   Complication of anesthesia    small airway per patient   Echocardiogram    Echo 8/22 EF 60-65, no RWMA, mild LVH, normal RVSF, moderately elevated PASP (RVSP 50.9), trivial MR, mild AI, AV sclerosis without stenosis   GERD (gastroesophageal reflux disease)    Glaucoma    Hiatal hernia    Hypertension    Neuropathy    Nuclear stress test    Myoview 8/22: EF 65, no ischemia; low risk   Rheumatoid arthritis(714.0)    Orencia; Methotrexate (Dr. Patrecia Pour)   Sleep apnea    on CPAP   Past Surgical History:  Procedure Laterality Date   CARDIAC CATHETERIZATION N/A 03/29/2015   Procedure: Left Heart Cath and Coronary Angiography;  Surgeon: Belva Crome, MD;  Location: [facility];  Service: Cardiovascular;  Laterality: N/A;   CATARACT EXTRACTION, BILATERAL     COLONOSCOPY W/ POLYPECTOMY     X1; negative subsequently   DILATION AND CURETTAGE OF UTERUS     EYE SURGERY     due to RA   EYE SURGERY Left 06/2017   eyelid tendon    HYSTEROSCOPY WITH D & C  06/24/2012   Procedure: DILATATION AND CURETTAGE /HYSTEROSCOPY;  Surgeon: [name], MD;  Location: [facility];  Service: Gynecology;  Laterality: N/A;   SHOULDER SURGERY Left    due to RA   SQUAMOUS CELL CARCINOMA EXCISION     scalp and left leg   TRABECULECTOMY     X 2 OD; X 1 OS   UPPER  GASTROINTESTINAL ENDOSCOPY     hiatal hernia   Patient Active Problem List   Diagnosis Date Noted   Greater trochanteric pain syndrome of right lower extremity 10/23/2021   Age-related osteoporosis without current pathological fracture 06/26/2021   Aplastic anemia ([place]) 06/26/2021   Hiatal hernia 06/26/2021   Prediabetes 06/26/2021   Neuropathy 06/16/2020   SIADH (syndrome of inappropriate ADH production) ([place]) 02/18/2020   Hyponatremia 11/18/2019   Thrombocytosis 08/01/2018   URI (upper respiratory infection) 04/14/2017   Primary osteoarthritis of both knees 03/06/2017   Exertional chest pain    Pain in the chest 03/27/2015   Postural hypotension 05/26/2013   OSA (obstructive sleep apnea) 03/22/2013   Lens replaced by other means 08/25/2012   Low tension open-angle glaucoma(365.12) 02/14/2012   Osteoarthritis 11/07/2011   Abdominal bruit 10/16/2011   HTN (hypertension) 08/13/2011   Anisocoria 08/13/2011   Cervical radiculopathy 07/17/2011   DDD (degenerative disc disease), cervical 07/10/2011   Glaucoma 06/06/2011   HIATAL HERNIA 01/30/2010   POSTMENOPAUSAL SYNDROME 12/14/2009   EUSTACHIAN TUBE DYSFUNCTION, RIGHT 06/28/2009   GERD 12/07/2008   Rheumatoid arthritis ([place]) 12/07/2008   COLONIC POLYPS, HX OF 12/07/2008  Hyperlipidemia LDL goal <70 10/17/2006   HYPERCALCEMIA 10/17/2006   Mitral valve disease 10/17/2006    PCP: Kathyrn Lass, MD  REFERRING PROVIDER: de [place], Raymond J, MD   REFERRING DIAG: [id number] (ICD-10-CM) - Greater trochanteric pain syndrome of right lower extremity   THERAPY DIAG:  Pain in right hip  Other low back pain  Muscle weakness (generalized)  Difficulty in walking, not elsewhere classified  Rationale for Evaluation and Treatment Rehabilitation  ONSET DATE: May 2023  SUBJECTIVE:   SUBJECTIVE STATEMENT: Pt joined [facility] approx 1-1.5 months ago.  She has been walking around track and performing upper body machines.  Pt performed  an exercise class at [facility] approx 3-4 weeks ago and tried to perform all the exercises.  She began having R hip/thigh pain immediately after the class.  Pt saw MD and received an injection of the right greater trochanteric bursa on 10/23/2021.  Pt reports improved pain and sx's since receiving the injection.  MD note indicated pt to benefit from working on abductors.   Pt has pain with ambulation and is limited with ambulation distance.  Pt has increased pain with stairs.  She states she feels unsteady with gait and uses a walking stick for community ambulation.  Pt has pain with lying on bilat sides.    Pt has a hx of lumbar pain and lumbar radiculopathy into L LE pain and has received multiple lumbar injections with the most recent one being on 09/19/21.  Pt states the last 2 injections have not been as helpful.  MD note indicates MRI from 2022 exhibits multi-level facet hypertrophy, superiorly migrating left paracentral extrusion impinging on the descending L4 nerve root. Disc material and facet spurring also impinges on the descending left L5 nerve root.  Pt received PT for lumbar and reports improved sx's with PT.  Pt states she does some of her home exercises.    PERTINENT HISTORY: RA, neuropathy, osteoporosis, OA, and Hx of Lumbar radiculopathy and L LE pain  PAIN:  Are you having pain? yes NPRS:  Current:  2/10, Worst:  10/10, Best:  2/10 Location:  central lumbar, bilat lumbar flanks and post hip, R lateral hip Type:  dull, nagging  PRECAUTIONS: Other: Osteoporosis, RA, and hx of lumbar radiculopathy and L LE pain; lumbar anterolisthesis  WEIGHT BEARING RESTRICTIONS No  FALLS:  Has patient fallen in last 6 months? No   OCCUPATION: Pt is retired  PLOF: Independent  PATIENT GOALS to improve strength in legs, move better, and reduce pain   OBJECTIVE:   DIAGNOSTIC FINDINGS: Lumbar MRI on 07/19/2020: IMPRESSION: 1. Multilevel disc and facet degeneration with levoscoliosis  and L3-4, L4-5 anterolisthesis. 2. L4-5 left paracentral extrusion with superior migration and impingement on the left L4 and L5 nerve roots. No other neural impingement, suspect this is the symptomatic finding. 3. Active facet arthritis on the left at L4-5.  PATIENT SURVEYS:  FOTO 41 with a goal of 56 at visit #13  COGNITION:  Overall cognitive status: Within functional limits for tasks assessed       PALPATION: Increased tenderness at bilat GT.  Also tender to palpate bilat lateral hip and glute.  Very tender to palpate R IT and minimally on L.   LOWER EXTREMITY ROM:  Active ROM Right eval Left eval  Hip flexion    Hip extension    Hip abduction    Hip adduction    Hip internal rotation    Hip external rotation 28 27  Knee flexion  Knee extension    Ankle dorsiflexion    Ankle plantarflexion    Ankle inversion    Ankle eversion     (Blank rows = not tested)  LOWER EXTREMITY MMT:  MMT Right eval Left eval  Hip flexion 4+/5 5/5  Hip extension    Hip abduction 4-/5 4-/5  Hip adduction    Hip internal rotation    Hip external rotation 4/5 5/5  Knee flexion 5/5 4+/5  Knee extension 4/5 5/5  Ankle dorsiflexion    Ankle plantarflexion    Ankle inversion    Ankle eversion     (Blank rows = not tested)    GAIT: Assistive device utilized: None Level of assistance: Complete Independence Comments: Heel to toe gait pattern.  Very slow gait speed.  Limited pelvic rotation bilat.  Pt had no LOB but was a little wobbly at times.     TODAY'S TREATMENT: Educated pt in self massage of ITB.  Pt performed S/L clams on R 2x10 reps.  She attempted bridges but continued to have HS cramping even when adjusting knee flexion.  Pt received a HEP handout and was educated in correct form and appropriate frequency.  Pt instructed she should not have pain with HEP.   PATIENT EDUCATION:  Education details: dx, relevant anatomy, HEP, prognosis, objective findings, and POC.  PT  answered Pt's questions.  Person educated: Patient Education method: Explanation, Demonstration, Tactile cues, Verbal cues, and Handouts Education comprehension: verbalized understanding, returned demonstration, verbal cues required, tactile cues required, and needs further education   HOME EXERCISE PROGRAM: Access Code: [email or web address] URL: [email or web address] Date: 11/21/2021 Prepared by: Ronny Flurry  Exercises - Clamshell  - 1 x daily - 5-6 x weekly - 2 sets - 10 reps   And self massage to ITB  ASSESSMENT:  CLINICAL IMPRESSION: Patient is a 81 y.o. female with a dx of R greater trochanteric pain syndrome presenting to the clinic with R hip pain, LBP, muscle weakness, and difficulty in walking.  Pt reports improved pain and sx's since receiving the injection.  Pt has pain with ambulation and is limited with ambulation distance.  Pt has increased pain with stairs.  She also has pain with lying on bilat sides.  She states she feels unsteady with gait and uses a walking stick for community ambulation.  Pt has a hx of lumbar pain and lumbar radiculopathy into L LE.  Pt should benefit from skilled PT services to address impairments and to improve overall function.     OBJECTIVE IMPAIRMENTS Abnormal gait, decreased activity tolerance, decreased balance, decreased endurance, decreased mobility, difficulty walking, decreased strength, and pain.   ACTIVITY LIMITATIONS stairs and locomotion level  PARTICIPATION LIMITATIONS: community activity  PERSONAL FACTORS 3+ comorbidities: RA, neuropathy, osteoporosis, OA, and Hx of Lumbar radiculopathy and L LE pain  are also affecting patient's functional outcome.   REHAB POTENTIAL: Good  CLINICAL DECISION MAKING: Stable/uncomplicated  EVALUATION COMPLEXITY: Low   GOALS:   SHORT TERM GOALS: Target date:12/12/2021 Pt will be independent and compliant with HEP for improved pain, strength, and function.  Baseline: Goal status:  INITIAL  2.  Pt will demo improved gait speed without increased pain.  Baseline:  Goal status: INITIAL  3.  Pt will report at least a 25% improvement in pain and mobility.  Baseline:  Goal status: INITIAL    LONG TERM GOALS: Target date: 01/02/2022  Pt will demo improved R LE strength to 5/5 in hip flexion, ER, and knee extension and 4+/5  to hip abduction for improved performance of functional mobility.  Baseline:  Goal status: INITIAL  2.  Pt will ambulate extended community distance without significant pain and difficulty.   Baseline:  Goal status: INITIAL  3.  Pt will be able to perform stairs without significant hip pain. Baseline:  Goal status: INITIAL    PLAN: PT FREQUENCY: 2x/week  PT DURATION: 6 weeks  PLANNED INTERVENTIONS: Therapeutic exercises, Therapeutic activity, Neuromuscular re-education, Balance training, Gait training, Patient/Family education, Stair training, DME instructions, Aquatic Therapy, Dry Needling, Electrical stimulation, Cryotherapy, Moist heat, Taping, Ultrasound, Manual therapy, and Re-evaluation  PLAN FOR NEXT SESSION: STM to ITB.  Review and perform HEP.  Progress glute/hip strengthening per pt tolerance.  Attempt bridges again   Selinda Michaels III PT, DPT 11/22/21 10:50 PM

## 2021-11-26 ENCOUNTER — Ambulatory Visit: Payer: Medicare PPO | Admitting: Physician Assistant

## 2021-11-26 ENCOUNTER — Encounter: Payer: Self-pay | Admitting: Physician Assistant

## 2021-11-26 VITALS — BP 130/75 | HR 80 | Ht 65.0 in | Wt 135.0 lb

## 2021-11-26 DIAGNOSIS — M0579 Rheumatoid arthritis with rheumatoid factor of multiple sites without organ or systems involvement: Secondary | ICD-10-CM

## 2021-11-26 DIAGNOSIS — Z8589 Personal history of malignant neoplasm of other organs and systems: Secondary | ICD-10-CM | POA: Diagnosis not present

## 2021-11-26 DIAGNOSIS — M5030 Other cervical disc degeneration, unspecified cervical region: Secondary | ICD-10-CM | POA: Diagnosis not present

## 2021-11-26 DIAGNOSIS — Z8639 Personal history of other endocrine, nutritional and metabolic disease: Secondary | ICD-10-CM

## 2021-11-26 DIAGNOSIS — G629 Polyneuropathy, unspecified: Secondary | ICD-10-CM

## 2021-11-26 DIAGNOSIS — M810 Age-related osteoporosis without current pathological fracture: Secondary | ICD-10-CM

## 2021-11-26 DIAGNOSIS — Z8669 Personal history of other diseases of the nervous system and sense organs: Secondary | ICD-10-CM

## 2021-11-26 DIAGNOSIS — M170 Bilateral primary osteoarthritis of knee: Secondary | ICD-10-CM | POA: Diagnosis not present

## 2021-11-26 DIAGNOSIS — Z79899 Other long term (current) drug therapy: Secondary | ICD-10-CM

## 2021-11-26 DIAGNOSIS — M7061 Trochanteric bursitis, right hip: Secondary | ICD-10-CM

## 2021-11-26 DIAGNOSIS — Z8719 Personal history of other diseases of the digestive system: Secondary | ICD-10-CM

## 2021-11-26 DIAGNOSIS — I2510 Atherosclerotic heart disease of native coronary artery without angina pectoris: Secondary | ICD-10-CM

## 2021-11-26 DIAGNOSIS — M5136 Other intervertebral disc degeneration, lumbar region: Secondary | ICD-10-CM

## 2021-11-26 DIAGNOSIS — M19042 Primary osteoarthritis, left hand: Secondary | ICD-10-CM

## 2021-11-26 DIAGNOSIS — Z86010 Personal history of colonic polyps: Secondary | ICD-10-CM

## 2021-11-26 DIAGNOSIS — M19041 Primary osteoarthritis, right hand: Secondary | ICD-10-CM | POA: Diagnosis not present

## 2021-11-26 DIAGNOSIS — E222 Syndrome of inappropriate secretion of antidiuretic hormone: Secondary | ICD-10-CM

## 2021-11-26 DIAGNOSIS — I10 Essential (primary) hypertension: Secondary | ICD-10-CM

## 2021-11-26 MED ORDER — METHOTREXATE 2.5 MG PO TABS: 10.0000 mg | ORAL_TABLET | ORAL | 0 refills | Status: DC

## 2021-12-03 ENCOUNTER — Encounter: Payer: Self-pay | Admitting: Physician Assistant

## 2021-12-03 DIAGNOSIS — M85831 Other specified disorders of bone density and structure, right forearm: Secondary | ICD-10-CM | POA: Diagnosis not present

## 2021-12-03 DIAGNOSIS — Z780 Asymptomatic menopausal state: Secondary | ICD-10-CM | POA: Diagnosis not present

## 2021-12-05 ENCOUNTER — Encounter (HOSPITAL_BASED_OUTPATIENT_CLINIC_OR_DEPARTMENT_OTHER): Payer: Self-pay | Admitting: Physical Therapy

## 2021-12-05 ENCOUNTER — Ambulatory Visit (HOSPITAL_BASED_OUTPATIENT_CLINIC_OR_DEPARTMENT_OTHER): Payer: Medicare PPO | Attending: Family Medicine | Admitting: Physical Therapy

## 2021-12-05 DIAGNOSIS — M5459 Other low back pain: Secondary | ICD-10-CM

## 2021-12-05 DIAGNOSIS — M6281 Muscle weakness (generalized): Secondary | ICD-10-CM

## 2021-12-05 DIAGNOSIS — M25551 Pain in right hip: Secondary | ICD-10-CM | POA: Diagnosis not present

## 2021-12-05 DIAGNOSIS — R262 Difficulty in walking, not elsewhere classified: Secondary | ICD-10-CM

## 2021-12-05 NOTE — Therapy (Signed)
OUTPATIENT PHYSICAL THERAPY LOWER EXTREMITY EVALUATION   Patient Name: RANELL FINELLI MRN: [id number] DOB:March 13, 1941, 81 y.o., female Today's Date: 12/05/2021   PT End of Session - 12/05/21 1413     Visit Number 2    Number of Visits 12    Date for PT Re-Evaluation 01/02/22    Authorization Type [employer] medicare    PT Start Time 1347    PT Stop Time 1427    PT Time Calculation (min) 40 min    Activity Tolerance Patient tolerated treatment well    Behavior During Therapy [facility] for tasks assessed/performed              Past Medical History:  Diagnosis Date   Adenomatous colon polyp 2004   Colo in 2009 "no polyps"   Complication of anesthesia    small airway per patient   Echocardiogram    Echo 8/22 EF 60-65, no RWMA, mild LVH, normal RVSF, moderately elevated PASP (RVSP 50.9), trivial MR, mild AI, AV sclerosis without stenosis   GERD (gastroesophageal reflux disease)    Glaucoma    Hiatal hernia    Hypertension    Neuropathy    Nuclear stress test    Myoview 8/22: EF 65, no ischemia; low risk   Rheumatoid arthritis(714.0)    Orencia; Methotrexate (Dr. Patrecia Pour)   Sleep apnea    on CPAP   Past Surgical History:  Procedure Laterality Date   CARDIAC CATHETERIZATION N/A 03/29/2015   Procedure: Left Heart Cath and Coronary Angiography;  Surgeon: Belva Crome, MD;  Location: [place] [facility];  Service: Cardiovascular;  Laterality: N/A;   CATARACT EXTRACTION, BILATERAL     COLONOSCOPY W/ POLYPECTOMY     X1; negative subsequently   DILATION AND CURETTAGE OF UTERUS     EYE SURGERY     due to RA   EYE SURGERY Left 06/2017   eyelid tendon    HYSTEROSCOPY WITH D & C  06/24/2012   Procedure: DILATATION AND CURETTAGE /HYSTEROSCOPY;  Surgeon: [name], MD;  Location: [place] [facility];  Service: Gynecology;  Laterality: N/A;   SHOULDER SURGERY Left    due to RA   SQUAMOUS CELL CARCINOMA EXCISION     scalp and left leg   TRABECULECTOMY     X 2 OD; X 1 OS   UPPER  GASTROINTESTINAL ENDOSCOPY     hiatal hernia   Patient Active Problem List   Diagnosis Date Noted   Greater trochanteric pain syndrome of right lower extremity 10/23/2021   Age-related osteoporosis without current pathological fracture 06/26/2021   Aplastic anemia ([place]) 06/26/2021   Hiatal hernia 06/26/2021   Prediabetes 06/26/2021   Neuropathy 06/16/2020   SIADH (syndrome of inappropriate ADH production) ([place]) 02/18/2020   Hyponatremia 11/18/2019   Thrombocytosis 08/01/2018   URI (upper respiratory infection) 04/14/2017   Primary osteoarthritis of both knees 03/06/2017   Exertional chest pain    Pain in the chest 03/27/2015   Postural hypotension 05/26/2013   OSA (obstructive sleep apnea) 03/22/2013   Lens replaced by other means 08/25/2012   Low tension open-angle glaucoma(365.12) 02/14/2012   Osteoarthritis 11/07/2011   Abdominal bruit 10/16/2011   HTN (hypertension) 08/13/2011   Anisocoria 08/13/2011   Cervical radiculopathy 07/17/2011   DDD (degenerative disc disease), cervical 07/10/2011   Glaucoma 06/06/2011   HIATAL HERNIA 01/30/2010   POSTMENOPAUSAL SYNDROME 12/14/2009   EUSTACHIAN TUBE DYSFUNCTION, RIGHT 06/28/2009   GERD 12/07/2008   Rheumatoid arthritis ([place]) 12/07/2008   COLONIC POLYPS, HX OF  12/07/2008   Hyperlipidemia LDL goal <70 10/17/2006   HYPERCALCEMIA 10/17/2006   Mitral valve disease 10/17/2006    PCP: Kathyrn Lass, MD  REFERRING PROVIDER: de [place], Raymond J, MD   REFERRING DIAG: [phone] (ICD-10-CM) - Greater trochanteric pain syndrome of right lower extremity   THERAPY DIAG:  Pain in right hip  Other low back pain  Muscle weakness (generalized)  Difficulty in walking, not elsewhere classified  Rationale for Evaluation and Treatment Rehabilitation  ONSET DATE: May 2023  SUBJECTIVE:   SUBJECTIVE STATEMENT:  I'm feeling well today, after I came last time I had more bursitis pain but I'm not sure if this is related to what we did in  PT.    PERTINENT HISTORY: RA, neuropathy, osteoporosis, OA, and Hx of Lumbar radiculopathy and L LE pain  PAIN:  Are you having pain? yes NPRS:  Current:  2/10, Worst:  10/10, Best:  2/10 Location:  central lumbar, bilat lumbar flanks and post hip, R lateral hip Type:  dull, nagging  PRECAUTIONS: Other: Osteoporosis, RA, and hx of lumbar radiculopathy and L LE pain; lumbar anterolisthesis  WEIGHT BEARING RESTRICTIONS No  FALLS:  Has patient fallen in last 6 months? No   OCCUPATION: Pt is retired  PLOF: Independent  PATIENT GOALS to improve strength in legs, move better, and reduce pain  TODAY'S TREATMENT 12/05/21  Bridges 2x10 2 second holds  PPT 2x15 with 3 second holds  Lumbar rotation stretch 5x5 seconds B SKTC 5x5 second holds B   STM R hip ABD muscles and lateral thigh    OBJECTIVE:   DIAGNOSTIC FINDINGS: Lumbar MRI on 07/19/2020: IMPRESSION: 1. Multilevel disc and facet degeneration with levoscoliosis and L3-4, L4-5 anterolisthesis. 2. L4-5 left paracentral extrusion with superior migration and impingement on the left L4 and L5 nerve roots. No other neural impingement, suspect this is the symptomatic finding. 3. Active facet arthritis on the left at L4-5.  PATIENT SURVEYS:  FOTO 41 with a goal of 56 at visit #13  COGNITION:  Overall cognitive status: Within functional limits for tasks assessed       PALPATION: Increased tenderness at bilat GT.  Also tender to palpate bilat lateral hip and glute.  Very tender to palpate R IT and minimally on L.   LOWER EXTREMITY ROM:  Active ROM Right eval Left eval  Hip flexion    Hip extension    Hip abduction    Hip adduction    Hip internal rotation    Hip external rotation 28 27  Knee flexion    Knee extension    Ankle dorsiflexion    Ankle plantarflexion    Ankle inversion    Ankle eversion     (Blank rows = not tested)  LOWER EXTREMITY MMT:  MMT Right eval Left eval  Hip flexion 4+/5 5/5  Hip  extension    Hip abduction 4-/5 4-/5  Hip adduction    Hip internal rotation    Hip external rotation 4/5 5/5  Knee flexion 5/5 4+/5  Knee extension 4/5 5/5  Ankle dorsiflexion    Ankle plantarflexion    Ankle inversion    Ankle eversion     (Blank rows = not tested)    GAIT: Assistive device utilized: None Level of assistance: Complete Independence Comments: Heel to toe gait pattern.  Very slow gait speed.  Limited pelvic rotation bilat.  Pt had no LOB but was a little wobbly at times.     TODAY'S TREATMENT: Educated pt in  self massage of ITB.  Pt performed S/L clams on R 2x10 reps.  She attempted bridges but continued to have HS cramping even when adjusting knee flexion.  Pt received a HEP handout and was educated in correct form and appropriate frequency.  Pt instructed she should not have pain with HEP.   PATIENT EDUCATION:  Education details: relevant anatomy and how PT helps resolve pain, benefits of seeing massage therapist to help reduce soft tissue limitations and local contacts, POC next session  Person educated: Patient Education method: Explanation, Demonstration, Tactile cues, Verbal cues, and Handouts Education comprehension: verbalized understanding, returned demonstration, verbal cues required, tactile cues required, and needs further education   HOME EXERCISE PROGRAM: Access Code: [email or web address] URL: [email or web address] Date: 11/21/2021 Prepared by: Ronny Flurry  Exercises - Clamshell  - 1 x daily - 5-6 x weekly - 2 sets - 10 reps   And self massage to ITB  ASSESSMENT:  CLINICAL IMPRESSION:  Sincerity arrives today doing OK, still having a lot of tenderness lateral hips and still hard for her to lay on her sides due to hip pain. Worked on core/hip strengthening and mobility today, also spent some time trying to address soft tissue restrictions in lateral thighs and hips. Seemed to tolerate today's session well, will continue to progress as able.     OBJECTIVE IMPAIRMENTS Abnormal gait, decreased activity tolerance, decreased balance, decreased endurance, decreased mobility, difficulty walking, decreased strength, and pain.   ACTIVITY LIMITATIONS stairs and locomotion level  PARTICIPATION LIMITATIONS: community activity  PERSONAL FACTORS 3+ comorbidities: RA, neuropathy, osteoporosis, OA, and Hx of Lumbar radiculopathy and L LE pain  are also affecting patient's functional outcome.   REHAB POTENTIAL: Good  CLINICAL DECISION MAKING: Stable/uncomplicated  EVALUATION COMPLEXITY: Low   GOALS:   SHORT TERM GOALS: Target date:12/12/2021 Pt will be independent and compliant with HEP for improved pain, strength, and function.  Baseline: Goal status: INITIAL  2.  Pt will demo improved gait speed without increased pain.  Baseline:  Goal status: INITIAL  3.  Pt will report at least a 25% improvement in pain and mobility.  Baseline:  Goal status: INITIAL    LONG TERM GOALS: Target date: 01/02/2022  Pt will demo improved R LE strength to 5/5 in hip flexion, ER, and knee extension and 4+/5 to hip abduction for improved performance of functional mobility.  Baseline:  Goal status: INITIAL  2.  Pt will ambulate extended community distance without significant pain and difficulty.   Baseline:  Goal status: INITIAL  3.  Pt will be able to perform stairs without significant hip pain. Baseline:  Goal status: INITIAL    PLAN: PT FREQUENCY: 2x/week  PT DURATION: 6 weeks  PLANNED INTERVENTIONS: Therapeutic exercises, Therapeutic activity, Neuromuscular re-education, Balance training, Gait training, Patient/Family education, Stair training, DME instructions, Aquatic Therapy, Dry Needling, Electrical stimulation, Cryotherapy, Moist heat, Taping, Ultrasound, Manual therapy, and Re-evaluation  PLAN FOR NEXT SESSION: STM to ITB.  Review and perform HEP.  Progress glute/hip strengthening per pt tolerance.  Attempt bridges  again    Abdulraheem Pineo U PT DPT PN2  12/05/2021, 2:27 PM

## 2021-12-07 ENCOUNTER — Encounter (HOSPITAL_BASED_OUTPATIENT_CLINIC_OR_DEPARTMENT_OTHER): Payer: Self-pay | Admitting: Physical Therapy

## 2021-12-07 ENCOUNTER — Ambulatory Visit (HOSPITAL_BASED_OUTPATIENT_CLINIC_OR_DEPARTMENT_OTHER): Payer: Medicare PPO | Admitting: Physical Therapy

## 2021-12-07 ENCOUNTER — Other Ambulatory Visit: Payer: Self-pay | Admitting: Internal Medicine

## 2021-12-07 DIAGNOSIS — M6281 Muscle weakness (generalized): Secondary | ICD-10-CM | POA: Diagnosis not present

## 2021-12-07 DIAGNOSIS — R262 Difficulty in walking, not elsewhere classified: Secondary | ICD-10-CM | POA: Diagnosis not present

## 2021-12-07 DIAGNOSIS — M5459 Other low back pain: Secondary | ICD-10-CM | POA: Diagnosis not present

## 2021-12-07 DIAGNOSIS — M25551 Pain in right hip: Secondary | ICD-10-CM

## 2021-12-07 NOTE — Therapy (Signed)
OUTPATIENT PHYSICAL THERAPY LOWER EXTREMITY EVALUATION   Patient Name: Felicia [name] MRN: [id number] DOB:1941/02/04, 81 y.o., female Today's Date: 12/07/2021   PT End of Session - 12/07/21 1427     Visit Number 3    Number of Visits 12    Date for PT Re-Evaluation 01/02/22    Authorization Type [employer] medicare    PT Start Time 1517    PT Stop Time 1427    PT Time Calculation (min) 38 min    Activity Tolerance Patient tolerated treatment well    Behavior During Therapy [facility] for tasks assessed/performed               Past Medical History:  Diagnosis Date   Adenomatous colon polyp 2004   Colo in 2009 "no polyps"   Complication of anesthesia    small airway per patient   Echocardiogram    Echo 8/22 EF 60-65, no RWMA, mild LVH, normal RVSF, moderately elevated PASP (RVSP 50.9), trivial MR, mild AI, AV sclerosis without stenosis   GERD (gastroesophageal reflux disease)    Glaucoma    Hiatal hernia    Hypertension    Neuropathy    Nuclear stress test    Myoview 8/22: EF 65, no ischemia; low risk   Rheumatoid arthritis(714.0)    Orencia; Methotrexate (Dr. Patrecia Pour)   Sleep apnea    on CPAP   Past Surgical History:  Procedure Laterality Date   CARDIAC CATHETERIZATION N/A 03/29/2015   Procedure: Left Heart Cath and Coronary Angiography;  Surgeon: Belva Crome, MD;  Location: [place] [facility];  Service: Cardiovascular;  Laterality: N/A;   CATARACT EXTRACTION, BILATERAL     COLONOSCOPY W/ POLYPECTOMY     X1; negative subsequently   DILATION AND CURETTAGE OF UTERUS     EYE SURGERY     due to RA   EYE SURGERY Left 06/2017   eyelid tendon    HYSTEROSCOPY WITH D & C  06/24/2012   Procedure: DILATATION AND CURETTAGE /HYSTEROSCOPY;  Surgeon: [name], MD;  Location: [place] [facility];  Service: Gynecology;  Laterality: N/A;   SHOULDER SURGERY Left    due to RA   SQUAMOUS CELL CARCINOMA EXCISION     scalp and left leg   TRABECULECTOMY     X 2 OD; X 1 OS   UPPER  GASTROINTESTINAL ENDOSCOPY     hiatal hernia   Patient Active Problem List   Diagnosis Date Noted   Greater trochanteric pain syndrome of right lower extremity 10/23/2021   Age-related osteoporosis without current pathological fracture 06/26/2021   Aplastic anemia ([place]) 06/26/2021   Hiatal hernia 06/26/2021   Prediabetes 06/26/2021   Neuropathy 06/16/2020   SIADH (syndrome of inappropriate ADH production) ([place]) 02/18/2020   Hyponatremia 11/18/2019   Thrombocytosis 08/01/2018   URI (upper respiratory infection) 04/14/2017   Primary osteoarthritis of both knees 03/06/2017   Exertional chest pain    Pain in the chest 03/27/2015   Postural hypotension 05/26/2013   OSA (obstructive sleep apnea) 03/22/2013   Lens replaced by other means 08/25/2012   Low tension open-angle glaucoma(365.12) 02/14/2012   Osteoarthritis 11/07/2011   Abdominal bruit 10/16/2011   HTN (hypertension) 08/13/2011   Anisocoria 08/13/2011   Cervical radiculopathy 07/17/2011   DDD (degenerative disc disease), cervical 07/10/2011   Glaucoma 06/06/2011   HIATAL HERNIA 01/30/2010   POSTMENOPAUSAL SYNDROME 12/14/2009   EUSTACHIAN TUBE DYSFUNCTION, RIGHT 06/28/2009   GERD 12/07/2008   Rheumatoid arthritis ([place]) 12/07/2008   COLONIC POLYPS, HX  OF 12/07/2008   Hyperlipidemia LDL goal <70 10/17/2006   HYPERCALCEMIA 10/17/2006   Mitral valve disease 10/17/2006    PCP: Kathyrn Lass, MD  REFERRING PROVIDER: [place], Raymond J, MD   REFERRING DIAG: [phone] (ICD-10-CM) - Greater trochanteric pain syndrome of right lower extremity   THERAPY DIAG:  Pain in right hip  Other low back pain  Muscle weakness (generalized)  Difficulty in walking, not elsewhere classified  Rationale for Evaluation and Treatment Rehabilitation  ONSET DATE: May 2023  SUBJECTIVE:   SUBJECTIVE STATEMENT:  I felt OK after last time R side is feeling better. Doesn't feel like anything is going to happen on that side in terms of pain.  Can we do some soft tissue work on the left today?    PERTINENT HISTORY: RA, neuropathy, osteoporosis, OA, and Hx of Lumbar radiculopathy and L LE pain  PAIN:  Are you having pain? yes NPRS:  Current:  2/10, Worst:  10/10, Best:  2/10 Location:  low back today Type:  achey and dull   PRECAUTIONS: Other: Osteoporosis, RA, and hx of lumbar radiculopathy and L LE pain; lumbar anterolisthesis  WEIGHT BEARING RESTRICTIONS No  FALLS:  Has patient fallen in last 6 months? No   OCCUPATION: Pt is retired  PLOF: Independent  PATIENT GOALS to improve strength in legs, move better, and reduce pain  TODAY'S TREATMENT 12/07/21  Bridges 1x15 with 2 second holds  PPT 1x20 with 3 second holds  Supine clams red TB 1x15 2 second holds PPT with march 1x10  Supine hip ABD 2x5 B red TB at ankles  Dead bugs 2x5 B with TrA set   Nustep L3 x6 minutes BLEs only   IASTM L lateral thigh and TFL    OBJECTIVE:   DIAGNOSTIC FINDINGS: Lumbar MRI on 07/19/2020: IMPRESSION: 1. Multilevel disc and facet degeneration with levoscoliosis and L3-4, L4-5 anterolisthesis. 2. L4-5 left paracentral extrusion with superior migration and impingement on the left L4 and L5 nerve roots. No other neural impingement, suspect this is the symptomatic finding. 3. Active facet arthritis on the left at L4-5.  PATIENT SURVEYS:  FOTO 41 with a goal of 56 at visit #13  COGNITION:  Overall cognitive status: Within functional limits for tasks assessed       PALPATION: Increased tenderness at bilat GT.  Also tender to palpate bilat lateral hip and glute.  Very tender to palpate R IT and minimally on L.   LOWER EXTREMITY ROM:  Active ROM Right eval Left eval  Hip flexion    Hip extension    Hip abduction    Hip adduction    Hip internal rotation    Hip external rotation 28 27  Knee flexion    Knee extension    Ankle dorsiflexion    Ankle plantarflexion    Ankle inversion    Ankle eversion     (Blank  rows = not tested)  LOWER EXTREMITY MMT:  MMT Right eval Left eval  Hip flexion 4+/5 5/5  Hip extension    Hip abduction 4-/5 4-/5  Hip adduction    Hip internal rotation    Hip external rotation 4/5 5/5  Knee flexion 5/5 4+/5  Knee extension 4/5 5/5  Ankle dorsiflexion    Ankle plantarflexion    Ankle inversion    Ankle eversion     (Blank rows = not tested)    GAIT: Assistive device utilized: None Level of assistance: Complete Independence Comments: Heel to toe gait pattern.  Very slow gait speed.  Limited pelvic rotation bilat.  Pt had no LOB but was a little wobbly at times.     TODAY'S TREATMENT: Educated pt in self massage of ITB.  Pt performed S/L clams on R 2x10 reps.  She attempted bridges but continued to have HS cramping even when adjusting knee flexion.  Pt received a HEP handout and was educated in correct form and appropriate frequency.  Pt instructed she should not have pain with HEP.   PATIENT EDUCATION:  Education details: relevant anatomy and how PT helps resolve pain, benefits of seeing massage therapist to help reduce soft tissue limitations and local contacts, POC next session  Person educated: Patient Education method: Explanation, Demonstration, Tactile cues, Verbal cues, and Handouts Education comprehension: verbalized understanding, returned demonstration, verbal cues required, tactile cues required, and needs further education   HOME EXERCISE PROGRAM: Access Code: [email or web address] URL: [email or web address] Date: 11/21/2021 Prepared by: Ronny Flurry  Exercises - Clamshell  - 1 x daily - 5-6 x weekly - 2 sets - 10 reps   And self massage to ITB  ASSESSMENT:  CLINICAL IMPRESSION:   Oliviagrace arrives today doing well, seems like the interventions from last session really helped as her R LE was feeling better today. Spent time progressing core and hip strength, also introduced a bit of resistance on the Nustep as well. Finished session  with STM to L LE per patient request today. Updated HEP as well.   OBJECTIVE IMPAIRMENTS Abnormal gait, decreased activity tolerance, decreased balance, decreased endurance, decreased mobility, difficulty walking, decreased strength, and pain.   ACTIVITY LIMITATIONS stairs and locomotion level  PARTICIPATION LIMITATIONS: community activity  PERSONAL FACTORS 3+ comorbidities: RA, neuropathy, osteoporosis, OA, and Hx of Lumbar radiculopathy and L LE pain  are also affecting patient's functional outcome.   REHAB POTENTIAL: Good  CLINICAL DECISION MAKING: Stable/uncomplicated  EVALUATION COMPLEXITY: Low   GOALS:   SHORT TERM GOALS: Target date:12/12/2021 Pt will be independent and compliant with HEP for improved pain, strength, and function.  Baseline: Goal status: INITIAL  2.  Pt will demo improved gait speed without increased pain.  Baseline:  Goal status: INITIAL  3.  Pt will report at least a 25% improvement in pain and mobility.  Baseline:  Goal status: INITIAL    LONG TERM GOALS: Target date: 01/02/2022  Pt will demo improved R LE strength to 5/5 in hip flexion, ER, and knee extension and 4+/5 to hip abduction for improved performance of functional mobility.  Baseline:  Goal status: INITIAL  2.  Pt will ambulate extended community distance without significant pain and difficulty.   Baseline:  Goal status: INITIAL  3.  Pt will be able to perform stairs without significant hip pain. Baseline:  Goal status: INITIAL    PLAN: PT FREQUENCY: 2x/week  PT DURATION: 6 weeks  PLANNED INTERVENTIONS: Therapeutic exercises, Therapeutic activity, Neuromuscular re-education, Balance training, Gait training, Patient/Family education, Stair training, DME instructions, Aquatic Therapy, Dry Needling, Electrical stimulation, Cryotherapy, Moist heat, Taping, Ultrasound, Manual therapy, and Re-evaluation  PLAN FOR NEXT SESSION: STM to ITB.  Review and perform HEP.  Progress  glute/hip strengthening per pt tolerance.  Consider DN to lateral thighs?     Ann Lions PT DPT PN2  12/07/2021, 2:28 PM

## 2021-12-09 NOTE — Therapy (Signed)
OUTPATIENT PHYSICAL THERAPY LOWER EXTREMITY EVALUATION   Patient Name: Felicia [name] MRN: [id number] DOB:02-16-1941, 81 y.o., female Today's Date: 12/09/2021       Past Medical History:  Diagnosis Date   Adenomatous colon polyp 2004   Colo in 2009 "no polyps"   Complication of anesthesia    small airway per patient   Echocardiogram    Echo 8/22 EF 60-65, no RWMA, mild LVH, normal RVSF, moderately elevated PASP (RVSP 50.9), trivial MR, mild AI, AV sclerosis without stenosis   GERD (gastroesophageal reflux disease)    Glaucoma    Hiatal hernia    Hypertension    Neuropathy    Nuclear stress test    Myoview 8/22: EF 65, no ischemia; low risk   Rheumatoid arthritis(714.0)    Orencia; Methotrexate (Dr. Patrecia Pour)   Sleep apnea    on CPAP   Past Surgical History:  Procedure Laterality Date   CARDIAC CATHETERIZATION N/A 03/29/2015   Procedure: Left Heart Cath and Coronary Angiography;  Surgeon: Belva Crome, MD;  Location: [facility];  Service: Cardiovascular;  Laterality: N/A;   CATARACT EXTRACTION, BILATERAL     COLONOSCOPY W/ POLYPECTOMY     X1; negative subsequently   DILATION AND CURETTAGE OF UTERUS     EYE SURGERY     due to RA   EYE SURGERY Left 06/2017   eyelid tendon    HYSTEROSCOPY WITH D & C  06/24/2012   Procedure: DILATATION AND CURETTAGE /HYSTEROSCOPY;  Surgeon: [name], MD;  Location: [facility];  Service: Gynecology;  Laterality: N/A;   SHOULDER SURGERY Left    due to RA   SQUAMOUS CELL CARCINOMA EXCISION     scalp and left leg   TRABECULECTOMY     X 2 OD; X 1 OS   UPPER GASTROINTESTINAL ENDOSCOPY     hiatal hernia   Patient Active Problem List   Diagnosis Date Noted   Greater trochanteric pain syndrome of right lower extremity 10/23/2021   Age-related osteoporosis without current pathological fracture 06/26/2021   Aplastic anemia ([place]) 06/26/2021   Hiatal hernia 06/26/2021   Prediabetes 06/26/2021   Neuropathy 06/16/2020   SIADH  (syndrome of inappropriate ADH production) ([place]) 02/18/2020   Hyponatremia 11/18/2019   Thrombocytosis 08/01/2018   URI (upper respiratory infection) 04/14/2017   Primary osteoarthritis of both knees 03/06/2017   Exertional chest pain    Pain in the chest 03/27/2015   Postural hypotension 05/26/2013   OSA (obstructive sleep apnea) 03/22/2013   Lens replaced by other means 08/25/2012   Low tension open-angle glaucoma(365.12) 02/14/2012   Osteoarthritis 11/07/2011   Abdominal bruit 10/16/2011   HTN (hypertension) 08/13/2011   Anisocoria 08/13/2011   Cervical radiculopathy 07/17/2011   DDD (degenerative disc disease), cervical 07/10/2011   Glaucoma 06/06/2011   HIATAL HERNIA 01/30/2010   POSTMENOPAUSAL SYNDROME 12/14/2009   EUSTACHIAN TUBE DYSFUNCTION, RIGHT 06/28/2009   GERD 12/07/2008   Rheumatoid arthritis ([place]) 12/07/2008   COLONIC POLYPS, HX OF 12/07/2008   Hyperlipidemia LDL goal <70 10/17/2006   HYPERCALCEMIA 10/17/2006   Mitral valve disease 10/17/2006    PCP: Kathyrn Lass, MD  REFERRING PROVIDER: de [place], Raymond J, MD   REFERRING DIAG: [phone] (ICD-10-CM) - Greater trochanteric pain syndrome of right lower extremity   THERAPY DIAG:  No diagnosis found.  Rationale for Evaluation and Treatment Rehabilitation  ONSET DATE: May 2023  SUBJECTIVE:   SUBJECTIVE STATEMENT:  I felt OK after last time R side is feeling better. Doesn't  feel like anything is going to happen on that side in terms of pain. Can we do some soft tissue work on the left today?    PERTINENT HISTORY: RA, neuropathy, osteoporosis, OA, and Hx of Lumbar radiculopathy and L LE pain  PAIN:  Are you having pain? yes NPRS:  Current:  2/10, Worst:  10/10, Best:  2/10 Location:  low back today Type:  achey and dull   PRECAUTIONS: Other: Osteoporosis, RA, and hx of lumbar radiculopathy and L LE pain; lumbar anterolisthesis  WEIGHT BEARING RESTRICTIONS No  FALLS:  Has patient fallen in last 6  months? No   OCCUPATION: Pt is retired  PLOF: Independent  PATIENT GOALS to improve strength in legs, move better, and reduce pain  TODAY'S TREATMENT 12/07/21   Therapeutic Exercise: Constance Haw 1x15 with 2 second holds  PPT 1x20 with 3 second holds  Supine clams red TB 1x15 2 second holds PPT with march 1x10  Supine hip ABD 2x5 B red TB at ankles  Dead bugs 2x5 B with TrA set   Nustep L3 x6 minutes BLEs only   Manual Therapy: Pt received STM and rolling to bilat glutes, lateral hip and ITB to improve soft tissue tightness, mobility, and pain.   IASTM L lateral thigh and TFL    OBJECTIVE:   DIAGNOSTIC FINDINGS: Lumbar MRI on 07/19/2020: IMPRESSION: 1. Multilevel disc and facet degeneration with levoscoliosis and L3-4, L4-5 anterolisthesis. 2. L4-5 left paracentral extrusion with superior migration and impingement on the left L4 and L5 nerve roots. No other neural impingement, suspect this is the symptomatic finding. 3. Active facet arthritis on the left at L4-5.  PATIENT SURVEYS:  FOTO 41 with a goal of 56 at visit #13  COGNITION:  Overall cognitive status: Within functional limits for tasks assessed       PALPATION: Increased tenderness at bilat GT.  Also tender to palpate bilat lateral hip and glute.  Very tender to palpate R IT and minimally on L.   LOWER EXTREMITY ROM:  Active ROM Right eval Left eval  Hip flexion    Hip extension    Hip abduction    Hip adduction    Hip internal rotation    Hip external rotation 28 27  Knee flexion    Knee extension    Ankle dorsiflexion    Ankle plantarflexion    Ankle inversion    Ankle eversion     (Blank rows = not tested)  LOWER EXTREMITY MMT:  MMT Right eval Left eval  Hip flexion 4+/5 5/5  Hip extension    Hip abduction 4-/5 4-/5  Hip adduction    Hip internal rotation    Hip external rotation 4/5 5/5  Knee flexion 5/5 4+/5  Knee extension 4/5 5/5  Ankle dorsiflexion    Ankle plantarflexion     Ankle inversion    Ankle eversion     (Blank rows = not tested)    GAIT: Assistive device utilized: None Level of assistance: Complete Independence Comments: Heel to toe gait pattern.  Very slow gait speed.  Limited pelvic rotation bilat.  Pt had no LOB but was a little wobbly at times.     TODAY'S TREATMENT: Educated pt in self massage of ITB.  Pt performed S/L clams on R 2x10 reps.  She attempted bridges but continued to have HS cramping even when adjusting knee flexion.  Pt received a HEP handout and was educated in correct form and appropriate frequency.  Pt instructed she should  not have pain with HEP.   PATIENT EDUCATION:  Education details: relevant anatomy and how PT helps resolve pain, benefits of seeing massage therapist to help reduce soft tissue limitations and local contacts, POC next session  Person educated: Patient Education method: Explanation, Demonstration, Tactile cues, Verbal cues, and Handouts Education comprehension: verbalized understanding, returned demonstration, verbal cues required, tactile cues required, and needs further education   HOME EXERCISE PROGRAM: Access Code: [email or web address] URL: [email or web address] Date: 11/21/2021 Prepared by: Ronny Flurry  Exercises - Clamshell  - 1 x daily - 5-6 x weekly - 2 sets - 10 reps   And self massage to ITB  ASSESSMENT:  CLINICAL IMPRESSION:   Donnajean arrives today doing well, seems like the interventions from last session really helped as her R LE was feeling better today. Spent time progressing core and hip strength, also introduced a bit of resistance on the Nustep as well. Finished session with STM to L LE per patient request today. Updated HEP as well.   OBJECTIVE IMPAIRMENTS Abnormal gait, decreased activity tolerance, decreased balance, decreased endurance, decreased mobility, difficulty walking, decreased strength, and pain.   ACTIVITY LIMITATIONS stairs and locomotion  level  PARTICIPATION LIMITATIONS: community activity  PERSONAL FACTORS 3+ comorbidities: RA, neuropathy, osteoporosis, OA, and Hx of Lumbar radiculopathy and L LE pain  are also affecting patient's functional outcome.   REHAB POTENTIAL: Good  CLINICAL DECISION MAKING: Stable/uncomplicated  EVALUATION COMPLEXITY: Low   GOALS:   SHORT TERM GOALS: Target date:12/12/2021 Pt will be independent and compliant with HEP for improved pain, strength, and function.  Baseline: Goal status: INITIAL  2.  Pt will demo improved gait speed without increased pain.  Baseline:  Goal status: INITIAL  3.  Pt will report at least a 25% improvement in pain and mobility.  Baseline:  Goal status: INITIAL    LONG TERM GOALS: Target date: 01/02/2022  Pt will demo improved R LE strength to 5/5 in hip flexion, ER, and knee extension and 4+/5 to hip abduction for improved performance of functional mobility.  Baseline:  Goal status: INITIAL  2.  Pt will ambulate extended community distance without significant pain and difficulty.   Baseline:  Goal status: INITIAL  3.  Pt will be able to perform stairs without significant hip pain. Baseline:  Goal status: INITIAL    PLAN: PT FREQUENCY: 2x/week  PT DURATION: 6 weeks  PLANNED INTERVENTIONS: Therapeutic exercises, Therapeutic activity, Neuromuscular re-education, Balance training, Gait training, Patient/Family education, Stair training, DME instructions, Aquatic Therapy, Dry Needling, Electrical stimulation, Cryotherapy, Moist heat, Taping, Ultrasound, Manual therapy, and Re-evaluation  PLAN FOR NEXT SESSION: STM to ITB.  Review and perform HEP.  Progress glute/hip strengthening per pt tolerance.  Consider DN to lateral thighs?     Kristen U PT DPT PN2  12/09/2021, 8:22 AM

## 2021-12-10 ENCOUNTER — Ambulatory Visit (HOSPITAL_BASED_OUTPATIENT_CLINIC_OR_DEPARTMENT_OTHER): Payer: Medicare PPO | Admitting: Physical Therapy

## 2021-12-10 ENCOUNTER — Encounter (HOSPITAL_BASED_OUTPATIENT_CLINIC_OR_DEPARTMENT_OTHER): Payer: Self-pay | Admitting: Physical Therapy

## 2021-12-10 ENCOUNTER — Telehealth: Payer: Self-pay | Admitting: *Deleted

## 2021-12-10 DIAGNOSIS — M6281 Muscle weakness (generalized): Secondary | ICD-10-CM | POA: Diagnosis not present

## 2021-12-10 DIAGNOSIS — M5459 Other low back pain: Secondary | ICD-10-CM

## 2021-12-10 DIAGNOSIS — M25551 Pain in right hip: Secondary | ICD-10-CM

## 2021-12-10 DIAGNOSIS — R262 Difficulty in walking, not elsewhere classified: Secondary | ICD-10-CM | POA: Diagnosis not present

## 2021-12-10 NOTE — Telephone Encounter (Signed)
Received DEXA results from [facility].  Date of Scan: 12/03/2021  Lowest T-score:-2.1  BMD:0.571  Lowest site measured:Right 1/3 distal Radius   DX: Osteopenia  Significant changes in BMD and site measured (5% and above):7 % Left Total Femur, 14% AP Total Spine  Current Regimen:Reclast Last Infusion 11/06/2021, Calcium, Vitamin D  Recommendation:Improvement in Left hip and AP Spine. Continue Calcium, Vitamin D and resistive exercises. Repeat DEXA in 2 years. Will discuss drug holiday at follow up.  How many Reclast infusions has patient had?   Reviewed by:Hazel Sams, PA-C  Next Appointment: 05/13/2022

## 2021-12-11 NOTE — Telephone Encounter (Signed)
Spoke with patient and advised Improvement in Left hip and AP Spine. Continue Calcium, Vitamin D and resistive exercises. Repeat DEXA in 2 years. Will discuss drug holiday at follow up.  How many Reclast infusions has patient had? Patient states she has had 3 infusions.

## 2021-12-14 ENCOUNTER — Encounter (HOSPITAL_BASED_OUTPATIENT_CLINIC_OR_DEPARTMENT_OTHER): Payer: Self-pay | Admitting: Physical Therapy

## 2021-12-14 ENCOUNTER — Ambulatory Visit (HOSPITAL_BASED_OUTPATIENT_CLINIC_OR_DEPARTMENT_OTHER): Payer: Medicare PPO | Admitting: Physical Therapy

## 2021-12-14 DIAGNOSIS — R262 Difficulty in walking, not elsewhere classified: Secondary | ICD-10-CM

## 2021-12-14 DIAGNOSIS — M6281 Muscle weakness (generalized): Secondary | ICD-10-CM | POA: Diagnosis not present

## 2021-12-14 DIAGNOSIS — M25551 Pain in right hip: Secondary | ICD-10-CM

## 2021-12-14 DIAGNOSIS — M5459 Other low back pain: Secondary | ICD-10-CM | POA: Diagnosis not present

## 2021-12-14 NOTE — Therapy (Signed)
OUTPATIENT PHYSICAL THERAPY LOWER EXTREMITY EVALUATION   Patient Name: Felicia [name] MRN: [id number] DOB:[birth year], 81 y.o., female Today's Date: 12/14/2021   PT End of Session - 12/14/21 1328     Visit Number 5    Number of Visits 12    Date for PT Re-Evaluation 01/02/22    Authorization Type [employer] medicare    PT Start Time 1240    PT Stop Time 1321    PT Time Calculation (min) 41 min    Activity Tolerance Patient tolerated treatment well    Behavior During Therapy [facility] for tasks assessed/performed                 Past Medical History:  Diagnosis Date   Adenomatous colon polyp 2004   Colo in 2009 "no polyps"   Complication of anesthesia    small airway per patient   Echocardiogram    Echo 8/22 EF 60-65, no RWMA, mild LVH, normal RVSF, moderately elevated PASP (RVSP 50.9), trivial MR, mild AI, AV sclerosis without stenosis   GERD (gastroesophageal reflux disease)    Glaucoma    Hiatal hernia    Hypertension    Neuropathy    Nuclear stress test    Myoview 8/22: EF 65, no ischemia; low risk   Rheumatoid arthritis(714.0)    Orencia; Methotrexate (Dr. Patrecia Pour)   Sleep apnea    on CPAP   Past Surgical History:  Procedure Laterality Date   CARDIAC CATHETERIZATION N/A 03/29/2015   Procedure: Left Heart Cath and Coronary Angiography;  Surgeon: Belva Crome, MD;  Location: [facility];  Service: Cardiovascular;  Laterality: N/A;   CATARACT EXTRACTION, BILATERAL     COLONOSCOPY W/ POLYPECTOMY     X1; negative subsequently   DILATION AND CURETTAGE OF UTERUS     EYE SURGERY     due to RA   EYE SURGERY Left 06/2017   eyelid tendon    HYSTEROSCOPY WITH D & C  06/24/2012   Procedure: DILATATION AND CURETTAGE /HYSTEROSCOPY;  Surgeon: [name], MD;  Location: [facility];  Service: Gynecology;  Laterality: N/A;   SHOULDER SURGERY Left    due to RA   SQUAMOUS CELL CARCINOMA EXCISION     scalp and left leg   TRABECULECTOMY     X 2 OD; X 1 OS   UPPER  GASTROINTESTINAL ENDOSCOPY     hiatal hernia   Patient Active Problem List   Diagnosis Date Noted   Greater trochanteric pain syndrome of right lower extremity 10/23/2021   Age-related osteoporosis without current pathological fracture 06/26/2021   Aplastic anemia ([place]) 06/26/2021   Hiatal hernia 06/26/2021   Prediabetes 06/26/2021   Neuropathy 06/16/2020   SIADH (syndrome of inappropriate ADH production) ([place]) 02/18/2020   Hyponatremia 11/18/2019   Thrombocytosis 08/01/2018   URI (upper respiratory infection) 04/14/2017   Primary osteoarthritis of both knees 03/06/2017   Exertional chest pain    Pain in the chest 03/27/2015   Postural hypotension 05/26/2013   OSA (obstructive sleep apnea) 03/22/2013   Lens replaced by other means 08/25/2012   Low tension open-angle glaucoma(365.12) 02/14/2012   Osteoarthritis 11/07/2011   Abdominal bruit 10/16/2011   HTN (hypertension) 08/13/2011   Anisocoria 08/13/2011   Cervical radiculopathy 07/17/2011   DDD (degenerative disc disease), cervical 07/10/2011   Glaucoma 06/06/2011   HIATAL HERNIA 01/30/2010   POSTMENOPAUSAL SYNDROME 12/14/2009   EUSTACHIAN TUBE DYSFUNCTION, RIGHT 06/28/2009   GERD 12/07/2008   Rheumatoid arthritis ([place]) 12/07/2008   COLONIC  POLYPS, HX OF 12/07/2008   Hyperlipidemia LDL goal <70 10/17/2006   HYPERCALCEMIA 10/17/2006   Mitral valve disease 10/17/2006    PCP: Kathyrn Lass, MD  REFERRING PROVIDER: de [place], Raymond J, MD   REFERRING DIAG: [phone] (ICD-10-CM) - Greater trochanteric pain syndrome of right lower extremity   THERAPY DIAG:  Pain in right hip  Other low back pain  Muscle weakness (generalized)  Difficulty in walking, not elsewhere classified  Rationale for Evaluation and Treatment Rehabilitation  ONSET DATE: May 2023  SUBJECTIVE:   SUBJECTIVE STATEMENT: Pt reports her legs felt weak and she felt like her walk was staggering when she was walking on the sidewalk in [place].  Pt  states she has days when she walks in that manner and then other days she feels better walking.  Pt denies any adverse effects after prior Rx.  Pt denies any adverse effects after prior Rx.  Pt reports improved pain and improved tenderness in her hips.  Pt states she is feeling a little better.  Pt reports she stopped performing S/L clams due to pain lying on her sides.  She has been performing hooklying clams with theraband and feels fine with that exercises.  Pt didn't perform her exercises while she was out of town though performed the up until she left.    PERTINENT HISTORY: RA, neuropathy, osteoporosis, OA, and Hx of Lumbar radiculopathy and L LE pain  PAIN:  Are you having pain? yes NPRS:  Current:  2/10, Worst:  10/10, Best:  2/10 Location:  superior post hips and lower lumbar Type:  achey and dull   PRECAUTIONS: Other: Osteoporosis, RA, and hx of lumbar radiculopathy and L LE pain; lumbar anterolisthesis  WEIGHT BEARING RESTRICTIONS No  FALLS:  Has patient fallen in last 6 months? No   OCCUPATION: Pt is retired  PLOF: Independent  PATIENT GOALS to improve strength in legs, move better, and reduce pain    OBJECTIVE:   DIAGNOSTIC FINDINGS: Lumbar MRI on 07/19/2020: IMPRESSION: 1. Multilevel disc and facet degeneration with levoscoliosis and L3-4, L4-5 anterolisthesis. 2. L4-5 left paracentral extrusion with superior migration and impingement on the left L4 and L5 nerve roots. No other neural impingement, suspect this is the symptomatic finding. 3. Active facet arthritis on the left at L4-5.   TODAY'S TREATMENT 12/07/21   Therapeutic Exercise: Nustep L3 x 5 minutes BLEs only  Bridges 2x10  PPT 1x20  Supine clams red TB 1x15 and GTB x 10 reps PPT with march 2x10  Supine SLR x10-12 reps bilat Standing hip abd 2x10 LAQ with RTB 2x10   Manual Therapy: Pt received STM and rolling to R glute, lateral hip and ITB  in L S/L'ing with pillow between knees to improve  soft tissue tightness, mobility, and pain.    Marland Kitchen  PATIENT EDUCATION:  Education details: Reviewed HEP and updated HEP.  Gave pt a HEP handout and educated pt in correct form and appropriate frequency.  relevant anatomy, exercise form, exercise rationale, POC   Person educated: Patient Education method: Explanation, Demonstration, Tactile cues, Verbal cues, and Handouts Education comprehension: verbalized understanding, returned demonstration, verbal cues required, tactile cues required, and needs further education   HOME EXERCISE PROGRAM: Access Code: [email or web address] URL: [email or web address] Date: 11/21/2021 Prepared by: Ronny Flurry   And self massage to ITB Updated HEP. - Hooklying Clamshell with Resistance  - 1 x daily - 3-4 x weekly - 2 sets - 10 reps - Seated Knee Extension with Resistance  - 1 x  daily - 3 x weekly - 2-3 sets - 10 reps  ASSESSMENT:  CLINICAL IMPRESSION: Pt continues to report difficulty with walking and weakness in legs.  Pt is improving with pain as evidenced by subjective reports.  Pt requires verbal, visual, and tactile cuing for correct form with exercises.  Pt has tightness in R ITB and is tender with palpation of R glute, hip, and ITB. Pt has improved tolerance with STW.  PT reviewed and updated HEP.   Pt responded well to Rx stating  "I feel much better" after Rx.   She should benefit from cont skilled PT services to address impairments and to improve overall function.     OBJECTIVE IMPAIRMENTS Abnormal gait, decreased activity tolerance, decreased balance, decreased endurance, decreased mobility, difficulty walking, decreased strength, and pain.   ACTIVITY LIMITATIONS stairs and locomotion level  PARTICIPATION LIMITATIONS: community activity  PERSONAL FACTORS 3+ comorbidities: RA, neuropathy, osteoporosis, OA, and Hx of Lumbar radiculopathy and L LE pain  are also affecting patient's functional outcome.   REHAB POTENTIAL: Good  CLINICAL  DECISION MAKING: Stable/uncomplicated  EVALUATION COMPLEXITY: Low   GOALS:   SHORT TERM GOALS: Target date:12/12/2021 Pt will be independent and compliant with HEP for improved pain, strength, and function.  Baseline: Goal status: INITIAL  2.  Pt will demo improved gait speed without increased pain.  Baseline:  Goal status: INITIAL  3.  Pt will report at least a 25% improvement in pain and mobility.  Baseline:  Goal status: INITIAL    LONG TERM GOALS: Target date: 01/02/2022  Pt will demo improved R LE strength to 5/5 in hip flexion, ER, and knee extension and 4+/5 to hip abduction for improved performance of functional mobility.  Baseline:  Goal status: INITIAL  2.  Pt will ambulate extended community distance without significant pain and difficulty.   Baseline:  Goal status: INITIAL  3.  Pt will be able to perform stairs without significant hip pain. Baseline:  Goal status: INITIAL    PLAN: PT FREQUENCY: 2x/week  PT DURATION: 6 weeks  PLANNED INTERVENTIONS: Therapeutic exercises, Therapeutic activity, Neuromuscular re-education, Balance training, Gait training, Patient/Family education, Stair training, DME instructions, Aquatic Therapy, Dry Needling, Electrical stimulation, Cryotherapy, Moist heat, Taping, Ultrasound, Manual therapy, and Re-evaluation  PLAN FOR NEXT SESSION: STM to ITB and glute.  Progress glute/hip strengthening per pt tolerance.    Selinda Michaels III PT, DPT 12/14/21 3:32 PM

## 2021-12-18 ENCOUNTER — Encounter (HOSPITAL_BASED_OUTPATIENT_CLINIC_OR_DEPARTMENT_OTHER): Payer: Self-pay | Admitting: Family Medicine

## 2021-12-18 ENCOUNTER — Ambulatory Visit (INDEPENDENT_AMBULATORY_CARE_PROVIDER_SITE_OTHER): Payer: Medicare PPO | Admitting: Family Medicine

## 2021-12-18 ENCOUNTER — Ambulatory Visit (HOSPITAL_BASED_OUTPATIENT_CLINIC_OR_DEPARTMENT_OTHER): Payer: Medicare PPO | Admitting: Physical Therapy

## 2021-12-18 ENCOUNTER — Encounter (HOSPITAL_BASED_OUTPATIENT_CLINIC_OR_DEPARTMENT_OTHER): Payer: Self-pay | Admitting: Physical Therapy

## 2021-12-18 VITALS — BP 151/70 | HR 60 | Ht 65.0 in | Wt 135.0 lb

## 2021-12-18 DIAGNOSIS — R262 Difficulty in walking, not elsewhere classified: Secondary | ICD-10-CM

## 2021-12-18 DIAGNOSIS — M5459 Other low back pain: Secondary | ICD-10-CM

## 2021-12-18 DIAGNOSIS — M25551 Pain in right hip: Secondary | ICD-10-CM

## 2021-12-18 DIAGNOSIS — M6281 Muscle weakness (generalized): Secondary | ICD-10-CM

## 2021-12-18 NOTE — Assessment & Plan Note (Signed)
Patient generally has been doing well since last visit with me.  She feels that stair injection did provide notable relief.  She has also been working with physical therapy which she has found to be beneficial.  She has also been doing home exercises.  Thinks that she has about 2 more sessions with PT upcoming and plans to look to transition to utilizing more exercise machines in the gym downstairs and looks to have some guidance from physical therapy regarding this.  She will also be continuing with more home exercises.  Generally, she feels that symptoms are at least 60% improved from when we saw each other. Given the improvement that she has had thus far, can continue with conservative measures, home exercises.  Would continue with remaining sessions with physical therapy, looking to have guidance regarding which exercise machines to utilize downstairs Discussed that given her positive response, could consider repeat steroid injection in the future if pain does return.  Could also consider reevaluation with physical therapy if this happens as well Plan for follow-up as needed

## 2021-12-18 NOTE — Progress Notes (Signed)
    Procedures performed today:    None.  Independent interpretation of notes and tests performed by another provider:   None.  Brief History, Exam, Impression, and Recommendations:    BP (!) 151/70   Pulse 60   Ht '5\' 5"'$  (1.651 m)   Wt 135 lb (61.2 kg)   SpO2 97%   BMI 22.47 kg/m   Greater trochanteric pain syndrome of right lower extremity Patient generally has been doing well since last visit with me.  She feels that stair injection did provide notable relief.  She has also been working with physical therapy which she has found to be beneficial.  She has also been doing home exercises.  Thinks that she has about 2 more sessions with PT upcoming and plans to look to transition to utilizing more exercise machines in the gym downstairs and looks to have some guidance from physical therapy regarding this.  She will also be continuing with more home exercises.  Generally, she feels that symptoms are at least 60% improved from when we saw each other. Given the improvement that she has had thus far, can continue with conservative measures, home exercises.  Would continue with remaining sessions with physical therapy, looking to have guidance regarding which exercise machines to utilize downstairs Discussed that given her positive response, could consider repeat steroid injection in the future if pain does return.  Could also consider reevaluation with physical therapy if this happens as well Plan for follow-up as needed  Return if symptoms worsen or fail to improve.   ___________________________________________ Bulah Lurie de [place], MD, ABFM, [facility] and [place]

## 2021-12-18 NOTE — Therapy (Addendum)
OUTPATIENT PHYSICAL THERAPY LOWER EXTREMITY EVALUATION   Patient Name: Felicia [name] MRN: [id number] DOB:03/11/1941, 81 y.o., female Today's Date: 12/18/2021   Visit Number:                       6 Number of visits:                  12 Date for PT Re-Evaluation    01/02/2022 Authorization Type:               [employer] PT start time:                         7209 PT End time:                          4709 PT Time Calculation:              45 min Activity Tolerance:                  pt tolerated treatment well Behavior During Therapy:      [facility] for tasks assessed/performed         Past Medical History:  Diagnosis Date   Adenomatous colon polyp 2004   Colo in 2009 "no polyps"   Complication of anesthesia    small airway per patient   Echocardiogram    Echo 8/22 EF 60-65, no RWMA, mild LVH, normal RVSF, moderately elevated PASP (RVSP 50.9), trivial MR, mild AI, AV sclerosis without stenosis   GERD (gastroesophageal reflux disease)    Glaucoma    Hiatal hernia    Hypertension    Neuropathy    Nuclear stress test    Myoview 8/22: EF 65, no ischemia; low risk   Rheumatoid arthritis(714.0)    Orencia; Methotrexate (Dr. Patrecia Pour)   Sleep apnea    on CPAP   Past Surgical History:  Procedure Laterality Date   CARDIAC CATHETERIZATION N/A 03/29/2015   Procedure: Left Heart Cath and Coronary Angiography;  Surgeon: Belva Crome, MD;  Location: [place] [facility];  Service: Cardiovascular;  Laterality: N/A;   CATARACT EXTRACTION, BILATERAL     COLONOSCOPY W/ POLYPECTOMY     X1; negative subsequently   DILATION AND CURETTAGE OF UTERUS     EYE SURGERY     due to RA   EYE SURGERY Left 06/2017   eyelid tendon    HYSTEROSCOPY WITH D & C  06/24/2012   Procedure: DILATATION AND CURETTAGE /HYSTEROSCOPY;  Surgeon: [name], MD;  Location: [place] [facility];  Service: Gynecology;  Laterality: N/A;   SHOULDER SURGERY Left    due to RA   SQUAMOUS CELL CARCINOMA EXCISION     scalp  and left leg   TRABECULECTOMY     X 2 OD; X 1 OS   UPPER GASTROINTESTINAL ENDOSCOPY     hiatal hernia   Patient Active Problem List   Diagnosis Date Noted   Greater trochanteric pain syndrome of right lower extremity 10/23/2021   Age-related osteoporosis without current pathological fracture 06/26/2021   Aplastic anemia ([place]) 06/26/2021   Hiatal hernia 06/26/2021   Prediabetes 06/26/2021   Neuropathy 06/16/2020   SIADH (syndrome of inappropriate ADH production) ([place]) 02/18/2020   Hyponatremia 11/18/2019   Thrombocytosis 08/01/2018   URI (upper respiratory infection) 04/14/2017   Primary osteoarthritis of both knees 03/06/2017   Exertional chest pain    Pain  in the chest 03/27/2015   Postural hypotension 05/26/2013   OSA (obstructive sleep apnea) 03/22/2013   Lens replaced by other means 08/25/2012   Low tension open-angle glaucoma(365.12) 02/14/2012   Osteoarthritis 11/07/2011   Abdominal bruit 10/16/2011   HTN (hypertension) 08/13/2011   Anisocoria 08/13/2011   Cervical radiculopathy 07/17/2011   DDD (degenerative disc disease), cervical 07/10/2011   Glaucoma 06/06/2011   HIATAL HERNIA 01/30/2010   POSTMENOPAUSAL SYNDROME 12/14/2009   EUSTACHIAN TUBE DYSFUNCTION, RIGHT 06/28/2009   GERD 12/07/2008   Rheumatoid arthritis ([place]) 12/07/2008   COLONIC POLYPS, HX OF 12/07/2008   Hyperlipidemia LDL goal <70 10/17/2006   HYPERCALCEMIA 10/17/2006   Mitral valve disease 10/17/2006    PCP: Kathyrn Lass, MD  REFERRING PROVIDER: de [place], Raymond J, MD   REFERRING DIAG: [phone] (ICD-10-CM) - Greater trochanteric pain syndrome of right lower extremity   THERAPY DIAG:  Pain in right hip  Other low back pain  Muscle weakness (generalized)  Difficulty in walking, not elsewhere classified  Rationale for Evaluation and Treatment Rehabilitation  ONSET DATE: May 2023  SUBJECTIVE:   SUBJECTIVE STATEMENT:   Pt states she felt great after prior Rx and felt stronger.  Pt  states she walked better to her car after Rx.  She took a nap after prior Rx and reports she could tell she worked out when she woke up.  Pt may not be able to come to PT in August due to having a busy schedule which includes MD appt's and possible eye surgery.  Pt states she's not having a good day today.  She feels weak today and is not sure if it's due to her RA.  Pt wants to know some exercises in the gym to strengthen her LE's.  Pt reports compliance with HEP.  Pt saw MD this AM and she reports he is pleased with progress.  Pt states she is pleased with progress.  Pt sees MD on Thursday to determine if she needs eye surgery.    PERTINENT HISTORY: RA, neuropathy, osteoporosis, OA, and Hx of Lumbar radiculopathy and L LE pain  PAIN:  Are you having pain? yes NPRS:  Current:  2-3/10 bilat hips and 4-5 lumbar Location:  hips and lumbar Type:  achey and dull   PRECAUTIONS: Other: Osteoporosis, RA, and hx of lumbar radiculopathy and L LE pain; lumbar anterolisthesis  WEIGHT BEARING RESTRICTIONS No  FALLS:  Has patient fallen in last 6 months? No   OCCUPATION: Pt is retired  PLOF: Independent  PATIENT GOALS to improve strength in legs, move better, and reduce pain    OBJECTIVE:   DIAGNOSTIC FINDINGS: Lumbar MRI on 07/19/2020: IMPRESSION: 1. Multilevel disc and facet degeneration with levoscoliosis and L3-4, L4-5 anterolisthesis. 2. L4-5 left paracentral extrusion with superior migration and impingement on the left L4 and L5 nerve roots. No other neural impingement, suspect this is the symptomatic finding. 3. Active facet arthritis on the left at L4-5.   TODAY'S TREATMENT   Therapeutic Exercise: Reviewed current function, pain level, and response to prior Rx.  Pt performed: Nustep L3 x 6 minutes BLEs only  Bridges 2x10  Cybex leg press 30# 2x10-12 reps Standing hip abd 2x10 LAQ 2x10 bilat with 3# Sidestepping with light UE support at rail x 3 laps   Manual Therapy: Pt  received STM and rolling to R glute, lateral hip and ITB  in L S/L'ing with pillow between knees to improve soft tissue tightness, mobility, and pain.    Marland Kitchen  PATIENT  EDUCATION:  Education details:  HEP, relevant anatomy, exercise form, exercise rationale, and POC.  PT answered pt's questions.   Person educated: Patient Education method: Explanation, Demonstration, Tactile cues, Verbal cues, and Handouts Education comprehension: verbalized understanding, returned demonstration, verbal cues required, tactile cues required, and needs further education   HOME EXERCISE PROGRAM: Access Code: [email or web address] URL: [email or web address] Date: 11/21/2021 Prepared by: Ronny Flurry   And self massage to ITB Updated HEP. - Hooklying Clamshell with Resistance  - 1 x daily - 3-4 x weekly - 2 sets - 10 reps - Seated Knee Extension with Resistance  - 1 x daily - 3 x weekly - 2-3 sets - 10 reps  ASSESSMENT:  CLINICAL IMPRESSION: Pt reports she is getting stronger though feels weak today.  Pt not sure if it's due to RA.  PT progressed exercises today and Pt performed exercises well with cuing and instruction in correct form.  Pt stated  "I'm already walking better" after performing some exercises today.   Pt has tightness in R ITB and is tender with palpation of R glute, hip, and ITB. Pt has improved tolerance with STW.  Pt responded well to Rx stating she feels much better after Rx.   She should benefit from cont skilled PT services to address impairments and to improve overall function.     OBJECTIVE IMPAIRMENTS Abnormal gait, decreased activity tolerance, decreased balance, decreased endurance, decreased mobility, difficulty walking, decreased strength, and pain.   ACTIVITY LIMITATIONS stairs and locomotion level  PARTICIPATION LIMITATIONS: community activity  PERSONAL FACTORS 3+ comorbidities: RA, neuropathy, osteoporosis, OA, and Hx of Lumbar radiculopathy and L LE pain  are also  affecting patient's functional outcome.   REHAB POTENTIAL: Good  CLINICAL DECISION MAKING: Stable/uncomplicated  EVALUATION COMPLEXITY: Low   GOALS:   SHORT TERM GOALS: Target date:12/12/2021 Pt will be independent and compliant with HEP for improved pain, strength, and function.  Baseline: Goal status: INITIAL  2.  Pt will demo improved gait speed without increased pain.  Baseline:  Goal status: INITIAL  3.  Pt will report at least a 25% improvement in pain and mobility.  Baseline:  Goal status: INITIAL    LONG TERM GOALS: Target date: 01/02/2022  Pt will demo improved R LE strength to 5/5 in hip flexion, ER, and knee extension and 4+/5 to hip abduction for improved performance of functional mobility.  Baseline:  Goal status: INITIAL  2.  Pt will ambulate extended community distance without significant pain and difficulty.   Baseline:  Goal status: INITIAL  3.  Pt will be able to perform stairs without significant hip pain. Baseline:  Goal status: INITIAL    PLAN: PT FREQUENCY: 2x/week  PT DURATION: 6 weeks  PLANNED INTERVENTIONS: Therapeutic exercises, Therapeutic activity, Neuromuscular re-education, Balance training, Gait training, Patient/Family education, Stair training, DME instructions, Aquatic Therapy, Dry Needling, Electrical stimulation, Cryotherapy, Moist heat, Taping, Ultrasound, Manual therapy, and Re-evaluation  PLAN FOR NEXT SESSION: STM to ITB and glute.  Progress glute/hip strengthening per pt tolerance.  Assess response to Rx and perform leg press again next visit if tolerable.     Selinda Michaels III PT, DPT 12/20/21 11:45 PM

## 2021-12-20 DIAGNOSIS — H401233 Low-tension glaucoma, bilateral, severe stage: Secondary | ICD-10-CM | POA: Diagnosis not present

## 2021-12-20 NOTE — Therapy (Signed)
OUTPATIENT PHYSICAL THERAPY LOWER EXTREMITY EVALUATION   Patient Name: PEARLY BARTOSIK MRN: [id number] DOB:10-08-1940, 81 y.o., female Today's Date: 12/21/2021   PT End of Session - 12/21/21 1057     Visit Number 7    Number of Visits 12    Date for PT Re-Evaluation 01/02/22    Authorization Type [employer] medicare    PT Start Time 1025    PT Stop Time 1105    PT Time Calculation (min) 40 min    Activity Tolerance Patient tolerated treatment well    Behavior During Therapy [facility] for tasks assessed/performed                  Past Medical History:  Diagnosis Date   Adenomatous colon polyp 2004   Colo in 2009 "no polyps"   Complication of anesthesia    small airway per patient   Echocardiogram    Echo 8/22 EF 60-65, no RWMA, mild LVH, normal RVSF, moderately elevated PASP (RVSP 50.9), trivial MR, mild AI, AV sclerosis without stenosis   GERD (gastroesophageal reflux disease)    Glaucoma    Hiatal hernia    Hypertension    Neuropathy    Nuclear stress test    Myoview 8/22: EF 65, no ischemia; low risk   Rheumatoid arthritis(714.0)    Orencia; Methotrexate (Dr. Patrecia Pour)   Sleep apnea    on CPAP   Past Surgical History:  Procedure Laterality Date   CARDIAC CATHETERIZATION N/A 03/29/2015   Procedure: Left Heart Cath and Coronary Angiography;  Surgeon: Belva Crome, MD;  Location: [place] [facility];  Service: Cardiovascular;  Laterality: N/A;   CATARACT EXTRACTION, BILATERAL     COLONOSCOPY W/ POLYPECTOMY     X1; negative subsequently   DILATION AND CURETTAGE OF UTERUS     EYE SURGERY     due to RA   EYE SURGERY Left 06/2017   eyelid tendon    HYSTEROSCOPY WITH D & C  06/24/2012   Procedure: DILATATION AND CURETTAGE /HYSTEROSCOPY;  Surgeon: [name], MD;  Location: [place] [facility];  Service: Gynecology;  Laterality: N/A;   SHOULDER SURGERY Left    due to RA   SQUAMOUS CELL CARCINOMA EXCISION     scalp and left leg   TRABECULECTOMY     X 2 OD; X 1 OS   UPPER  GASTROINTESTINAL ENDOSCOPY     hiatal hernia   Patient Active Problem List   Diagnosis Date Noted   Greater trochanteric pain syndrome of right lower extremity 10/23/2021   Age-related osteoporosis without current pathological fracture 06/26/2021   Aplastic anemia ([place]) 06/26/2021   Hiatal hernia 06/26/2021   Prediabetes 06/26/2021   Neuropathy 06/16/2020   SIADH (syndrome of inappropriate ADH production) ([place]) 02/18/2020   Hyponatremia 11/18/2019   Thrombocytosis 08/01/2018   URI (upper respiratory infection) 04/14/2017   Primary osteoarthritis of both knees 03/06/2017   Exertional chest pain    Pain in the chest 03/27/2015   Postural hypotension 05/26/2013   OSA (obstructive sleep apnea) 03/22/2013   Lens replaced by other means 08/25/2012   Low tension open-angle glaucoma(365.12) 02/14/2012   Osteoarthritis 11/07/2011   Abdominal bruit 10/16/2011   HTN (hypertension) 08/13/2011   Anisocoria 08/13/2011   Cervical radiculopathy 07/17/2011   DDD (degenerative disc disease), cervical 07/10/2011   Glaucoma 06/06/2011   HIATAL HERNIA 01/30/2010   POSTMENOPAUSAL SYNDROME 12/14/2009   EUSTACHIAN TUBE DYSFUNCTION, RIGHT 06/28/2009   GERD 12/07/2008   Rheumatoid arthritis ([place]) 12/07/2008  COLONIC POLYPS, HX OF 12/07/2008   Hyperlipidemia LDL goal <70 10/17/2006   HYPERCALCEMIA 10/17/2006   Mitral valve disease 10/17/2006    PCP: Kathyrn Lass, MD  REFERRING PROVIDER: de [place], Raymond J, MD   REFERRING DIAG: [phone] (ICD-10-CM) - Greater trochanteric pain syndrome of right lower extremity   THERAPY DIAG:  Pain in right hip  Other low back pain  Muscle weakness (generalized)  Difficulty in walking, not elsewhere classified  Rationale for Evaluation and Treatment Rehabilitation  ONSET DATE: May 2023  SUBJECTIVE:   SUBJECTIVE STATEMENT:  Pt states she felt great after prior Rx and felt stronger.  Pt states she feels stronger when she walks to the parking lot after  therapy.  Pt reports compliance with HEP.   Pt reports improved walking.  Pt states her legs feel better.  Pt has severe glaucoma.  She saw MD and he is not pleased with her current eye status.  Pt states MD doesn't want to perform eye surgery though has changed her treatment including new eye drops.        PERTINENT HISTORY: RA, neuropathy, osteoporosis, OA, and Hx of Lumbar radiculopathy and L LE pain  PAIN:  Are you having pain? yes NPRS:  Current:  2/10 lumbar and 0/10 hip Location:  hips and lumbar Type:  achey and dull   PRECAUTIONS: Other: Osteoporosis, RA, and hx of lumbar radiculopathy and L LE pain; lumbar anterolisthesis  WEIGHT BEARING RESTRICTIONS No  FALLS:  Has patient fallen in last 6 months? No   OCCUPATION: Pt is retired  PLOF: Independent  PATIENT GOALS to improve strength in legs, move better, and reduce pain    OBJECTIVE:   DIAGNOSTIC FINDINGS: Lumbar MRI on 07/19/2020: IMPRESSION: 1. Multilevel disc and facet degeneration with levoscoliosis and L3-4, L4-5 anterolisthesis. 2. L4-5 left paracentral extrusion with superior migration and impingement on the left L4 and L5 nerve roots. No other neural impingement, suspect this is the symptomatic finding. 3. Active facet arthritis on the left at L4-5.   TODAY'S TREATMENT   Therapeutic Exercise: Reviewed current function, pain level, and response to prior Rx.  Pt performed: Nustep L3 x 6 minutes BLEs only  Bridges 2x10  Cybex leg press 40# 3x10 reps Standing hip abd 2x10 LAQ 2x10 bilat with 3# Sidestepping with light UE support at rail x 3 laps   Manual Therapy: Pt brought in a fidget roller that she purchased.  PT instructed pt in using her new fidget roller and pt used in the clinic.  Pt received STM and rolling to R glute, lateral hip and ITB  in L S/L'ing with pillow between knees to improve soft tissue tightness, mobility, and pain.    Marland Kitchen  PATIENT EDUCATION:  Education details:  HEP,  relevant anatomy, exercise form, exercise rationale, and POC.  PT answered pt's questions.   Person educated: Patient Education method: Explanation, Demonstration, Tactile cues, Verbal cues, and Handouts Education comprehension: verbalized understanding, returned demonstration, verbal cues required, tactile cues required, and needs further education   HOME EXERCISE PROGRAM: Access Code: [email or web address] URL: [email or web address] Date: 11/21/2021 Prepared by: Ronny Flurry   And self massage to ITB Updated HEP. - Hooklying Clamshell with Resistance  - 1 x daily - 3-4 x weekly - 2 sets - 10 reps - Seated Knee Extension with Resistance  - 1 x daily - 3 x weekly - 2-3 sets - 10 reps  ASSESSMENT:  CLINICAL IMPRESSION: Pt reports improved walking and her legs feel better.  Pt  purchased a small roller to roll her ITB and PT instructed pt in using it.  Pt has weakness in LE though is improving with strength.  She performed exercises well with cuing for correct form and positioning.  Pt responded well to Rx having stating her back felt better and having no hip pain after Rx.   She should benefit from cont skilled PT services to address impairments and to improve overall function.     OBJECTIVE IMPAIRMENTS Abnormal gait, decreased activity tolerance, decreased balance, decreased endurance, decreased mobility, difficulty walking, decreased strength, and pain.   ACTIVITY LIMITATIONS stairs and locomotion level  PARTICIPATION LIMITATIONS: community activity  PERSONAL FACTORS 3+ comorbidities: RA, neuropathy, osteoporosis, OA, and Hx of Lumbar radiculopathy and L LE pain  are also affecting patient's functional outcome.   REHAB POTENTIAL: Good  CLINICAL DECISION MAKING: Stable/uncomplicated  EVALUATION COMPLEXITY: Low   GOALS:   SHORT TERM GOALS: Target date:12/12/2021 Pt will be independent and compliant with HEP for improved pain, strength, and function.  Baseline: Goal status:  INITIAL  2.  Pt will demo improved gait speed without increased pain.  Baseline:  Goal status: INITIAL  3.  Pt will report at least a 25% improvement in pain and mobility.  Baseline:  Goal status: INITIAL    LONG TERM GOALS: Target date: 01/02/2022  Pt will demo improved R LE strength to 5/5 in hip flexion, ER, and knee extension and 4+/5 to hip abduction for improved performance of functional mobility.  Baseline:  Goal status: INITIAL  2.  Pt will ambulate extended community distance without significant pain and difficulty.   Baseline:  Goal status: INITIAL  3.  Pt will be able to perform stairs without significant hip pain. Baseline:  Goal status: INITIAL    PLAN: PT FREQUENCY: 2x/week  PT DURATION: 6 weeks  PLANNED INTERVENTIONS: Therapeutic exercises, Therapeutic activity, Neuromuscular re-education, Balance training, Gait training, Patient/Family education, Stair training, DME instructions, Aquatic Therapy, Dry Needling, Electrical stimulation, Cryotherapy, Moist heat, Taping, Ultrasound, Manual therapy, and Re-evaluation  PLAN FOR NEXT SESSION: STM to ITB and glute.  Progress glute/hip strengthening per pt tolerance.  Assess response to Rx and perform leg press again next visit if tolerable.     Selinda Michaels III PT, DPT 12/21/21 12:27 PM

## 2021-12-21 ENCOUNTER — Encounter (HOSPITAL_BASED_OUTPATIENT_CLINIC_OR_DEPARTMENT_OTHER): Payer: Self-pay | Admitting: Physical Therapy

## 2021-12-21 ENCOUNTER — Ambulatory Visit (HOSPITAL_BASED_OUTPATIENT_CLINIC_OR_DEPARTMENT_OTHER): Payer: Medicare PPO | Admitting: Physical Therapy

## 2021-12-21 DIAGNOSIS — R262 Difficulty in walking, not elsewhere classified: Secondary | ICD-10-CM | POA: Diagnosis not present

## 2021-12-21 DIAGNOSIS — M25551 Pain in right hip: Secondary | ICD-10-CM | POA: Diagnosis not present

## 2021-12-21 DIAGNOSIS — M5459 Other low back pain: Secondary | ICD-10-CM | POA: Diagnosis not present

## 2021-12-21 DIAGNOSIS — M6281 Muscle weakness (generalized): Secondary | ICD-10-CM | POA: Diagnosis not present

## 2021-12-24 ENCOUNTER — Ambulatory Visit (HOSPITAL_BASED_OUTPATIENT_CLINIC_OR_DEPARTMENT_OTHER): Payer: Medicare PPO | Admitting: Physical Therapy

## 2021-12-24 ENCOUNTER — Encounter (HOSPITAL_BASED_OUTPATIENT_CLINIC_OR_DEPARTMENT_OTHER): Payer: Self-pay | Admitting: Physical Therapy

## 2021-12-24 DIAGNOSIS — M5459 Other low back pain: Secondary | ICD-10-CM

## 2021-12-24 DIAGNOSIS — R262 Difficulty in walking, not elsewhere classified: Secondary | ICD-10-CM

## 2021-12-24 DIAGNOSIS — M25551 Pain in right hip: Secondary | ICD-10-CM | POA: Diagnosis not present

## 2021-12-24 DIAGNOSIS — M6281 Muscle weakness (generalized): Secondary | ICD-10-CM
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
## 2021-12-24 NOTE — Therapy (Signed)
OUTPATIENT PHYSICAL THERAPY LOWER EXTREMITY EVALUATION   Patient Name: BRAELYNN BENNING MRN: [id number] DOB:[birth year], 81 y.o., female Today's Date: 12/24/2021   PT End of Session - 12/24/21 1616     Visit Number 8    Number of Visits 12    Date for PT Re-Evaluation 01/02/22    Authorization Type [employer] medicare    PT Start Time 1520    PT Stop Time 1602    PT Time Calculation (min) 42 min    Activity Tolerance Patient tolerated treatment well    Behavior During Therapy [facility] for tasks assessed/performed                   Past Medical History:  Diagnosis Date   Adenomatous colon polyp 2004   Colo in 2009 "no polyps"   Complication of anesthesia    small airway per patient   Echocardiogram    Echo 8/22 EF 60-65, no RWMA, mild LVH, normal RVSF, moderately elevated PASP (RVSP 50.9), trivial MR, mild AI, AV sclerosis without stenosis   GERD (gastroesophageal reflux disease)    Glaucoma    Hiatal hernia    Hypertension    Neuropathy    Nuclear stress test    Myoview 8/22: EF 65, no ischemia; low risk   Rheumatoid arthritis(714.0)    Orencia; Methotrexate (Dr. Patrecia Pour)   Sleep apnea    on CPAP   Past Surgical History:  Procedure Laterality Date   CARDIAC CATHETERIZATION N/A 03/29/2015   Procedure: Left Heart Cath and Coronary Angiography;  Surgeon: Belva Crome, MD;  Location: [facility];  Service: Cardiovascular;  Laterality: N/A;   CATARACT EXTRACTION, BILATERAL     COLONOSCOPY W/ POLYPECTOMY     X1; negative subsequently   DILATION AND CURETTAGE OF UTERUS     EYE SURGERY     due to RA   EYE SURGERY Left 06/2017   eyelid tendon    HYSTEROSCOPY WITH D & C  06/24/2012   Procedure: DILATATION AND CURETTAGE /HYSTEROSCOPY;  Surgeon: [name], MD;  Location: [facility];  Service: Gynecology;  Laterality: N/A;   SHOULDER SURGERY Left    due to RA   SQUAMOUS CELL CARCINOMA EXCISION     scalp and left leg   TRABECULECTOMY     X 2 OD; X 1 OS    UPPER GASTROINTESTINAL ENDOSCOPY     hiatal hernia   Patient Active Problem List   Diagnosis Date Noted   Greater trochanteric pain syndrome of right lower extremity 10/23/2021   Age-related osteoporosis without current pathological fracture 06/26/2021   Aplastic anemia ([place]) 06/26/2021   Hiatal hernia 06/26/2021   Prediabetes 06/26/2021   Neuropathy 06/16/2020   SIADH (syndrome of inappropriate ADH production) ([place]) 02/18/2020   Hyponatremia 11/18/2019   Thrombocytosis 08/01/2018   URI (upper respiratory infection) 04/14/2017   Primary osteoarthritis of both knees 03/06/2017   Exertional chest pain    Pain in the chest 03/27/2015   Postural hypotension 05/26/2013   OSA (obstructive sleep apnea) 03/22/2013   Lens replaced by other means 08/25/2012   Low tension open-angle glaucoma(365.12) 02/14/2012   Osteoarthritis 11/07/2011   Abdominal bruit 10/16/2011   HTN (hypertension) 08/13/2011   Anisocoria 08/13/2011   Cervical radiculopathy 07/17/2011   DDD (degenerative disc disease), cervical 07/10/2011   Glaucoma 06/06/2011   HIATAL HERNIA 01/30/2010   POSTMENOPAUSAL SYNDROME 12/14/2009   EUSTACHIAN TUBE DYSFUNCTION, RIGHT 06/28/2009   GERD 12/07/2008   Rheumatoid arthritis ([place]) 12/07/2008  COLONIC POLYPS, HX OF 12/07/2008   Hyperlipidemia LDL goal <70 10/17/2006   HYPERCALCEMIA 10/17/2006   Mitral valve disease 10/17/2006    PCP: Kathyrn Lass, MD  REFERRING PROVIDER: de [place], Raymond J, MD   REFERRING DIAG: [phone] (ICD-10-CM) - Greater trochanteric pain syndrome of right lower extremity   THERAPY DIAG:  Pain in right hip  Other low back pain  Muscle weakness (generalized)  Difficulty in walking, not elsewhere classified  Rationale for Evaluation and Treatment Rehabilitation  ONSET DATE: May 2023  SUBJECTIVE:   SUBJECTIVE STATEMENT: Pt denies any adverse effects after prior Rx.  Pt states she used the roller on her ITB at home.  Pt states she feels  stronger when she walks to the parking lot after therapy.  Pt reports compliance with HEP.   Pt reports improved walking.  Pt states she is excited about how much stronger her legs are.  Pt has severe glaucoma.  She saw MD and he is not pleased with her current eye status.  Pt states MD doesn't want to perform eye surgery though has changed her treatment including new eye drops.        PERTINENT HISTORY: RA, neuropathy, osteoporosis, OA, and Hx of Lumbar radiculopathy and L LE pain  PAIN:  Are you having pain? yes NPRS:  Current:  2/10 lumbar and 0/10 hip Location:  hips and lumbar Type:  achey and dull   PRECAUTIONS: Other: Osteoporosis, RA, and hx of lumbar radiculopathy and L LE pain; lumbar anterolisthesis  WEIGHT BEARING RESTRICTIONS No  FALLS:  Has patient fallen in last 6 months? No   OCCUPATION: Pt is retired  PLOF: Independent  PATIENT GOALS to improve strength in legs, move better, and reduce pain    OBJECTIVE:   DIAGNOSTIC FINDINGS: Lumbar MRI on 07/19/2020: IMPRESSION: 1. Multilevel disc and facet degeneration with levoscoliosis and L3-4, L4-5 anterolisthesis. 2. L4-5 left paracentral extrusion with superior migration and impingement on the left L4 and L5 nerve roots. No other neural impingement, suspect this is the symptomatic finding. 3. Active facet arthritis on the left at L4-5.   TODAY'S TREATMENT   Therapeutic Exercise: Reviewed current function, pain level, and response to prior Rx.  Pt performed: Nustep L3 x 6 minutes BLEs only  Bridges 2x10  [employer] leg press 15# x 10 reps, 10# 2x10 reps Standing hip abd 2x10 LAQ 3x15 bilat with 3# Sidestepping with light UE support at rail x 3 laps Step ups on 4 inch step without UE support 2x10 reps   Manual Therapy: Pt received STM and rolling to R glute, lateral hip and ITB  in L S/L'ing with pillow between knees to improve soft tissue tightness, mobility, and pain.    Marland Kitchen  PATIENT EDUCATION:   Education details:  HEP, relevant anatomy, exercise form, exercise rationale, and POC.  PT answered pt's questions.   Person educated: Patient Education method: Explanation, Demonstration, Tactile cues, Verbal cues, and Handouts Education comprehension: verbalized understanding, returned demonstration, verbal cues required, tactile cues required, and needs further education   HOME EXERCISE PROGRAM: Access Code: [email or web address] URL: [email or web address] Date: 11/21/2021 Prepared by: Ronny Flurry   And self massage to ITB Updated HEP. - Hooklying Clamshell with Resistance  - 1 x daily - 3-4 x weekly - 2 sets - 10 reps - Seated Knee Extension with Resistance  - 1 x daily - 3 x weekly - 2-3 sets - 10 reps  ASSESSMENT:  CLINICAL IMPRESSION: Pt is pleased with her progress and  reports improved strength in bilat LE's.  She performed exercises well with cuing for correct form and positioning.  PT is progressing exercises and she is slowly progressing with LE strength.  She gives good effort with all exercises.  Pt responded well to Rx reporting improved pain from 4/10 before Rx to 1/10 after Rx.  She should benefit from cont skilled PT services to address impairments and to improve overall function.        OBJECTIVE IMPAIRMENTS Abnormal gait, decreased activity tolerance, decreased balance, decreased endurance, decreased mobility, difficulty walking, decreased strength, and pain.   ACTIVITY LIMITATIONS stairs and locomotion level  PARTICIPATION LIMITATIONS: community activity  PERSONAL FACTORS 3+ comorbidities: RA, neuropathy, osteoporosis, OA, and Hx of Lumbar radiculopathy and L LE pain  are also affecting patient's functional outcome.   REHAB POTENTIAL: Good  CLINICAL DECISION MAKING: Stable/uncomplicated  EVALUATION COMPLEXITY: Low   GOALS:   SHORT TERM GOALS: Target date:12/12/2021 Pt will be independent and compliant with HEP for improved pain, strength, and  function.  Baseline: Goal status: INITIAL  2.  Pt will demo improved gait speed without increased pain.  Baseline:  Goal status: INITIAL  3.  Pt will report at least a 25% improvement in pain and mobility.  Baseline:  Goal status: INITIAL    LONG TERM GOALS: Target date: 01/02/2022  Pt will demo improved R LE strength to 5/5 in hip flexion, ER, and knee extension and 4+/5 to hip abduction for improved performance of functional mobility.  Baseline:  Goal status: INITIAL  2.  Pt will ambulate extended community distance without significant pain and difficulty.   Baseline:  Goal status: INITIAL  3.  Pt will be able to perform stairs without significant hip pain. Baseline:  Goal status: INITIAL    PLAN: PT FREQUENCY: 2x/week  PT DURATION: 6 weeks  PLANNED INTERVENTIONS: Therapeutic exercises, Therapeutic activity, Neuromuscular re-education, Balance training, Gait training, Patient/Family education, Stair training, DME instructions, Aquatic Therapy, Dry Needling, Electrical stimulation, Cryotherapy, Moist heat, Taping, Ultrasound, Manual therapy, and Re-evaluation  PLAN FOR NEXT SESSION: STM to ITB and glute.  Progress glute/hip strengthening per pt tolerance.  Cont with leg press.       Selinda Michaels III PT, DPT 12/24/21 4:21 PM

## 2021-12-27 ENCOUNTER — Ambulatory Visit (HOSPITAL_BASED_OUTPATIENT_CLINIC_OR_DEPARTMENT_OTHER): Payer: Medicare PPO | Admitting: Physical Therapy

## 2021-12-27 DIAGNOSIS — R262 Difficulty in walking, not elsewhere classified: Secondary | ICD-10-CM | POA: Diagnosis not present

## 2021-12-27 DIAGNOSIS — M25551 Pain in right hip: Secondary | ICD-10-CM | POA: Diagnosis not present

## 2021-12-27 DIAGNOSIS — M5459 Other low back pain: Secondary | ICD-10-CM

## 2021-12-27 DIAGNOSIS — M6281 Muscle weakness (generalized): Secondary | ICD-10-CM

## 2021-12-27 NOTE — Therapy (Signed)
OUTPATIENT PHYSICAL THERAPY LOWER EXTREMITY EVALUATION   Patient Name: Felicia [name] MRN: [id number] DOB:1940-08-05, 81 y.o., female Today's Date: 12/28/2021   PT End of Session - 12/27/21 1608     Visit Number 9    Number of Visits 12    Date for PT Re-Evaluation 01/02/22    Authorization Type [employer]are    PT Start Time 1522    PT Stop Time 1602    PT Time Calculation (min) 40 min    Activity Tolerance Patient tolerated treatment well    Behavior During Therapy [facility] for tasks assessed/performed                    Past Medical History:  Diagnosis Date   Adenomatous colon polyp 2004   Colo in 2009 "no polyps"   Complication of anesthesia    small airway per patient   Echocardiogram    Echo 8/22 EF 60-65, no RWMA, mild LVH, normal RVSF, moderately elevated PASP (RVSP 50.9), trivial MR, mild AI, AV sclerosis without stenosis   GERD (gastroesophageal reflux disease)    Glaucoma    Hiatal hernia    Hypertension    Neuropathy    Nuclear stress test    Myoview 8/22: EF 65, no ischemia; low risk   Rheumatoid arthritis(714.0)    Orencia; Methotrexate (Dr. Patrecia Pour)   Sleep apnea    on CPAP   Past Surgical History:  Procedure Laterality Date   CARDIAC CATHETERIZATION N/A 03/29/2015   Procedure: Left Heart Cath and Coronary Angiography;  Surgeon: Belva Crome, MD;  Location: [facility];  Service: Cardiovascular;  Laterality: N/A;   CATARACT EXTRACTION, BILATERAL     COLONOSCOPY W/ POLYPECTOMY     X1; negative subsequently   DILATION AND CURETTAGE OF UTERUS     EYE SURGERY     due to RA   EYE SURGERY Left 06/2017   eyelid tendon    HYSTEROSCOPY WITH D & C  06/24/2012   Procedure: DILATATION AND CURETTAGE /HYSTEROSCOPY;  Surgeon: [name], MD;  Location: [facility];  Service: Gynecology;  Laterality: N/A;   SHOULDER SURGERY Left    due to RA   SQUAMOUS CELL CARCINOMA EXCISION     scalp and left leg   TRABECULECTOMY     X 2 OD; X 1 OS    UPPER GASTROINTESTINAL ENDOSCOPY     hiatal hernia   Patient Active Problem List   Diagnosis Date Noted   Greater trochanteric pain syndrome of right lower extremity 10/23/2021   Age-related osteoporosis without current pathological fracture 06/26/2021   Aplastic anemia ([place]) 06/26/2021   Hiatal hernia 06/26/2021   Prediabetes 06/26/2021   Neuropathy 06/16/2020   SIADH (syndrome of inappropriate ADH production) ([place]) 02/18/2020   Hyponatremia 11/18/2019   Thrombocytosis 08/01/2018   URI (upper respiratory infection) 04/14/2017   Primary osteoarthritis of both knees 03/06/2017   Exertional chest pain    Pain in the chest 03/27/2015   Postural hypotension 05/26/2013   OSA (obstructive sleep apnea) 03/22/2013   Lens replaced by other means 08/25/2012   Low tension open-angle glaucoma(365.12) 02/14/2012   Osteoarthritis 11/07/2011   Abdominal bruit 10/16/2011   HTN (hypertension) 08/13/2011   Anisocoria 08/13/2011   Cervical radiculopathy 07/17/2011   DDD (degenerative disc disease), cervical 07/10/2011   Glaucoma 06/06/2011   HIATAL HERNIA 01/30/2010   POSTMENOPAUSAL SYNDROME 12/14/2009   EUSTACHIAN TUBE DYSFUNCTION, RIGHT 06/28/2009   GERD 12/07/2008   Rheumatoid arthritis ([place]) 12/07/2008  COLONIC POLYPS, HX OF 12/07/2008   Hyperlipidemia LDL goal <70 10/17/2006   HYPERCALCEMIA 10/17/2006   Mitral valve disease 10/17/2006    PCP: Kathyrn Lass, MD  REFERRING PROVIDER: [place], Raymond J, MD   REFERRING DIAG: [phone] (ICD-10-CM) - Greater trochanteric pain syndrome of right lower extremity   THERAPY DIAG:  Pain in right hip  Other low back pain  Muscle weakness (generalized)  Difficulty in walking, not elsewhere classified  Rationale for Evaluation and Treatment Rehabilitation  ONSET DATE: May 2023  SUBJECTIVE:   SUBJECTIVE STATEMENT: Pt states she had increased lumbar pain after prior Rx which she thinks it was from the different leg press we used last  time.  Pt states she used the roller on her ITB at home.  Pt reports she is not having the severe pain.  Pt reports compliance with HEP.   Pt reports improved walking.  Pt states she is excited about how much stronger her legs are.     PERTINENT HISTORY: RA, neuropathy, osteoporosis, OA, and Hx of Lumbar radiculopathy and L LE pain, severe glaucoma  PAIN:  Are you having pain? yes NPRS:  Current:  5/10 lumbar and 2/10 hip Location:  hips and lumbar Type:  achey and dull   PRECAUTIONS: Other: Osteoporosis, RA, and hx of lumbar radiculopathy and L LE pain; lumbar anterolisthesis  WEIGHT BEARING RESTRICTIONS No  FALLS:  Has patient fallen in last 6 months? No   OCCUPATION: Pt is retired  PLOF: Independent  PATIENT GOALS to improve strength in legs, move better, and reduce pain    OBJECTIVE:   DIAGNOSTIC FINDINGS: Lumbar MRI on 07/19/2020: IMPRESSION: 1. Multilevel disc and facet degeneration with levoscoliosis and L3-4, L4-5 anterolisthesis. 2. L4-5 left paracentral extrusion with superior migration and impingement on the left L4 and L5 nerve roots. No other neural impingement, suspect this is the symptomatic finding. 3. Active facet arthritis on the left at L4-5.   TODAY'S TREATMENT   Therapeutic Exercise: Reviewed current function, pain level, and response to prior Rx.  Pt performed: Nustep L4 x 5 minutes with bilat UE/LEs   Bridges 2x10  Cybex leg press 40# 3x10 reps Standing hip abd x 10 reps and 2x10 with YTB LAQ x15 bilat with 3#, 2x10-15 reps with 4# Sidestepping with light UE support at rail x 1 lap and x 2 laps with RTB around thighs Step ups on 4 inch step without UE support 2x10 reps   Manual Therapy: Pt received STM and rolling to R glute, lateral hip and ITB  in L S/L'ing with pillow between knees to improve soft tissue tightness, mobility, and pain.    Marland Kitchen  PATIENT EDUCATION:  Education details:  HEP, relevant anatomy, exercise form, exercise  rationale, and POC.  PT answered pt's questions.   Person educated: Patient Education method: Explanation, Demonstration, Tactile cues, Verbal cues, and Handouts Education comprehension: verbalized understanding, returned demonstration, verbal cues required, tactile cues required, and needs further education   HOME EXERCISE PROGRAM: Access Code: [email or web address] URL: [email or web address] Date: 11/21/2021 Prepared by: Ronny Flurry   And self massage to ITB Updated HEP. - Hooklying Clamshell with Resistance  - 1 x daily - 3-4 x weekly - 2 sets - 10 reps - Seated Knee Extension with Resistance  - 1 x daily - 3 x weekly - 2-3 sets - 10 reps  ASSESSMENT:  CLINICAL IMPRESSION: Pt is progressing well with pain, function, and sx's as evidenced by subjective reports.  Pt used the initial leg  press today, not the one she used prior Rx, and had no lumbar issues.  PT progressed exercises by increasing resistance and adding resistance to select exercises today.  Pt tolerated progression of exercises well without c/o's.  Pt responded well to Rx reporting improved pain in lumbar from 5/10 before Rx to 2/10 after Rx.  Pt reports no change in hip pain.  She should benefit from cont skilled PT services to address impairments and to improve overall function.       OBJECTIVE IMPAIRMENTS Abnormal gait, decreased activity tolerance, decreased balance, decreased endurance, decreased mobility, difficulty walking, decreased strength, and pain.   ACTIVITY LIMITATIONS stairs and locomotion level  PARTICIPATION LIMITATIONS: community activity  PERSONAL FACTORS 3+ comorbidities: RA, neuropathy, osteoporosis, OA, and Hx of Lumbar radiculopathy and L LE pain  are also affecting patient's functional outcome.   REHAB POTENTIAL: Good  CLINICAL DECISION MAKING: Stable/uncomplicated  EVALUATION COMPLEXITY: Low   GOALS:   SHORT TERM GOALS: Target date:12/12/2021 Pt will be independent and compliant with  HEP for improved pain, strength, and function.  Baseline: Goal status: INITIAL  2.  Pt will demo improved gait speed without increased pain.  Baseline:  Goal status: INITIAL  3.  Pt will report at least a 25% improvement in pain and mobility.  Baseline:  Goal status: INITIAL    LONG TERM GOALS: Target date: 01/02/2022  Pt will demo improved R LE strength to 5/5 in hip flexion, ER, and knee extension and 4+/5 to hip abduction for improved performance of functional mobility.  Baseline:  Goal status: INITIAL  2.  Pt will ambulate extended community distance without significant pain and difficulty.   Baseline:  Goal status: INITIAL  3.  Pt will be able to perform stairs without significant hip pain. Baseline:  Goal status: INITIAL    PLAN: PT FREQUENCY: 2x/week  PT DURATION: 6 weeks  PLANNED INTERVENTIONS: Therapeutic exercises, Therapeutic activity, Neuromuscular re-education, Balance training, Gait training, Patient/Family education, Stair training, DME instructions, Aquatic Therapy, Dry Needling, Electrical stimulation, Cryotherapy, Moist heat, Taping, Ultrasound, Manual therapy, and Re-evaluation  PLAN FOR NEXT SESSION: PN next visit.  STM to ITB and glute.  Progress glute/hip strengthening per pt tolerance.  Cont with leg press.       Selinda Michaels III PT, DPT 12/28/21 12:11 PM

## 2021-12-28 ENCOUNTER — Encounter (HOSPITAL_BASED_OUTPATIENT_CLINIC_OR_DEPARTMENT_OTHER): Payer: Self-pay | Admitting: Physical Therapy

## 2021-12-31 ENCOUNTER — Ambulatory Visit (HOSPITAL_BASED_OUTPATIENT_CLINIC_OR_DEPARTMENT_OTHER): Payer: Medicare PPO | Admitting: Physical Therapy

## 2022-01-01 ENCOUNTER — Ambulatory Visit (HOSPITAL_BASED_OUTPATIENT_CLINIC_OR_DEPARTMENT_OTHER): Payer: Medicare PPO | Attending: Family Medicine | Admitting: Physical Therapy

## 2022-01-01 DIAGNOSIS — M5459 Other low back pain: Secondary | ICD-10-CM | POA: Diagnosis not present

## 2022-01-01 DIAGNOSIS — R262 Difficulty in walking, not elsewhere classified: Secondary | ICD-10-CM | POA: Diagnosis not present

## 2022-01-01 DIAGNOSIS — M6281 Muscle weakness (generalized): Secondary | ICD-10-CM | POA: Insufficient documentation

## 2022-01-01 DIAGNOSIS — M25551 Pain in right hip: Secondary | ICD-10-CM | POA: Insufficient documentation

## 2022-01-01 NOTE — Therapy (Signed)
OUTPATIENT PHYSICAL THERAPY LOWER EXTREMITY Progress Note    Patient Name: Felicia [name] MRN: [id number] DOB:07-Jul-1940, 81 y.o., female Today's Date: 01/02/2022   PT End of Session - 01/02/22 0715     Visit Number 10    Number of Visits 12    Date for PT Re-Evaluation 01/02/22    Authorization Type [employer] medicare progress note done on visit 10    PT Start Time 1430    PT Stop Time 1512    PT Time Calculation (min) 42 min    Activity Tolerance Patient tolerated treatment well    Behavior During Therapy [facility] for tasks assessed/performed               Progress Note Reporting Period 11/21/2021 to 01/01/2022  See note below for Objective Data and Assessment of Progress/Goals.            Past Medical History:  Diagnosis Date   Adenomatous colon polyp 2004   Colo in 2009 "no polyps"   Complication of anesthesia    small airway per patient   Echocardiogram    Echo 8/22 EF 60-65, no RWMA, mild LVH, normal RVSF, moderately elevated PASP (RVSP 50.9), trivial MR, mild AI, AV sclerosis without stenosis   GERD (gastroesophageal reflux disease)    Glaucoma    Hiatal hernia    Hypertension    Neuropathy    Nuclear stress test    Myoview 8/22: EF 65, no ischemia; low risk   Rheumatoid arthritis(714.0)    Orencia; Methotrexate (Dr. Patrecia Pour)   Sleep apnea    on CPAP   Past Surgical History:  Procedure Laterality Date   CARDIAC CATHETERIZATION N/A 03/29/2015   Procedure: Left Heart Cath and Coronary Angiography;  Surgeon: Belva Crome, MD;  Location: [place] [facility];  Service: Cardiovascular;  Laterality: N/A;   CATARACT EXTRACTION, BILATERAL     COLONOSCOPY W/ POLYPECTOMY     X1; negative subsequently   DILATION AND CURETTAGE OF UTERUS     EYE SURGERY     due to RA   EYE SURGERY Left 06/2017   eyelid tendon    HYSTEROSCOPY WITH D & C  06/24/2012   Procedure: DILATATION AND CURETTAGE /HYSTEROSCOPY;  Surgeon: [name], MD;  Location: [facility];  Service:  Gynecology;  Laterality: N/A;   SHOULDER SURGERY Left    due to RA   SQUAMOUS CELL CARCINOMA EXCISION     scalp and left leg   TRABECULECTOMY     X 2 OD; X 1 OS   UPPER GASTROINTESTINAL ENDOSCOPY     hiatal hernia   Patient Active Problem List   Diagnosis Date Noted   Greater trochanteric pain syndrome of right lower extremity 10/23/2021   Age-related osteoporosis without current pathological fracture 06/26/2021   Aplastic anemia ([place]) 06/26/2021   Hiatal hernia 06/26/2021   Prediabetes 06/26/2021   Neuropathy 06/16/2020   SIADH (syndrome of inappropriate ADH production) ([place]) 02/18/2020   Hyponatremia 11/18/2019   Thrombocytosis 08/01/2018   URI (upper respiratory infection) 04/14/2017   Primary osteoarthritis of both knees 03/06/2017   Exertional chest pain    Pain in the chest 03/27/2015   Postural hypotension 05/26/2013   OSA (obstructive sleep apnea) 03/22/2013   Lens replaced by other means 08/25/2012   Low tension open-angle glaucoma(365.12) 02/14/2012   Osteoarthritis 11/07/2011   Abdominal bruit 10/16/2011   HTN (hypertension) 08/13/2011   Anisocoria 08/13/2011   Cervical radiculopathy 07/17/2011   DDD (degenerative disc disease), cervical  07/10/2011   Glaucoma 06/06/2011   HIATAL HERNIA 01/30/2010   POSTMENOPAUSAL SYNDROME 12/14/2009   EUSTACHIAN TUBE DYSFUNCTION, RIGHT 06/28/2009   GERD 12/07/2008   Rheumatoid arthritis ([place]) 12/07/2008   COLONIC POLYPS, HX OF 12/07/2008   Hyperlipidemia LDL goal <70 10/17/2006   HYPERCALCEMIA 10/17/2006   Mitral valve disease 10/17/2006    PCP: Kathyrn Lass, MD  REFERRING PROVIDER: de [place], Raymond J, MD   REFERRING DIAG: [phone] (ICD-10-CM) - Greater trochanteric pain syndrome of right lower extremity   THERAPY DIAG:  Pain in right hip  Other low back pain  Muscle weakness (generalized)  Difficulty in walking, not elsewhere classified  Rationale for Evaluation and Treatment Rehabilitation  ONSET DATE: May  2023  SUBJECTIVE:   SUBJECTIVE STATEMENT: The patient reports she ihas been doing pretty well. She continues to have some pain in the right side but it is a little better then it was las visit. She has been working on her exrcises at home.     PERTINENT HISTORY: RA, neuropathy, osteoporosis, OA, and Hx of Lumbar radiculopathy and L LE pain, severe glaucoma  PAIN:  Are you having pain? yes NPRS:  Current:  5/10 lumbar and 2/10 hip Location:  hips and lumbar Type:  achey and dull   PRECAUTIONS: Other: Osteoporosis, RA, and hx of lumbar radiculopathy and L LE pain; lumbar anterolisthesis  WEIGHT BEARING RESTRICTIONS No  FALLS:  Has patient fallen in last 6 months? No   OCCUPATION: Pt is retired  PLOF: Independent  PATIENT GOALS to improve strength in legs, move better, and reduce pain    OBJECTIVE:   DIAGNOSTIC FINDINGS: Lumbar MRI on 07/19/2020: IMPRESSION: 1. Multilevel disc and facet degeneration with levoscoliosis and L3-4, L4-5 anterolisthesis. 2. L4-5 left paracentral extrusion with superior migration and impingement on the left L4 and L5 nerve roots. No other neural impingement, suspect this is the symptomatic finding. 3. Active facet arthritis on the left at L4-5.   DIAGNOSTIC FINDINGS: Lumbar MRI on 07/19/2020: IMPRESSION: 1. Multilevel disc and facet degeneration with levoscoliosis and L3-4, L4-5 anterolisthesis. 2. L4-5 left paracentral extrusion with superior migration and impingement on the left L4 and L5 nerve roots. No other neural impingement, suspect this is the symptomatic finding. 3. Active facet arthritis on the left at L4-5.   PATIENT SURVEYS:  FOTO 41 with a goal of 56 at visit #13   COGNITION:           Overall cognitive status: Within functional limits for tasks assessed                              PALPATION: Increased tenderness at bilat GT.  Also tender to palpate bilat lateral hip and glute.  Very tender to palpate R IT and  minimally on L.  Progress note: iproved tenderness to palpation   Gait  Progress note: improved single leg stance time; improved right hip flexion     LOWER EXTREMITY ROM:   Active ROM Right eval Left eval  Hip flexion      Hip extension      Hip abduction      Hip adduction      Hip internal rotation      Hip external rotation 28 27  Knee flexion      Knee extension      Ankle dorsiflexion      Ankle plantarflexion      Ankle inversion  Ankle eversion       (Blank rows = not tested)   LOWER EXTREMITY MMT:   MMT Right eval Left eval Right  8/1  Hip flexion 4+/5 5/5   Hip extension       Hip abduction 4-/5 4-/5 4+/5  Hip adduction       Hip internal rotation       Hip external rotation 4/5 5/5 4+/5  Knee flexion 5/5 4+/5 4+/5  Knee extension 4/5 5/5   Ankle dorsiflexion       Ankle plantarflexion       Ankle inversion       Ankle eversion        (Blank rows = not tested)     TODAY'S TREATMENT  Nustep L4 x 5 minutes with bilat UE/LEs Bridges 2x10  LAQ 3lbs 2x15 each leg  Step ups on 4 inch step without UE support 2x10 reps  Gym: cable rows 10 lbs 2x10 with cuing for breathing  Shoulder extension 2x10 10 lbs      Manual Therapy: Pt received STM and rolling to R glute, lateral hip and ITB  in L S/L'ing with pillow between knees to improve soft tissue tightness, mobility, and pain.     Last visit   Therapeutic Exercise: Reviewed current function, pain level, and response to prior Rx.  Pt performed: Nustep L4 x 5 minutes with bilat UE/LEs   Bridges 2x10  Cybex leg press 40# 3x10 reps Standing hip abd x 10 reps and 2x10 with YTB LAQ x15 bilat with 3#, 2x10-15 reps with 4# Sidestepping with light UE support at rail x 1 lap and x 2 laps with RTB around thighs Step ups on 4 inch step without UE support 2x10 reps   Manual Therapy: Pt received STM and rolling to R glute, lateral hip and ITB  in L S/L'ing with pillow between knees to improve soft  tissue tightness, mobility, and pain.    Marland Kitchen  PATIENT EDUCATION:  Education details:  HEP, relevant anatomy, exercise form, exercise rationale, and POC.  PT answered pt's questions.   Person educated: Patient Education method: Explanation, Demonstration, Tactile cues, Verbal cues, and Handouts Education comprehension: verbalized understanding, returned demonstration, verbal cues required, tactile cues required, and needs further education   HOME EXERCISE PROGRAM: Access Code: [email or web address] URL: [email or web address] Date: 11/21/2021 Prepared by: Ronny Flurry   And self massage to ITB Updated HEP. - Hooklying Clamshell with Resistance  - 1 x daily - 3-4 x weekly - 2 sets - 10 reps - Seated Knee Extension with Resistance  - 1 x daily - 3 x weekly - 2-3 sets - 10 reps  ASSESSMENT:  CLINICAL IMPRESSION: The patient is making progress. Her gait has improved as well as her tenderness to palpation. One of her goals is to return to a gym program.  Therapy reviewed cable exercise that she can do for core stabilization. She tolerated well. We continue to progress steps and stairs. The patient has one more visit scheduled. We will review plan of care and plan going forward next visit.   OBJECTIVE IMPAIRMENTS Abnormal gait, decreased activity tolerance, decreased balance, decreased endurance, decreased mobility, difficulty walking, decreased strength, and pain.   ACTIVITY LIMITATIONS stairs and locomotion level  PARTICIPATION LIMITATIONS: community activity  PERSONAL FACTORS 3+ comorbidities: RA, neuropathy, osteoporosis, OA, and Hx of Lumbar radiculopathy and L LE pain  are also affecting patient's functional outcome.   REHAB POTENTIAL: Good  CLINICAL DECISION MAKING: Stable/uncomplicated  EVALUATION COMPLEXITY:  Low   GOALS:   SHORT TERM GOALS: Target date:12/12/2021 Pt will be independent and compliant with HEP for improved pain, strength, and function.  Baseline: Goal  status: INITIAL  2.  Pt will demo improved gait speed without increased pain.  Baseline:  Goal status: INITIAL  3.  Pt will report at least a 25% improvement in pain and mobility.  Baseline:  Goal status: INITIAL    LONG TERM GOALS: Target date: 01/02/2022  Pt will demo improved R LE strength to 5/5 in hip flexion, ER, and knee extension and 4+/5 to hip abduction for improved performance of functional mobility.  Baseline:  Goal status: INITIAL  2.  Pt will ambulate extended community distance without significant pain and difficulty.   Baseline:  Goal status: INITIAL  3.  Pt will be able to perform stairs without significant hip pain. Baseline:  Goal status: INITIAL    PLAN: PT FREQUENCY: 2x/week  PT DURATION: 6 weeks  PLANNED INTERVENTIONS: Therapeutic exercises, Therapeutic activity, Neuromuscular re-education, Balance training, Gait training, Patient/Family education, Stair training, DME instructions, Aquatic Therapy, Dry Needling, Electrical stimulation, Cryotherapy, Moist heat, Taping, Ultrasound, Manual therapy, and Re-evaluation  PLAN FOR NEXT SESSION: PN next visit.  STM to ITB and glute.  Progress glute/hip strengthening per pt tolerance.  Cont with leg press.      Carolyne Littles PT DPT  01/02/22 1:54 PM

## 2022-01-02 ENCOUNTER — Encounter (HOSPITAL_BASED_OUTPATIENT_CLINIC_OR_DEPARTMENT_OTHER): Payer: Self-pay | Admitting: Physical Therapy

## 2022-01-02 ENCOUNTER — Encounter (HOSPITAL_BASED_OUTPATIENT_CLINIC_OR_DEPARTMENT_OTHER): Payer: Medicare PPO | Admitting: Physical Therapy

## 2022-01-04 DIAGNOSIS — D259 Leiomyoma of uterus, unspecified: Secondary | ICD-10-CM | POA: Diagnosis not present

## 2022-01-08 NOTE — Therapy (Signed)
OUTPATIENT PHYSICAL THERAPY LOWER EXTREMITY Progress Note    Patient Name: Felicia [name] MRN: [id number] DOB:05/24/1941, 81 y.o., female Today's Date: 01/08/2022       Progress Note Reporting Period 11/21/2021 to 01/01/2022  See note below for Objective Data and Assessment of Progress/Goals.            Past Medical History:  Diagnosis Date   Adenomatous colon polyp 2004   Colo in 2009 "no polyps"   Complication of anesthesia    small airway per patient   Echocardiogram    Echo 8/22 EF 60-65, no RWMA, mild LVH, normal RVSF, moderately elevated PASP (RVSP 50.9), trivial MR, mild AI, AV sclerosis without stenosis   GERD (gastroesophageal reflux disease)    Glaucoma    Hiatal hernia    Hypertension    Neuropathy    Nuclear stress test    Myoview 8/22: EF 65, no ischemia; low risk   Rheumatoid arthritis(714.0)    Orencia; Methotrexate (Dr. Patrecia Pour)   Sleep apnea    on CPAP   Past Surgical History:  Procedure Laterality Date   CARDIAC CATHETERIZATION N/A 03/29/2015   Procedure: Left Heart Cath and Coronary Angiography;  Surgeon: Belva Crome, MD;  Location: [facility];  Service: Cardiovascular;  Laterality: N/A;   CATARACT EXTRACTION, BILATERAL     COLONOSCOPY W/ POLYPECTOMY     X1; negative subsequently   DILATION AND CURETTAGE OF UTERUS     EYE SURGERY     due to RA   EYE SURGERY Left 06/2017   eyelid tendon    HYSTEROSCOPY WITH D & C  06/24/2012   Procedure: DILATATION AND CURETTAGE /HYSTEROSCOPY;  Surgeon: [name], MD;  Location: [facility];  Service: Gynecology;  Laterality: N/A;   SHOULDER SURGERY Left    due to RA   SQUAMOUS CELL CARCINOMA EXCISION     scalp and left leg   TRABECULECTOMY     X 2 OD; X 1 OS   UPPER GASTROINTESTINAL ENDOSCOPY     hiatal hernia   Patient Active Problem List   Diagnosis Date Noted   Greater trochanteric pain syndrome of right lower extremity 10/23/2021   Age-related osteoporosis without current  pathological fracture 06/26/2021   Aplastic anemia ([place]) 06/26/2021   Hiatal hernia 06/26/2021   Prediabetes 06/26/2021   Neuropathy 06/16/2020   SIADH (syndrome of inappropriate ADH production) ([place]) 02/18/2020   Hyponatremia 11/18/2019   Thrombocytosis 08/01/2018   URI (upper respiratory infection) 04/14/2017   Primary osteoarthritis of both knees 03/06/2017   Exertional chest pain    Pain in the chest 03/27/2015   Postural hypotension 05/26/2013   OSA (obstructive sleep apnea) 03/22/2013   Lens replaced by other means 08/25/2012   Low tension open-angle glaucoma(365.12) 02/14/2012   Osteoarthritis 11/07/2011   Abdominal bruit 10/16/2011   HTN (hypertension) 08/13/2011   Anisocoria 08/13/2011   Cervical radiculopathy 07/17/2011   DDD (degenerative disc disease), cervical 07/10/2011   Glaucoma 06/06/2011   HIATAL HERNIA 01/30/2010   POSTMENOPAUSAL SYNDROME 12/14/2009   EUSTACHIAN TUBE DYSFUNCTION, RIGHT 06/28/2009   GERD 12/07/2008   Rheumatoid arthritis ([place]) 12/07/2008   COLONIC POLYPS, HX OF 12/07/2008   Hyperlipidemia LDL goal <70 10/17/2006   HYPERCALCEMIA 10/17/2006   Mitral valve disease 10/17/2006    PCP: Kathyrn Lass, MD  REFERRING PROVIDER: de [place], Raymond J, MD   REFERRING DIAG: [phone] (ICD-10-CM) - Greater trochanteric pain syndrome of right lower extremity   THERAPY DIAG:  No diagnosis found.  Rationale for Evaluation and Treatment Rehabilitation  ONSET DATE: May 2023  SUBJECTIVE:   SUBJECTIVE STATEMENT: The patient reports she ihas been doing pretty well. She continues to have some pain in the right side but it is a little better then it was las visit. She has been working on her exrcises at home.   Recert vs discharge???????????????????    PERTINENT HISTORY: RA, neuropathy, osteoporosis, OA, and Hx of Lumbar radiculopathy and L LE pain, severe glaucoma  PAIN:  Are you having pain? yes NPRS:  Current:  5/10 lumbar and 2/10 hip Location:   hips and lumbar Type:  achey and dull   PRECAUTIONS: Other: Osteoporosis, RA, and hx of lumbar radiculopathy and L LE pain; lumbar anterolisthesis  WEIGHT BEARING RESTRICTIONS No  FALLS:  Has patient fallen in last 6 months? No   OCCUPATION: Pt is retired  PLOF: Independent  PATIENT GOALS to improve strength in legs, move better, and reduce pain    OBJECTIVE:   DIAGNOSTIC FINDINGS: Lumbar MRI on 07/19/2020: IMPRESSION: 1. Multilevel disc and facet degeneration with levoscoliosis and L3-4, L4-5 anterolisthesis. 2. L4-5 left paracentral extrusion with superior migration and impingement on the left L4 and L5 nerve roots. No other neural impingement, suspect this is the symptomatic finding. 3. Active facet arthritis on the left at L4-5.   DIAGNOSTIC FINDINGS: Lumbar MRI on 07/19/2020: IMPRESSION: 1. Multilevel disc and facet degeneration with levoscoliosis and L3-4, L4-5 anterolisthesis. 2. L4-5 left paracentral extrusion with superior migration and impingement on the left L4 and L5 nerve roots. No other neural impingement, suspect this is the symptomatic finding. 3. Active facet arthritis on the left at L4-5.   PATIENT SURVEYS:  FOTO 41 with a goal of 56 at visit #13   COGNITION:           Overall cognitive status: Within functional limits for tasks assessed                              PALPATION: Increased tenderness at bilat GT.  Also tender to palpate bilat lateral hip and glute.  Very tender to palpate R IT and minimally on L.  Progress note: iproved tenderness to palpation   Gait  Progress note: improved single leg stance time; improved right hip flexion     LOWER EXTREMITY ROM:   Active ROM Right eval Left eval  Hip flexion      Hip extension      Hip abduction      Hip adduction      Hip internal rotation      Hip external rotation 28 27  Knee flexion      Knee extension      Ankle dorsiflexion      Ankle plantarflexion      Ankle inversion       Ankle eversion       (Blank rows = not tested)   LOWER EXTREMITY MMT:   MMT Right eval Left eval Right  8/1  Hip flexion 4+/5 5/5   Hip extension       Hip abduction 4-/5 4-/5 4+/5  Hip adduction       Hip internal rotation       Hip external rotation 4/5 5/5 4+/5  Knee flexion 5/5 4+/5 4+/5  Knee extension 4/5 5/5   Ankle dorsiflexion       Ankle plantarflexion       Ankle inversion  Ankle eversion        (Blank rows = not tested)     TODAY'S TREATMENT  Nustep L4 x 5 minutes with bilat UE/LEs Bridges 2x10  LAQ 3lbs 2x15 each leg  Step ups on 4 inch step without UE support 2x10 reps  Gym: cable rows 10 lbs 2x10 with cuing for breathing  Shoulder extension 2x10 10 lbs      Manual Therapy: Pt received STM and rolling to R glute, lateral hip and ITB  in L S/L'ing with pillow between knees to improve soft tissue tightness, mobility, and pain.     Last visit   Therapeutic Exercise: Reviewed current function, pain level, and response to prior Rx.  Pt performed: Nustep L4 x 5 minutes with bilat UE/LEs   Bridges 2x10  Cybex leg press 40# 3x10 reps Standing hip abd x 10 reps and 2x10 with YTB LAQ x15 bilat with 3#, 2x10-15 reps with 4# Sidestepping with light UE support at rail x 1 lap and x 2 laps with RTB around thighs Step ups on 4 inch step without UE support 2x10 reps   Manual Therapy: Pt received STM and rolling to R glute, lateral hip and ITB  in L S/L'ing with pillow between knees to improve soft tissue tightness, mobility, and pain.    Marland Kitchen  PATIENT EDUCATION:  Education details:  HEP, relevant anatomy, exercise form, exercise rationale, and POC.  PT answered pt's questions.   Person educated: Patient Education method: Explanation, Demonstration, Tactile cues, Verbal cues, and Handouts Education comprehension: verbalized understanding, returned demonstration, verbal cues required, tactile cues required, and needs further education   HOME EXERCISE  PROGRAM: Access Code: [email or web address] URL: [email or web address] Date: 11/21/2021 Prepared by: Ronny Flurry   And self massage to ITB Updated HEP. - Hooklying Clamshell with Resistance  - 1 x daily - 3-4 x weekly - 2 sets - 10 reps - Seated Knee Extension with Resistance  - 1 x daily - 3 x weekly - 2-3 sets - 10 reps  ASSESSMENT:  CLINICAL IMPRESSION: The patient is making progress. Her gait has improved as well as her tenderness to palpation. One of her goals is to return to a gym program.  Therapy reviewed cable exercise that she can do for core stabilization. She tolerated well. We continue to progress steps and stairs. The patient has one more visit scheduled. We will review plan of care and plan going forward next visit.   OBJECTIVE IMPAIRMENTS Abnormal gait, decreased activity tolerance, decreased balance, decreased endurance, decreased mobility, difficulty walking, decreased strength, and pain.   ACTIVITY LIMITATIONS stairs and locomotion level  PARTICIPATION LIMITATIONS: community activity  PERSONAL FACTORS 3+ comorbidities: RA, neuropathy, osteoporosis, OA, and Hx of Lumbar radiculopathy and L LE pain  are also affecting patient's functional outcome.   REHAB POTENTIAL: Good  CLINICAL DECISION MAKING: Stable/uncomplicated  EVALUATION COMPLEXITY: Low   GOALS:   SHORT TERM GOALS: Target date:12/12/2021 Pt will be independent and compliant with HEP for improved pain, strength, and function.  Baseline: Goal status: INITIAL  2.  Pt will demo improved gait speed without increased pain.  Baseline:  Goal status: INITIAL  3.  Pt will report at least a 25% improvement in pain and mobility.  Baseline:  Goal status: INITIAL    LONG TERM GOALS: Target date: 01/02/2022  Pt will demo improved R LE strength to 5/5 in hip flexion, ER, and knee extension and 4+/5 to hip abduction for improved performance of functional mobility.  Baseline:  Goal status: INITIAL  2.   Pt will ambulate extended community distance without significant pain and difficulty.   Baseline:  Goal status: INITIAL  3.  Pt will be able to perform stairs without significant hip pain. Baseline:  Goal status: INITIAL    PLAN: PT FREQUENCY: 2x/week  PT DURATION: 6 weeks  PLANNED INTERVENTIONS: Therapeutic exercises, Therapeutic activity, Neuromuscular re-education, Balance training, Gait training, Patient/Family education, Stair training, DME instructions, Aquatic Therapy, Dry Needling, Electrical stimulation, Cryotherapy, Moist heat, Taping, Ultrasound, Manual therapy, and Re-evaluation  PLAN FOR NEXT SESSION:   STM to ITB and glute.  Progress glute/hip strengthening per pt tolerance.  Cont with leg press.      Carolyne Littles PT DPT  01/08/22 11:15 PM

## 2022-01-09 ENCOUNTER — Ambulatory Visit (HOSPITAL_BASED_OUTPATIENT_CLINIC_OR_DEPARTMENT_OTHER): Payer: Medicare PPO | Admitting: Physical Therapy

## 2022-01-09 ENCOUNTER — Encounter (HOSPITAL_BASED_OUTPATIENT_CLINIC_OR_DEPARTMENT_OTHER): Payer: Self-pay | Admitting: Physical Therapy

## 2022-01-09 DIAGNOSIS — M25551 Pain in right hip: Secondary | ICD-10-CM

## 2022-01-09 DIAGNOSIS — M6281 Muscle weakness (generalized): Secondary | ICD-10-CM

## 2022-01-09 DIAGNOSIS — R262 Difficulty in walking, not elsewhere classified: Secondary | ICD-10-CM

## 2022-01-09 DIAGNOSIS — M5459 Other low back pain: Secondary | ICD-10-CM | POA: Diagnosis not present

## 2022-01-17 ENCOUNTER — Telehealth: Payer: Self-pay | Admitting: Rheumatology

## 2022-01-17 DIAGNOSIS — Z79899 Other long term (current) drug therapy: Secondary | ICD-10-CM

## 2022-01-17 DIAGNOSIS — Z9225 Personal history of immunosupression therapy: Secondary | ICD-10-CM

## 2022-01-17 DIAGNOSIS — Z111 Encounter for screening for respiratory tuberculosis: Secondary | ICD-10-CM

## 2022-01-17 NOTE — Telephone Encounter (Signed)
Patient advised she is due to update her TB Gold as well as her CBC/CMP.  Orders faxed to PCP as requested.

## 2022-01-17 NOTE — Telephone Encounter (Signed)
Patient left a voicemail requesting a return call to let her know if her TB gold is due and if the orders will be sent to her PCP.

## 2022-01-18 DIAGNOSIS — Z111 Encounter for screening for respiratory tuberculosis: Secondary | ICD-10-CM | POA: Diagnosis not present

## 2022-01-21 ENCOUNTER — Other Ambulatory Visit: Payer: Self-pay | Admitting: Physician Assistant

## 2022-01-21 DIAGNOSIS — M0579 Rheumatoid arthritis with rheumatoid factor of multiple sites without organ or systems involvement: Secondary | ICD-10-CM

## 2022-01-21 DIAGNOSIS — Z111 Encounter for screening for respiratory tuberculosis: Secondary | ICD-10-CM | POA: Diagnosis not present

## 2022-01-21 DIAGNOSIS — Z79899 Other long term (current) drug therapy: Secondary | ICD-10-CM | POA: Diagnosis not present

## 2022-01-21 DIAGNOSIS — Z9225 Personal history of immunosupression therapy: Secondary | ICD-10-CM | POA: Diagnosis not present

## 2022-01-21 NOTE — Telephone Encounter (Signed)
Next Visit: 05/13/2022  Last Visit: 11/26/2021  Last Fill: 10/08/2021  OZ:HYQMVHQION arthritis involving multiple sites with positive rheumatoid factor   Current Dose per office note 11/26/2021: Rinvoq 15 mg 1 tablet by mouth daily  Labs: 10/19/2021 Hemoglobin is low and stable.  Platelets are elevated most likely due to anemia.  Sodium is low and stable.  TB Gold: 01/22/2022 Neg   Patient updated labs in office today.    Okay to refill Rinvoq?

## 2022-01-22 LAB — COMPLETE METABOLIC PANEL WITH GFR
AG Ratio: 2 (calc) (ref 1.0–2.5)
ALT: 19 U/L (ref 6–29)
AST: 30 U/L (ref 10–35)
Albumin: 4.6 g/dL (ref 3.6–5.1)
Alkaline phosphatase (APISO): 33 U/L — ABNORMAL LOW (ref 37–153)
BUN/Creatinine Ratio: 16 (calc) (ref 6–22)
BUN: 16 mg/dL (ref 7–25)
CO2: 20 mmol/L (ref 20–32)
Calcium: 10.5 mg/dL — ABNORMAL HIGH (ref 8.6–10.4)
Chloride: 97 mmol/L — ABNORMAL LOW (ref 98–110)
Creat: 1.01 mg/dL — ABNORMAL HIGH (ref 0.60–0.95)
Globulin: 2.3 g/dL (calc) (ref 1.9–3.7)
Glucose, Bld: 91 mg/dL (ref 65–99)
Potassium: 5.3 mmol/L (ref 3.5–5.3)
Sodium: 130 mmol/L — ABNORMAL LOW (ref 135–146)
Total Bilirubin: 0.6 mg/dL (ref 0.2–1.2)
Total Protein: 6.9 g/dL (ref 6.1–8.1)
eGFR: 56 mL/min/{1.73_m2} — ABNORMAL LOW (ref >=60)

## 2022-01-22 LAB — CBC WITH DIFFERENTIAL/PLATELET
Absolute Monocytes: 677 cells/uL (ref 200–950)
Basophils Absolute: 38 cells/uL (ref 0–200)
Basophils Relative: 0.4 %
Eosinophils Absolute: 19 cells/uL (ref 15–500)
Eosinophils Relative: 0.2 %
HCT: 33.7 % — ABNORMAL LOW (ref 35.0–45.0)
Hemoglobin: 11.3 g/dL — ABNORMAL LOW (ref 11.7–15.5)
Lymphs Abs: 1626 cells/uL (ref 850–3900)
MCH: 32.4 pg (ref 27.0–33.0)
MCHC: 33.5 g/dL (ref 32.0–36.0)
MCV: 96.6 fL (ref 80.0–100.0)
MPV: 9.2 fL (ref 7.5–12.5)
Monocytes Relative: 7.2 %
Neutro Abs: 7041 cells/uL (ref 1500–7800)
Neutrophils Relative %: 74.9 %
Platelets: 507 10*3/uL — ABNORMAL HIGH (ref 140–400)
RBC: 3.49 10*6/uL — ABNORMAL LOW (ref 3.80–5.10)
RDW: 13.2 % (ref 11.0–15.0)
Total Lymphocyte: 17.3 %
WBC: 9.4 10*3/uL (ref 3.8–10.8)

## 2022-01-22 NOTE — Progress Notes (Signed)
Hemoglobin is low and stable.  Platelets are elevated most likely due to low hemoglobin.  We will continue to monitor.  Creatinine is mildly elevated.  Calcium is mildly elevated.  Please advise patient to avoid calcium supplement.  Sodium is low.  Please forward results to her PCP.

## 2022-01-31 DIAGNOSIS — H5989 Other postprocedural complications and disorders of eye and adnexa, not elsewhere classified: Secondary | ICD-10-CM | POA: Diagnosis not present

## 2022-01-31 DIAGNOSIS — H401233 Low-tension glaucoma, bilateral, severe stage: Secondary | ICD-10-CM | POA: Diagnosis not present

## 2022-02-19 ENCOUNTER — Other Ambulatory Visit: Payer: Self-pay | Admitting: Physician Assistant

## 2022-02-19 NOTE — Telephone Encounter (Signed)
Next Visit: 05/13/2022   Last Visit: 11/26/2021   Last Fill: 02/22/2021  QN:[id number] arthritis involving multiple sites with positive rheumatoid factor    Current Dose per office note 7/61/8485: folic acid 1 mg 2 tablets daily  Okay to refill Folic Acid?

## 2022-02-27 ENCOUNTER — Other Ambulatory Visit: Payer: Self-pay | Admitting: Physician Assistant

## 2022-02-27 NOTE — Telephone Encounter (Signed)
Next Visit: 05/13/2022   Last Visit: 11/26/2021   Last Fill: 11/26/2021  BT:DVVOHYWVPX arthritis involving multiple sites with positive rheumatoid factor    Current Dose per office note 11/26/2021: Methotrexate 4 tablets by mouth every 7 days  Labs: 01/21/2022 Hemoglobin is low and stable.  Platelets are elevated most likely due to low hemoglobin.  Creatinine is mildly elevated.  Calcium is mildly elevated. Sodium is low  Okay to refill MTX?

## 2022-03-28 DIAGNOSIS — Z6823 Body mass index (BMI) 23.0-23.9, adult: Secondary | ICD-10-CM | POA: Diagnosis not present

## 2022-03-28 DIAGNOSIS — Z0000 Encounter for general adult medical examination without abnormal findings: Secondary | ICD-10-CM | POA: Diagnosis not present

## 2022-03-28 DIAGNOSIS — Z1389 Encounter for screening for other disorder: Secondary | ICD-10-CM | POA: Diagnosis not present

## 2022-04-01 DIAGNOSIS — N1831 Chronic kidney disease, stage 3a: Secondary | ICD-10-CM | POA: Diagnosis not present

## 2022-04-01 DIAGNOSIS — D6489 Other specified anemias: Secondary | ICD-10-CM | POA: Diagnosis not present

## 2022-04-01 DIAGNOSIS — R350 Frequency of micturition: Secondary | ICD-10-CM | POA: Diagnosis not present

## 2022-04-01 DIAGNOSIS — R5383 Other fatigue: Secondary | ICD-10-CM | POA: Diagnosis not present

## 2022-04-01 DIAGNOSIS — E222 Syndrome of inappropriate secretion of antidiuretic hormone: Secondary | ICD-10-CM | POA: Diagnosis not present

## 2022-04-01 DIAGNOSIS — I129 Hypertensive chronic kidney disease with stage 1 through stage 4 chronic kidney disease, or unspecified chronic kidney disease: Secondary | ICD-10-CM | POA: Diagnosis not present

## 2022-04-01 DIAGNOSIS — R7303 Prediabetes: Secondary | ICD-10-CM | POA: Diagnosis not present

## 2022-04-01 DIAGNOSIS — E7800 Pure hypercholesterolemia, unspecified: Secondary | ICD-10-CM | POA: Diagnosis not present

## 2022-04-01 DIAGNOSIS — I25119 Atherosclerotic heart disease of native coronary artery with unspecified angina pectoris: Secondary | ICD-10-CM | POA: Diagnosis not present

## 2022-04-03 ENCOUNTER — Other Ambulatory Visit: Payer: Self-pay | Admitting: Cardiology

## 2022-04-03 ENCOUNTER — Telehealth: Payer: Self-pay | Admitting: Cardiology

## 2022-04-03 DIAGNOSIS — R931 Abnormal findings on diagnostic imaging of heart and coronary circulation: Secondary | ICD-10-CM

## 2022-04-03 DIAGNOSIS — I10 Essential (primary) hypertension: Secondary | ICD-10-CM

## 2022-04-03 DIAGNOSIS — G4733 Obstructive sleep apnea (adult) (pediatric): Secondary | ICD-10-CM

## 2022-04-03 NOTE — Telephone Encounter (Signed)
What problem are you experiencing? Patient states that she thinks she need an Oxygen test done. She also says she would like to have Dr. Radford Pax check her pressure settings and see if they need adjustment. She is just having some issues with the machine at night. She is currently scheduled to for a virtual visit on 06/25/21 with Dr. Radford Pax.   Who is your medical equipment company? [employer]   Please route to the sleep study assistant.

## 2022-04-04 ENCOUNTER — Ambulatory Visit (HOSPITAL_COMMUNITY): Admission: RE | Admit: 2022-04-04 | Payer: Medicare PPO | Source: Ambulatory Visit

## 2022-04-04 DIAGNOSIS — R931 Abnormal findings on diagnostic imaging of heart and coronary circulation: Secondary | ICD-10-CM

## 2022-04-04 NOTE — Telephone Encounter (Signed)
Per Dr Radford Pax, Please get an ONO on CPAP and also get a PAP compliance download. ONO ordered pap compliance report sent to provider.

## 2022-04-08 ENCOUNTER — Encounter: Payer: Self-pay | Admitting: Physician Assistant

## 2022-04-08 ENCOUNTER — Ambulatory Visit: Payer: Medicare PPO | Attending: Family Medicine | Admitting: Physician Assistant

## 2022-04-08 VITALS — BP 144/76 | HR 71 | Ht 64.0 in | Wt 137.4 lb

## 2022-04-08 DIAGNOSIS — I25119 Atherosclerotic heart disease of native coronary artery with unspecified angina pectoris: Secondary | ICD-10-CM | POA: Insufficient documentation

## 2022-04-08 DIAGNOSIS — I10 Essential (primary) hypertension: Secondary | ICD-10-CM | POA: Diagnosis not present

## 2022-04-08 DIAGNOSIS — G4733 Obstructive sleep apnea (adult) (pediatric): Secondary | ICD-10-CM | POA: Insufficient documentation

## 2022-04-08 DIAGNOSIS — I272 Pulmonary hypertension, unspecified: Secondary | ICD-10-CM | POA: Diagnosis not present

## 2022-04-08 DIAGNOSIS — I2510 Atherosclerotic heart disease of native coronary artery without angina pectoris: Secondary | ICD-10-CM | POA: Diagnosis not present

## 2022-04-08 DIAGNOSIS — R0602 Shortness of breath: Secondary | ICD-10-CM | POA: Diagnosis not present

## 2022-04-08 DIAGNOSIS — R079 Chest pain, unspecified: Secondary | ICD-10-CM | POA: Diagnosis not present

## 2022-04-08 DIAGNOSIS — E785 Hyperlipidemia, unspecified: Secondary | ICD-10-CM | POA: Insufficient documentation

## 2022-04-08 MED ORDER — METOPROLOL TARTRATE 25 MG PO TABS: 25.0000 mg | ORAL_TABLET | Freq: Two times a day (BID) | ORAL | 3 refills | Status: DC

## 2022-04-08 NOTE — Progress Notes (Signed)
Cardiology Office Note:    Date:  04/08/2022   ID:  Felicia [name], DOB [birth year], MRN [id number]  PCP:  Kathyrn Lass, MD  [facility] Cardiologist:  Sinclair Grooms, MD     Referring MD: Kathyrn Lass, MD   Chief Complaint:  Chest Pain and Shortness of Breath    Patient Profile: Coronary artery disease (Non-obstructive) ETT 03/2015: up-sloping ST depression in II, V4-6 Cath 03/29/15: pLAD 45 Myoview 01/24/2021: No ischemia, EF 65, low risk Echocardiogram 09/2013: vigorous LVF, EF 65-70, No WMA, NL diast fxn, trivial AI, mild TR Pulmonary HTN Likely due to untreated OSA >> CPAP resumed in 2022 Echo 01/23/2021: EF 60-65, no RWMA, mild concentric LVH, normal RVSF, moderately elevated PASP (RVSP 50.9), trivial MR, mild AI, mild AV sclerosis without stenosis, RAP 3  Hypertension  Renal Art [place] 10/2011: no RAS  Hyperlipidemia  Intol of statins >> Bempedoic Acid/Ezetimibe  GERD OSA Rheumatoid arthritis    Cardiac Studies & Procedures   CARDIAC CATHETERIZATION  CARDIAC CATHETERIZATION 03/29/2015  Narrative 1. Prox LAD to Mid LAD lesion, 45% stenosed.   Widely patent coronary arteries with proximal to mid eccentric 40-45% LAD narrowing.  Normal left ventricular size and function.  False positive electrocardiographic response to exercise, likely blood pressure related.  Chest discomfort does not appear to be related to myocardial ischemia.   RECOMMENDATIONS:   Consider alternative explanations for chest pain including serositis related to rheumatoid arthritis, musculoskeletal etiology, and GI.  Consider therapy of blood pressure.  Findings Coronary Findings Diagnostic  Dominance: Right  Left Anterior Descending calcified eccentric.  Ramus Intermedius . Vessel is small.  Left Circumflex  First Obtuse Marginal Branch The vessel is small in size.  Third Obtuse Marginal Branch The vessel is small in size.  Intervention  No interventions  have been documented.   STRESS TESTS  MYOCARDIAL PERFUSION IMAGING 01/24/2021  Narrative   Findings are consistent with no ischemia. The study is low risk.   No ST deviation was noted.   LV perfusion is abnormal.   Defect 1: There is a small defect with moderate reduction in uptake present in the apical to mid anterior and septal location(s) that is fixed. There is normal wall motion in the defect area. Defect improves with stress, not suggestive of ischemia Defect 2: There is a small defect with mild reduction in uptake present in the apical to mid inferior and lateral location(s). There is normal wall motion in the defect area. Consistent with artifact caused by bowel tracer uptake.   Nuclear stress EF: 65 %. The left ventricular ejection fraction is normal (55-65%). Left ventricular function is normal. End diastolic cavity size is normal. End systolic cavity size is normal.   ECHOCARDIOGRAM  ECHOCARDIOGRAM COMPLETE 01/23/2021  Narrative ECHOCARDIOGRAM REPORT    Patient Name:   Felicia [name]  Date of Exam: 01/23/2021 Medical Rec #:  [id number]     Height:       65.0 in Accession #:    [id number]    Weight:       135.0 lb Date of Birth:  1941/04/23     BSA:          1.674 m Patient Age:    81 years      BP:           124/50 mmHg Patient Gender: F             HR:  81 bpm. Exam Location:  [address]  Procedure: 2D Echo, Cardiac Doppler and Color Doppler  Indications:    R01.1 Murmur  History:        Patient has prior history of Echocardiogram examinations, most recent 09/13/2013. CAD, Signs/Symptoms:Shortness of Breath and Murmur; Risk Factors:Hypertension and Dyslipidemia.  Sonographer:    Coralyn Helling RDCS Referring Phys: [place]   1. Left ventricular ejection fraction, by estimation, is 60 to 65%. The left ventricle has normal function. The left ventricle has no regional wall motion abnormalities. There is mild concentric left  ventricular hypertrophy. Left ventricular diastolic parameters are indeterminate. 2. Right ventricular systolic function is normal. The right ventricular size is normal. There is moderately elevated pulmonary artery systolic pressure. The estimated right ventricular systolic pressure is 07.1 mmHg. 3. The mitral valve is normal in structure. Trivial mitral valve regurgitation. No evidence of mitral stenosis. 4. The aortic valve is tricuspid. There is mild calcification of the aortic valve. Aortic valve regurgitation is mild. Mild aortic valve sclerosis is present, with no evidence of aortic valve stenosis. 5. The inferior vena cava is normal in size with greater than 50% respiratory variability, suggesting right atrial pressure of 3 mmHg.  Comparison(s): Changes from prior study are noted. Aortic regurgitation now mild.  Conclusion(s)/Recommendation(s): Otherwise normal echocardiogram, with minor abnormalities described in the report. Moderately elevated pulmonary pressures with normal RV size/function.  FINDINGS Left Ventricle: Left ventricular ejection fraction, by estimation, is 60 to 65%. The left ventricle has normal function. The left ventricle has no regional wall motion abnormalities. The left ventricular internal cavity size was normal in size. There is mild concentric left ventricular hypertrophy. Left ventricular diastolic parameters are indeterminate.  Right Ventricle: The right ventricular size is normal. Right vetricular wall thickness was not well visualized. Right ventricular systolic function is normal. There is moderately elevated pulmonary artery systolic pressure. The tricuspid regurgitant velocity is 3.46 m/s, and with an assumed right atrial pressure of 3 mmHg, the estimated right ventricular systolic pressure is 21.9 mmHg.  Left Atrium: Left atrial size was normal in size.  Right Atrium: Right atrial size was normal in size.  Pericardium: There is no evidence of  pericardial effusion.  Mitral Valve: The mitral valve is normal in structure. Trivial mitral valve regurgitation. No evidence of mitral valve stenosis.  Tricuspid Valve: The tricuspid valve is normal in structure. Tricuspid valve regurgitation is trivial. No evidence of tricuspid stenosis.  Aortic Valve: The aortic valve is tricuspid. There is mild calcification of the aortic valve. Aortic valve regurgitation is mild. Aortic regurgitation PHT measures 396 msec. Mild aortic valve sclerosis is present, with no evidence of aortic valve stenosis.  Pulmonic Valve: The pulmonic valve was not well visualized. Pulmonic valve regurgitation is not visualized. No evidence of pulmonic stenosis.  Aorta: The aortic root, ascending aorta, aortic arch and descending aorta are all structurally normal, with no evidence of dilitation or obstruction.  Venous: The inferior vena cava is normal in size with greater than 50% respiratory variability, suggesting right atrial pressure of 3 mmHg.  IAS/Shunts: The interatrial septum appears to be lipomatous. The atrial septum is grossly normal.   LEFT VENTRICLE PLAX 2D LVIDd:         4.00 cm  Diastology LVIDs:         2.70 cm  LV e' medial:    7.83 cm/s LV PW:         1.10 cm  LV E/e' medial:  12.1 LV IVS:        1.10 cm  LV e' lateral:   8.16 cm/s LVOT diam:     1.80 cm  LV E/e' lateral: 11.6 LV SV:         62 LV SV Index:   37 LVOT Area:     2.54 cm   RIGHT VENTRICLE            IVC TAPSE (M-mode): 1.9 cm     IVC diam: 1.40 cm RVSP:           50.9 mmHg  LEFT ATRIUM             Index       RIGHT ATRIUM           Index LA diam:        3.10 cm 1.85 cm/m  RA Pressure: 3.00 mmHg LA Vol (A2C):   48.8 ml 29.15 ml/m RA Area:     10.90 cm LA Vol (A4C):   34.7 ml 20.73 ml/m RA Volume:   24.90 ml  14.88 ml/m LA Biplane Vol: 41.4 ml 24.73 ml/m AORTIC VALVE LVOT Vmax:   106.00 cm/s LVOT Vmean:  67.800 cm/s LVOT VTI:    0.242 m AI PHT:      396  msec  AORTA Ao Root diam: 3.00 cm Ao Asc diam:  3.20 cm  MITRAL VALVE                TRICUSPID VALVE MV Area (PHT): 4.36 cm     TR Peak grad:   47.9 mmHg MV Decel Time: 174 msec     TR Vmax:        346.00 cm/s MV E velocity: 94.70 cm/s   Estimated RAP:  3.00 mmHg MV A velocity: 113.00 cm/s  RVSP:           50.9 mmHg MV E/A ratio:  0.84 SHUNTS Systemic VTI:  0.24 m Systemic Diam: 1.80 cm  Buford Dresser MD Electronically signed by Buford Dresser MD Signature Date/Time: 01/23/2021/11:51:28 AM    Final              History of Present Illness:   MILANA SALAY is a 81 y.o. female with the above problem list.  She was last seen by Dr. Tamala Julian in November 2022.  She returns for follow-up.  She is here alone.  She brings in all of her labs from primary care to review.  She notes continued chest tightness/heaviness.  This has been a chronic symptom for the past year.  She does note symptoms with exertion.  She has not had associated radiating symptoms, nausea or diaphoresis.  She also notes shortness of breath with exertion over the past several weeks.  She has been fatigued.  She also describes exercise intolerance.  She has been doing tai chi and balance classes at [facility] well.  She seems to tolerate those fine.  She sleeps on 2 pillows chronically.  She uses CPAP at night.  She does have some dependent leg edema that resolves with elevation.  She has not had syncope.     Past Medical History:  Diagnosis Date   Adenomatous colon polyp 2004   Colo in 2009 "no polyps"   Complication of anesthesia    small airway per patient   Echocardiogram    Echo 8/22 EF 60-65, no RWMA, mild LVH, normal RVSF, moderately elevated PASP (RVSP 50.9), trivial MR, mild AI, AV sclerosis without stenosis  GERD (gastroesophageal reflux disease)    Glaucoma    Hiatal hernia    Hypertension    Neuropathy    Nuclear stress test    Myoview 8/22: EF 65, no ischemia; low risk   Rheumatoid  arthritis(714.0)    Orencia; Methotrexate (Dr. Patrecia Pour)   Sleep apnea    on CPAP   Current Medications: Current Meds  Medication Sig   Apoaequorin (PREVAGEN PO) Take 1 tablet by mouth daily.   Bempedoic Acid-Ezetimibe (NEXLIZET) 180-10 MG TABS Take 1 tablet by mouth daily.   Carboxymethylcellulose Sodium (REFRESH OP) Apply 1 drop to eye daily as needed (FOR DRY EYES).   Cyanocobalamin (VITAMIN B-12) 2500 MCG SUBL Place 1 each under the tongue daily.   diclofenac Sodium (VOLTAREN) 1 % GEL Apply 2 g topically 3 (three) times daily.   erythromycin ophthalmic ointment 1 Application at bedtime.   esomeprazole (NEXIUM) 40 MG capsule TAKE ONE CAPSULE BY MOUTH TWICE DAILY BEFORE MEALS   estradiol (ESTRACE) 1 MG tablet Take 1 mg by mouth daily.   folic acid (FOLVITE) 1 MG tablet TAKE 2 TABLETS(2 MG) BY MOUTH DAILY   gabapentin (NEURONTIN) 100 MG capsule Take 1 capsule (100 mg total) by mouth 2 (two) times daily.   latanoprost (XALATAN) 0.005 % ophthalmic solution Place 1 drop into both eyes at bedtime.   losartan (COZAAR) 100 MG tablet Take 100 mg by mouth daily.   methotrexate (RHEUMATREX) 2.5 MG tablet TAKE 4 TABLETS BY MOUTH ONCE A WEEK   metoprolol tartrate (LOPRESSOR) 25 MG tablet Take 1 tablet (25 mg total) by mouth 2 (two) times daily.   metroNIDAZOLE (METROGEL) 0.75 % gel Apply 1 application topically 2 (two) times daily.    Polyethylene Glycol 3350 (MIRALAX PO) Take 1 Dose by mouth as needed.   progesterone (PROMETRIUM) 200 MG capsule Take 200 mg by mouth daily.   RINVOQ 15 MG TB24 TAKE 1 TABLET BY MOUTH EVERY DAY   valACYclovir (VALTREX) 1000 MG tablet Take 1,000 mg by mouth as needed (fever blister). Reported on 06/14/2015   Wheat Dextrin (BENEFIBER DRINK MIX PO) Take 1 Scoop by mouth at bedtime.   Zoledronic Acid (RECLAST IV) Inject into the vein.   [DISCONTINUED] metoprolol (LOPRESSOR) 50 MG tablet Take 50 mg by mouth daily.     Allergies:   Sulfonamide derivatives, Amlodipine,  and Norvasc [amlodipine besylate]   Social History   Tobacco Use   Smoking status: Former    Packs/day: 0.50    Years: 5.00    Total pack years: 2.50    Types: Cigarettes    Quit date: 06/04/1971    Years since quitting: 50.8   Smokeless tobacco: Never   Tobacco comments:    smoked about 3-5 years , up to 1/2 ppd  Vaping Use   Vaping Use: Never used  Substance Use Topics   Alcohol use: No   Drug use: No    Family Hx: The patient's family history includes Dementia in her mother; Heart attack (age of onset: 36) in her father; Heart disease in her mother; Lung disease in her mother; Osteoporosis in her mother; Stroke (age of onset: 58) in her maternal grandmother; Ulcerative colitis in her brother. There is no history of Diabetes, Cancer, Colon cancer, Esophageal cancer, Stomach cancer, Pancreatic cancer, or Liver disease.  Review of Systems  Constitutional: Negative for fever.  Respiratory:  Negative for cough.   Gastrointestinal:  Negative for hematochezia and melena.  Genitourinary:  Negative for hematuria.  EKGs/Labs/Other Test Reviewed:    EKG:  EKG is  ordered today.  The ekg ordered today demonstrates NSR, HR 71, normal axis, no ST-T wave changes, QTc 417, PVC, no change from prior tracing   Recent Labs: 01/21/2022: ALT 19; BUN 16; Creat 1.01; Hemoglobin 11.3; Platelets 507; Potassium 5.3; Sodium 130   Recent Lipid Panel Recent Labs    07/31/21 0907  CHOL 154  TRIG 84  HDL 60  LDLCALC 78      Risk Assessment/Calculations/Metrics:         HYPERTENSION CONTROL Vitals:   04/08/22 1359 04/08/22 1641  BP: (!) 160/60 (!) 144/76    The patient's blood pressure is elevated above target today.  In order to address the patient's elevated BP: A current anti-hypertensive medication was adjusted today.; Blood pressure will be monitored at home to determine if medication changes need to be made.       Physical Exam:    VS:  BP (!) 144/76   Pulse 71   Ht 5'  4" (1.626 m)   Wt 137 lb 6.4 oz (62.3 kg)   SpO2 96%   BMI 23.58 kg/m     Wt Readings from Last 3 Encounters:  04/08/22 137 lb 6.4 oz (62.3 kg)  12/18/21 135 lb (61.2 kg)  11/26/21 135 lb (61.2 kg)    Constitutional:      Appearance: Healthy appearance. Not in distress.  Neck:     Vascular: JVD normal.  Pulmonary:     Effort: Pulmonary effort is normal.     Breath sounds: No wheezing. No rales.  Cardiovascular:     Normal rate. Regular rhythm. Normal S1. Normal S2.      Murmurs: There is no murmur.  Edema:    Peripheral edema absent.  Abdominal:     Palpations: Abdomen is soft.  Skin:    General: Skin is warm and dry.  Neurological:     Mental Status: Alert and oriented to person, place and time.         ASSESSMENT & PLAN:   Coronary artery disease involving native coronary artery with angina pectoris [facility]) She describes fairly persistent exertional chest heaviness/tightness.  This has been ongoing for the past year.  She had a stress Myoview last year that was low risk.  She did have mild nonobstructive disease on cardiac catheterization in 2016.  She does have rheumatoid arthritis.  Her electrocardiogram does not demonstrate any significant changes.  I have recommended proceeding with coronary CTA to further evaluate her chest symptoms.  I will also arrange an echocardiogram.  She is intolerant to statins.  She will continue on bempedoic acid/ezetimibe 180/10 mg daily.  She is only taking metoprolol tartrate 50 mg once daily.  I will change this to 25 mg twice daily.  If she has significant CAD, I will start her on aspirin and arrange earlier follow-up.  If her coronary CTA does not demonstrate any significant coronary artery disease and her echocardiogram is stable, she can follow-up in 1 year.  In light of Dr. Thompson Caul retirement, I will have her follow-up with Dr. Glenford Bayley and me.  Pulmonary HTN ([place]) It was felt that her pulmonary hypertension was related to untreated  sleep apnea.  She is now using CPAP regularly.  As she is complaining of shortness of breath, I will obtain a BNP and a follow-up echocardiogram.  If she has worsening pulmonary hypertension, consider VQ scan, PFTs and referral to heart failure clinic for evaluation.  Shortness of breath As noted, obtain BNP and arrange 2D echocardiogram.  I reviewed all the labs she brought from primary care.  Her hemoglobin is mildly low (11.3).  This is not likely low enough to contribute to shortness of breath or fatigue.  Also, her TSH was recently normal.  Essential hypertension Blood pressure uncontrolled.  She notes better readings at home (130s-140s).  Adjust metoprolol tartrate to 25 mg twice daily.  Continue losartan 100 mg daily.  Have asked her to send blood pressure readings in a couple of weeks.  If her blood pressure continues to run above target, consider increasing metoprolol versus changing losartan to a more potent ARB versus adding hydralazine.  OSA (obstructive sleep apnea) Continue CPAP.  She did ask if she needed to have another overnight oximetry.  I will send a message to the [place] with Dr. Radford Pax to reach out to the patient.  Hyperlipidemia LDL goal <70 LDL 78 in Feb. She is intolerant of statins. Fair control of lipids. Continue Bempedoic acid/Ezetimibe 180/10 mg once daily.           Dispo:  Return in about 1 year (around 04/09/2023) for Routine Follow Up w Dr. Gasper Sells, or Richardson Dopp, PA-C.   Medication Adjustments/Labs and Tests Ordered: Current medicines are reviewed at length with the patient today.  Concerns regarding medicines are outlined above.  Tests Ordered: Orders Placed This Encounter  Procedures   CT CORONARY MORPH W/CTA COR W/SCORE W/CA W/CM &/OR WO/CM   Basic metabolic panel   Pro b natriuretic peptide (BNP)   EKG 12-Lead   ECHOCARDIOGRAM COMPLETE   Medication Changes: Meds ordered this encounter  Medications   metoprolol tartrate (LOPRESSOR) 25 MG  tablet    Sig: Take 1 tablet (25 mg total) by mouth 2 (two) times daily.    Dispense:  180 tablet    Refill:  3   Signed, Richardson Dopp, PA-C  04/08/2022 4:56 PM    [address], [place], [place]  [id number] Phone: [phone]; Fax: [phone]

## 2022-04-08 NOTE — Assessment & Plan Note (Signed)
LDL 78 in Feb. She is intolerant of statins. Fair control of lipids. Continue Bempedoic acid/Ezetimibe 180/10 mg once daily.

## 2022-04-08 NOTE — Assessment & Plan Note (Addendum)
She describes fairly persistent exertional chest heaviness/tightness.  This has been ongoing for the past year.  She had a stress Myoview last year that was low risk.  She did have mild nonobstructive disease on cardiac catheterization in 2016.  She does have rheumatoid arthritis.  Her electrocardiogram does not demonstrate any significant changes.  I have recommended proceeding with coronary CTA to further evaluate her chest symptoms.  I will also arrange an echocardiogram.  She is intolerant to statins.  She will continue on bempedoic acid/ezetimibe 180/10 mg daily.  She is only taking metoprolol tartrate 50 mg once daily.  I will change this to 25 mg twice daily.  If she has significant CAD, I will start her on aspirin and arrange earlier follow-up.  If her coronary CTA does not demonstrate any significant coronary artery disease and her echocardiogram is stable, she can follow-up in 1 year.  In light of Dr. Thompson Caul retirement, I will have her follow-up with Dr. Glenford Bayley and me.

## 2022-04-08 NOTE — Patient Instructions (Addendum)
Medication Instructions:  Your physician has recommended you make the following change in your medication:   CHANGE the Metoprolol to 25 mg taking 1 twice a day  *If you need a refill on your cardiac medications before your next appointment, please call your pharmacy*   Lab Work: TODAY:  BMET & PRO BNP  If you have labs (blood work) drawn today and your tests are completely normal, you will receive your results only by: [place] (if you have MyChart) OR A paper copy in the mail If you have any lab test that is abnormal or we need to change your treatment, we will call you to review the results.   Testing/Procedures: Your physician has requested that you have an echocardiogram. Echocardiography is a painless test that uses sound waves to create images of your heart. It provides your doctor with information about the size and shape of your heart and how well your heart's chambers and valves are working. This procedure takes approximately one hour. There are no restrictions for this procedure. Please do NOT wear cologne, perfume, aftershave, or lotions (deodorant is allowed). Please arrive 15 minutes prior to your appointment time. Ho   Your physician has requested that you have cardiac CT. Cardiac computed tomography (CT) is a painless test that uses an x-ray machine to take clear, detailed pictures of your heart. For further information please visit [email or web address]. Please follow instruction sheet BELOW:    Your cardiac CT will be scheduled at one of the below locations:   [facility] [address] [place], [place] [address] [phone]   Please arrive at the [facility] (Entrance C2) of [facility] 30 minutes prior to test start time. You can use the FREE valet parking offered at entrance C (encouraged to control the heart rate for the test)  Proceed to the [facility] Radiology Department (first floor) to check-in and test  prep.  All radiology patients and guests should use entrance C2 at [facility], accessed from [facility], even though the hospital's physical address listed is [address].     Please follow these instructions carefully (unless otherwise directed):  On the Night Before the Test: Be sure to Drink plenty of water. Do not consume any caffeinated/decaffeinated beverages or chocolate 12 hours prior to your test. Do not take any antihistamines 12 hours prior to your test.  On the Day of the Test: Drink plenty of water until 1 hour prior to the test. Do not eat any food 1 hour prior to test. You may take your regular medications prior to the test.  Take 4 OF YOUR metoprolol (Lopressor) two hours prior to test.  YOU WILL SKIP THE MORNING AND EVENING DOSE THAT DAY  FEMALES- please wear underwire-free bra if available, avoid dresses & tight clothing       After the Test: Drink plenty of water. After receiving IV contrast, you may experience a mild flushed feeling. This is normal. On occasion, you may experience a mild rash up to 24 hours after the test. This is not dangerous. If this occurs, you can take Benadryl 25 mg and increase your fluid intake. If you experience trouble breathing, this can be serious. If it is severe call 911 IMMEDIATELY. If it is mild, please call our office.   We will call to schedule your test 2-4 weeks out understanding that some insurance companies will need an authorization prior to the service being performed.  For non-scheduling related questions, please contact the cardiac imaging nurse navigator should you have any questions/concerns: Marchia Bond, Cardiac Imaging Nurse Navigator Gordy Clement, Cardiac Imaging Nurse Navigator [facility] Direct Office Dial: [phone]   For scheduling needs, including cancellations and rescheduling, please call [place], [phone].      Follow-Up: At  [facility], you and your health needs are our priority.  As part of our continuing mission to provide you with exceptional heart care, we have created designated Provider Care Teams.  These Care Teams include your primary Cardiologist (physician) and Advanced Practice Providers (APPs -  Physician Assistants and Nurse Practitioners) who all work together to provide you with the care you need, when you need it.  We recommend signing up for the patient portal called "MyChart".  Sign up information is provided on this After Visit Summary.  MyChart is used to connect with patients for Virtual Visits (Telemedicine).  Patients are able to view lab/test results, encounter notes, upcoming appointments, etc.  Non-urgent messages can be sent to your provider as well.   To learn more about what you can do with MyChart, go to [email or web address].    Your next appointment:   1 year(s)  The format for your next appointment:   In Person  Provider:   Rudean Haskell, MD  or Richardson Dopp, PA-C         Other Instructions Your physician has requested that you regularly monitor and record your blood pressure readings at home. Please use the same machine at the same time of day to check your readings and record them to bring to your follow-up visit.   Please monitor blood pressures and keep a log of your readings for 2 weeks then send a mychart message or call with them.     Make sure to check 2 hours after your medications.    AVOID these things for 30 minutes before checking your blood pressure: No Drinking caffeine. No Drinking alcohol. No Eating. No Smoking. No Exercising.   Five minutes before checking your blood pressure: Pee. Sit in a dining chair. Avoid sitting in a soft couch or armchair. Be quiet. Do not talk    Important Information About Sugar

## 2022-04-08 NOTE — Assessment & Plan Note (Signed)
Continue CPAP.  She did ask if she needed to have another overnight oximetry.  I will send a message to the [place] with Dr. Radford Pax to reach out to the patient.

## 2022-04-08 NOTE — Assessment & Plan Note (Signed)
As noted, obtain BNP and arrange 2D echocardiogram.  I reviewed all the labs she brought from primary care.  Her hemoglobin is mildly low (11.3).  This is not likely low enough to contribute to shortness of breath or fatigue.  Also, her TSH was recently normal.

## 2022-04-08 NOTE — Assessment & Plan Note (Signed)
It was felt that her pulmonary hypertension was related to untreated sleep apnea.  She is now using CPAP regularly.  As she is complaining of shortness of breath, I will obtain a BNP and a follow-up echocardiogram.  If she has worsening pulmonary hypertension, consider VQ scan, PFTs and referral to heart failure clinic for evaluation.

## 2022-04-08 NOTE — Assessment & Plan Note (Signed)
Blood pressure uncontrolled.  She notes better readings at home (130s-140s).  Adjust metoprolol tartrate to 25 mg twice daily.  Continue losartan 100 mg daily.  Have asked her to send blood pressure readings in a couple of weeks.  If her blood pressure continues to run above target, consider increasing metoprolol versus changing losartan to a more potent ARB versus adding hydralazine.

## 2022-04-09 LAB — PRO B NATRIURETIC PEPTIDE: NT-Pro BNP: 251 pg/mL (ref 0–738)

## 2022-04-09 LAB — BASIC METABOLIC PANEL
BUN/Creatinine Ratio: 26 (ref 12–28)
BUN: 21 mg/dL (ref 8–27)
CO2: 23 mmol/L (ref 20–29)
Calcium: 10.4 mg/dL — ABNORMAL HIGH (ref 8.7–10.3)
Chloride: 101 mmol/L (ref 96–106)
Creatinine, Ser: 0.82 mg/dL (ref 0.57–1.00)
Glucose: 103 mg/dL — ABNORMAL HIGH (ref 70–99)
Potassium: 4.9 mmol/L (ref 3.5–5.2)
Sodium: 135 mmol/L (ref 134–144)
eGFR: 72 mL/min/{1.73_m2} (ref >59)

## 2022-04-09 NOTE — Addendum Note (Signed)
Addended by: Freada Bergeron on: 04/09/2022 06:44 PM   Modules accepted: Orders

## 2022-04-15 ENCOUNTER — Telehealth: Payer: Self-pay | Admitting: *Deleted

## 2022-04-15 NOTE — Telephone Encounter (Signed)
Another ONO test ordered and placed to [place].

## 2022-04-15 NOTE — Telephone Encounter (Signed)
Another ONO test ordered and placed to [place].

## 2022-04-15 NOTE — Addendum Note (Signed)
Addended by: Freada Bergeron on: 04/15/2022 03:50 PM   Modules accepted: Orders

## 2022-04-15 NOTE — Telephone Encounter (Signed)
-----   Message from Liliane Shi, [place] sent at 04/08/2022  3:15 PM EST ----- Gae Bon Can you check to see if she needs an updated overnight oximetry? She was describing a test she did last year and the monitor's battery died. She thought she was supposed to have a repeat test but never did. She was asking if she could repeat it. Can you reach out to her to see if she needs anything repeated? Thanks [employer]

## 2022-04-19 ENCOUNTER — Other Ambulatory Visit: Payer: Self-pay | Admitting: *Deleted

## 2022-04-19 DIAGNOSIS — Z79899 Other long term (current) drug therapy: Secondary | ICD-10-CM

## 2022-04-19 LAB — COMPLETE METABOLIC PANEL WITH GFR
AG Ratio: 1.8 (calc) (ref 1.0–2.5)
ALT: 14 U/L (ref 6–29)
AST: 27 U/L (ref 10–35)
Albumin: 4.2 g/dL (ref 3.6–5.1)
Alkaline phosphatase (APISO): 33 U/L — ABNORMAL LOW (ref 37–153)
BUN: 21 mg/dL (ref 7–25)
CO2: 23 mmol/L (ref 20–32)
Calcium: 10.3 mg/dL (ref 8.6–10.4)
Chloride: 102 mmol/L (ref 98–110)
Creat: 0.95 mg/dL (ref 0.60–0.95)
Globulin: 2.3 g/dL (calc) (ref 1.9–3.7)
Glucose, Bld: 89 mg/dL (ref 65–99)
Potassium: 4.9 mmol/L (ref 3.5–5.3)
Sodium: 133 mmol/L — ABNORMAL LOW (ref 135–146)
Total Bilirubin: 0.5 mg/dL (ref 0.2–1.2)
Total Protein: 6.5 g/dL (ref 6.1–8.1)
eGFR: 61 mL/min/{1.73_m2} (ref >=60)

## 2022-04-19 LAB — CBC WITH DIFFERENTIAL/PLATELET
Absolute Monocytes: 587 cells/uL (ref 200–950)
Basophils Absolute: 48 cells/uL (ref 0–200)
Basophils Relative: 0.7 %
Eosinophils Absolute: 41 cells/uL (ref 15–500)
Eosinophils Relative: 0.6 %
HCT: 31 % — ABNORMAL LOW (ref 35.0–45.0)
Hemoglobin: 10.5 g/dL — ABNORMAL LOW (ref 11.7–15.5)
Lymphs Abs: 2256 cells/uL (ref 850–3900)
MCH: 32.6 pg (ref 27.0–33.0)
MCHC: 33.9 g/dL (ref 32.0–36.0)
MCV: 96.3 fL (ref 80.0–100.0)
MPV: 9.8 fL (ref 7.5–12.5)
Monocytes Relative: 8.5 %
Neutro Abs: 3968 cells/uL (ref 1500–7800)
Neutrophils Relative %: 57.5 %
Platelets: 435 10*3/uL — ABNORMAL HIGH (ref 140–400)
RBC: 3.22 10*6/uL — ABNORMAL LOW (ref 3.80–5.10)
RDW: 12.9 % (ref 11.0–15.0)
Total Lymphocyte: 32.7 %
WBC: 6.9 10*3/uL (ref 3.8–10.8)

## 2022-04-20 NOTE — Progress Notes (Signed)
CBC and CMP are stable.  Sodium is low, hemoglobin is low.  Please advise patient to take multivitamin with iron.  Please forward results to her PCP.

## 2022-04-29 ENCOUNTER — Telehealth (HOSPITAL_COMMUNITY): Payer: Self-pay | Admitting: Emergency Medicine

## 2022-04-29 ENCOUNTER — Ambulatory Visit (HOSPITAL_COMMUNITY): Payer: Medicare PPO | Attending: Physician Assistant

## 2022-04-29 ENCOUNTER — Telehealth: Payer: Self-pay | Admitting: Family Medicine

## 2022-04-29 DIAGNOSIS — R079 Chest pain, unspecified: Secondary | ICD-10-CM | POA: Insufficient documentation

## 2022-04-29 DIAGNOSIS — I10 Essential (primary) hypertension: Secondary | ICD-10-CM | POA: Diagnosis not present

## 2022-04-29 DIAGNOSIS — I272 Pulmonary hypertension, unspecified: Secondary | ICD-10-CM | POA: Insufficient documentation

## 2022-04-29 DIAGNOSIS — I25119 Atherosclerotic heart disease of native coronary artery with unspecified angina pectoris: Secondary | ICD-10-CM | POA: Diagnosis not present

## 2022-04-29 DIAGNOSIS — E785 Hyperlipidemia, unspecified: Secondary | ICD-10-CM | POA: Diagnosis not present

## 2022-04-29 DIAGNOSIS — G4733 Obstructive sleep apnea (adult) (pediatric): Secondary | ICD-10-CM | POA: Insufficient documentation

## 2022-04-29 LAB — ECHOCARDIOGRAM COMPLETE
Area-P 1/2: 2.64 cm2
P 1/2 time: 463 msec
S' Lateral: 2.5 cm

## 2022-04-29 MED ORDER — GABAPENTIN 100 MG PO CAPS: 300.0000 mg | ORAL_CAPSULE | Freq: Every day | ORAL | 3 refills | Status: DC

## 2022-04-29 NOTE — Telephone Encounter (Signed)
Reaching out to patient to offer assistance regarding upcoming cardiac imaging study; pt verbalizes understanding of appt date/time, parking situation and where to check in, pre-test NPO status and medications ordered, and verified current allergies; name and call back number provided for further questions should they arise Marchia Bond RN Navigator Cardiac Imaging [facility] [phone] office [phone] cell  Arrival 1030  Daily meds Small veins

## 2022-04-29 NOTE — Telephone Encounter (Signed)
Pt is calling. Stated she needs a new prescription on medication  gabapentin (NEURONTIN) 100 MG capsule. She said it needs to say take three pills at night instead of two. Refill should be sent to [facility] #[id number]

## 2022-04-29 NOTE — Telephone Encounter (Signed)
Attempted to call patient regarding upcoming cardiac CT appointment. °Left message on voicemail with name and callback number °Hermie Reagor RN Navigator Cardiac Imaging °[facility] °[phone] Office °[phone] Cell ° °

## 2022-04-29 NOTE — Telephone Encounter (Signed)
Called pt back. She started having cramps/pain in feet/lower legs on '200mg'$  po qhs of gabapentin. She went back to '300mg'$  po qhs shortly after last visit. Tolerating well and stable. Not having any SE on medication. Wondering if Amy can update rx to be '300mg'$  po qhs instead?    [id number] [address], [address] [place] Phone: [phone]  Fax: [phone]

## 2022-04-30 ENCOUNTER — Other Ambulatory Visit: Payer: Self-pay

## 2022-04-30 MED ORDER — NEXLIZET 180-10 MG PO TABS: 1.0000 | ORAL_TABLET | Freq: Every day | ORAL | 3 refills | Status: DC

## 2022-04-30 NOTE — Progress Notes (Signed)
Office Visit Note  Patient: Felicia [name]             Date of Birth: 1940/10/19           MRN: [id number]             PCP: Kathyrn Lass, MD Referring: Kathyrn Lass, MD Visit Date: 05/13/2022 Occupation: _0 @  Subjective:  Follow-up (Low back pain, leg cramps at night)   History of Present Illness: Felicia [name] is a 81 y.o. female history of seropositive rheumatoid arthritis, osteoarthritis, degenerative disc disease and osteoporosis.  She states she had some appointments last couple of weeks ago and she was sitting for prolonged time.  She started having right trochanteric bursa and lower back pain.  She was seen by sports medicine and had right trochanteric bursa injection which was helpful.  She is continues to have some lower back pain.  She has an appointment coming up with the physical therapy.  She has been going to [facility] for [facility] and physical therapy.  She states tai chi has been very helpful for her.  But currently with the lower back pain she is not able to do much.  She has been taking Rinvoq 15 mg p.o. daily and methotrexate 4 tablets p.o. weekly which has been working well for her.  She did not have any interruption in her therapy.  She has not noticed any joint swelling.  She takes calcium rich diet and vitamin D.  Her last Reclast infusion was on November 06, 2021.  Activities of Daily Living:  Patient reports morning stiffness for 1 hour.   Patient Reports nocturnal pain.  Difficulty dressing/grooming: Denies Difficulty climbing stairs: Reports Difficulty getting out of chair: Denies Difficulty using hands for taps, buttons, cutlery, and/or writing: Reports  Review of Systems  Constitutional:  Positive for fatigue.  HENT:  Positive for mouth dryness. Negative for mouth sores.   Eyes:  Positive for dryness.  Respiratory:  Negative for difficulty breathing.   Cardiovascular:  Positive for chest pain. Negative for palpitations.       She was evaluated by the  cardiologist  Gastrointestinal:  Positive for constipation. Negative for blood in stool and diarrhea.  Endocrine: Positive for increased urination.  Genitourinary:  Positive for involuntary urination.  Musculoskeletal:  Positive for joint pain, gait problem, joint pain, myalgias, muscle weakness, morning stiffness and myalgias. Negative for joint swelling and muscle tenderness.  Skin:  Positive for hair loss and sensitivity to sunlight. Negative for color change and rash.  Allergic/Immunologic: Negative for susceptible to infections.  Neurological:  Positive for dizziness and headaches.  Hematological:  Negative for swollen glands.  Psychiatric/Behavioral:  Negative for depressed mood and sleep disturbance. The patient is nervous/anxious.     PMFS History:  Patient Active Problem List   Diagnosis Date Noted   Coronary artery disease involving native coronary artery with angina pectoris ([place]) 04/08/2022   Pulmonary HTN ([place]) 04/08/2022   Shortness of breath 04/08/2022   Greater trochanteric pain syndrome of right lower extremity 10/23/2021   Age-related osteoporosis without current pathological fracture 06/26/2021   Aplastic anemia ([place]) 06/26/2021   Hiatal hernia 06/26/2021   Prediabetes 06/26/2021   Neuropathy 06/16/2020   SIADH (syndrome of inappropriate ADH production) ([place]) 02/18/2020   Hyponatremia 11/18/2019   Thrombocytosis 08/01/2018   URI (upper respiratory infection) 04/14/2017   Primary osteoarthritis of both knees 03/06/2017   Exertional chest pain    Pain in the chest 03/27/2015  Postural hypotension 05/26/2013   OSA (obstructive sleep apnea) 03/22/2013   Lens replaced by other means 08/25/2012   Low tension open-angle glaucoma(365.12) 02/14/2012   Osteoarthritis 11/07/2011   Abdominal bruit 10/16/2011   Essential hypertension 08/13/2011   Anisocoria 08/13/2011   Cervical radiculopathy 07/17/2011   DDD (degenerative disc disease), cervical 07/10/2011   Glaucoma  06/06/2011   HIATAL HERNIA 01/30/2010   POSTMENOPAUSAL SYNDROME 12/14/2009   EUSTACHIAN TUBE DYSFUNCTION, RIGHT 06/28/2009   GERD 12/07/2008   Rheumatoid arthritis ([place]) 12/07/2008   COLONIC POLYPS, HX OF 12/07/2008   Hyperlipidemia LDL goal <70 10/17/2006   HYPERCALCEMIA 10/17/2006   Mitral valve disease 10/17/2006    Past Medical History:  Diagnosis Date   Adenomatous colon polyp 2004   Colo in 2009 "no polyps"   Complication of anesthesia    small airway per patient   Echocardiogram    Echo 8/22 EF 60-65, no RWMA, mild LVH, normal RVSF, moderately elevated PASP (RVSP 50.9), trivial MR, mild AI, AV sclerosis without stenosis   GERD (gastroesophageal reflux disease)    Glaucoma    Hiatal hernia    Hypertension    Neuropathy    Nuclear stress test    Myoview 8/22: EF 65, no ischemia; low risk   Rheumatoid arthritis(714.0)    [place]; Methotrexate (Dr. Patrecia Pour)   Sleep apnea    on CPAP    Family History  Problem Relation Age of Onset   Dementia Mother    Lung disease Mother        bronchiectasis   Heart disease Mother        Aortic Stenosis   Osteoporosis Mother    Heart attack Father 32       S/P CBAG   Ulcerative colitis Brother    Stroke Maternal Grandmother 83   Diabetes Neg Hx    Cancer Neg Hx    Colon cancer Neg Hx    Esophageal cancer Neg Hx    Stomach cancer Neg Hx    Pancreatic cancer Neg Hx    Liver disease Neg Hx    Past Surgical History:  Procedure Laterality Date   CARDIAC CATHETERIZATION N/A 03/29/2015   Procedure: Left Heart Cath and Coronary Angiography;  Surgeon: Belva Crome, MD;  Location: [facility];  Service: Cardiovascular;  Laterality: N/A;   CATARACT EXTRACTION, BILATERAL     COLONOSCOPY W/ POLYPECTOMY     X1; negative subsequently   DILATION AND CURETTAGE OF UTERUS     EYE SURGERY     due to RA   EYE SURGERY Left 06/2017   eyelid tendon    HYSTEROSCOPY WITH D & C  06/24/2012   Procedure: DILATATION AND CURETTAGE  /HYSTEROSCOPY;  Surgeon: [name], MD;  Location: [facility];  Service: Gynecology;  Laterality: N/A;   SHOULDER SURGERY Left    due to RA   SQUAMOUS CELL CARCINOMA EXCISION     scalp and left leg   TRABECULECTOMY     X 2 OD; X 1 OS   UPPER GASTROINTESTINAL ENDOSCOPY     hiatal hernia   Social History   Social History Narrative   Single   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, 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   3 caffeine/day   No tobacco, EtOH, drugs   Immunization History  Administered Date(s) Administered   Influenza Split 03/08/2014   Influenza Whole 04/07/2007, 02/24/2008, 03/10/2009, 03/03/2012   Influenza, High Dose Seasonal PF 01/15/2016, 03/07/2017, 01/27/2019   Influenza,inj,Quad PF,6+ Mos 03/15/2013, 02/21/2015, 03/17/2018   Influenza-Unspecified 03/01/2021  PFIZER(Purple Top)SARS-COV-2 Vaccination 06/24/2019, 07/13/2019, 01/15/2020, 06/26/2020   PPD Test 03/10/2012   Pneumococcal Conjugate-13 09/12/2015   Pneumococcal Polysaccharide-23 03/25/2003, 04/12/2008   Td 12/14/2009   Zoster Recombinat (Shingrix) 03/23/2018     Objective: Vital Signs: BP (!) 177/73 (BP Location: Left Arm, Patient Position: Sitting, Cuff Size: Normal)   Pulse (!) 58   Resp 16   Ht _0  (1.651 m)   Wt 138 lb (62.6 kg)   BMI 22.96 kg/m    Physical Exam Vitals and nursing note reviewed.  Constitutional:      Appearance: She is well-developed.  HENT:     Head: Normocephalic and atraumatic.  Eyes:     Conjunctiva/sclera: Conjunctivae normal.  Cardiovascular:     Rate and Rhythm: Normal rate and regular rhythm.     Heart sounds: Normal heart sounds.  Pulmonary:     Effort: Pulmonary effort is normal.     Breath sounds: Normal breath sounds.  Abdominal:     General: Bowel sounds are normal.     Palpations: Abdomen is soft.  Musculoskeletal:     Cervical back: Normal range of motion.  Lymphadenopathy:     Cervical: No cervical adenopathy.  Skin:    General: Skin is warm and dry.     Capillary Refill: Capillary refill  takes less than 2 seconds.  Neurological:     Mental Status: She is alert and oriented to person, place, and time.  Psychiatric:        Behavior: Behavior normal.      Musculoskeletal Exam: Cervical spine was in limited range of motion on lateral rotation.  She had no tenderness over thoracic or lumbar region.  She had been experiencing pain and discomfort in the lumbar region.  Shoulder joints, elbow joints, wrist joints, MCPs PIPs and DIPs Juengel range of motion with no synovitis.  She had thickening of PIP and DIP joints bilaterally.  Hip joints and knee joints were in good range of motion.  She had tenderness over right trochanteric bursa.  There was no tenderness over ankles or MTPs.  CDAI Exam: CDAI Score: -- Patient Global: 1 mm; Provider Global: 1 mm Swollen: --; Tender: -- Joint Exam 05/13/2022   No joint exam has been documented for this visit   There is currently no information documented on the homunculus. Go to the Rheumatology activity and complete the homunculus joint exam.  Investigation: No additional findings.  Imaging: CT CORONARY FRACTIONAL FLOW RESERVE FLUID ANALYSIS  Result Date: 05/01/2022 EXAM: FFRCT ANALYSIS FINDINGS: FFRct analysis was performed on the original cardiac CT angiogram dataset. Diagrammatic representation of the FFRct analysis is provided in a separate PDF document in PACS. This dictation was created using the PDF document and an interactive 3D model of the results. 3D model is not available in the EMR/PACS. Normal FFR range is >0.80. LM - normal LAD - normal Circ - normal RCA - normal IMPRESSION: Normal FFR.  No flow limitation LAD stenosis. Note: These examples are not recommendations of [employer] and only provided as examples of what other customers are doing. Electronically Signed   By: Candee Furbish M.D.   On: 05/01/2022 15:06   CT CORONARY MORPH W/CTA COR W/SCORE W/CA W/CM &/OR WO/CM  Addendum Date: 05/01/2022   ADDENDUM REPORT: 05/01/2022  14:23 ADDENDUM: OVER-READ INTERPRETATION  CT CHEST The following report is an over-read performed by radiologist Dr. Minerva Fester [facility] on 05/01/2022. This over-read does not include interpretation of cardiac or coronary anatomy or  pathology. The coronary calcium and coronary CT angiography interpretation by the cardiologist is attached. Imaging of the chest is focused on cardiac structures and excludes much of the chest on CT. COMPARISON: January 17, 2021 FINDINGS: Cardiovascular: Please see dedicated report for cardiovascular details Mediastinum/Nodes: No adenopathy or acute process in the mediastinum. Lungs/Pleura: Visualized lungs are clear and airways are patent to the extent evaluated. Mild basilar atelectasis. Upper Abdomen: Incidental imaging of upper abdominal contents without acute process. Musculoskeletal: No acute or destructive bone findings. IMPRESSION: No acute or significant extracardiac findings. Electronically Signed   By: Zetta Bills M.D.   On: 05/01/2022 14:23   Result Date: 05/01/2022 CLINICAL DATA:  81 year old with chest pain, known LAD disease mild to moderate. EXAM: Cardiac/Coronary  CTA TECHNIQUE: The patient was scanned on a [employer]. FINDINGS: A 120 kV prospective scan was triggered in the descending thoracic aorta at 111 HU's. Axial non-contrast 3 mm slices were carried out through the heart. The data set was analyzed on a dedicated work station and scored using the [place]. Gantry rotation speed was 250 msecs and collimation was .6 mm. 0.8 mg of sl NTG was given. The 3D data set was reconstructed in 5% intervals of the 67-82 % of the R-R cycle. Diastolic phases were analyzed on a dedicated work station using MPR, MIP and VRT modes. The patient received 80 cc of contrast. Image quality: good Aorta:  Normal size.  No calcifications.  No dissection. Aortic Valve:  Trileaflet.  No calcifications. Coronary Arteries:  Normal coronary origin.   Right dominance. RCA is a large dominant artery that gives rise to PDA and PLA scattered disease. There is no plaque. Left main is a large artery that gives rise to LAD and LCX arteries. LAD is a large vessel that has moderate calcified plaque, 50-69% proximal stenosis. Will send for FFR. Prior cath in 2016 40-45% Prox LAD LCX is a non-dominant artery that gives rise to one large OM1 branch. There is no plaque. Other findings: Normal pulmonary vein drainage into the left atrium. Normal left atrial appendage without a thrombus. Normal size of the pulmonary artery. Please see radiology report for non cardiac findings. IMPRESSION: 1. Coronary calcium score of 163. This was 77 percentile for age and sex matched control. 2. Normal coronary origin with right dominance. 3. Proximal LAD stenosis 50-69%, moderate. Sending for FFR. Prior cath in 2016 40-45% Prox LAD Electronically Signed: By: Candee Furbish M.D. On: 05/01/2022 13:22   ECHOCARDIOGRAM COMPLETE  Result Date: 04/29/2022    ECHOCARDIOGRAM REPORT   Patient Name:   Felicia [name]  Date of Exam: 04/29/2022 Medical Rec #:  [id number]     Height:       64.0 in Accession #:    [id number]    Weight:       137.4 lb Date of Birth:  September 10, 1940     BSA:          1.668 m Patient Age:    71 years      BP:           144/76 mmHg Patient Gender: F             HR:           68 bpm. Exam Location:  [place] Procedure: 2D Echo, Cardiac Doppler and Color Doppler Indications:    R07.9 Chest pain  History:        Patient has prior history of Echocardiogram examinations,  most                 recent 01/23/2021. CAD, Pulmonary HTN, AI, Signs/Symptoms:Chest                 Pain; Risk Factors:Hypertension and Dyslipidemia.  Sonographer:    Coralyn Helling RDCS Referring Phys: [place]  1. Left ventricular ejection fraction, by estimation, is 60 to 65%. The left ventricle has normal function. The left ventricle has no regional wall motion abnormalities. Left  ventricular diastolic parameters are consistent with Grade I diastolic dysfunction (impaired relaxation).  2. Right ventricular systolic function is normal. The right ventricular size is normal.  3. The mitral valve is normal in structure. Trivial mitral valve regurgitation. No evidence of mitral stenosis.  4. The aortic valve is normal in structure. Aortic valve regurgitation is mild. No aortic stenosis is present. Aortic regurgitation PHT measures 463 msec.  5. The inferior vena cava is normal in size with greater than 50% respiratory variability, suggesting right atrial pressure of 3 mmHg. FINDINGS  Left Ventricle: Left ventricular ejection fraction, by estimation, is 60 to 65%. The left ventricle has normal function. The left ventricle has no regional wall motion abnormalities. The left ventricular internal cavity size was normal in size. There is  no left ventricular hypertrophy. Left ventricular diastolic parameters are consistent with Grade I diastolic dysfunction (impaired relaxation). Normal left ventricular filling pressure. Right Ventricle: The right ventricular size is normal. No increase in right ventricular wall thickness. Right ventricular systolic function is normal. Left Atrium: Left atrial size was normal in size. Right Atrium: Right atrial size was normal in size. Pericardium: There is no evidence of pericardial effusion. Mitral Valve: The mitral valve is normal in structure. Trivial mitral valve regurgitation. No evidence of mitral valve stenosis. Tricuspid Valve: The tricuspid valve is normal in structure. Tricuspid valve regurgitation is mild . No evidence of tricuspid stenosis. Aortic Valve: The aortic valve is normal in structure. Aortic valve regurgitation is mild. Aortic regurgitation PHT measures 463 msec. No aortic stenosis is present. Pulmonic Valve: The pulmonic valve was normal in structure. Pulmonic valve regurgitation is trivial. No evidence of pulmonic stenosis. Aorta: The aortic  root is normal in size and structure. Venous: The inferior vena cava is normal in size with greater than 50% respiratory variability, suggesting right atrial pressure of 3 mmHg. IAS/Shunts: No atrial level shunt detected by color flow Doppler.  LEFT VENTRICLE PLAX 2D LVIDd:         4.40 cm   Diastology LVIDs:         2.50 cm   LV e' medial:    9.79 cm/s LV PW:         0.90 cm   LV E/e' medial:  9.2 LV IVS:        1.00 cm   LV e' lateral:   9.46 cm/s LVOT diam:     2.00 cm   LV E/e' lateral: 9.5 LV SV:         82 LV SV Index:   49 LVOT Area:     3.14 cm  RIGHT VENTRICLE             IVC RV S prime:     13.40 cm/s  IVC diam: 1.20 cm TAPSE (M-mode): 2.2 cm RVSP:           22.0 mmHg LEFT ATRIUM             Index  RIGHT ATRIUM           Index LA diam:        2.80 cm 1.68 cm/m   RA Pressure: 3.00 mmHg LA Vol (A2C):   37.1 ml 22.24 ml/m  RA Area:     8.19 cm LA Vol (A4C):   29.4 ml 17.63 ml/m  RA Volume:   14.70 ml  8.81 ml/m LA Biplane Vol: 33.4 ml 20.03 ml/m  AORTIC VALVE LVOT Vmax:   115.00 cm/s LVOT Vmean:  70.200 cm/s LVOT VTI:    0.260 m AI PHT:      463 msec  AORTA Ao Root diam: 3.00 cm Ao Asc diam:  3.00 cm MITRAL VALVE                TRICUSPID VALVE MV Area (PHT): 2.64 cm     TR Peak grad:   19.0 mmHg MV Decel Time: 287 msec     TR Vmax:        218.00 cm/s MV E velocity: 89.60 cm/s   Estimated RAP:  3.00 mmHg MV A velocity: 123.00 cm/s  RVSP:           22.0 mmHg MV E/A ratio:  0.73                             SHUNTS                             Systemic VTI:  0.26 m                             Systemic Diam: 2.00 cm Fransico Him MD Electronically signed by Fransico Him MD Signature Date/Time: 04/29/2022/3:41:13 PM    Final     Recent Labs: Lab Results  Component Value Date   WBC 6.9 04/19/2022   HGB 10.5 (L) 04/19/2022   PLT 435 (H) 04/19/2022   NA 133 (L) 04/19/2022   K 4.9 04/19/2022   CL 102 04/19/2022   CO2 23 04/19/2022   GLUCOSE 89 04/19/2022   BUN 21 04/19/2022   CREATININE 0.95  04/19/2022   BILITOT 0.5 04/19/2022   ALKPHOS 39 (L) 07/31/2021   AST 27 04/19/2022   ALT 14 04/19/2022   PROT 6.5 04/19/2022   ALBUMIN 4.6 07/31/2021   CALCIUM 10.3 04/19/2022   GFRAA 76 10/02/2020   QFTBGOLDPLUS INDETERMINATE (A) 01/22/2021     Speciality Comments: TB Gold: 01/22/2022 Neg  Reclast first infusion was given 08/10/2019, 09/18/2020, 11/06/21. Drug holiday x 2 years Rinvoq started September 06, 2020  Procedures:  No procedures performed Allergies: Sulfonamide derivatives, Amlodipine, and Norvasc [amlodipine besylate]   Assessment / Plan:     Visit Diagnoses: Rheumatoid arthritis involving multiple sites with positive rheumatoid factor ([place]) - +RF, +ANA: She has been doing well on Rinvoq and methotrexate in combination.  She denies any interruption in the treatment since the last visit.  She denies any joint pain or joint swelling.  She states she has been tolerating both medications well without any side effects.  High risk medication use - Rinvoq 15 mg 1 tablet by mouth daily, Methotrexate 4 tablets by mouth every 7 days, and folic acid 1 mg 2 tablets daily.Labs obtained on April 19, 2022 CBC showed hemoglobin of 10.5.  Platelets were mildly elevated at 435.  CMP was normal.  Patient  did not get TB Gold with November labs.  We will get TB Gold with the next labs in February.  Information regarding immunization was placed in the AVS.  She was also advised to hold Rinvoq and methotrexate if she develops an infection and resume after the infection resolves.  FDA blackbox warning on Rinvoq was also discussed regarding increased risk of arterial thrombosis, venous thrombosis cardiovascular death, MI and stroke.  Information was placed in the AVS.  Patient voiced understanding.  Primary osteoarthritis of both hands-she had bilateral PIP DIP and CMC prominence without synovitis.  Joint protection was discussed.  Trochanteric bursitis, right hip-she had a recent Ro of right trochanteric  bursitis due to prolonged sitting.  IT band stretches were demonstrated and discussed.  She is also going for physical therapy.  She had a recent trochanteric bursa injection which helped.  Primary osteoarthritis of both knees-she continues to have intermittent discomfort in her knee joints.  No warmth swelling or effusion was noted today.    DDD (degenerative disc disease), cervical-she denied any discomfort in the cervical region today.  DDD (degenerative disc disease), lumbar-she has been asked Raynauds increased lower back pain due to recent traveling and prolonged sitting.  She is going for physical therapy.  Age-related osteoporosis without current pathological fracture -December 03, 2021 T-score -2.1, BMD 0.571 in the right one third radius.  No comparison was available in the radius.  The BMD in the left total femur improved by 7% and AP total spine by 14%.  DEXA results were reviewed with the patient.  She received Reclast infusions in 2021, 2022 and 2023.  I advised her to go on a drug holiday.  We will discuss future Reclast infusions after her next DEXA scan in 2025.  Use of calcium rich diet with vitamin D was advised.  Regular exercise was advised.  DEXA 07/26/2019 right 1/3 distal radius BMD 0.54 with T score -2.5.  DEXA ordered by Dr. Sabra Heck in the past.Reclast IV infusion was on 11/06/21.  History of vitamin D deficiency-her vitamin D has been normal now.  Other medical problems are listed as follows:  History of squamous cell carcinoma  SIADH (syndrome of inappropriate ADH production) ([place])  History of sleep apnea  History of glaucoma  Neuropathy  Essential hypertension-blood pressure was elevated at 177/73 today.  Repeat blood pressure was also elevated.  She was advised to monitor blood pressure closely and follow-up with her PCP.  Coronary artery disease involving native coronary artery of native heart without angina pectoris  History of hyperlipidemia  History of colonic  polyps  History of gastroesophageal reflux (GERD)  Orders: Orders Placed This Encounter  Procedures   QuantiFERON-TB Gold Plus   No orders of the defined types were placed in this encounter.    Follow-Up Instructions: Return in about 5 months (around 10/12/2022) for Rheumatoid arthritis, Osteoarthritis, Osteoporosis.   Bo Merino, MD  Note - This record has been created using Editor, commissioning.  Chart creation errors have been sought, but may not always  have been located. Such creation errors do not reflect on  the standard of medical care.

## 2022-05-01 ENCOUNTER — Other Ambulatory Visit: Payer: Self-pay | Admitting: Cardiology

## 2022-05-01 ENCOUNTER — Ambulatory Visit (HOSPITAL_COMMUNITY)
Admission: RE | Admit: 2022-05-01 | Discharge: 2022-05-01 | Disposition: A | Discharge status: Home / Self Care | Payer: Medicare PPO | Source: Ambulatory Visit | Attending: Physician Assistant | Admitting: Physician Assistant

## 2022-05-01 ENCOUNTER — Ambulatory Visit (HOSPITAL_BASED_OUTPATIENT_CLINIC_OR_DEPARTMENT_OTHER)
Admission: RE | Admit: 2022-05-01 | Discharge: 2022-05-01 | Disposition: A | Discharge status: Home / Self Care | Payer: Medicare PPO | Source: Ambulatory Visit | Attending: Cardiology | Admitting: Cardiology

## 2022-05-01 DIAGNOSIS — R079 Chest pain, unspecified: Secondary | ICD-10-CM | POA: Insufficient documentation

## 2022-05-01 DIAGNOSIS — I272 Pulmonary hypertension, unspecified: Secondary | ICD-10-CM | POA: Diagnosis not present

## 2022-05-01 DIAGNOSIS — E785 Hyperlipidemia, unspecified: Secondary | ICD-10-CM | POA: Insufficient documentation

## 2022-05-01 DIAGNOSIS — I25119 Atherosclerotic heart disease of native coronary artery with unspecified angina pectoris: Secondary | ICD-10-CM | POA: Insufficient documentation

## 2022-05-01 DIAGNOSIS — G4733 Obstructive sleep apnea (adult) (pediatric): Secondary | ICD-10-CM | POA: Insufficient documentation

## 2022-05-01 DIAGNOSIS — I10 Essential (primary) hypertension: Secondary | ICD-10-CM | POA: Insufficient documentation

## 2022-05-01 DIAGNOSIS — R931 Abnormal findings on diagnostic imaging of heart and coronary circulation: Secondary | ICD-10-CM

## 2022-05-01 MED ORDER — IOHEXOL 350 MG/ML SOLN
100.0000 mL | Freq: Once | INTRAVENOUS | Status: AC | PRN
  Administered 2022-05-01: 100 mL via INTRAVENOUS

## 2022-05-01 MED ORDER — NITROGLYCERIN 0.4 MG SL SUBL
SUBLINGUAL_TABLET | SUBLINGUAL | Status: AC
  Filled 2022-05-01: qty 2

## 2022-05-01 MED ORDER — NITROGLYCERIN 0.4 MG SL SUBL
0.8000 mg | SUBLINGUAL_TABLET | Freq: Once | SUBLINGUAL | Status: AC
  Administered 2022-05-01: 0.8 mg via SUBLINGUAL

## 2022-05-02 ENCOUNTER — Telehealth: Payer: Self-pay

## 2022-05-02 DIAGNOSIS — H401233 Low-tension glaucoma, bilateral, severe stage: Secondary | ICD-10-CM | POA: Diagnosis not present

## 2022-05-02 MED ORDER — ISOSORBIDE MONONITRATE ER 30 MG PO TB24: 30.0000 mg | ORAL_TABLET | Freq: Every day | ORAL | 3 refills | Status: DC

## 2022-05-02 NOTE — Telephone Encounter (Signed)
The patient has been notified of the result and verbalized understanding.  All questions (if any) were answered. [place], RN 05/02/2022 9:17 AM   Pt wrote down results and medication instructions and read back to RN.  Pt will send in BP readings in 2 weeks and notify office of any questions or concerns.

## 2022-05-02 NOTE — Telephone Encounter (Signed)
Called pt advised not to take aspirin d/t methotrexate.  Pt verbalizes understanding no questions or concerns voiced.

## 2022-05-02 NOTE — Telephone Encounter (Signed)
-----   Message from Liliane Shi, [place] sent at 05/02/2022  8:06 AM EST ----- Results sent to Bethanie Dicker via [facility]. See MyChart comment below. PLAN: -Start Imdur 15 mg once daily x 3 days, then 30 mg once daily. -Send copy to PCP  Richardson Dopp, PA-C    05/02/2022 7:53 AM    Ms. Lienhard  Your CT shows moderate plaque in one artery (LAD). Further testing (FFR) was done on this and that test shows that the blood flow across this plaque is normal. This is good news.   We still need to treat risk factors to make sure this plaque does not get worse. Continue your cholesterol medication. If you can tolerate taking an aspirin, start Aspirin 81 mg once daily.   I reviewed your blood pressure readings. They are above goal. I will start you on Isosorbide mononitrate (Imdur) 30 mg take 1/2 tab for 3 days, then take 1 tab once daily. One side effect is a headache. It usually resolves over a few days.  Richardson Dopp, PA-C    05/02/2022 7:53 AM

## 2022-05-02 NOTE — Telephone Encounter (Signed)
Thank you for letting me know. It looks like the risks of taking ASA and methotrexate outweigh benefits for her.  PLAN: -DC ASA Richardson Dopp, PA-C    05/02/2022 9:25 AM

## 2022-05-06 ENCOUNTER — Ambulatory Visit (INDEPENDENT_AMBULATORY_CARE_PROVIDER_SITE_OTHER): Payer: Medicare PPO | Admitting: Family Medicine

## 2022-05-06 ENCOUNTER — Encounter (HOSPITAL_BASED_OUTPATIENT_CLINIC_OR_DEPARTMENT_OTHER): Payer: Self-pay | Admitting: Family Medicine

## 2022-05-06 VITALS — BP 151/59 | HR 61 | Temp 97.6°F | Ht 64.0 in | Wt 141.0 lb

## 2022-05-06 DIAGNOSIS — M545 Low back pain, unspecified: Secondary | ICD-10-CM

## 2022-05-06 DIAGNOSIS — M25551 Pain in right hip: Secondary | ICD-10-CM | POA: Diagnosis not present

## 2022-05-06 NOTE — Progress Notes (Signed)
    Procedures performed today:    Procedure: Injection of the right greater trochanteric bursa Verbal informed consent obtained.  Time-out conducted.  Noted no overlying erythema, induration, or other signs of local infection.  Skin prepped in a sterile fashion.  Local anesthesia: Topical Ethyl chloride.  With sterile technique: 1 cc Kenalog 40, 4 cc 1% lidocaine injected easily Completed without difficulty  Advised to call if fevers/chills, erythema, induration, drainage, or persistent bleeding. Impression: Technically successful injection.  Independent interpretation of notes and tests performed by another provider:   None.  Brief History, Exam, Impression, and Recommendations:    BP (!) 151/59 (BP Location: Right Arm, Patient Position: Sitting, Cuff Size: Large)   Pulse 61   Temp 97.6 F (36.4 C) (Oral)   Ht '5\' 4"'$  (1.626 m)   Wt 151 lb (68.5 kg)   SpO2 100%   BMI 25.92 kg/m   Greater trochanteric pain syndrome of right lower extremity Patient presents for evaluation of right hip pain.  She has had previous similar issues in the past.  Previously she has had steroid injection at right greater trochanter with good relief.  She is also worked with physical therapy in the past and found relief with this as well.  She presents today due to return of symptoms.  She reports that she did have increased activity around her house and also had recent office visits with various providers where she had a lot of sitting and waiting and also had echocardiogram done and was laying on her side for a period of time and feels that these things may have aggravated her right hip and right low back.  She has not had any numbness or tingling.  She admits that she has not been quite as diligent with her home exercise program. On exam, patient is in no acute distress.  She does have tenderness to palpation over the right greater trochanter as well as some in the area of right SI joint.  She does have  tenderness to palpation extending along IT band, tenderness palpation more so proximally within IT band.  Negative FABER and FADIR.  Some pain with pelvic distraction on right side in the area of SI joint.  Negative logroll. Suspect that she has had flareup of pain over greater trochanter.  Also feel that there is an SI joint component of current symptoms. We discussed options today, she would like to proceed with repeat steroid injection at greater trochanteric bursa.  She would like to proceed with home exercises.  Also discussed possibly working with physical therapy again which she may be interested in.  Referral placed today so that she may schedule this at her preference.  Discussed that if low back pain/SI joint pain persists, can also consider further evaluation with spine specialist.  She does indicate that she had seen Dr. Ernestina Patches in the past. We will plan for follow-up in about 6 to 8 weeks to monitor progress or sooner as needed  Return in about 6 weeks (around 06/17/2022) for right hip, low back.   ___________________________________________ Felicia Wiederholt de [place], MD, ABFM, [facility] and [place]

## 2022-05-06 NOTE — Patient Instructions (Signed)
  Medication Instructions:  Your physician recommends that you continue on your current medications as directed. Please refer to the Current Medication list given to you today. --If you need a refill on any your medications before your next appointment, please call your pharmacy first. If no refills are authorized on file call the office.-- Lab Work: Your physician has recommended that you have lab work today: No If you have labs (blood work) drawn today and your tests are completely normal, you will receive your results via [facility] a phone call from our staff.  Please ensure you check your voicemail in the event that you authorized detailed messages to be left on a delegated number. If you have any lab test that is abnormal or we need to change your treatment, we will call you to review the results.  Referrals/Procedures/Imaging: No  Follow-Up: Your next appointment:   Your physician recommends that you schedule a follow-up appointment in: 6-8 weeks with Dr. de [place].  You will receive a text message or e-mail with a link to a survey about your care and experience with [place] today! We would greatly appreciate your feedback!   Thanks for letting us be apart of your health journey!!  Primary Care and Sports Medicine   Dr. Arlina Robes [place]   We encourage you to activate your patient portal called "MyChart".  Sign up information is provided on this After Visit Summary.  MyChart is used to connect with patients for Virtual Visits (Telemedicine).  Patients are able to view lab/test results, encounter notes, upcoming appointments, etc.  Non-urgent messages can be sent to your provider as well. To learn more about what you can do with MyChart, please visit --  [email or web address].

## 2022-05-06 NOTE — Assessment & Plan Note (Signed)
Patient presents for evaluation of right hip pain.  She has had previous similar issues in the past.  Previously she has had steroid injection at right greater trochanter with good relief.  She is also worked with physical therapy in the past and found relief with this as well.  She presents today due to return of symptoms.  She reports that she did have increased activity around her house and also had recent office visits with various providers where she had a lot of sitting and waiting and also had echocardiogram done and was laying on her side for a period of time and feels that these things may have aggravated her right hip and right low back.  She has not had any numbness or tingling.  She admits that she has not been quite as diligent with her home exercise program. On exam, patient is in no acute distress.  She does have tenderness to palpation over the right greater trochanter as well as some in the area of right SI joint.  She does have tenderness to palpation extending along IT band, tenderness palpation more so proximally within IT band.  Negative FABER and FADIR.  Some pain with pelvic distraction on right side in the area of SI joint.  Negative logroll. Suspect that she has had flareup of pain over greater trochanter.  Also feel that there is an SI joint component of current symptoms. We discussed options today, she would like to proceed with repeat steroid injection at greater trochanteric bursa.  She would like to proceed with home exercises.  Also discussed possibly working with physical therapy again which she may be interested in.  Referral placed today so that she may schedule this at her preference.  Discussed that if low back pain/SI joint pain persists, can also consider further evaluation with spine specialist.  She does indicate that she had seen Dr. Ernestina Patches in the past. We will plan for follow-up in about 6 to 8 weeks to monitor progress or sooner as needed

## 2022-05-11 ENCOUNTER — Other Ambulatory Visit: Payer: Self-pay | Admitting: Physician Assistant

## 2022-05-11 DIAGNOSIS — M0579 Rheumatoid arthritis with rheumatoid factor of multiple sites without organ or systems involvement: Secondary | ICD-10-CM

## 2022-05-13 ENCOUNTER — Encounter: Payer: Self-pay | Admitting: Rheumatology

## 2022-05-13 ENCOUNTER — Ambulatory Visit: Payer: Medicare PPO | Attending: Rheumatology | Admitting: Rheumatology

## 2022-05-13 VITALS — BP 177/73 | HR 58 | Resp 16 | Ht 65.0 in | Wt 138.0 lb

## 2022-05-13 DIAGNOSIS — M5030 Other cervical disc degeneration, unspecified cervical region: Secondary | ICD-10-CM

## 2022-05-13 DIAGNOSIS — M19042 Primary osteoarthritis, left hand: Secondary | ICD-10-CM

## 2022-05-13 DIAGNOSIS — M810 Age-related osteoporosis without current pathological fracture: Secondary | ICD-10-CM

## 2022-05-13 DIAGNOSIS — I10 Essential (primary) hypertension: Secondary | ICD-10-CM

## 2022-05-13 DIAGNOSIS — E222 Syndrome of inappropriate secretion of antidiuretic hormone: Secondary | ICD-10-CM

## 2022-05-13 DIAGNOSIS — I2510 Atherosclerotic heart disease of native coronary artery without angina pectoris: Secondary | ICD-10-CM

## 2022-05-13 DIAGNOSIS — M19041 Primary osteoarthritis, right hand: Secondary | ICD-10-CM

## 2022-05-13 DIAGNOSIS — Z79899 Other long term (current) drug therapy: Secondary | ICD-10-CM

## 2022-05-13 DIAGNOSIS — Z860100 Personal history of colon polyps, unspecified: Secondary | ICD-10-CM

## 2022-05-13 DIAGNOSIS — Z8669 Personal history of other diseases of the nervous system and sense organs: Secondary | ICD-10-CM

## 2022-05-13 DIAGNOSIS — M5136 Other intervertebral disc degeneration, lumbar region: Secondary | ICD-10-CM

## 2022-05-13 DIAGNOSIS — M79671 Pain in right foot: Secondary | ICD-10-CM

## 2022-05-13 DIAGNOSIS — M79672 Pain in left foot: Secondary | ICD-10-CM

## 2022-05-13 DIAGNOSIS — M7061 Trochanteric bursitis, right hip: Secondary | ICD-10-CM | POA: Diagnosis not present

## 2022-05-13 DIAGNOSIS — Z8589 Personal history of malignant neoplasm of other organs and systems: Secondary | ICD-10-CM

## 2022-05-13 DIAGNOSIS — Z8639 Personal history of other endocrine, nutritional and metabolic disease: Secondary | ICD-10-CM | POA: Diagnosis not present

## 2022-05-13 DIAGNOSIS — M170 Bilateral primary osteoarthritis of knee: Secondary | ICD-10-CM

## 2022-05-13 DIAGNOSIS — M51369 Other intervertebral disc degeneration, lumbar region without mention of lumbar back pain or lower extremity pain: Secondary | ICD-10-CM

## 2022-05-13 DIAGNOSIS — G629 Polyneuropathy, unspecified: Secondary | ICD-10-CM

## 2022-05-13 DIAGNOSIS — M0579 Rheumatoid arthritis with rheumatoid factor of multiple sites without organ or systems involvement: Secondary | ICD-10-CM

## 2022-05-13 DIAGNOSIS — Z8719 Personal history of other diseases of the digestive system: Secondary | ICD-10-CM

## 2022-05-13 NOTE — Patient Instructions (Addendum)
Standing Labs We placed an order today for your standing lab work.   Please have your standing labs drawn in February and every 3 months TB Gold with next lab  Please have your labs drawn 2 weeks prior to your appointment so that the provider can discuss your lab results at your appointment.  Please note that you may see your imaging and lab results in [facility] before we have reviewed them. We will contact you once all results are reviewed. Please allow our office up to 72 hours to thoroughly review all of the results before contacting the office for clarification of your results.  Lab hours are:   Monday through Thursday from 8:00 am -12:30 pm and 1:00 pm-5:00 pm and Friday from 8:00 am-12:00 pm.  Please be advised, all patients with office appointments requiring lab work will take precedent over walk-in lab work.   Labs are drawn by [facility]. Please bring your co-pay at the time of your lab draw.  You may receive a bill from [facility] for your lab work.  Please note if you are on Hydroxychloroquine and and an order has been placed for a Hydroxychloroquine level, you will need to have it drawn 4 hours or more after your last dose.  If you wish to have your labs drawn at another location, please call the office 24 hours in advance so we can fax the orders.  The office is located at [address], [place], [place], [place] [address] No appointment is necessary.    If you have any questions regarding directions or hours of operation,  please call [phone].   As a reminder, please drink plenty of water prior to coming for your lab work. Thanks!   Vaccines You are taking a medication(s) that can suppress your immune system.  The following immunizations are recommended: Flu annually Covid-19  Td/Tdap (tetanus, diphtheria, pertussis) every 10 years Pneumonia (Prevnar 15 then Pneumovax 23 at least 1 year apart.  Alternatively, can take Prevnar 20 without needing additional  dose) Shingrix: 2 doses from 4 weeks to 6 months apart  Please check with your PCP to make sure you are up to date.   If you have signs or symptoms of an infection or start antibiotics: First, call your PCP for workup of your infection. Hold your medication through the infection, until you complete your antibiotics, and until symptoms resolve if you take the following: Injectable medication (Actemra, Benlysta, Cimzia, Cosentyx, Enbrel, Humira, Kevzara, Orencia, Remicade, Simponi, Stelara, Taltz, Tremfya) Methotrexate Leflunomide (Arava) Mycophenolate (Cellcept) Roma Kayser, or Rinvoq   Because you are taking Morrie Sheldon, Rinvoq, or Olumiant, it is very important to know that this class of medications has a FDA BLACK BOX WARNING for major adverse cardiovascular events (MACE), thrombosis, mortality (including sudden cardiovascular death), serious infections, and lymphomas. MACE is defined as cardiovascular death, myocardial infarction, and stroke. Thrombosis includes deep venous thrombosis (DVT), pulmonary embolism (PE), and arterial thrombosis. If you are a current or former smoker, you are at higher risk for MACE.

## 2022-05-13 NOTE — Telephone Encounter (Signed)
Next Visit: 10/15/2022  Last Visit: 05/13/2022  Last Fill: 01/21/2022  UP:JSRPRXYVOP arthritis involving multiple sites with positive rheumatoid factor   Current Dose per office note on 05/13/2022: Rinvoq 15 mg 1 tablet by mouth daily   Labs: 04/19/2022 CBC and CMP are stable.  Sodium is low, hemoglobin is low.  Please advise patient to take multivitamin with iron.   TB Gold: 01/18/2022 negative    Okay to refill rinvoq?

## 2022-06-03 DIAGNOSIS — J069 Acute upper respiratory infection, unspecified: Secondary | ICD-10-CM | POA: Diagnosis not present

## 2022-06-03 DIAGNOSIS — G4733 Obstructive sleep apnea (adult) (pediatric): Secondary | ICD-10-CM | POA: Diagnosis not present

## 2022-06-05 ENCOUNTER — Other Ambulatory Visit: Payer: Self-pay | Admitting: Physician Assistant

## 2022-06-05 NOTE — Telephone Encounter (Signed)
Next Visit: 10/15/2022  Last Visit: 05/13/2022  Last Fill: 02/27/2022  DX: Rheumatoid arthritis involving multiple sites with positive rheumatoid factor   Current Dose per office note 05/13/2022: Methotrexate 4 tablets by mouth every 7 days   Labs: 04/19/2022 CBC and CMP are stable.  Sodium is low, hemoglobin is low.     Okay to refill MTX?

## 2022-06-11 ENCOUNTER — Other Ambulatory Visit: Payer: Self-pay | Admitting: Internal Medicine

## 2022-06-20 DIAGNOSIS — H401233 Low-tension glaucoma, bilateral, severe stage: Secondary | ICD-10-CM | POA: Diagnosis not present

## 2022-06-20 DIAGNOSIS — H409 Unspecified glaucoma: Secondary | ICD-10-CM | POA: Diagnosis not present

## 2022-06-21 ENCOUNTER — Ambulatory Visit (INDEPENDENT_AMBULATORY_CARE_PROVIDER_SITE_OTHER): Payer: Medicare PPO | Admitting: Family Medicine

## 2022-06-21 ENCOUNTER — Encounter (HOSPITAL_BASED_OUTPATIENT_CLINIC_OR_DEPARTMENT_OTHER): Payer: Self-pay

## 2022-06-21 ENCOUNTER — Encounter (HOSPITAL_BASED_OUTPATIENT_CLINIC_OR_DEPARTMENT_OTHER): Payer: Self-pay | Admitting: Family Medicine

## 2022-06-21 VITALS — BP 153/66 | HR 59 | Ht 65.0 in | Wt 138.6 lb

## 2022-06-21 DIAGNOSIS — M25551 Pain in right hip: Secondary | ICD-10-CM

## 2022-06-21 DIAGNOSIS — M545 Low back pain, unspecified: Secondary | ICD-10-CM | POA: Diagnosis not present

## 2022-06-21 NOTE — Progress Notes (Signed)
    Procedures performed today:    None.  Independent interpretation of notes and tests performed by another provider:   None.  Brief History, Exam, Impression, and Recommendations:    BP (!) 153/66 (BP Location: Left Arm, Patient Position: Sitting, Cuff Size: Normal)   Pulse (!) 59   Ht '5\' 5"'$  (1.651 m)   Wt 138 lb 9.6 oz (62.9 kg)   SpO2 99%   BMI 23.06 kg/m   Greater trochanteric pain syndrome of right lower extremity At last visit, patient did have local steroid injection completed.  Right hip has generally been doing fairly well.  Primary concern has more so than related to right low back which is discussed separately below.  She does continue with home exercises and feels that she has been doing fairly well in regards to her hip.  She does not have specific concerns today related to this.  Right-sided low back pain without sciatica She reports that she has been having some increased difficulty with her low back, right side.  She has been doing home exercises as well as applying topical medications utilizing Tylenol but without significant relief.  She has worked with spine specialist in the past, Dr. Ernestina Patches.  She has received local injections with variable relief of symptoms.  Prior MRI completed, this was done in 2022.  At that time, she did have evidence of degenerative changes as well as some left sided nerve impingement. We discussed options today related to management considerations.  Given persistent symptoms, consideration can be given to proceeding with further evaluation with PT in the office.  Additionally could have a reevaluation with spine specialist, either with Dr. Alfonse Spruce or with alternative provider for a second opinion.  Today, she would like to proceed with referral to PT for consideration of additional modalities to help with controlling symptoms.  She will consider further evaluation with spine specialist and if she would like referral to alternative office, she will  let [place] know.  We did discuss possibility of referral to [facility]  Spent 32 minutes on this patient encounter, including preparation, chart review, face-to-face counseling with patient and coordination of care, and documentation of encounter  Return if symptoms worsen or fail to improve.   ___________________________________________ Felicia [name] de [place], MD, ABFM, [facility] and [place]

## 2022-06-21 NOTE — Assessment & Plan Note (Signed)
At last visit, patient did have local steroid injection completed.  Right hip has generally been doing fairly well.  Primary concern has more so than related to right low back which is discussed separately below.  She does continue with home exercises and feels that she has been doing fairly well in regards to her hip.  She does not have specific concerns today related to this.

## 2022-06-21 NOTE — Assessment & Plan Note (Signed)
She reports that she has been having some increased difficulty with her low back, right side.  She has been doing home exercises as well as applying topical medications utilizing Tylenol but without significant relief.  She has worked with spine specialist in the past, Dr. Ernestina Patches.  She has received local injections with variable relief of symptoms.  Prior MRI completed, this was done in 2022.  At that time, she did have evidence of degenerative changes as well as some left sided nerve impingement. We discussed options today related to management considerations.  Given persistent symptoms, consideration can be given to proceeding with further evaluation with PT in the office.  Additionally could have a reevaluation with spine specialist, either with Dr. Alfonse Spruce or with alternative provider for a second opinion.  Today, she would like to proceed with referral to PT for consideration of additional modalities to help with controlling symptoms.  She will consider further evaluation with spine specialist and if she would like referral to alternative office, she will let [place] know.  We did discuss possibility of referral to [facility]

## 2022-06-24 ENCOUNTER — Ambulatory Visit: Payer: Medicare PPO | Admitting: Family Medicine

## 2022-06-24 ENCOUNTER — Ambulatory Visit: Payer: Medicare PPO | Admitting: Diagnostic Neuroimaging

## 2022-06-25 ENCOUNTER — Encounter: Payer: Self-pay | Admitting: Cardiology

## 2022-06-25 ENCOUNTER — Telehealth: Payer: Self-pay | Admitting: *Deleted

## 2022-06-25 ENCOUNTER — Ambulatory Visit: Payer: Medicare PPO | Attending: Cardiology | Admitting: Cardiology

## 2022-06-25 VITALS — BP 133/61 | HR 63 | Ht 60.0 in | Wt 134.0 lb

## 2022-06-25 DIAGNOSIS — I10 Essential (primary) hypertension: Secondary | ICD-10-CM | POA: Diagnosis not present

## 2022-06-25 DIAGNOSIS — I25119 Atherosclerotic heart disease of native coronary artery with unspecified angina pectoris: Secondary | ICD-10-CM

## 2022-06-25 DIAGNOSIS — I272 Pulmonary hypertension, unspecified: Secondary | ICD-10-CM

## 2022-06-25 DIAGNOSIS — G4733 Obstructive sleep apnea (adult) (pediatric): Secondary | ICD-10-CM

## 2022-06-25 NOTE — Progress Notes (Signed)
Virtual Visit via Video Note   This visit type was conducted due to national recommendations for restrictions regarding the COVID-19 Pandemic (e.g. social distancing) in an effort to limit this patient's exposure and mitigate transmission in our community.  Due to her co-morbid illnesses, this patient is at least at moderate risk for complications without adequate follow up.  This format is felt to be most appropriate for this patient at this time.  All issues noted in this document were discussed and addressed.  A limited physical exam was performed with this format.  Please refer to the patient's chart for her consent to telehealth for [facility].   Date:  06/25/2022   ID:  Felicia [name], DOB [birth year], MRN [id number] The patient was identified using 2 identifiers.  Patient Location: Home Provider Location: Home Office   PCP:  Kathyrn Lass, MD   [place] [facility] Cardiologist:  Werner Lean, MD Cardiology APP:  Liliane Shi, PA-C     Evaluation Performed:  Follow-Up Visit  Chief Complaint:  OSA  History of Present Illness:    Felicia [name] is a 82 y.o. female with a hx of GERD, HTN, RA and pulmonary HTN who was referred for sleep study.  Home sleep study showed mild OSA with an AHI of 12.2/hr and nocturnal hypoxemia with Nadir O2 sat 79% and O2 sats < 88% for 17 minutes.  Patient was started on auto CPAP from 4 to 15 cm H2O.  ONO on CPAP did not show any nocturnal hypoxemia.  She is now back for follow-up.  She is doing well with her PAP device and thinks that she has gotten used to it.  She tolerates the nasal mask  but leaves a pressure point on the bridge of her nose and also her tubing gets wrapped around her.  She feels the pressure is adequate.  Since going on PAP she feels rested in the am but has interrupted sleep at night due to leg cramps.  She does nap after lunch.  She has mild mouth but no nasal dryness or nasal congestion.  She does not think that  he snores.     Past Medical History:  Diagnosis Date   Adenomatous colon polyp 2004   Colo in 2009 "no polyps"   Complication of anesthesia    small airway per patient   Echocardiogram    Echo 8/22 EF 60-65, no RWMA, mild LVH, normal RVSF, moderately elevated PASP (RVSP 50.9), trivial MR, mild AI, AV sclerosis without stenosis   GERD (gastroesophageal reflux disease)    Glaucoma    Hiatal hernia    Hypertension    Neuropathy    Nuclear stress test    Myoview 8/22: EF 65, no ischemia; low risk   Rheumatoid arthritis(714.0)    Orencia; Methotrexate (Dr. Patrecia Pour)   Sleep apnea    on CPAP   Past Surgical History:  Procedure Laterality Date   CARDIAC CATHETERIZATION N/A 03/29/2015   Procedure: Left Heart Cath and Coronary Angiography;  Surgeon: Belva Crome, MD;  Location: [facility];  Service: Cardiovascular;  Laterality: N/A;   CATARACT EXTRACTION, BILATERAL     COLONOSCOPY W/ POLYPECTOMY     X1; negative subsequently   DILATION AND CURETTAGE OF UTERUS     EYE SURGERY     due to RA   EYE SURGERY Left 06/2017   eyelid tendon    HYSTEROSCOPY WITH D & C  06/24/2012   Procedure: DILATATION AND  CURETTAGE /HYSTEROSCOPY;  Surgeon: [name], MD;  Location: [facility];  Service: Gynecology;  Laterality: N/A;   SHOULDER SURGERY Left    due to RA   SQUAMOUS CELL CARCINOMA EXCISION     scalp and left leg   TRABECULECTOMY     X 2 OD; X 1 OS   UPPER GASTROINTESTINAL ENDOSCOPY     hiatal hernia     No outpatient medications have been marked as taking for the 06/25/22 encounter (Video Visit) with Felicia Margarita, MD.     Allergies:   Sulfonamide derivatives, Amlodipine, and Norvasc [amlodipine besylate]   Social History   Tobacco Use   Smoking status: Former    Packs/day: 0.50    Years: 5.00    Total pack years: 2.50    Types: Cigarettes    Quit date: 06/04/1971    Years since quitting: 51.0    Passive exposure: Never   Smokeless tobacco: Never  Vaping Use    Vaping Use: Never used  Substance Use Topics   Alcohol use: No   Drug use: No     Family Hx: The patient's family history includes Dementia in her mother; Heart attack (age of onset: 60) in her father; Heart disease in her mother; Lung disease in her mother; Osteoporosis in her mother; Stroke (age of onset: 36) in her maternal grandmother; Ulcerative colitis in her brother. There is no history of Diabetes, Cancer, Colon cancer, Esophageal cancer, Stomach cancer, Pancreatic cancer, or Liver disease.  ROS:   Please see the history of present illness.     All other systems reviewed and are negative.   Prior CV studies:   The following studies were reviewed today:  Home sleep study  Labs/Other Tests and Data Reviewed:    EKG:  No ECG reviewed.  Recent Labs: 04/08/2022: NT-Pro BNP 251 04/19/2022: ALT 14; BUN 21; Creat 0.95; Hemoglobin 10.5; Platelets 435; Potassium 4.9; Sodium 133   Recent Lipid Panel Lab Results  Component Value Date/Time   CHOL 154 07/31/2021 09:07 AM   TRIG 84 07/31/2021 09:07 AM   HDL 60 07/31/2021 09:07 AM   CHOLHDL 2.6 07/31/2021 09:07 AM   CHOLHDL 3 09/02/2012 08:26 AM   LDLCALC 78 07/31/2021 09:07 AM   LDLDIRECT 160.4 12/14/2009 12:06 PM    Wt Readings from Last 3 Encounters:  06/25/22 134 lb (60.8 kg)  06/21/22 138 lb 9.6 oz (62.9 kg)  05/13/22 138 lb (62.6 kg)     Risk Assessment/Calculations:          Objective:    Vital Signs:  BP 133/61   Pulse 63   Ht 5' (1.524 m)   Wt 134 lb (60.8 kg)   BMI 26.17 kg/m   Well nourished, well developed female in no  acute distress. Well appearing, alert and conversant, regular work of breathing,  good skin color  Eyes- anicteric mouth- oral mucosa is pink  neuro- grossly intact skin- no apparent rash or lesions or cyanosis ASSESSMENT & PLAN:    OSA - The patient is tolerating PAP therapy well without any problems. The PAP download performed by his DME was personally reviewed and interpreted  by me today and showed an AHI of 0.2/hr on auto CPAP from 4 to 15 cm H2O with 100% compliance in using more than 4 hours nightly.  The patient has been using and benefiting from PAP use and will continue to benefit from therapy.  -I will order a nasal pillow mask that comes off  the top of her head to see if that is more comfortable  HTN -BP controlled on exam today -Continue prescription drug management with losartan 100 mg daily, Lopressor 25 mg twice daily with as needed refills  Time:   Today, I have spent 15 minutes with the patient with telehealth technology discussing the above problems.     Medication Adjustments/Labs and Tests Ordered: Current medicines are reviewed at length with the patient today.  Concerns regarding medicines are outlined above.   Tests Ordered: No orders of the defined types were placed in this encounter.    Medication Changes: No orders of the defined types were placed in this encounter.    Follow Up:  In Person in 6 week(s) after she gets her PAP device  Signed, Fransico Him, MD  06/25/2022 8:44 AM    [place]

## 2022-06-25 NOTE — Telephone Encounter (Signed)
Order placed to [facility] via community message 

## 2022-06-25 NOTE — Telephone Encounter (Signed)
Per Dr Radford Pax, order a ResMEd nasal pillow mask with tubing coming off the top of her head

## 2022-06-25 NOTE — Patient Instructions (Signed)
Medication Instructions:  Your physician recommends that you continue on your current medications as directed. Please refer to the Current Medication list given to you today.  *If you need a refill on your cardiac medications before your next appointment, please call your pharmacy*  Follow-Up: At [facility], you and your health needs are our priority.  As part of our continuing mission to provide you with exceptional heart care, we have created designated Provider Care Teams.  These Care Teams include your primary Cardiologist (physician) and Advanced Practice Providers (APPs -  Physician Assistants and Nurse Practitioners) who all work together to provide you with the care you need, when you need it.  Your next appointment:   1 year(s)  Provider:   Dr. Turner   

## 2022-06-26 ENCOUNTER — Ambulatory Visit: Payer: Medicare PPO | Admitting: Diagnostic Neuroimaging

## 2022-06-26 ENCOUNTER — Encounter: Payer: Self-pay | Admitting: Diagnostic Neuroimaging

## 2022-06-26 VITALS — BP 157/72 | HR 72 | Ht 65.0 in | Wt 140.0 lb

## 2022-06-26 DIAGNOSIS — G629 Polyneuropathy, unspecified: Secondary | ICD-10-CM | POA: Diagnosis not present

## 2022-06-26 NOTE — Patient Instructions (Signed)
NEUROPATHY (idiopathic vs autoimmune) - continue gabapentin for symptom control; may increase over time as tolerated; recommend to follow up with PCP or pain mgmt  RHEUMATOID ARTHRITIS - per Dr. Estanislado Pandy

## 2022-06-26 NOTE — Progress Notes (Signed)
Chief Complaint  Patient presents with   Follow-up    RM 7 alone Pt is well, states she has had a difficult time with her lower extremities in the last month. More cramping, pain, burning, numb, pens and needles.      History of Present Illness:  UPDATE (06/26/22, VRP): Since last visit, continue with burning and pain in toes, feet, calves. Also with right lower back pain and right hip. Tolerating meds.    Observations/Objective:  GENERAL EXAM/CONSTITUTIONAL: Vitals:  Vitals:   06/26/22 1317 06/26/22 1324  BP: (!) 163/69 (!) 157/72  Pulse: 76 72  Weight: 140 lb (63.5 kg)   Height: '5\' 5"'$  (1.651 m)    Body mass index is 23.3 kg/m. Wt Readings from Last 3 Encounters:  06/26/22 140 lb (63.5 kg)  06/25/22 134 lb (60.8 kg)  06/21/22 138 lb 9.6 oz (62.9 kg)   Patient is in no distress; well developed, nourished and groomed; neck is supple  CARDIOVASCULAR: Examination of carotid arteries is normal; no carotid bruits Regular rate and rhythm, no murmurs Examination of peripheral vascular system by observation and palpation is normal  EYES: Ophthalmoscopic exam of optic discs and posterior segments is normal; no papilledema or hemorrhages No results found.  MUSCULOSKELETAL: Gait, strength, tone, movements noted in Neurologic exam below  NEUROLOGIC: MENTAL STATUS:      No data to display         awake, alert, oriented to person, place and time recent and remote memory intact normal attention and concentration language fluent, comprehension intact, naming intact fund of knowledge appropriate  CRANIAL NERVE:  2nd - no papilledema on fundoscopic exam 2nd, 3rd, 4th, 6th - pupils equal and reactive to light, visual fields full to confrontation, extraocular muscles intact, no nystagmus 5th - facial sensation symmetric 7th - facial strength symmetric 8th - hearing intact 9th - palate elevates symmetrically, uvula midline 11th - shoulder shrug symmetric 12th -  tongue protrusion midline  MOTOR:  normal bulk and tone, DIFFUSE 4+ strength in the BUE, BLE; LIMITED BY ARTHRITIS  SENSORY:  normal and symmetric to light touch  COORDINATION:  finger-nose-finger, fine finger movements normal  REFLEXES:  deep tendon reflexes TRACE and symmetric  GAIT/STATION:  narrow based gait   03/26/18 EMG/NCS - Axonal sensorimotor polyneuropathy.  03/10/18 MRI of the cervical spine shows multilevel degenerative changes as detailed above.  The most significant findings are: 1.   At C4-C5 and C5-C6, there is mild to moderate spinal stenosis.  Additionally, at C5-C6, there is also moderately severe right foraminal narrowing with possible right C6 nerve root compression.  Changes at C5-C6 have slightly progressed when compared to the 07/15/2012 MRI. 2.   At C6-C7, there is mild spinal stenosis but no nerve root compression. 3.   At T1-T2 there is minimal anterolisthesis of T1 over T2 that has progressed since the previous MRI.  There is no nerve root compression or spinal stenosis at this level.    Assessment and Plan:  82 y.o. female here with rheumatoid arthritis, here for evaluation of neuropathy.   Dx:    1. Neuropathy      PLAN:  NEUROPATHY (idiopathic) - continue gabapentin for symptom control; may increase over time as tolerated; recommend to follow up with PCP or pain mgmt  RHEUMATOID ARTHRITIS - per Dr. Estanislado Pandy   Follow Up Instructions:  Return for return to PCP.    I spent 25 minutes of face-to-face and non-face-to-face time with patient.  This  included previsit chart review, lab review, study review, order entry, electronic health record documentation, patient education.       Penni Bombard, MD 9/39/0300, 9:23 PM Certified in Neurology, Neurophysiology and Neuroimaging  [facility] [address] [place], [place] [address] [phone]

## 2022-07-04 DIAGNOSIS — G4733 Obstructive sleep apnea (adult) (pediatric): Secondary | ICD-10-CM | POA: Diagnosis not present

## 2022-07-04 DIAGNOSIS — J069 Acute upper respiratory infection, unspecified: Secondary | ICD-10-CM | POA: Diagnosis not present

## 2022-07-04 DIAGNOSIS — H401233 Low-tension glaucoma, bilateral, severe stage: Secondary | ICD-10-CM | POA: Diagnosis not present

## 2022-07-18 DIAGNOSIS — H04123 Dry eye syndrome of bilateral lacrimal glands: Secondary | ICD-10-CM | POA: Diagnosis not present

## 2022-07-18 DIAGNOSIS — H401233 Low-tension glaucoma, bilateral, severe stage: Secondary | ICD-10-CM | POA: Diagnosis not present

## 2022-07-18 DIAGNOSIS — H409 Unspecified glaucoma: Secondary | ICD-10-CM | POA: Diagnosis not present

## 2022-07-26 ENCOUNTER — Other Ambulatory Visit: Payer: Self-pay | Admitting: *Deleted

## 2022-07-26 DIAGNOSIS — Z79899 Other long term (current) drug therapy: Secondary | ICD-10-CM

## 2022-07-27 LAB — CBC WITH DIFFERENTIAL/PLATELET
Absolute Monocytes: 612 cells/uL (ref 200–950)
Basophils Absolute: 68 cells/uL (ref 0–200)
Basophils Relative: 0.8 %
Eosinophils Absolute: 51 cells/uL (ref 15–500)
Eosinophils Relative: 0.6 %
HCT: 31.7 % — ABNORMAL LOW (ref 35.0–45.0)
Hemoglobin: 10.5 g/dL — ABNORMAL LOW (ref 11.7–15.5)
Lymphs Abs: 1522 cells/uL (ref 850–3900)
MCH: 32.3 pg (ref 27.0–33.0)
MCHC: 33.1 g/dL (ref 32.0–36.0)
MCV: 97.5 fL (ref 80.0–100.0)
MPV: 9.6 fL (ref 7.5–12.5)
Monocytes Relative: 7.2 %
Neutro Abs: 6248 cells/uL (ref 1500–7800)
Neutrophils Relative %: 73.5 %
Platelets: 519 10*3/uL — ABNORMAL HIGH (ref 140–400)
RBC: 3.25 10*6/uL — ABNORMAL LOW (ref 3.80–5.10)
RDW: 13 % (ref 11.0–15.0)
Total Lymphocyte: 17.9 %
WBC: 8.5 10*3/uL (ref 3.8–10.8)

## 2022-07-27 LAB — COMPLETE METABOLIC PANEL WITH GFR
AG Ratio: 1.7 (calc) (ref 1.0–2.5)
ALT: 15 U/L (ref 6–29)
AST: 22 U/L (ref 10–35)
Albumin: 4.1 g/dL (ref 3.6–5.1)
Alkaline phosphatase (APISO): 35 U/L — ABNORMAL LOW (ref 37–153)
BUN/Creatinine Ratio: 22 (calc) (ref 6–22)
BUN: 21 mg/dL (ref 7–25)
CO2: 23 mmol/L (ref 20–32)
Calcium: 9.7 mg/dL (ref 8.6–10.4)
Chloride: 98 mmol/L (ref 98–110)
Creat: 0.96 mg/dL — ABNORMAL HIGH (ref 0.60–0.95)
Globulin: 2.4 g/dL (calc) (ref 1.9–3.7)
Glucose, Bld: 84 mg/dL (ref 65–99)
Potassium: 5.2 mmol/L (ref 3.5–5.3)
Sodium: 130 mmol/L — ABNORMAL LOW (ref 135–146)
Total Bilirubin: 0.4 mg/dL (ref 0.2–1.2)
Total Protein: 6.5 g/dL (ref 6.1–8.1)
eGFR: 60 mL/min/{1.73_m2} (ref >=60)

## 2022-07-29 ENCOUNTER — Telehealth: Payer: Self-pay | Admitting: *Deleted

## 2022-07-29 DIAGNOSIS — R350 Frequency of micturition: Secondary | ICD-10-CM | POA: Diagnosis not present

## 2022-07-29 DIAGNOSIS — Z01419 Encounter for gynecological examination (general) (routine) without abnormal findings: Secondary | ICD-10-CM | POA: Diagnosis not present

## 2022-07-29 NOTE — Telephone Encounter (Signed)
I reviewed Dr. Gladstone Lighter  note. He recommended her to fu with her PCP or pain management for the rx of Gabapentin. Please advise pt to contact her PCP.

## 2022-07-29 NOTE — Progress Notes (Signed)
Sodium is low and stable. Hemoglobin is low and stable. Please forward results to her PCP.

## 2022-07-29 NOTE — Telephone Encounter (Signed)
Patient want to let you know that Dr. Viann Fish is no longer going to see her.

## 2022-07-30 ENCOUNTER — Encounter: Payer: Self-pay | Admitting: *Deleted

## 2022-07-30 NOTE — Telephone Encounter (Signed)
Sect message via my chart to advise patient.

## 2022-08-02 DIAGNOSIS — J069 Acute upper respiratory infection, unspecified: Secondary | ICD-10-CM | POA: Diagnosis not present

## 2022-08-02 DIAGNOSIS — G4733 Obstructive sleep apnea (adult) (pediatric): Secondary | ICD-10-CM | POA: Diagnosis not present

## 2022-08-05 DIAGNOSIS — N390 Urinary tract infection, site not specified: Secondary | ICD-10-CM | POA: Insufficient documentation

## 2022-08-11 NOTE — Therapy (Signed)
OUTPATIENT PHYSICAL THERAPY THORACOLUMBAR and LE EVALUATION   Patient Name: Felicia [name] MRN: [id number] DOB:1941-03-18, 82 y.o., female Today's Date: 08/13/2022  END OF SESSION:  PT End of Session - 08/12/22 1023     Visit Number 1    Number of Visits 12    Date for PT Re-Evaluation 09/23/22    Authorization Type [employer] MCR    PT Start Time 1018    PT Stop Time 1114    PT Time Calculation (min) 56 min    Activity Tolerance Patient tolerated treatment well    Behavior During Therapy [facility] for tasks assessed/performed             Past Medical History:  Diagnosis Date   Adenomatous colon polyp 2004   Colo in 2009 "no polyps"   Complication of anesthesia    small airway per patient   Echocardiogram    Echo 8/22 EF 60-65, no RWMA, mild LVH, normal RVSF, moderately elevated PASP (RVSP 50.9), trivial MR, mild AI, AV sclerosis without stenosis   GERD (gastroesophageal reflux disease)    Glaucoma    Hiatal hernia    Hypertension    Neuropathy    Nuclear stress test    Myoview 8/22: EF 65, no ischemia; low risk   Rheumatoid arthritis(714.0)    [place]; Methotrexate (Dr. Patrecia Pour)   Sleep apnea    on CPAP   Past Surgical History:  Procedure Laterality Date   CARDIAC CATHETERIZATION N/A 03/29/2015   Procedure: Left Heart Cath and Coronary Angiography;  Surgeon: Belva Crome, MD;  Location: [facility];  Service: Cardiovascular;  Laterality: N/A;   CATARACT EXTRACTION, BILATERAL     COLONOSCOPY W/ POLYPECTOMY     X1; negative subsequently   DILATION AND CURETTAGE OF UTERUS     EYE SURGERY     due to RA   EYE SURGERY Left 06/2017   eyelid tendon    HYSTEROSCOPY WITH D & C  06/24/2012   Procedure: DILATATION AND CURETTAGE /HYSTEROSCOPY;  Surgeon: [name], MD;  Location: [facility];  Service: Gynecology;  Laterality: N/A;   SHOULDER SURGERY Left    due to RA   SQUAMOUS CELL CARCINOMA EXCISION     scalp and left leg   TRABECULECTOMY     X 2 OD; X 1 OS    UPPER GASTROINTESTINAL ENDOSCOPY     hiatal hernia   Patient Active Problem List   Diagnosis Date Noted   Right-sided low back pain without sciatica 06/21/2022   Coronary artery disease involving native coronary artery with angina pectoris ([place]) 04/08/2022   Pulmonary HTN ([place]) 04/08/2022   Shortness of breath 04/08/2022   Greater trochanteric pain syndrome of right lower extremity 10/23/2021   Age-related osteoporosis without current pathological fracture 06/26/2021   Aplastic anemia ([place]) 06/26/2021   Hiatal hernia 06/26/2021   Prediabetes 06/26/2021   Neuropathy 06/16/2020   SIADH (syndrome of inappropriate ADH production) ([place]) 02/18/2020   Hyponatremia 11/18/2019   Thrombocytosis 08/01/2018   URI (upper respiratory infection) 04/14/2017   Primary osteoarthritis of both knees 03/06/2017   Exertional chest pain    Pain in the chest 03/27/2015   Postural hypotension 05/26/2013   OSA (obstructive sleep apnea) 03/22/2013   Lens replaced by other means 08/25/2012   Low tension open-angle glaucoma(365.12) 02/14/2012   Osteoarthritis 11/07/2011   Abdominal bruit 10/16/2011   Essential hypertension 08/13/2011   Anisocoria 08/13/2011   Cervical radiculopathy 07/17/2011   DDD (degenerative disc disease), cervical  07/10/2011   Glaucoma 06/06/2011   HIATAL HERNIA 01/30/2010   POSTMENOPAUSAL SYNDROME 12/14/2009   EUSTACHIAN TUBE DYSFUNCTION, RIGHT 06/28/2009   GERD 12/07/2008   Rheumatoid arthritis ([place]) 12/07/2008   COLONIC POLYPS, HX OF 12/07/2008   Hyperlipidemia LDL goal <70 10/17/2006   HYPERCALCEMIA 10/17/2006   Mitral valve disease 10/17/2006     REFERRING PROVIDER:  de [place], Raymond J, MD  REFERRING DIAG: [phone] (ICD-10-CM) - Greater trochanteric pain syndrome of right lower extremity            M54.50 (ICD-10-CM) - Right-sided low back pain without sciatica, unspecified chronicity  Rationale for Evaluation and Treatment: Rehabilitation  THERAPY DIAG:  Pain in  right hip  Muscle weakness (generalized)  Difficulty in walking, not elsewhere classified  Other low back pain  ONSET DATE: November 2023 Exacerbation  SUBJECTIVE:                                                                                                                                                                                           SUBJECTIVE STATEMENT: Pt performed an exercise class trying to perform all the exercises last yeast at [facility] and had R hip/thigh pain after the calss.  Pt saw MD and received an injection of the R GT bursa on 10/23/21. She had improved pain and sx's and then received PT for R hip from June to August of last year.  She responded very well to PT. Pt states she was doing well until late November.  She states she had several MD appointments due to a cardiac issue which caused her to sit for a prolonged amount of time.  Pt thinks this triggered increased right hip and lower back pain.  Pt saw Dr. Burnard Bunting on 12/4 and received an injection of R GT bursa.  Pt reports having some improvement with the injection.  MD ordered PT.    Pt states she has neuropathy and her numbness and stiffness have worsened.  Pt also saw neurologist who informed her she needs to improve her strength.   Pt saw RA MD and received IT band stretches.  Pt reports she has done them which help, but doesn't do them regularly.    Pt has a hx of lumbar pain and lumbar radiculopathy into L LE pain and has received multiple lumbar injections with the most recent one being on 09/19/21.  MD note indicates MRI from 2022 exhibits multi-level facet hypertrophy, superiorly migrating left paracentral extrusion impinging on the descending L4 nerve root. Disc material and facet spurring also impinges on the descending left L5 nerve root.  Pt received PT for lumbar and reports  improved sx's with PT.  Pt reports she her R hip bothers her more than her back and she has not been having radicular pain in L LE.     Pt feels weak in her legs.  Pt has stiffness and burning in distal bilat LE's.  Pt is able to stand up better now, but has to be careful with sit/stand transfers.  Pt continues to have pain with standing up from a chair though not as bad as it used to.  She has pain with prolonged sitting.  Pt uses a walking stick with walking in her neighborhood.  Pt is limited with ambulation distance including her walking program due to fatigue and pain.  Pt states her balance is off with gait, due to her neuropathy.  Pt states she aches a lot and has to move a lot due to RA.  Pt stopped doing her Tai Chi class due to pain.     Pt reports her glaucoma is worse.  Pt didn't perform gym exercises because of restrictions due to eye procedures though is cleared to do that now.  She hasn't been performing much of her HEP.     PERTINENT HISTORY:  RA, neuropathy, osteoporosis improved to ostepenia, OA, Hx of Lumbar radiculopathy and L LE pain, mild to moderate spinal stenosis C4-C6, and CAD.   PAIN:  Are you having pain? yes Location: R hip NPRS:  3/10 current and best, 10/10 worst  Location:  Lumbar NPRS:  0/10 current and best, 6-7/10 worst  Location:  distal LE's to feet  NPRS:  6-7/10 current and worst, 4-5/10 best  PRECAUTIONS: Other: Osteoporosis/osteopenia, RA, and hx of lumbar radiculopathy and L LE pain; lumbar anterolisthesis  WEIGHT BEARING RESTRICTIONS: No  FALLS:  Has patient fallen in last 6 months? No   OCCUPATION: Pt is retired  PLOF: Independent  PATIENT GOALS: improve strength, reduce pain, walk better   OBJECTIVE:   DIAGNOSTIC FINDINGS:  Lumbar MRI on 07/19/2020: IMPRESSION: 1. Multilevel disc and facet degeneration with levoscoliosis and L3-4, L4-5 anterolisthesis. 2. L4-5 left paracentral extrusion with superior migration and impingement on the left L4 and L5 nerve roots. No other neural impingement, suspect this is the symptomatic finding. 3. Active facet arthritis  on the left at L4-5.  PATIENT SURVEYS:  FOTO Hip:  78 with a goal of 52 at visit #13 ; Lumbar: 42 with a goal of 50 at visit #13    COGNITION: Overall cognitive status: Within functional limits for tasks assessed       PALPATION: TTP:  bilat lateral hip, glute, and ITB.  R > L GT.    LOWER EXTREMITY MMT:    MMT Right eval Left eval  Hip flexion 4-/5 4-/5  Hip extension    Hip abduction 3+/5 4-/5  Hip adduction    Hip internal rotation    Hip external rotation 3+/5 3+/5  Knee flexion 4/5 seated 5/5 seated  Knee extension 4-/5 4-/5  Ankle dorsiflexion    Ankle plantarflexion    Ankle inversion    Ankle eversion     (Blank rows = not tested)  LUMBAR SPECIAL TESTS:  Supine SLR test:  negative bilat   GAIT: Assistive device utilized: None Level of assistance: Complete Independence Comments: decreased step length bilat, decreased gait speed, L > R toe out  TODAY'S TREATMENT:  PT reviewed HEP.  Pt has been performing sidestepping without resistance and LAQ without resistance.  She hasn't been performing any other of her home exercises.  She has been walking.    PATIENT EDUCATION:  Education details: PT educated pt concerning HEP.  Comparison of objective findings from today to her prior level.  Rationale of exercises, POC, dx, Person educated: Patient Education method: Explanation, Demonstration, and [place] Education comprehension: verbalized understanding and needs further education  HOME EXERCISE PROGRAM: Pt has a HEP  ASSESSMENT:  CLINICAL IMPRESSION: Patient is a 82 y.o. female with dx's of Greater trochanteric pain syndrome of right lower extremity and Right-sided low back pain without sciatica presenting to the clinic with pain in R hip > lumbar, muscle weakness, and difficulty with walking.  Pt received PT last year for R hip and  responded well.  Her pain returned in late November after increased sitting duration at multiple appointments.  Pt has not been performing her HEP.  Pt received an injection and reports some improvement.  Pt has neuropathy and reports stiffness and burning in distal bilat LE's.  Pt states her balance is off with gait, due to her neuropathy.  Pt has pain with prolonged sitting and difficulty with sit/stand transfers.  Pt is limited with ambulation distance including her walking program due to fatigue and pain.  Pt has regressed in strength since prior PT discharge.  Pt should benefit from skilled PT services to address impairments and to improve overall function.   OBJECTIVE IMPAIRMENTS: Abnormal gait, decreased activity tolerance, decreased balance, decreased endurance, decreased mobility, difficulty walking, decreased strength, and pain.   ACTIVITY LIMITATIONS: sitting, standing, transfers, and locomotion level  PARTICIPATION LIMITATIONS: community activity  PERSONAL FACTORS: Time since onset of injury/illness/exacerbation and 3+ comorbidities: RA, neuropathy, osteoporosis/ostepenia, and OA  are also affecting patient's functional outcome.   REHAB POTENTIAL: Good  CLINICAL DECISION MAKING: Stable/uncomplicated  EVALUATION COMPLEXITY: Low   GOALS:   SHORT TERM GOALS: Target date: 09/02/2022   Pt will return to performing HEP and will be independent with HEP for improved pain, strength, and function.  Baseline: Goal status: INITIAL  2.  Pt will demo improved gait speed without increased pain.  Baseline:  Goal status: INITIAL  3.  Pt will be able to perform sit/stand transfers without difficultly and without increased pain.  Baseline:  Goal status: INITIAL  4.  Pt will report improved pain with ambulation.  Baseline:  Goal status: INITIAL    LONG TERM GOALS: Target date: 09/23/2022  Pt will report improved stability with gait and increased tolerance with walking program.    Baseline:  Goal status: INITIAL  2.  Pt will demo improved bilat hip and knee strength to 4+ to 5/5 MMT strength for improved performance of functional mobility.   Baseline:  Goal status: INITIAL  3.  Pt will ambulate extended community distance without significant pain and difficulty.  Baseline:  Goal status: INITIAL   PLAN:  PT FREQUENCY: 2x/week  PT DURATION: 6 weeks  PLANNED INTERVENTIONS: Therapeutic exercises, Therapeutic activity, Neuromuscular re-education, Balance training, Gait training, Patient/Family education, Stair training, DME instructions, Aquatic Therapy, Dry Needling, Electrical stimulation, Cryotherapy, Moist heat, Taping, Ultrasound, Manual therapy, and Re-evaluation .  PLAN FOR NEXT SESSION: Cont with hip and core strengthening.  STW to bilat glutes and ITB.  Review HEP.  Referring diagnosis? M25.551 (ICD-10-CM) - Greater trochanteric pain syndrome of right lower extremity            M54.50 (ICD-10-CM) -  Right-sided low back pain without sciatica, unspecified chronicity  Treatment diagnosis? (if different than referring diagnosis)  Pain in right hip  Muscle weakness (generalized)  Difficulty in walking, not elsewhere classified  Other low back pain  What was this (referring dx) caused by? '[]'$  Surgery '[]'$  Fall '[]'$  Ongoing issue '[]'$  Arthritis '[]'$  Other: ____________  Laterality: '[]'$  Rt '[]'$  Lt '[]'$  Both  Check all possible CPT codes:  *CHOOSE 10 OR LESS*    '[x]'$  97110 (Therapeutic Exercise)  '[]'$  92507 (SLP Treatment)  '[x]'$  97112 (Neuro Re-ed)   '[]'$  92526 (Swallowing Treatment)   '[x]'$  97116 (Gait Training)   '[]'$  [id number] (Cognitive Training, 1st 15 minutes) '[x]'$  97140 (Manual Therapy)   '[]'$  97130 (Cognitive Training, each add'l 15 minutes)  '[x]'$  97164 (Re-evaluation)                              '[]'$  Other, List CPT Code ____________  '[x]'$  82956 (Therapeutic Activities)     '[x]'$  [id number] (Self Care)   '[]'$  All codes above (97110 - 97535)  '[]'$  97012 (Mechanical  Traction)  '[x]'$  97014 (E-stim Unattended)  '[]'$  97032 (E-stim manual)  '[]'$  97033 (Ionto)  '[x]'$  97035 (Ultrasound) '[x]'$  97750 (Physical Performance Training) '[x]'$  [id number] (Aquatic Therapy) '[]'$  97016 (Vasopneumatic Device) '[]'$  [id number] (Paraffin) '[]'$  97034 (Contrast Bath) '[]'$  97597 (Wound Care 1st 20 sq cm) '[]'$  97598 (Wound Care each add'l 20 sq cm) '[]'$  97760 (Orthotic Fabrication, Fitting, Training Initial) '[]'$  [id number] (Prosthetic Management and Training Initial) '[]'$  [id number] (Orthotic or Prosthetic Training/ Modification Subsequent)  Selinda Michaels III PT, DPT 08/13/22 5:45 PM

## 2022-08-12 ENCOUNTER — Encounter (HOSPITAL_BASED_OUTPATIENT_CLINIC_OR_DEPARTMENT_OTHER): Payer: Self-pay | Admitting: Physical Therapy

## 2022-08-12 ENCOUNTER — Ambulatory Visit (HOSPITAL_BASED_OUTPATIENT_CLINIC_OR_DEPARTMENT_OTHER): Payer: Medicare PPO | Attending: Family Medicine | Admitting: Physical Therapy

## 2022-08-12 ENCOUNTER — Other Ambulatory Visit: Payer: Self-pay

## 2022-08-12 DIAGNOSIS — M5459 Other low back pain: Secondary | ICD-10-CM

## 2022-08-12 DIAGNOSIS — M6281 Muscle weakness (generalized): Secondary | ICD-10-CM | POA: Diagnosis not present

## 2022-08-12 DIAGNOSIS — R262 Difficulty in walking, not elsewhere classified: Secondary | ICD-10-CM | POA: Diagnosis not present

## 2022-08-12 DIAGNOSIS — M25551 Pain in right hip: Secondary | ICD-10-CM | POA: Diagnosis not present

## 2022-08-12 DIAGNOSIS — M545 Low back pain, unspecified: Secondary | ICD-10-CM | POA: Insufficient documentation

## 2022-08-13 DIAGNOSIS — Z1231 Encounter for screening mammogram for malignant neoplasm of breast: Secondary | ICD-10-CM | POA: Diagnosis not present

## 2022-08-20 ENCOUNTER — Encounter (HOSPITAL_BASED_OUTPATIENT_CLINIC_OR_DEPARTMENT_OTHER): Payer: Self-pay | Admitting: Physical Therapy

## 2022-08-20 ENCOUNTER — Ambulatory Visit (HOSPITAL_BASED_OUTPATIENT_CLINIC_OR_DEPARTMENT_OTHER): Payer: Medicare PPO | Admitting: Physical Therapy

## 2022-08-20 DIAGNOSIS — M25551 Pain in right hip: Secondary | ICD-10-CM

## 2022-08-20 DIAGNOSIS — R262 Difficulty in walking, not elsewhere classified: Secondary | ICD-10-CM | POA: Diagnosis not present

## 2022-08-20 DIAGNOSIS — M6281 Muscle weakness (generalized): Secondary | ICD-10-CM

## 2022-08-20 DIAGNOSIS — M545 Low back pain, unspecified: Secondary | ICD-10-CM | POA: Diagnosis not present

## 2022-08-20 DIAGNOSIS — M5459 Other low back pain: Secondary | ICD-10-CM

## 2022-08-20 NOTE — Therapy (Signed)
OUTPATIENT PHYSICAL THERAPY TREATMENT NOTE   Patient Name: Felicia [name] MRN: [id number] DOB:1940-12-06, 82 y.o., female Today's Date: 08/21/2022  END OF SESSION:  PT End of Session - 08/20/22 1022     Visit Number 2    Number of Visits 12    Date for PT Re-Evaluation 09/23/22    Authorization Type [employer] MCR    PT Start Time 1019    PT Stop Time 1104    PT Time Calculation (min) 45 min    Activity Tolerance Patient tolerated treatment well    Behavior During Therapy [facility] for tasks assessed/performed             Past Medical History:  Diagnosis Date   Adenomatous colon polyp 2004   Colo in 2009 "no polyps"   Complication of anesthesia    small airway per patient   Echocardiogram    Echo 8/22 EF 60-65, no RWMA, mild LVH, normal RVSF, moderately elevated PASP (RVSP 50.9), trivial MR, mild AI, AV sclerosis without stenosis   GERD (gastroesophageal reflux disease)    Glaucoma    Hiatal hernia    Hypertension    Neuropathy    Nuclear stress test    Myoview 8/22: EF 65, no ischemia; low risk   Rheumatoid arthritis(714.0)    Orencia; Methotrexate (Dr. Patrecia Pour)   Sleep apnea    on CPAP   Past Surgical History:  Procedure Laterality Date   CARDIAC CATHETERIZATION N/A 03/29/2015   Procedure: Left Heart Cath and Coronary Angiography;  Surgeon: Belva Crome, MD;  Location: [place] [facility];  Service: Cardiovascular;  Laterality: N/A;   CATARACT EXTRACTION, BILATERAL     COLONOSCOPY W/ POLYPECTOMY     X1; negative subsequently   DILATION AND CURETTAGE OF UTERUS     EYE SURGERY     due to RA   EYE SURGERY Left 06/2017   eyelid tendon    HYSTEROSCOPY WITH D & C  06/24/2012   Procedure: DILATATION AND CURETTAGE /HYSTEROSCOPY;  Surgeon: [name], MD;  Location: [place] [facility];  Service: Gynecology;  Laterality: N/A;   SHOULDER SURGERY Left    due to RA   SQUAMOUS CELL CARCINOMA EXCISION     scalp and left leg   TRABECULECTOMY     X 2 OD; X 1 OS   UPPER  GASTROINTESTINAL ENDOSCOPY     hiatal hernia   Patient Active Problem List   Diagnosis Date Noted   Right-sided low back pain without sciatica 06/21/2022   Coronary artery disease involving native coronary artery with angina pectoris ([place]) 04/08/2022   Pulmonary HTN ([place]) 04/08/2022   Shortness of breath 04/08/2022   Greater trochanteric pain syndrome of right lower extremity 10/23/2021   Age-related osteoporosis without current pathological fracture 06/26/2021   Aplastic anemia ([place]) 06/26/2021   Hiatal hernia 06/26/2021   Prediabetes 06/26/2021   Neuropathy 06/16/2020   SIADH (syndrome of inappropriate ADH production) ([place]) 02/18/2020   Hyponatremia 11/18/2019   Thrombocytosis 08/01/2018   URI (upper respiratory infection) 04/14/2017   Primary osteoarthritis of both knees 03/06/2017   Exertional chest pain    Pain in the chest 03/27/2015   Postural hypotension 05/26/2013   OSA (obstructive sleep apnea) 03/22/2013   Lens replaced by other means 08/25/2012   Low tension open-angle glaucoma(365.12) 02/14/2012   Osteoarthritis 11/07/2011   Abdominal bruit 10/16/2011   Essential hypertension 08/13/2011   Anisocoria 08/13/2011   Cervical radiculopathy 07/17/2011   DDD (degenerative disc disease), cervical 07/10/2011  Glaucoma 06/06/2011   HIATAL HERNIA 01/30/2010   POSTMENOPAUSAL SYNDROME 12/14/2009   EUSTACHIAN TUBE DYSFUNCTION, RIGHT 06/28/2009   GERD 12/07/2008   Rheumatoid arthritis ([place]) 12/07/2008   COLONIC POLYPS, HX OF 12/07/2008   Hyperlipidemia LDL goal <70 10/17/2006   HYPERCALCEMIA 10/17/2006   Mitral valve disease 10/17/2006     REFERRING PROVIDER:  [place], Raymond J, MD  REFERRING DIAG: [phone] (ICD-10-CM) - Greater trochanteric pain syndrome of right lower extremity            M54.50 (ICD-10-CM) - Right-sided low back pain without sciatica, unspecified chronicity  Rationale for Evaluation and Treatment: Rehabilitation  THERAPY DIAG:  Pain in right  hip  Muscle weakness (generalized)  Difficulty in walking, not elsewhere classified  Other low back pain  ONSET DATE: November 2023 Exacerbation  SUBJECTIVE:                                                                                                                                                                                           SUBJECTIVE STATEMENT: Pt denies any adverse effects after prior Rx.  Pt states she has some "lurking" pain today.  Pt states she has been performing standing hip flex, standing hip abd, sidestepping, and longsitting glute stretch at home.  Pt states she has lost her peripheral vision due to glaucoma.  Pt bought some new orthotics last week.  Pt is limited with ambulation distance.     PERTINENT HISTORY:  RA, neuropathy, osteoporosis improved to ostepenia, OA, Hx of Lumbar radiculopathy and L LE pain, mild to moderate spinal stenosis C4-C6, and CAD. Lost her peripheral vision due to glaucoma   PAIN:  Are you having pain? yes Location: R posterior hip NPRS:  4-5/10 current and best, 10/10 worst  Location:  Central Lumbar and R sided lower lumbar NPRS:  4-5/10 current and best, 6-7/10 worst   PRECAUTIONS: Other: Osteoporosis/osteopenia, RA, and hx of lumbar radiculopathy and L LE pain; lumbar anterolisthesis  WEIGHT BEARING RESTRICTIONS: No  FALLS:  Has patient fallen in last 6 months? No   OCCUPATION: Pt is retired  PLOF: Independent  PATIENT GOALS: improve strength, reduce pain, walk better   OBJECTIVE:   DIAGNOSTIC FINDINGS:  Lumbar MRI on 07/19/2020: IMPRESSION: 1. Multilevel disc and facet degeneration with levoscoliosis and L3-4, L4-5 anterolisthesis. 2. L4-5 left paracentral extrusion with superior migration and impingement on the left L4 and L5 nerve roots. No other neural impingement, suspect this is the symptomatic finding. 3. Active facet arthritis on the left at L4-5.   TODAY'S TREATMENT:  Reviewed pt presentation, pain level, and response to prior Rx.  PT reviewed HEP.     Therapeutic Exercise: PPT 2x10  Bridges 2x10  Supine glute stretch  Supine manual ITB stretch 2x20-30 sec bilat Supine manual HS stretch 2x20-30 sec bilat LAQ 2x10 Supine clams red TB 2 x 10 reps     Manual Therapy: Pt received STM and rolling to R glute, lateral hip and ITB  in L S/L'ing with pillow between knees to improve soft tissue tightness, mobility, and pain.    PATIENT EDUCATION:  Education details: PT educated pt concerning HEP.  Comparison of objective findings from today to her prior level.  Rationale of exercises, POC, dx, Person educated: Patient Education method: Explanation, Demonstration, and [place] Education comprehension: verbalized understanding and needs further education  HOME EXERCISE PROGRAM: Pt has a HEP  ASSESSMENT:  CLINICAL IMPRESSION: PT worked on hip and quad strengthening, LE flexibility, and soft tissue mobility.  She performed exercises well with cuing and instruction in correct form and positioning.  Pt performed exercises without c/o's.  She felt her lumbar a little with bridging which improved with increased reps.  Pt has soft tissue tightness and tenderness in glute and ITB.  Pt responded well to Rx and reports improved pain and sx's after Rx.  Pt states she feels better after STW.  Pt should benefit from skilled PT services to address impairments and to improve overall function.   OBJECTIVE IMPAIRMENTS: Abnormal gait, decreased activity tolerance, decreased balance, decreased endurance, decreased mobility, difficulty walking, decreased strength, and pain.   ACTIVITY LIMITATIONS: sitting, standing, transfers, and locomotion level  PARTICIPATION LIMITATIONS: community activity  PERSONAL FACTORS: Time since onset of injury/illness/exacerbation and 3+  comorbidities: RA, neuropathy, osteoporosis/ostepenia, and OA  are also affecting patient's functional outcome.   REHAB POTENTIAL: Good  CLINICAL DECISION MAKING: Stable/uncomplicated  EVALUATION COMPLEXITY: Low   GOALS:   SHORT TERM GOALS: Target date: 09/02/2022   Pt will return to performing HEP and will be independent with HEP for improved pain, strength, and function.  Baseline: Goal status: INITIAL  2.  Pt will demo improved gait speed without increased pain.  Baseline:  Goal status: INITIAL  3.  Pt will be able to perform sit/stand transfers without difficultly and without increased pain.  Baseline:  Goal status: INITIAL  4.  Pt will report improved pain with ambulation.  Baseline:  Goal status: INITIAL    LONG TERM GOALS: Target date: 09/23/2022  Pt will report improved stability with gait and increased tolerance with walking program.   Baseline:  Goal status: INITIAL  2.  Pt will demo improved bilat hip and knee strength to 4+ to 5/5 MMT strength for improved performance of functional mobility.   Baseline:  Goal status: INITIAL  3.  Pt will ambulate extended community distance without significant pain and difficulty.  Baseline:  Goal status: INITIAL   PLAN:  PT FREQUENCY: 2x/week  PT DURATION: 6 weeks  PLANNED INTERVENTIONS: Therapeutic exercises, Therapeutic activity, Neuromuscular re-education, Balance training, Gait training, Patient/Family education, Stair training, DME instructions, Aquatic Therapy, Dry Needling, Electrical stimulation, Cryotherapy, Moist heat, Taping, Ultrasound, Manual therapy, and Re-evaluation .  PLAN FOR NEXT SESSION: Cont with hip and core strengthening.  STW to bilat glutes and ITB.    Selinda Michaels III PT, DPT 08/21/22 10:21 AM

## 2022-08-21 DIAGNOSIS — G4733 Obstructive sleep apnea (adult) (pediatric): Secondary | ICD-10-CM | POA: Diagnosis not present

## 2022-08-22 ENCOUNTER — Encounter (HOSPITAL_BASED_OUTPATIENT_CLINIC_OR_DEPARTMENT_OTHER): Payer: Self-pay | Admitting: Physical Therapy

## 2022-08-22 ENCOUNTER — Ambulatory Visit (HOSPITAL_BASED_OUTPATIENT_CLINIC_OR_DEPARTMENT_OTHER): Payer: Medicare PPO | Admitting: Physical Therapy

## 2022-08-22 DIAGNOSIS — R262 Difficulty in walking, not elsewhere classified: Secondary | ICD-10-CM | POA: Diagnosis not present

## 2022-08-22 DIAGNOSIS — M5459 Other low back pain: Secondary | ICD-10-CM

## 2022-08-22 DIAGNOSIS — M25551 Pain in right hip: Secondary | ICD-10-CM

## 2022-08-22 DIAGNOSIS — M6281 Muscle weakness (generalized): Secondary | ICD-10-CM | POA: Diagnosis not present

## 2022-08-22 DIAGNOSIS — M545 Low back pain, unspecified: Secondary | ICD-10-CM | POA: Diagnosis not present

## 2022-08-22 NOTE — Therapy (Signed)
OUTPATIENT PHYSICAL THERAPY TREATMENT NOTE   Patient Name: Felicia [name] MRN: [id number] DOB:[birth year], 82 y.o., female Today's Date: 08/22/2022  END OF SESSION:  PT End of Session - 08/22/22 0944     Visit Number 3    Number of Visits 12    Date for PT Re-Evaluation 09/23/22    Authorization Type [employer] MCR    PT Start Time [phone]   Pt arrived late but also had to go to the bathroom which delayed the start of treatment   PT Stop Time 1021    PT Time Calculation (min) 38 min    Activity Tolerance Patient tolerated treatment well    Behavior During Therapy [facility] for tasks assessed/performed             Past Medical History:  Diagnosis Date   Adenomatous colon polyp 2004   Colo in 2009 "no polyps"   Complication of anesthesia    small airway per patient   Echocardiogram    Echo 8/22 EF 60-65, no RWMA, mild LVH, normal RVSF, moderately elevated PASP (RVSP 50.9), trivial MR, mild AI, AV sclerosis without stenosis   GERD (gastroesophageal reflux disease)    Glaucoma    Hiatal hernia    Hypertension    Neuropathy    Nuclear stress test    Myoview 8/22: EF 65, no ischemia; low risk   Rheumatoid arthritis(714.0)    Orencia; Methotrexate (Dr. Patrecia Pour)   Sleep apnea    on CPAP   Past Surgical History:  Procedure Laterality Date   CARDIAC CATHETERIZATION N/A 03/29/2015   Procedure: Left Heart Cath and Coronary Angiography;  Surgeon: Belva Crome, MD;  Location: [place] [facility];  Service: Cardiovascular;  Laterality: N/A;   CATARACT EXTRACTION, BILATERAL     COLONOSCOPY W/ POLYPECTOMY     X1; negative subsequently   DILATION AND CURETTAGE OF UTERUS     EYE SURGERY     due to RA   EYE SURGERY Left 06/2017   eyelid tendon    HYSTEROSCOPY WITH D & C  06/24/2012   Procedure: DILATATION AND CURETTAGE /HYSTEROSCOPY;  Surgeon: [name], MD;  Location: [facility];  Service: Gynecology;  Laterality: N/A;   SHOULDER SURGERY Left    due to RA   SQUAMOUS CELL CARCINOMA  EXCISION     scalp and left leg   TRABECULECTOMY     X 2 OD; X 1 OS   UPPER GASTROINTESTINAL ENDOSCOPY     hiatal hernia   Patient Active Problem List   Diagnosis Date Noted   Right-sided low back pain without sciatica 06/21/2022   Coronary artery disease involving native coronary artery with angina pectoris ([place]) 04/08/2022   Pulmonary HTN ([place]) 04/08/2022   Shortness of breath 04/08/2022   Greater trochanteric pain syndrome of right lower extremity 10/23/2021   Age-related osteoporosis without current pathological fracture 06/26/2021   Aplastic anemia ([place]) 06/26/2021   Hiatal hernia 06/26/2021   Prediabetes 06/26/2021   Neuropathy 06/16/2020   SIADH (syndrome of inappropriate ADH production) ([place]) 02/18/2020   Hyponatremia 11/18/2019   Thrombocytosis 08/01/2018   URI (upper respiratory infection) 04/14/2017   Primary osteoarthritis of both knees 03/06/2017   Exertional chest pain    Pain in the chest 03/27/2015   Postural hypotension 05/26/2013   OSA (obstructive sleep apnea) 03/22/2013   Lens replaced by other means 08/25/2012   Low tension open-angle glaucoma(365.12) 02/14/2012   Osteoarthritis 11/07/2011   Abdominal bruit 10/16/2011   Essential hypertension 08/13/2011  Anisocoria 08/13/2011   Cervical radiculopathy 07/17/2011   DDD (degenerative disc disease), cervical 07/10/2011   Glaucoma 06/06/2011   HIATAL HERNIA 01/30/2010   POSTMENOPAUSAL SYNDROME 12/14/2009   EUSTACHIAN TUBE DYSFUNCTION, RIGHT 06/28/2009   GERD 12/07/2008   Rheumatoid arthritis ([place]) 12/07/2008   COLONIC POLYPS, HX OF 12/07/2008   Hyperlipidemia LDL goal <70 10/17/2006   HYPERCALCEMIA 10/17/2006   Mitral valve disease 10/17/2006     REFERRING PROVIDER:  [place], Raymond J, MD  REFERRING DIAG: [phone] (ICD-10-CM) - Greater trochanteric pain syndrome of right lower extremity            M54.50 (ICD-10-CM) - Right-sided low back pain without sciatica, unspecified chronicity  Rationale  for Evaluation and Treatment: Rehabilitation  THERAPY DIAG:  Pain in right hip  Muscle weakness (generalized)  Difficulty in walking, not elsewhere classified  Other low back pain  ONSET DATE: November 2023 Exacerbation  SUBJECTIVE:                                                                                                                                                                                           SUBJECTIVE STATEMENT: Pt reports she had increased hip pain/soreness after prior Rx.  Pt thinks last Rx was helpful.  Pt states she needs to find another MD to check her neuropathy out.  Pt states her neuropathy is getting worse.  Her feet hurt quite a bit.  Pt bought some new orthotics last week.  Pt is limited with ambulation distance.     PERTINENT HISTORY:  RA, neuropathy, osteoporosis improved to ostepenia, OA, Hx of Lumbar radiculopathy and L LE pain, mild to moderate spinal stenosis C4-C6, and CAD. Lost her peripheral vision due to glaucoma   PAIN:  Are you having pain? yes Location: R posterior hip NPRS:  0 current  Location:  Central Lumbar and R sided lower lumbar NPRS:  0/10 current  Location:  bilat knees to feet NPRS: 8/10 Type:  burning   PRECAUTIONS: Other: Osteoporosis/osteopenia, RA, and hx of lumbar radiculopathy and L LE pain; lumbar anterolisthesis  WEIGHT BEARING RESTRICTIONS: No  FALLS:  Has patient fallen in last 6 months? No   OCCUPATION: Pt is retired  PLOF: Independent  PATIENT GOALS: improve strength, reduce pain, walk better   OBJECTIVE:   DIAGNOSTIC FINDINGS:  Lumbar MRI on 07/19/2020: IMPRESSION: 1. Multilevel disc and facet degeneration with levoscoliosis and L3-4, L4-5 anterolisthesis. 2. L4-5 left paracentral extrusion with superior migration and impingement on the left L4 and L5 nerve roots. No other neural impingement, suspect this is the symptomatic finding. 3. Active facet arthritis on the left at  L4-5.   TODAY'S TREATMENT:                                                                                                                                 Therapeutic Exercise: PPT 2x10  Bridges 2x10  Supine manual HS stretch 2x20-30 sec bilat Supine clams red TB 2 x 10 reps     Manual Therapy: Reviewed pt presentation, pain level, and response to prior Rx.  Pt received STM to bilat glute and lateral hip and rolling to bilat ITB  in S/L'ing with pillow between knees to improve soft tissue tightness, mobility, and pain.    PATIENT EDUCATION:  Education details:  Rationale of exercises and MT, exercise form, POC, and dx. Person educated: Patient Education method: Explanation, Demonstration, and [place] Education comprehension: verbalized understanding and needs further education  HOME EXERCISE PROGRAM: Pt has a HEP  ASSESSMENT:  CLINICAL IMPRESSION: Pt has tightness and trigger points in bilat glutes and tenderness with rolling to bilat ITB.  Pt states she feels better after STW having reduced tightness.  Pt performed exercises well with cuing for correct form and had no c/o's with exercises.  Pt responded well to Rx stating she feels better after Rx.  Pt should benefit from continued skilled PT services to address goals and impairments and to improve overall function.   OBJECTIVE IMPAIRMENTS: Abnormal gait, decreased activity tolerance, decreased balance, decreased endurance, decreased mobility, difficulty walking, decreased strength, and pain.   ACTIVITY LIMITATIONS: sitting, standing, transfers, and locomotion level  PARTICIPATION LIMITATIONS: community activity  PERSONAL FACTORS: Time since onset of injury/illness/exacerbation and 3+ comorbidities: RA, neuropathy, osteoporosis/ostepenia, and OA  are also affecting patient's functional outcome.   REHAB POTENTIAL: Good  CLINICAL DECISION MAKING: Stable/uncomplicated  EVALUATION COMPLEXITY: Low   GOALS:   SHORT TERM  GOALS: Target date: 09/02/2022   Pt will return to performing HEP and will be independent with HEP for improved pain, strength, and function.  Baseline: Goal status: INITIAL  2.  Pt will demo improved gait speed without increased pain.  Baseline:  Goal status: INITIAL  3.  Pt will be able to perform sit/stand transfers without difficultly and without increased pain.  Baseline:  Goal status: INITIAL  4.  Pt will report improved pain with ambulation.  Baseline:  Goal status: INITIAL    LONG TERM GOALS: Target date: 09/23/2022  Pt will report improved stability with gait and increased tolerance with walking program.   Baseline:  Goal status: INITIAL  2.  Pt will demo improved bilat hip and knee strength to 4+ to 5/5 MMT strength for improved performance of functional mobility.   Baseline:  Goal status: INITIAL  3.  Pt will ambulate extended community distance without significant pain and difficulty.  Baseline:  Goal status: INITIAL   PLAN:  PT FREQUENCY: 2x/week  PT DURATION: 6 weeks  PLANNED INTERVENTIONS: Therapeutic exercises, Therapeutic activity, Neuromuscular re-education, Balance training, Gait training, Patient/Family education,  Stair training, DME instructions, Aquatic Therapy, Dry Needling, Electrical stimulation, Cryotherapy, Moist heat, Taping, Ultrasound, Manual therapy, and Re-evaluation .  PLAN FOR NEXT SESSION: Cont with hip and core strengthening.  STW to bilat glutes and ITB.  Work on [employer].    Selinda Michaels III PT, DPT 08/22/22 9:53 PM

## 2022-08-26 ENCOUNTER — Ambulatory Visit (HOSPITAL_BASED_OUTPATIENT_CLINIC_OR_DEPARTMENT_OTHER): Payer: Medicare PPO | Admitting: Physical Therapy

## 2022-08-26 DIAGNOSIS — M25551 Pain in right hip: Secondary | ICD-10-CM

## 2022-08-26 DIAGNOSIS — M545 Low back pain, unspecified: Secondary | ICD-10-CM | POA: Diagnosis not present

## 2022-08-26 DIAGNOSIS — R262 Difficulty in walking, not elsewhere classified: Secondary | ICD-10-CM

## 2022-08-26 DIAGNOSIS — M6281 Muscle weakness (generalized): Secondary | ICD-10-CM

## 2022-08-26 DIAGNOSIS — M5459 Other low back pain: Secondary | ICD-10-CM

## 2022-08-26 NOTE — Therapy (Signed)
OUTPATIENT PHYSICAL THERAPY TREATMENT NOTE   Patient Name: Felicia [name] MRN: [id number] DOB:20-Apr-1941, 82 y.o., female Today's Date: 08/27/2022  END OF SESSION:  PT End of Session - 08/26/22 1035     Visit Number 4    Number of Visits 12    Date for PT Re-Evaluation 09/23/22    Authorization Type [employer] MCR    PT Start Time 1031    PT Stop Time 1111    PT Time Calculation (min) 40 min    Activity Tolerance Patient tolerated treatment well    Behavior During Therapy [facility] for tasks assessed/performed             Past Medical History:  Diagnosis Date   Adenomatous colon polyp 2004   Colo in 2009 "no polyps"   Complication of anesthesia    small airway per patient   Echocardiogram    Echo 8/22 EF 60-65, no RWMA, mild LVH, normal RVSF, moderately elevated PASP (RVSP 50.9), trivial MR, mild AI, AV sclerosis without stenosis   GERD (gastroesophageal reflux disease)    Glaucoma    Hiatal hernia    Hypertension    Neuropathy    Nuclear stress test    Myoview 8/22: EF 65, no ischemia; low risk   Rheumatoid arthritis(714.0)    Orencia; Methotrexate (Dr. Patrecia Pour)   Sleep apnea    on CPAP   Past Surgical History:  Procedure Laterality Date   CARDIAC CATHETERIZATION N/A 03/29/2015   Procedure: Left Heart Cath and Coronary Angiography;  Surgeon: Belva Crome, MD;  Location: [place] [facility];  Service: Cardiovascular;  Laterality: N/A;   CATARACT EXTRACTION, BILATERAL     COLONOSCOPY W/ POLYPECTOMY     X1; negative subsequently   DILATION AND CURETTAGE OF UTERUS     EYE SURGERY     due to RA   EYE SURGERY Left 06/2017   eyelid tendon    HYSTEROSCOPY WITH D & C  06/24/2012   Procedure: DILATATION AND CURETTAGE /HYSTEROSCOPY;  Surgeon: [name], MD;  Location: [facility];  Service: Gynecology;  Laterality: N/A;   SHOULDER SURGERY Left    due to RA   SQUAMOUS CELL CARCINOMA EXCISION     scalp and left leg   TRABECULECTOMY     X 2 OD; X 1 OS   UPPER  GASTROINTESTINAL ENDOSCOPY     hiatal hernia   Patient Active Problem List   Diagnosis Date Noted   Right-sided low back pain without sciatica 06/21/2022   Coronary artery disease involving native coronary artery with angina pectoris ([place]) 04/08/2022   Pulmonary HTN ([place]) 04/08/2022   Shortness of breath 04/08/2022   Greater trochanteric pain syndrome of right lower extremity 10/23/2021   Age-related osteoporosis without current pathological fracture 06/26/2021   Aplastic anemia ([place]) 06/26/2021   Hiatal hernia 06/26/2021   Prediabetes 06/26/2021   Neuropathy 06/16/2020   SIADH (syndrome of inappropriate ADH production) ([place]) 02/18/2020   Hyponatremia 11/18/2019   Thrombocytosis 08/01/2018   URI (upper respiratory infection) 04/14/2017   Primary osteoarthritis of both knees 03/06/2017   Exertional chest pain    Pain in the chest 03/27/2015   Postural hypotension 05/26/2013   OSA (obstructive sleep apnea) 03/22/2013   Lens replaced by other means 08/25/2012   Low tension open-angle glaucoma(365.12) 02/14/2012   Osteoarthritis 11/07/2011   Abdominal bruit 10/16/2011   Essential hypertension 08/13/2011   Anisocoria 08/13/2011   Cervical radiculopathy 07/17/2011   DDD (degenerative disc disease), cervical 07/10/2011  Glaucoma 06/06/2011   HIATAL HERNIA 01/30/2010   POSTMENOPAUSAL SYNDROME 12/14/2009   EUSTACHIAN TUBE DYSFUNCTION, RIGHT 06/28/2009   GERD 12/07/2008   Rheumatoid arthritis ([place]) 12/07/2008   COLONIC POLYPS, HX OF 12/07/2008   Hyperlipidemia LDL goal <70 10/17/2006   HYPERCALCEMIA 10/17/2006   Mitral valve disease 10/17/2006     REFERRING PROVIDER:  de [place], Raymond J, MD  REFERRING DIAG: [id number] (ICD-10-CM) - Greater trochanteric pain syndrome of right lower extremity            M54.50 (ICD-10-CM) - Right-sided low back pain without sciatica, unspecified chronicity  Rationale for Evaluation and Treatment: Rehabilitation  THERAPY DIAG:  Pain in right  hip  Muscle weakness (generalized)  Difficulty in walking, not elsewhere classified  Other low back pain  ONSET DATE: November 2023 Exacerbation  SUBJECTIVE:                                                                                                                                                                                           SUBJECTIVE STATEMENT: Pt denies any adverse effects after prior Rx.  Pt states her RA is acting up and she is having pain in multiple places today.  Pt bought some new orthotics and has been using the orthotics.  Pt is limited with ambulation distance.      PERTINENT HISTORY:  RA, neuropathy, osteoporosis improved to ostepenia, OA, Hx of Lumbar radiculopathy and L LE pain, mild to moderate spinal stenosis C4-C6, and CAD. Lost her peripheral vision due to glaucoma   PAIN:  Are you having pain? yes Location: R posterior hip NPRS:  0 current  Location:  "It's my rheumatoid".  Feet, low back, hands/fingers NPRS:  5/10 current   PRECAUTIONS: Other: Osteoporosis/osteopenia, RA, and hx of lumbar radiculopathy and L LE pain; lumbar anterolisthesis  WEIGHT BEARING RESTRICTIONS: No  FALLS:  Has patient fallen in last 6 months? No   OCCUPATION: Pt is retired  PLOF: Independent  PATIENT GOALS: improve strength, reduce pain, walk better   OBJECTIVE:   DIAGNOSTIC FINDINGS:  Lumbar MRI on 07/19/2020: IMPRESSION: 1. Multilevel disc and facet degeneration with levoscoliosis and L3-4, L4-5 anterolisthesis. 2. L4-5 left paracentral extrusion with superior migration and impingement on the left L4 and L5 nerve roots. No other neural impingement, suspect this is the symptomatic finding. 3. Active facet arthritis on the left at L4-5.   TODAY'S TREATMENT:  Therapeutic Exercise: Reviewed pt presentation, pain level,  and response to prior Rx. PPT 2x10  Bridges 2x10 Supine marching with PPT x10  Supine manual HS stretch 2x20-30 sec bilat Supine clams red TB 2 x 10 reps LAQ 2x10 bilat Sidestepping x 3 laps with and without UE support with SBA Marching on airex  x 15 reps with UE support  PT reviewed HEP and educated pt in exercises to perform at home.  Pt states she doesn't need any pictures, because she already has them at home.     Manual Therapy: Pt received STM to bilat glute and lateral hip to bilat ITB  in S/L'ing with pillow between knees to improve soft tissue tightness, mobility, and pain.    PATIENT EDUCATION:  Education details:  Rationale of exercises and MT, exercise form, POC, and dx. Person educated: Patient Education method: Explanation, Demonstration, and [place] Education comprehension: verbalized understanding and needs further education  HOME EXERCISE PROGRAM: Pt has a HEP  ASSESSMENT:  CLINICAL IMPRESSION: Pt has tightness and trigger points in bilat glutes and tenderness with STW to bilat ITB.  ITB tenderness is symmetrical on bilat sides.  Pt states she feels better after STW having reduced tightness.  PT reviewed her home exercises and updated her HEP today.  Pt declined a handout stating she already has the pictures.  She performed exercises well with cuing for correct form.  Pt has weakness in bilat LE's as evidenced by performance of exercises.  Pt responded well to Rx stating she feels better after Rx.  Pt should benefit from continued skilled PT services to address goals and impairments and to improve overall function.   OBJECTIVE IMPAIRMENTS: Abnormal gait, decreased activity tolerance, decreased balance, decreased endurance, decreased mobility, difficulty walking, decreased strength, and pain.   ACTIVITY LIMITATIONS: sitting, standing, transfers, and locomotion level  PARTICIPATION LIMITATIONS: community activity  PERSONAL FACTORS: Time since onset of  injury/illness/exacerbation and 3+ comorbidities: RA, neuropathy, osteoporosis/ostepenia, and OA  are also affecting patient's functional outcome.   REHAB POTENTIAL: Good  CLINICAL DECISION MAKING: Stable/uncomplicated  EVALUATION COMPLEXITY: Low   GOALS:   SHORT TERM GOALS: Target date: 09/02/2022   Pt will return to performing HEP and will be independent with HEP for improved pain, strength, and function.  Baseline: Goal status: INITIAL  2.  Pt will demo improved gait speed without increased pain.  Baseline:  Goal status: INITIAL  3.  Pt will be able to perform sit/stand transfers without difficultly and without increased pain.  Baseline:  Goal status: INITIAL  4.  Pt will report improved pain with ambulation.  Baseline:  Goal status: INITIAL    LONG TERM GOALS: Target date: 09/23/2022  Pt will report improved stability with gait and increased tolerance with walking program.   Baseline:  Goal status: INITIAL  2.  Pt will demo improved bilat hip and knee strength to 4+ to 5/5 MMT strength for improved performance of functional mobility.   Baseline:  Goal status: INITIAL  3.  Pt will ambulate extended community distance without significant pain and difficulty.  Baseline:  Goal status: INITIAL   PLAN:  PT FREQUENCY: 2x/week  PT DURATION: 6 weeks  PLANNED INTERVENTIONS: Therapeutic exercises, Therapeutic activity, Neuromuscular re-education, Balance training, Gait training, Patient/Family education, Stair training, DME instructions, Aquatic Therapy, Dry Needling, Electrical stimulation, Cryotherapy, Moist heat, Taping, Ultrasound, Manual therapy, and Re-evaluation .  PLAN FOR NEXT SESSION: Cont with hip and core strengthening.  STW to bilat glutes and ITB.  Selinda Michaels III PT, DPT 08/27/22 10:18 AM

## 2022-08-27 ENCOUNTER — Encounter (HOSPITAL_BASED_OUTPATIENT_CLINIC_OR_DEPARTMENT_OTHER): Payer: Self-pay | Admitting: Physical Therapy

## 2022-08-28 ENCOUNTER — Other Ambulatory Visit: Payer: Self-pay | Admitting: Physician Assistant

## 2022-08-28 NOTE — Telephone Encounter (Signed)
Last Fill: 06/05/2022  Labs: 07/26/2022 Sodium is low and stable. Hemoglobin is low and stable.   Next Visit: 10/15/2022  Last Visit: 05/13/2022  DX: Rheumatoid arthritis involving multiple sites with positive rheumatoid factor   Current Dose per office note 05/13/2022: Methotrexate 4 tablets by mouth every 7 days   Okay to refill Methotrexate?

## 2022-09-02 ENCOUNTER — Ambulatory Visit (HOSPITAL_BASED_OUTPATIENT_CLINIC_OR_DEPARTMENT_OTHER): Payer: Medicare PPO | Attending: Family Medicine | Admitting: Physical Therapy

## 2022-09-02 DIAGNOSIS — M5459 Other low back pain: Secondary | ICD-10-CM | POA: Insufficient documentation

## 2022-09-02 DIAGNOSIS — M6281 Muscle weakness (generalized): Secondary | ICD-10-CM | POA: Insufficient documentation

## 2022-09-02 DIAGNOSIS — M25551 Pain in right hip: Secondary | ICD-10-CM | POA: Diagnosis not present

## 2022-09-02 DIAGNOSIS — R262 Difficulty in walking, not elsewhere classified: Secondary | ICD-10-CM

## 2022-09-02 NOTE — Therapy (Signed)
OUTPATIENT PHYSICAL THERAPY TREATMENT NOTE   Patient Name: Felicia [name] MRN: [id number] DOB:1941-05-14, 82 y.o., female Today's Date: 09/03/2022  END OF SESSION:  PT End of Session - 09/02/22 0951     Visit Number 5    Number of Visits 12    Date for PT Re-Evaluation 09/23/22    Authorization Type [employer] MCR    PT Start Time [id number]    PT Stop Time 1018    PT Time Calculation (min) 36 min    Activity Tolerance Patient tolerated treatment well    Behavior During Therapy [facility] for tasks assessed/performed             Past Medical History:  Diagnosis Date   Adenomatous colon polyp 2004   Colo in 2009 "no polyps"   Complication of anesthesia    small airway per patient   Echocardiogram    Echo 8/22 EF 60-65, no RWMA, mild LVH, normal RVSF, moderately elevated PASP (RVSP 50.9), trivial MR, mild AI, AV sclerosis without stenosis   GERD (gastroesophageal reflux disease)    Glaucoma    Hiatal hernia    Hypertension    Neuropathy    Nuclear stress test    Myoview 8/22: EF 65, no ischemia; low risk   Rheumatoid arthritis(714.0)    Orencia; Methotrexate (Dr. Patrecia Pour)   Sleep apnea    on CPAP   Past Surgical History:  Procedure Laterality Date   CARDIAC CATHETERIZATION N/A 03/29/2015   Procedure: Left Heart Cath and Coronary Angiography;  Surgeon: Belva Crome, MD;  Location: [place] [facility];  Service: Cardiovascular;  Laterality: N/A;   CATARACT EXTRACTION, BILATERAL     COLONOSCOPY W/ POLYPECTOMY     X1; negative subsequently   DILATION AND CURETTAGE OF UTERUS     EYE SURGERY     due to RA   EYE SURGERY Left 06/2017   eyelid tendon    HYSTEROSCOPY WITH D & C  06/24/2012   Procedure: DILATATION AND CURETTAGE /HYSTEROSCOPY;  Surgeon: [name], MD;  Location: [facility];  Service: Gynecology;  Laterality: N/A;   SHOULDER SURGERY Left    due to RA   SQUAMOUS CELL CARCINOMA EXCISION     scalp and left leg   TRABECULECTOMY     X 2 OD; X 1 OS   UPPER  GASTROINTESTINAL ENDOSCOPY     hiatal hernia   Patient Active Problem List   Diagnosis Date Noted   Right-sided low back pain without sciatica 06/21/2022   Coronary artery disease involving native coronary artery with angina pectoris 04/08/2022   Pulmonary HTN 04/08/2022   Shortness of breath 04/08/2022   Greater trochanteric pain syndrome of right lower extremity 10/23/2021   Age-related osteoporosis without current pathological fracture 06/26/2021   Aplastic anemia 06/26/2021   Hiatal hernia 06/26/2021   Prediabetes 06/26/2021   Neuropathy 06/16/2020   SIADH (syndrome of inappropriate ADH production) 02/18/2020   Hyponatremia 11/18/2019   Thrombocytosis 08/01/2018   URI (upper respiratory infection) 04/14/2017   Primary osteoarthritis of both knees 03/06/2017   Exertional chest pain    Pain in the chest 03/27/2015   Postural hypotension 05/26/2013   OSA (obstructive sleep apnea) 03/22/2013   Lens replaced by other means 08/25/2012   Low tension open-angle glaucoma(365.12) 02/14/2012   Osteoarthritis 11/07/2011   Abdominal bruit 10/16/2011   Essential hypertension 08/13/2011   Anisocoria 08/13/2011   Cervical radiculopathy 07/17/2011   DDD (degenerative disc disease), cervical 07/10/2011   Glaucoma 06/06/2011  HIATAL HERNIA 01/30/2010   POSTMENOPAUSAL SYNDROME 12/14/2009   EUSTACHIAN TUBE DYSFUNCTION, RIGHT 06/28/2009   GERD 12/07/2008   Rheumatoid arthritis 12/07/2008   COLONIC POLYPS, HX OF 12/07/2008   Hyperlipidemia LDL goal <70 10/17/2006   HYPERCALCEMIA 10/17/2006   Mitral valve disease 10/17/2006     REFERRING PROVIDER:  [place], Raymond J, MD  REFERRING DIAG: [id number] (ICD-10-CM) - Greater trochanteric pain syndrome of right lower extremity            M54.50 (ICD-10-CM) - Right-sided low back pain without sciatica, unspecified chronicity  Rationale for Evaluation and Treatment: Rehabilitation  THERAPY DIAG:  Pain in right hip  Muscle weakness  (generalized)  Difficulty in walking, not elsewhere classified  Other low back pain  ONSET DATE: November 2023 Exacerbation  SUBJECTIVE:                                                                                                                                                                                           SUBJECTIVE STATEMENT: Pt had no increased pain after prior Rx but was very tired after prior Rx.  Pt gets fatigued and has limited tolerance to activity.  Pt is able to walk 1 mile with rest breaks.  Pt states she felt good after her prior round of therapy and went months without having sharp pains.      Pt took tylenol before she came in today due to her feet and lower legs.  Pt states she is in a rheumatoid flare currently and is having increased pain in lower legs and feet.  Pt states the medications can also cause fatigue.   Pt states she hasn't been having lumbar pain and is not having the sharp pains.      PERTINENT HISTORY:  RA, neuropathy, osteoporosis improved to ostepenia, OA, Hx of Lumbar radiculopathy and L LE pain, mild to moderate spinal stenosis C4-C6, and CAD. Lost her peripheral vision due to glaucoma   PAIN:  Are you having pain? yes Location: R posterior hip and lumbar NPRS:  0/10 current  Location:  "It's my rheumatoid".   Bilat lower legs and feet NPRS:  7-8/10 current   PRECAUTIONS: Other: Osteoporosis/osteopenia, RA, and hx of lumbar radiculopathy and L LE pain; lumbar anterolisthesis  WEIGHT BEARING RESTRICTIONS: No  FALLS:  Has patient fallen in last 6 months? No   OCCUPATION: Pt is retired  PLOF: Independent  PATIENT GOALS: improve strength, reduce pain, walk better   OBJECTIVE:   DIAGNOSTIC FINDINGS:  Lumbar MRI on 07/19/2020: IMPRESSION: 1. Multilevel disc and facet degeneration with levoscoliosis and L3-4, L4-5 anterolisthesis. 2. L4-5 left paracentral extrusion  with superior migration and impingement on the left L4  and L5 nerve roots. No other neural impingement, suspect this is the symptomatic finding. 3. Active facet arthritis on the left at L4-5.   TODAY'S TREATMENT:                                                                                                                                 Therapeutic Exercise: Reviewed pt presentation, pain level, and response to prior Rx. PPT 2x10  Bridges 2x10 Supine marching with PPT 2x10  Supine manual ITB stretch 2x20-30 sec bilat LAQ 2x10 bilat Sidestepping x 3 laps with and without UE support with SBA   Manual Therapy: Pt received STM and rolling to bilat glute, lateral hip,  ITB  in S/L'ing with pillow between knees to improve soft tissue tightness, mobility, and pain.    PATIENT EDUCATION:  Education details:  Rationale of exercises and MT, exercise form, POC, and dx. Person educated: Patient Education method: Explanation, Demonstration, verbal cues Education comprehension: verbalized understanding and needs further education, verbal cues required  HOME EXERCISE PROGRAM: Pt has a HEP  ASSESSMENT:  CLINICAL IMPRESSION: Pt reports she is currently in a rheumatoid flare.  Her back and hip are doing better, but her RA is causing  pain in lower legs and feet and fatigue.  PT is limiting exercises due to RA and fatigue.  Pt performed exercises well with cuing and instruction in correct form.  She responded well to Rx stating she feels good having no pain in hip and lumbar after Rx.  Pt should benefit from continued skilled PT services to address goals and impairments and to improve overall function.   OBJECTIVE IMPAIRMENTS: Abnormal gait, decreased activity tolerance, decreased balance, decreased endurance, decreased mobility, difficulty walking, decreased strength, and pain.   ACTIVITY LIMITATIONS: sitting, standing, transfers, and locomotion level  PARTICIPATION LIMITATIONS: community activity  PERSONAL FACTORS: Time since onset of  injury/illness/exacerbation and 3+ comorbidities: RA, neuropathy, osteoporosis/ostepenia, and OA  are also affecting patient's functional outcome.   REHAB POTENTIAL: Good  CLINICAL DECISION MAKING: Stable/uncomplicated  EVALUATION COMPLEXITY: Low   GOALS:   SHORT TERM GOALS: Target date: 09/02/2022   Pt will return to performing HEP and will be independent with HEP for improved pain, strength, and function.  Baseline: Goal status: INITIAL  2.  Pt will demo improved gait speed without increased pain.  Baseline:  Goal status: INITIAL  3.  Pt will be able to perform sit/stand transfers without difficultly and without increased pain.  Baseline:  Goal status: INITIAL  4.  Pt will report improved pain with ambulation.  Baseline:  Goal status: INITIAL    LONG TERM GOALS: Target date: 09/23/2022  Pt will report improved stability with gait and increased tolerance with walking program.   Baseline:  Goal status: INITIAL  2.  Pt will demo improved bilat hip and knee strength to 4+ to 5/5 MMT strength for improved  performance of functional mobility.   Baseline:  Goal status: INITIAL  3.  Pt will ambulate extended community distance without significant pain and difficulty.  Baseline:  Goal status: INITIAL   PLAN:  PT FREQUENCY: 2x/week  PT DURATION: 6 weeks  PLANNED INTERVENTIONS: Therapeutic exercises, Therapeutic activity, Neuromuscular re-education, Balance training, Gait training, Patient/Family education, Stair training, DME instructions, Aquatic Therapy, Dry Needling, Electrical stimulation, Cryotherapy, Moist heat, Taping, Ultrasound, Manual therapy, and Re-evaluation .  PLAN FOR NEXT SESSION: Cont with hip and core strengthening.  STW to bilat glutes and ITB.      Selinda Michaels III PT, DPT 09/03/22 4:51 PM

## 2022-09-03 ENCOUNTER — Encounter (HOSPITAL_BASED_OUTPATIENT_CLINIC_OR_DEPARTMENT_OTHER): Payer: Self-pay | Admitting: Physical Therapy

## 2022-09-04 ENCOUNTER — Ambulatory Visit (HOSPITAL_BASED_OUTPATIENT_CLINIC_OR_DEPARTMENT_OTHER): Payer: Medicare PPO | Admitting: Physical Therapy

## 2022-09-04 DIAGNOSIS — M25551 Pain in right hip: Secondary | ICD-10-CM | POA: Diagnosis not present

## 2022-09-04 DIAGNOSIS — M6281 Muscle weakness (generalized): Secondary | ICD-10-CM | POA: Diagnosis not present

## 2022-09-04 DIAGNOSIS — M5459 Other low back pain: Secondary | ICD-10-CM

## 2022-09-04 DIAGNOSIS — R262 Difficulty in walking, not elsewhere classified: Secondary | ICD-10-CM | POA: Diagnosis not present

## 2022-09-04 NOTE — Therapy (Signed)
OUTPATIENT PHYSICAL THERAPY TREATMENT NOTE   Patient Name: Felicia [name] MRN: [id number] DOB:1941/02/28, 82 y.o., female Today's Date: 09/05/2022  END OF SESSION:  PT End of Session - 09/04/22 1026     Visit Number 6    Number of Visits 12    Date for PT Re-Evaluation 09/23/22    Authorization Type [employer] M[employer]    PT Start Time [id number]    PT Stop Time 1020    PT Time Calculation (min) 43 min    Activity Tolerance Patient tolerated treatment well    Behavior During Therapy [facility] for tasks assessed/performed              Past Medical History:  Diagnosis Date   Adenomatous colon polyp 2004   Colo in 2009 "no polyps"   Complication of anesthesia    small airway per patient   Echocardiogram    Echo 8/22 EF 60-65, no RWMA, mild LVH, normal RVSF, moderately elevated PASP (RVSP 50.9), trivial MR, mild AI, AV sclerosis without stenosis   GERD (gastroesophageal reflux disease)    Glaucoma    Hiatal hernia    Hypertension    Neuropathy    Nuclear stress test    Myoview 8/22: EF 65, no ischemia; low risk   Rheumatoid arthritis(714.0)    Orencia; Methotrexate (Dr. Patrecia Pour)   Sleep apnea    on CPAP   Past Surgical History:  Procedure Laterality Date   CARDIAC CATHETERIZATION N/A 03/29/2015   Procedure: Left Heart Cath and Coronary Angiography;  Surgeon: Belva Crome, MD;  Location: [place] [facility];  Service: Cardiovascular;  Laterality: N/A;   CATARACT EXTRACTION, BILATERAL     COLONOSCOPY W/ POLYPECTOMY     X1; negative subsequently   DILATION AND CURETTAGE OF UTERUS     EYE SURGERY     due to RA   EYE SURGERY Left 06/2017   eyelid tendon    HYSTEROSCOPY WITH D & C  06/24/2012   Procedure: DILATATION AND CURETTAGE /HYSTEROSCOPY;  Surgeon: [name], MD;  Location: [facility];  Service: Gynecology;  Laterality: N/A;   SHOULDER SURGERY Left    due to RA   SQUAMOUS CELL CARCINOMA EXCISION     scalp and left leg   TRABECULECTOMY     X 2 OD; X 1 OS   UPPER  GASTROINTESTINAL ENDOSCOPY     hiatal hernia   Patient Active Problem List   Diagnosis Date Noted   Right-sided low back pain without sciatica 06/21/2022   Coronary artery disease involving native coronary artery with angina pectoris 04/08/2022   Pulmonary HTN 04/08/2022   Shortness of breath 04/08/2022   Greater trochanteric pain syndrome of right lower extremity 10/23/2021   Age-related osteoporosis without current pathological fracture 06/26/2021   Aplastic anemia 06/26/2021   Hiatal hernia 06/26/2021   Prediabetes 06/26/2021   Neuropathy 06/16/2020   SIADH (syndrome of inappropriate ADH production) 02/18/2020   Hyponatremia 11/18/2019   Thrombocytosis 08/01/2018   URI (upper respiratory infection) 04/14/2017   Primary osteoarthritis of both knees 03/06/2017   Exertional chest pain    Pain in the chest 03/27/2015   Postural hypotension 05/26/2013   OSA (obstructive sleep apnea) 03/22/2013   Lens replaced by other means 08/25/2012   Low tension open-angle glaucoma(365.12) 02/14/2012   Osteoarthritis 11/07/2011   Abdominal bruit 10/16/2011   Essential hypertension 08/13/2011   Anisocoria 08/13/2011   Cervical radiculopathy 07/17/2011   DDD (degenerative disc disease), cervical 07/10/2011   Glaucoma 06/06/2011  HIATAL HERNIA 01/30/2010   POSTMENOPAUSAL SYNDROME 12/14/2009   EUSTACHIAN TUBE DYSFUNCTION, RIGHT 06/28/2009   GERD 12/07/2008   Rheumatoid arthritis 12/07/2008   COLONIC POLYPS, HX OF 12/07/2008   Hyperlipidemia LDL goal <70 10/17/2006   HYPERCALCEMIA 10/17/2006   Mitral valve disease 10/17/2006     REFERRING PROVIDER:  de [place], Raymond J, MD  REFERRING DIAG: [phone] (ICD-10-CM) - Greater trochanteric pain syndrome of right lower extremity            M54.50 (ICD-10-CM) - Right-sided low back pain without sciatica, unspecified chronicity  Rationale for Evaluation and Treatment: Rehabilitation  THERAPY DIAG:  Pain in right hip  Muscle weakness  (generalized)  Difficulty in walking, not elsewhere classified  Other low back pain  ONSET DATE: November 2023 Exacerbation  SUBJECTIVE:                                                                                                                                                                                           SUBJECTIVE STATEMENT: Pt gets fatigued and has limited tolerance to activity.  Pt is able to walk 1 mile with rest breaks.  Pt states she felt good after her prior round of therapy and went months without having sharp pains.      Pt states she is not hurting as much today.  Pt thinks she is "coming out of the fog".  Pt states the cramping and burning in her lower legs disturbs her sleep every night.  Pt states she felt ok after prior Rx.  Pt reports compliance with HEP.   PERTINENT HISTORY:  RA, neuropathy, osteoporosis improved to ostepenia, OA, Hx of Lumbar radiculopathy and L LE pain, mild to moderate spinal stenosis C4-C6, and CAD. Lost her peripheral vision due to glaucoma   PAIN:  Are you having pain? yes Location: R posterior hip and lumbar NPRS:  0/10 current  Location:  Bilat lower legs and feet NPRS:  4-5/10 current   PRECAUTIONS: Other: Osteoporosis/osteopenia, RA, and hx of lumbar radiculopathy and L LE pain; lumbar anterolisthesis  WEIGHT BEARING RESTRICTIONS: No  FALLS:  Has patient fallen in last 6 months? No   OCCUPATION: Pt is retired  PLOF: Independent  PATIENT GOALS: improve strength, reduce pain, walk better   OBJECTIVE:   DIAGNOSTIC FINDINGS:  Lumbar MRI on 07/19/2020: IMPRESSION: 1. Multilevel disc and facet degeneration with levoscoliosis and L3-4, L4-5 anterolisthesis. 2. L4-5 left paracentral extrusion with superior migration and impingement on the left L4 and L5 nerve roots. No other neural impingement, suspect this is the symptomatic finding. 3. Active facet arthritis on the left at L4-5.   TODAY'S  TREATMENT:                                                                                                                                  Therapeutic Exercise: Reviewed pt presentation, pain level, and response to prior Rx. Nustep at L3 bilat LE's x 5 mins PPT 2x10  Bridges 2x10 Supine marching with PPT 2x10  Supine clams red TB 2 x 10 reps  Supine manual ITB stretch 2x20-30 sec bilat LAQ 2x10 bilat with 1.5# Sidestepping x 3 laps with and without UE support with SBA Marching on airex with and without UE support   Manual Therapy: Pt received STM and rolling to R glute, lateral hip,  ITB  in L S/L'ing with pillow between knees to improve soft tissue tightness, mobility, and pain.    PATIENT EDUCATION:  Education details:  Rationale of exercises and MT, exercise form, POC, and dx. Person educated: Patient Education method: Explanation, Demonstration, verbal cues Education comprehension: verbalized understanding and needs further education, verbal cues required  HOME EXERCISE PROGRAM: Pt has a HEP  ASSESSMENT:  CLINICAL IMPRESSION: Pt presents to Rx feeling better today.  She feels that she is getting stronger with the exercises in PT.  Since pt was feeling better today, PT did minimally progress exercises.  She performed exercises well with cuing and instruction for correct form and tolerated exercises well.  Pt also tolerated STM well and continues to have tenderness in glute and ITB.  Pt responded well to Rx stating she feels good having no pain in hip and lumbar after Rx.  Pt should benefit from continued skilled PT services to address goals and impairments and to improve overall function.   OBJECTIVE IMPAIRMENTS: Abnormal gait, decreased activity tolerance, decreased balance, decreased endurance, decreased mobility, difficulty walking, decreased strength, and pain.   ACTIVITY LIMITATIONS: sitting, standing, transfers, and locomotion level  PARTICIPATION LIMITATIONS: community activity  PERSONAL  FACTORS: Time since onset of injury/illness/exacerbation and 3+ comorbidities: RA, neuropathy, osteoporosis/ostepenia, and OA  are also affecting patient's functional outcome.   REHAB POTENTIAL: Good  CLINICAL DECISION MAKING: Stable/uncomplicated  EVALUATION COMPLEXITY: Low   GOALS:   SHORT TERM GOALS: Target date: 09/02/2022   Pt will return to performing HEP and will be independent with HEP for improved pain, strength, and function.  Baseline: Goal status: INITIAL  2.  Pt will demo improved gait speed without increased pain.  Baseline:  Goal status: INITIAL  3.  Pt will be able to perform sit/stand transfers without difficultly and without increased pain.  Baseline:  Goal status: INITIAL  4.  Pt will report improved pain with ambulation.  Baseline:  Goal status: INITIAL    LONG TERM GOALS: Target date: 09/23/2022  Pt will report improved stability with gait and increased tolerance with walking program.   Baseline:  Goal status: INITIAL  2.  Pt will demo improved bilat hip and knee strength to 4+ to 5/5 MMT  strength for improved performance of functional mobility.   Baseline:  Goal status: INITIAL  3.  Pt will ambulate extended community distance without significant pain and difficulty.  Baseline:  Goal status: INITIAL   PLAN:  PT FREQUENCY: 2x/week  PT DURATION: 6 weeks  PLANNED INTERVENTIONS: Therapeutic exercises, Therapeutic activity, Neuromuscular re-education, Balance training, Gait training, Patient/Family education, Stair training, DME instructions, Aquatic Therapy, Dry Needling, Electrical stimulation, Cryotherapy, Moist heat, Taping, Ultrasound, Manual therapy, and Re-evaluation .  PLAN FOR NEXT SESSION: Cont with hip and core strengthening.  STW to bilat glutes and ITB.      Selinda Michaels III PT, DPT 09/05/22 11:03 AM

## 2022-09-05 ENCOUNTER — Encounter (HOSPITAL_BASED_OUTPATIENT_CLINIC_OR_DEPARTMENT_OTHER): Payer: Self-pay | Admitting: Physical Therapy

## 2022-09-10 ENCOUNTER — Encounter (HOSPITAL_BASED_OUTPATIENT_CLINIC_OR_DEPARTMENT_OTHER): Payer: Self-pay | Admitting: Physical Therapy

## 2022-09-10 ENCOUNTER — Ambulatory Visit (HOSPITAL_BASED_OUTPATIENT_CLINIC_OR_DEPARTMENT_OTHER): Payer: Medicare PPO | Admitting: Physical Therapy

## 2022-09-10 DIAGNOSIS — M5459 Other low back pain: Secondary | ICD-10-CM | POA: Diagnosis not present

## 2022-09-10 DIAGNOSIS — M6281 Muscle weakness (generalized): Secondary | ICD-10-CM | POA: Diagnosis not present

## 2022-09-10 DIAGNOSIS — M25551 Pain in right hip: Secondary | ICD-10-CM | POA: Diagnosis not present

## 2022-09-10 DIAGNOSIS — R262 Difficulty in walking, not elsewhere classified: Secondary | ICD-10-CM | POA: Diagnosis not present

## 2022-09-10 NOTE — Therapy (Signed)
OUTPATIENT PHYSICAL THERAPY TREATMENT NOTE   Patient Name: Felicia [name] MRN: [id number] DOB:[birth year], 82 y.o., female Today's Date: 09/10/2022  END OF SESSION:     Past Medical History:  Diagnosis Date   Adenomatous colon polyp 2004   Colo in 2009 "no polyps"   Complication of anesthesia    small airway per patient   Echocardiogram    Echo 8/22 EF 60-65, no RWMA, mild LVH, normal RVSF, moderately elevated PASP (RVSP 50.9), trivial MR, mild AI, AV sclerosis without stenosis   GERD (gastroesophageal reflux disease)    Glaucoma    Hiatal hernia    Hypertension    Neuropathy    Nuclear stress test    Myoview 8/22: EF 65, no ischemia; low risk   Rheumatoid arthritis(714.0)    Orencia; Methotrexate (Dr. Titus Dubin)   Sleep apnea    on CPAP   Past Surgical History:  Procedure Laterality Date   CARDIAC CATHETERIZATION N/A 03/29/2015   Procedure: Left Heart Cath and Coronary Angiography;  Surgeon: Lyn Records, MD;  Location: [facility];  Service: Cardiovascular;  Laterality: N/A;   CATARACT EXTRACTION, BILATERAL     COLONOSCOPY W/ POLYPECTOMY     X1; negative subsequently   DILATION AND CURETTAGE OF UTERUS     EYE SURGERY     due to RA   EYE SURGERY Left 06/2017   eyelid tendon    HYSTEROSCOPY WITH D & C  06/24/2012   Procedure: DILATATION AND CURETTAGE /HYSTEROSCOPY;  Surgeon: Loney Laurence, MD;  Location: [facility];  Service: Gynecology;  Laterality: N/A;   SHOULDER SURGERY Left    due to RA   SQUAMOUS CELL CARCINOMA EXCISION     scalp and left leg   TRABECULECTOMY     X 2 OD; X 1 OS   UPPER GASTROINTESTINAL ENDOSCOPY     hiatal hernia   Patient Active Problem List   Diagnosis Date Noted   Right-sided low back pain without sciatica 06/21/2022   Coronary artery disease involving native coronary artery with angina pectoris 04/08/2022   Pulmonary HTN 04/08/2022   Shortness of breath 04/08/2022   Greater trochanteric pain syndrome of right lower extremity  10/23/2021   Age-related osteoporosis without current pathological fracture 06/26/2021   Aplastic anemia 06/26/2021   Hiatal hernia 06/26/2021   Prediabetes 06/26/2021   Neuropathy 06/16/2020   SIADH (syndrome of inappropriate ADH production) 02/18/2020   Hyponatremia 11/18/2019   Thrombocytosis 08/01/2018   URI (upper respiratory infection) 04/14/2017   Primary osteoarthritis of both knees 03/06/2017   Exertional chest pain    Pain in the chest 03/27/2015   Postural hypotension 05/26/2013   OSA (obstructive sleep apnea) 03/22/2013   Lens replaced by other means 08/25/2012   Low tension open-angle glaucoma(365.12) 02/14/2012   Osteoarthritis 11/07/2011   Abdominal bruit 10/16/2011   Essential hypertension 08/13/2011   Anisocoria 08/13/2011   Cervical radiculopathy 07/17/2011   DDD (degenerative disc disease), cervical 07/10/2011   Glaucoma 06/06/2011   HIATAL HERNIA 01/30/2010   POSTMENOPAUSAL SYNDROME 12/14/2009   EUSTACHIAN TUBE DYSFUNCTION, RIGHT 06/28/2009   GERD 12/07/2008   Rheumatoid arthritis 12/07/2008   COLONIC POLYPS, HX OF 12/07/2008   Hyperlipidemia LDL goal <70 10/17/2006   HYPERCALCEMIA 10/17/2006   Mitral valve disease 10/17/2006     REFERRING PROVIDER:  de [place], Raymond J, MD  REFERRING DIAG: M25.551 (ICD-10-CM) - Greater trochanteric pain syndrome of right lower extremity            M54.50 (  ICD-10-CM) - Right-sided low back pain without sciatica, unspecified chronicity  Rationale for Evaluation and Treatment: Rehabilitation  THERAPY DIAG:  No diagnosis found.  ONSET DATE: November 2023 Exacerbation  SUBJECTIVE:                                                                                                                                                                                           SUBJECTIVE STATEMENT: "I think I'm doing pretty well."  Pt has neuropathy pain at night.  Pt denies any adverse effects after prior Rx just fatigue.  t gets  fatigued and has limited tolerance to activity.  Pt is able to walk 1 mile with rest breaks.  Pt states she felt good after her prior round of therapy and went months without having sharp pains.      Pt states she is not hurting as much today.  Pt thinks she is "coming out of the fog".  Pt states the cramping and burning in her lower legs disturbs her sleep every night.  Pt states she felt ok after prior Rx.  Pt reports compliance with HEP.   PERTINENT HISTORY:  RA, neuropathy, osteoporosis improved to ostepenia, OA, Hx of Lumbar radiculopathy and L LE pain, mild to moderate spinal stenosis C4-C6, and CAD. Lost her peripheral vision due to glaucoma   PAIN:  Are you having pain? yes Location: R posterior hip and lumbar NPRS:  0/10 current Pt denies pain in hips though states it's tender  Location:  Bilat lower legs and feet NPRS:  6-7/10 current Type:  aching, burning   PRECAUTIONS: Other: Osteoporosis/osteopenia, RA, and hx of lumbar radiculopathy and L LE pain; lumbar anterolisthesis  WEIGHT BEARING RESTRICTIONS: No  FALLS:  Has patient fallen in last 6 months? No   OCCUPATION: Pt is retired  PLOF: Independent  PATIENT GOALS: improve strength, reduce pain, walk better   OBJECTIVE:   DIAGNOSTIC FINDINGS:  Lumbar MRI on 07/19/2020: IMPRESSION: 1. Multilevel disc and facet degeneration with levoscoliosis and L3-4, L4-5 anterolisthesis. 2. L4-5 left paracentral extrusion with superior migration and impingement on the left L4 and L5 nerve roots. No other neural impingement, suspect this is the symptomatic finding. 3. Active facet arthritis on the left at L4-5.   TODAY'S TREATMENT:  Therapeutic Exercise: Reviewed pt presentation, pain level, and response to prior Rx. Nustep at L3 bilat LE's x 5 mins PPT 2x10  Bridges 2x10 Supine marching with  PPT 2x10  Supine clams GTB 2 x 10 reps LAQ 2x10 bilat with 2# Sidestepping with YTB around thighs x 2 laps with and without UE support with SBA Marching on airex with and without UE support 2x10   Manual Therapy: Pt received STM and rolling to R glute, lateral hip,  ITB  in L S/L'ing with pillow between knees to improve soft tissue tightness, mobility, and pain.    PATIENT EDUCATION:  Education details:  Rationale of exercises and MT, exercise form, POC, and dx. Person educated: Patient Education method: Explanation, Demonstration, verbal cues Education comprehension: verbalized understanding and needs further education, verbal cues required  HOME EXERCISE PROGRAM: Pt has a HEP  ASSESSMENT:  CLINICAL IMPRESSION: Pt presents to Rx feeling better today.  She feels that she is getting stronger with the exercises in PT.  Since pt was feeling better today, PT did minimally progress exercises.  She performed exercises well with cuing and instruction for correct form and tolerated exercises well.  Pt also tolerated STM well and continues to have tenderness in glute and ITB.  Pt responded well to Rx stating she feels good having no pain in hip and lumbar after Rx.  Pt should benefit from continued skilled PT services to address goals and impairments and to improve overall function.   OBJECTIVE IMPAIRMENTS: Abnormal gait, decreased activity tolerance, decreased balance, decreased endurance, decreased mobility, difficulty walking, decreased strength, and pain.   ACTIVITY LIMITATIONS: sitting, standing, transfers, and locomotion level  PARTICIPATION LIMITATIONS: community activity  PERSONAL FACTORS: Time since onset of injury/illness/exacerbation and 3+ comorbidities: RA, neuropathy, osteoporosis/ostepenia, and OA  are also affecting patient's functional outcome.   REHAB POTENTIAL: Good  CLINICAL DECISION MAKING: Stable/uncomplicated  EVALUATION COMPLEXITY: Low   GOALS:   SHORT TERM  GOALS: Target date: 09/02/2022   Pt will return to performing HEP and will be independent with HEP for improved pain, strength, and function.  Baseline: Goal status: INITIAL  2.  Pt will demo improved gait speed without increased pain.  Baseline:  Goal status: INITIAL  3.  Pt will be able to perform sit/stand transfers without difficultly and without increased pain.  Baseline:  Goal status: INITIAL  4.  Pt will report improved pain with ambulation.  Baseline:  Goal status: INITIAL    LONG TERM GOALS: Target date: 09/23/2022  Pt will report improved stability with gait and increased tolerance with walking program.   Baseline:  Goal status: INITIAL  2.  Pt will demo improved bilat hip and knee strength to 4+ to 5/5 MMT strength for improved performance of functional mobility.   Baseline:  Goal status: INITIAL  3.  Pt will ambulate extended community distance without significant pain and difficulty.  Baseline:  Goal status: INITIAL   PLAN:  PT FREQUENCY: 2x/week  PT DURATION: 6 weeks  PLANNED INTERVENTIONS: Therapeutic exercises, Therapeutic activity, Neuromuscular re-education, Balance training, Gait training, Patient/Family education, Stair training, DME instructions, Aquatic Therapy, Dry Needling, Electrical stimulation, Cryotherapy, Moist heat, Taping, Ultrasound, Manual therapy, and Re-evaluation .  PLAN FOR NEXT SESSION: Cont with hip and core strengthening.  STW to bilat glutes and ITB.      Audie Clear III PT, DPT 09/10/22 9:38 AM

## 2022-09-12 DIAGNOSIS — H401233 Low-tension glaucoma, bilateral, severe stage: Secondary | ICD-10-CM | POA: Diagnosis not present

## 2022-09-12 DIAGNOSIS — H04123 Dry eye syndrome of bilateral lacrimal glands: Secondary | ICD-10-CM | POA: Diagnosis not present

## 2022-09-24 ENCOUNTER — Other Ambulatory Visit: Payer: Medicare PPO | Admitting: Pharmacist

## 2022-09-24 NOTE — Progress Notes (Signed)
09/24/2022 Name: Felicia [name] MRN: [id number] DOB: 03-12-1941   Felicia [name] is a 82 y.o. year old female who presented for a telephone visit.   They were referred to the pharmacist by a quality report for assistance in managing  quality metrics: controlling blood pressure (CBP) .   Subjective:  Care Team: Primary Care Provider: Sigmund Hazel, MD  Medication Access/Adherence  Current Pharmacy:  [facility] [address] - [place], [place] - [phone] [place] [address] Phone: [phone] Fax: [phone]  [id number] [place], [place] - [address] AT [facility] [address] [facility] [address] & [facility] [address] [place] [address] Phone: [phone] Fax: [phone]  [facility] - [address] [place] - [address] [place] [address] Phone: [phone] Fax: [phone]  [facility] - [place], [place] - [address] [place] [address] Phone: [phone] Fax: [phone]    Hypertension:  Current medications: metoprolol  BID, losartan  daily, imdur  daily  Patient has a validated, automated, upper arm home BP cuff Current blood pressure readings readings: 138/53  Patient denies hypotensive s/sx including dizziness, lightheadedness.  Patient denies hypertensive symptoms including headache, chest pain, shortness of breath   Objective:  Lab Results  Component Value Date   HGBA1C 5.9 (H) 02/25/2018    Lab Results  Component Value Date   CREATININE 0.96 (H) 07/26/2022   BUN 21 07/26/2022   NA 130 (L) 07/26/2022   K 5.2 07/26/2022   CL 98 07/26/2022   CO2 23 07/26/2022    Lab Results  Component Value Date   CHOL 154 07/31/2021   HDL 60 07/31/2021   LDLCALC 78 07/31/2021   LDLDIRECT 160.4 12/14/2009   TRIG 84 07/31/2021   CHOLHDL 2.6 07/31/2021    Medications Reviewed Today      Reviewed by Susy Manor, PT (Physical Therapist) on 09/10/22 at [phone]  Med List Status: <None>   Medication Order Taking? Sig Documenting Provider Last Dose Status Informant  Apoaequorin (PREVAGEN PO) [id number] No Take 1 tablet by mouth daily. [provider] Taking Active   Artificial Tear Solution (GENTEAL TEARS OP) [id number] No Apply to eye. [provider] Taking Active   Bempedoic Acid-Ezetimibe (NEXLIZET) 180-10 MG TABS [id number] No Take 1 tablet by mouth daily. Lyn Records, MD Taking Active   Carboxymethylcellulose Sodium (REFRESH OP) [id number] No Apply 1 drop to eye daily as needed (FOR DRY EYES). [provider] Taking Active Self  Cyanocobalamin (VITAMIN B-12) 2500 MCG SUBL [id number] No Place 1 each under the tongue daily. [provider] Taking Active   erythromycin ophthalmic ointment [id number] No 1 Application at bedtime. [provider] Taking Active   esomeprazole (NEXIUM) 40 MG capsule [id number] No TAKE 1 CAPSULE BY MOUTH TWICE DAILY BEFORE MEALS Iva Boop, MD Taking Active   estradiol (ESTRACE) 1 MG tablet [id number] No Take 1 mg by mouth daily. [provider] Taking Active Self           Med Note Kai Levins, MELISSA B   Tue Mar 28, 2015 12:58 PM)    folic acid (FOLVITE) 1 MG tablet [id number] No TAKE 2 TABLETS(2 MG) BY MOUTH DAILY Gearldine Bienenstock, PA-C Taking Active   gabapentin (NEURONTIN) 100 MG capsule [id number] No Take 3 capsules (300 mg total) by mouth at bedtime. Lomax, Amy, NP Taking Active   isosorbide mononitrate (IMDUR) 30 MG  24 hr tablet [id number] No Take 1 tablet (30 mg total) by mouth daily. Take 15 mg daily for 3 days then take 30 mg daily Weaver, Scott T, PA-C Taking Active   latanoprost (XALATAN) 0.005 % ophthalmic solution [id number] No Place 1 drop into both eyes at bedtime. [provider] Taking Active Self  losartan (COZAAR) 100 MG tablet [id number] No Take 100 mg by mouth daily. [provider] Taking Active Self  methotrexate (RHEUMATREX) 2.5 MG tablet [id number]  TAKE 4 TABLETS BY MOUTH 1 TIME A WEEK Gearldine Bienenstock, PA-C  Active   metoprolol tartrate (LOPRESSOR) 25 MG tablet [id number] No Take 1 tablet (25 mg total) by mouth 2 (two) times daily. Tereso Newcomer T, PA-C Taking Active   metroNIDAZOLE (METROGEL) 0.75 % gel [id number] No Apply 1 application topically 2 (two) times daily.  [provider] Taking Active   moxifloxacin (VIGAMOX) 0.5 % ophthalmic solution [id number] No Apply to eye. [provider] Taking Active   Polyethylene Glycol 3350 (MIRALAX PO) [id number] No Take 1 Dose by mouth as needed. [provider] Taking Active   progesterone (PROMETRIUM) 200 MG capsule [id number] No Take 200 mg by mouth daily. [provider] Taking Active Self  progesterone (PROMETRIUM) 200 MG capsule [id number] No Take 200 mg by mouth daily. [provider] Taking Active   RINVOQ 15 [place] AT55 [id number] No TAKE 1 TABLET BY MOUTH EVERY DAY Gearldine Bienenstock, PA-C Taking Active   timolol (TIMOPTIC) 0.5 % ophthalmic solution [id number] No Apply to eye. [provider] Taking Active   valACYclovir (VALTREX) 1000 MG tablet [id number] No Take 1,000 mg by mouth as needed (fever blister). Reported on 06/14/2015 [provider] Taking Active Self  Wheat Dextrin (BENEFIBER DRINK MIX PO) [id number] No Take 1 Scoop by mouth at bedtime. [provider] Taking Active   Zoledronic Acid (RECLAST IV) [id number] No Inject into the vein. [provider] Taking Active               Assessment/Plan:   Hypertension: - Currently controlled based on home readings, likely a white coat hypertension for office readings. - Reviewed long term cardiovascular and renal outcomes of uncontrolled blood pressure - Reviewed appropriate blood pressure monitoring technique and reviewed goal blood pressure. Recommended to check home blood pressure  and heart rate as part of her existing routine - Recommend to continue current regimen     Lynnda Shields, PharmD, BCPS Clinical Pharmacist [facility]

## 2022-09-26 DIAGNOSIS — Z85828 Personal history of other malignant neoplasm of skin: Secondary | ICD-10-CM | POA: Diagnosis not present

## 2022-09-26 DIAGNOSIS — D225 Melanocytic nevi of trunk: Secondary | ICD-10-CM | POA: Diagnosis not present

## 2022-09-26 DIAGNOSIS — B353 Tinea pedis: Secondary | ICD-10-CM | POA: Diagnosis not present

## 2022-09-26 DIAGNOSIS — C44622 Squamous cell carcinoma of skin of right upper limb, including shoulder: Secondary | ICD-10-CM | POA: Diagnosis not present

## 2022-09-26 DIAGNOSIS — B078 Other viral warts: Secondary | ICD-10-CM | POA: Diagnosis not present

## 2022-09-26 DIAGNOSIS — D2372 Other benign neoplasm of skin of left lower limb, including hip: Secondary | ICD-10-CM | POA: Diagnosis not present

## 2022-09-26 DIAGNOSIS — D485 Neoplasm of uncertain behavior of skin: Secondary | ICD-10-CM | POA: Diagnosis not present

## 2022-09-26 DIAGNOSIS — L578 Other skin changes due to chronic exposure to nonionizing radiation: Secondary | ICD-10-CM | POA: Diagnosis not present

## 2022-09-26 DIAGNOSIS — L821 Other seborrheic keratosis: Secondary | ICD-10-CM | POA: Diagnosis not present

## 2022-09-30 ENCOUNTER — Encounter (HOSPITAL_BASED_OUTPATIENT_CLINIC_OR_DEPARTMENT_OTHER): Payer: Self-pay

## 2022-09-30 ENCOUNTER — Ambulatory Visit (HOSPITAL_BASED_OUTPATIENT_CLINIC_OR_DEPARTMENT_OTHER): Payer: Medicare PPO | Admitting: Physical Therapy

## 2022-10-02 DIAGNOSIS — Z6823 Body mass index (BMI) 23.0-23.9, adult: Secondary | ICD-10-CM | POA: Diagnosis not present

## 2022-10-02 DIAGNOSIS — E222 Syndrome of inappropriate secretion of antidiuretic hormone: Secondary | ICD-10-CM | POA: Diagnosis not present

## 2022-10-02 DIAGNOSIS — I10 Essential (primary) hypertension: Secondary | ICD-10-CM | POA: Diagnosis not present

## 2022-10-02 DIAGNOSIS — D6489 Other specified anemias: Secondary | ICD-10-CM | POA: Diagnosis not present

## 2022-10-02 DIAGNOSIS — R7303 Prediabetes: Secondary | ICD-10-CM | POA: Diagnosis not present

## 2022-10-02 DIAGNOSIS — Z862 Personal history of diseases of the blood and blood-forming organs and certain disorders involving the immune mechanism: Secondary | ICD-10-CM | POA: Diagnosis not present

## 2022-10-02 DIAGNOSIS — M810 Age-related osteoporosis without current pathological fracture: Secondary | ICD-10-CM | POA: Diagnosis not present

## 2022-10-02 DIAGNOSIS — M0550 Rheumatoid polyneuropathy with rheumatoid arthritis of unspecified site: Secondary | ICD-10-CM | POA: Diagnosis not present

## 2022-10-02 DIAGNOSIS — I700 Atherosclerosis of aorta: Secondary | ICD-10-CM | POA: Diagnosis not present

## 2022-10-02 DIAGNOSIS — Z79899 Other long term (current) drug therapy: Secondary | ICD-10-CM | POA: Diagnosis not present

## 2022-10-03 ENCOUNTER — Ambulatory Visit (HOSPITAL_BASED_OUTPATIENT_CLINIC_OR_DEPARTMENT_OTHER): Payer: Medicare PPO | Attending: Family Medicine | Admitting: Physical Therapy

## 2022-10-03 DIAGNOSIS — E222 Syndrome of inappropriate secretion of antidiuretic hormone: Secondary | ICD-10-CM | POA: Diagnosis present

## 2022-10-03 DIAGNOSIS — M25551 Pain in right hip: Secondary | ICD-10-CM | POA: Insufficient documentation

## 2022-10-03 DIAGNOSIS — M170 Bilateral primary osteoarthritis of knee: Secondary | ICD-10-CM | POA: Diagnosis present

## 2022-10-03 DIAGNOSIS — M19041 Primary osteoarthritis, right hand: Secondary | ICD-10-CM | POA: Insufficient documentation

## 2022-10-03 DIAGNOSIS — Z86010 Personal history of colonic polyps: Secondary | ICD-10-CM | POA: Diagnosis present

## 2022-10-03 DIAGNOSIS — M5136 Other intervertebral disc degeneration, lumbar region: Secondary | ICD-10-CM | POA: Insufficient documentation

## 2022-10-03 DIAGNOSIS — I10 Essential (primary) hypertension: Secondary | ICD-10-CM | POA: Diagnosis present

## 2022-10-03 DIAGNOSIS — Z8589 Personal history of malignant neoplasm of other organs and systems: Secondary | ICD-10-CM | POA: Insufficient documentation

## 2022-10-03 DIAGNOSIS — Z111 Encounter for screening for respiratory tuberculosis: Secondary | ICD-10-CM | POA: Insufficient documentation

## 2022-10-03 DIAGNOSIS — Z8639 Personal history of other endocrine, nutritional and metabolic disease: Secondary | ICD-10-CM | POA: Insufficient documentation

## 2022-10-03 DIAGNOSIS — M5030 Other cervical disc degeneration, unspecified cervical region: Secondary | ICD-10-CM | POA: Insufficient documentation

## 2022-10-03 DIAGNOSIS — M19042 Primary osteoarthritis, left hand: Secondary | ICD-10-CM | POA: Diagnosis not present

## 2022-10-03 DIAGNOSIS — I2510 Atherosclerotic heart disease of native coronary artery without angina pectoris: Secondary | ICD-10-CM | POA: Insufficient documentation

## 2022-10-03 DIAGNOSIS — Z8719 Personal history of other diseases of the digestive system: Secondary | ICD-10-CM | POA: Insufficient documentation

## 2022-10-03 DIAGNOSIS — M0579 Rheumatoid arthritis with rheumatoid factor of multiple sites without organ or systems involvement: Secondary | ICD-10-CM | POA: Insufficient documentation

## 2022-10-03 DIAGNOSIS — Z8669 Personal history of other diseases of the nervous system and sense organs: Secondary | ICD-10-CM | POA: Diagnosis present

## 2022-10-03 DIAGNOSIS — M7061 Trochanteric bursitis, right hip: Secondary | ICD-10-CM | POA: Insufficient documentation

## 2022-10-03 DIAGNOSIS — M5459 Other low back pain: Secondary | ICD-10-CM | POA: Insufficient documentation

## 2022-10-03 DIAGNOSIS — G629 Polyneuropathy, unspecified: Secondary | ICD-10-CM | POA: Insufficient documentation

## 2022-10-03 DIAGNOSIS — Z79899 Other long term (current) drug therapy: Secondary | ICD-10-CM | POA: Insufficient documentation

## 2022-10-03 DIAGNOSIS — M810 Age-related osteoporosis without current pathological fracture: Secondary | ICD-10-CM | POA: Insufficient documentation

## 2022-10-03 DIAGNOSIS — M6281 Muscle weakness (generalized): Secondary | ICD-10-CM | POA: Diagnosis not present

## 2022-10-03 DIAGNOSIS — R262 Difficulty in walking, not elsewhere classified: Secondary | ICD-10-CM | POA: Diagnosis not present
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
## 2022-10-04 ENCOUNTER — Encounter (HOSPITAL_BASED_OUTPATIENT_CLINIC_OR_DEPARTMENT_OTHER): Payer: Self-pay | Admitting: Physical Therapy

## 2022-10-07 ENCOUNTER — Other Ambulatory Visit: Payer: Self-pay | Admitting: *Deleted

## 2022-10-07 DIAGNOSIS — Z79899 Other long term (current) drug therapy: Secondary | ICD-10-CM

## 2022-10-08 LAB — CBC WITH DIFFERENTIAL/PLATELET
Absolute Monocytes: 666 cells/uL (ref 200–950)
Basophils Absolute: 37 cells/uL (ref 0–200)
Basophils Relative: 0.5 %
Eosinophils Absolute: 104 cells/uL (ref 15–500)
Eosinophils Relative: 1.4 %
HCT: 32.2 % — ABNORMAL LOW (ref 35.0–45.0)
Hemoglobin: 10.5 g/dL — ABNORMAL LOW (ref 11.7–15.5)
Lymphs Abs: 1406 cells/uL (ref 850–3900)
MCH: 32.1 pg (ref 27.0–33.0)
MCHC: 32.6 g/dL (ref 32.0–36.0)
MCV: 98.5 fL (ref 80.0–100.0)
MPV: 9.5 fL (ref 7.5–12.5)
Monocytes Relative: 9 %
Neutro Abs: 5187 cells/uL (ref 1500–7800)
Neutrophils Relative %: 70.1 %
Platelets: 536 10*3/uL — ABNORMAL HIGH (ref 140–400)
RBC: 3.27 10*6/uL — ABNORMAL LOW (ref 3.80–5.10)
RDW: 13 % (ref 11.0–15.0)
Total Lymphocyte: 19 %
WBC: 7.4 10*3/uL (ref 3.8–10.8)

## 2022-10-08 LAB — COMPLETE METABOLIC PANEL WITH GFR
AG Ratio: 1.8 (calc) (ref 1.0–2.5)
ALT: 16 U/L (ref 6–29)
AST: 24 U/L (ref 10–35)
Albumin: 4 g/dL (ref 3.6–5.1)
Alkaline phosphatase (APISO): 34 U/L — ABNORMAL LOW (ref 37–153)
BUN: 18 mg/dL (ref 7–25)
CO2: 23 mmol/L (ref 20–32)
Calcium: 9.5 mg/dL (ref 8.6–10.4)
Chloride: 93 mmol/L — ABNORMAL LOW (ref 98–110)
Creat: 0.78 mg/dL (ref 0.60–0.95)
Globulin: 2.2 g/dL (calc) (ref 1.9–3.7)
Glucose, Bld: 79 mg/dL (ref 65–99)
Potassium: 5.1 mmol/L (ref 3.5–5.3)
Sodium: 123 mmol/L — ABNORMAL LOW (ref 135–146)
Total Bilirubin: 0.4 mg/dL (ref 0.2–1.2)
Total Protein: 6.2 g/dL (ref 6.1–8.1)
eGFR: 77 mL/min/{1.73_m2} (ref >=60)

## 2022-10-08 NOTE — Progress Notes (Signed)
Sodium and potassium are low.  Hemoglobin is low and stable.  Platelets are elevated due to low hemoglobin.  Please forward results to her PCP.  Patient should take multivitamin with iron.

## 2022-10-12 ENCOUNTER — Other Ambulatory Visit: Payer: Self-pay | Admitting: Physician Assistant

## 2022-10-12 DIAGNOSIS — M0579 Rheumatoid arthritis with rheumatoid factor of multiple sites without organ or systems involvement: Secondary | ICD-10-CM

## 2022-10-14 NOTE — Telephone Encounter (Signed)
Last Fill: 05/13/2022  Labs: 10/07/2022 Sodium and potassium are low.  Hemoglobin is low and stable.  Platelets are elevated due to low hemoglobin. Patient should take multivitamin with iron.   TB Gold: 01/18/2022 negative    Next Visit: 10/15/2022  Last Visit: 05/13/2022  [id number] arthritis involving multiple sites with positive rheumatoid factor   Current Dose per office note on 05/13/2022: Rinvoq 15 mg 1 tablet by mouth daily   Okay to refill Rinvoq?

## 2022-10-15 ENCOUNTER — Ambulatory Visit: Payer: Medicare PPO | Admitting: Physician Assistant

## 2022-10-15 ENCOUNTER — Encounter: Payer: Self-pay | Admitting: Physician Assistant

## 2022-10-15 VITALS — BP 175/70 | HR 65 | Resp 13 | Ht 65.0 in | Wt 136.8 lb

## 2022-10-15 DIAGNOSIS — M0579 Rheumatoid arthritis with rheumatoid factor of multiple sites without organ or systems involvement: Secondary | ICD-10-CM | POA: Diagnosis not present

## 2022-10-15 DIAGNOSIS — M19042 Primary osteoarthritis, left hand: Secondary | ICD-10-CM

## 2022-10-15 DIAGNOSIS — M170 Bilateral primary osteoarthritis of knee: Secondary | ICD-10-CM

## 2022-10-15 DIAGNOSIS — Z8639 Personal history of other endocrine, nutritional and metabolic disease: Secondary | ICD-10-CM | POA: Diagnosis not present

## 2022-10-15 DIAGNOSIS — M810 Age-related osteoporosis without current pathological fracture: Secondary | ICD-10-CM

## 2022-10-15 DIAGNOSIS — Z79899 Other long term (current) drug therapy: Secondary | ICD-10-CM | POA: Diagnosis not present

## 2022-10-15 DIAGNOSIS — M19041 Primary osteoarthritis, right hand: Secondary | ICD-10-CM | POA: Diagnosis not present

## 2022-10-15 DIAGNOSIS — I10 Essential (primary) hypertension: Secondary | ICD-10-CM

## 2022-10-15 DIAGNOSIS — Z8719 Personal history of other diseases of the digestive system: Secondary | ICD-10-CM

## 2022-10-15 DIAGNOSIS — M7061 Trochanteric bursitis, right hip: Secondary | ICD-10-CM | POA: Diagnosis not present

## 2022-10-15 DIAGNOSIS — M5030 Other cervical disc degeneration, unspecified cervical region: Secondary | ICD-10-CM

## 2022-10-15 DIAGNOSIS — M5136 Other intervertebral disc degeneration, lumbar region: Secondary | ICD-10-CM | POA: Diagnosis not present

## 2022-10-15 DIAGNOSIS — I2510 Atherosclerotic heart disease of native coronary artery without angina pectoris: Secondary | ICD-10-CM

## 2022-10-15 DIAGNOSIS — E222 Syndrome of inappropriate secretion of antidiuretic hormone: Secondary | ICD-10-CM

## 2022-10-15 DIAGNOSIS — G629 Polyneuropathy, unspecified: Secondary | ICD-10-CM

## 2022-10-15 DIAGNOSIS — Z86010 Personal history of colonic polyps: Secondary | ICD-10-CM

## 2022-10-15 DIAGNOSIS — Z111 Encounter for screening for respiratory tuberculosis: Secondary | ICD-10-CM

## 2022-10-15 DIAGNOSIS — Z8669 Personal history of other diseases of the nervous system and sense organs: Secondary | ICD-10-CM

## 2022-10-15 DIAGNOSIS — Z8589 Personal history of malignant neoplasm of other organs and systems: Secondary | ICD-10-CM

## 2022-10-15 NOTE — Patient Instructions (Signed)
Standing Labs We placed an order today for your standing lab work.   Please have your standing labs drawn in August and every 3 months   Please have your labs drawn 2 weeks prior to your appointment so that the provider can discuss your lab results at your appointment, if possible.  Please note that you may see your imaging and lab results in MyChart before we have reviewed them. We will contact you once all results are reviewed. Please allow our office up to 72 hours to thoroughly review all of the results before contacting the office for clarification of your results.  WALK-IN LAB HOURS  Monday through Thursday from 8:00 am -12:30 pm and 1:00 pm-5:00 pm and Friday from 8:00 am-12:00 pm.  Patients with office visits requiring labs will be seen before walk-in labs.  You may encounter longer than normal wait times. Please allow additional time. Wait times may be shorter on  Monday and Thursday afternoons.  We do not book appointments for walk-in labs. We appreciate your patience and understanding with our staff.   Labs are drawn by [facility]. Please bring your co-pay at the time of your lab draw.  You may receive a bill from [facility] for your lab work.  Please note if you are on Hydroxychloroquine and and an order has been placed for a Hydroxychloroquine level,  you will need to have it drawn 4 hours or more after your last dose.  If you wish to have your labs drawn at another location, please call the office 24 hours in advance so we can fax the orders.  The office is located at [address], [address], [place], [place] [address]   If you have any questions regarding directions or hours of operation,  please call [phone].   As a reminder, please drink plenty of water prior to coming for your lab work. Thanks!  

## 2022-10-16 ENCOUNTER — Ambulatory Visit
Admission: RE | Admit: 2022-10-16 | Discharge: 2022-10-16 | Disposition: A | Discharge status: Home / Self Care | Payer: Medicare PPO | Source: Ambulatory Visit | Attending: Pain Medicine | Admitting: Pain Medicine

## 2022-10-16 ENCOUNTER — Other Ambulatory Visit: Payer: Self-pay | Admitting: Pain Medicine

## 2022-10-16 DIAGNOSIS — M542 Cervicalgia: Secondary | ICD-10-CM

## 2022-10-16 DIAGNOSIS — D6489 Other specified anemias: Secondary | ICD-10-CM | POA: Diagnosis not present

## 2022-10-16 DIAGNOSIS — Z6823 Body mass index (BMI) 23.0-23.9, adult: Secondary | ICD-10-CM | POA: Diagnosis not present

## 2022-10-16 DIAGNOSIS — H4010X0 Unspecified open-angle glaucoma, stage unspecified: Secondary | ICD-10-CM | POA: Diagnosis not present

## 2022-10-16 DIAGNOSIS — J019 Acute sinusitis, unspecified: Secondary | ICD-10-CM | POA: Diagnosis not present

## 2022-10-16 DIAGNOSIS — I10 Essential (primary) hypertension: Secondary | ICD-10-CM | POA: Diagnosis not present

## 2022-10-16 DIAGNOSIS — R519 Headache, unspecified: Secondary | ICD-10-CM | POA: Diagnosis not present

## 2022-10-30 ENCOUNTER — Ambulatory Visit
Admission: RE | Admit: 2022-10-30 | Discharge: 2022-10-30 | Disposition: A | Discharge status: Home / Self Care | Payer: Medicare PPO | Source: Ambulatory Visit | Attending: Family Medicine | Admitting: Family Medicine

## 2022-10-30 ENCOUNTER — Other Ambulatory Visit: Payer: Self-pay | Admitting: Family Medicine

## 2022-10-30 DIAGNOSIS — I6782 Cerebral ischemia: Secondary | ICD-10-CM | POA: Diagnosis not present

## 2022-10-30 DIAGNOSIS — R519 Headache, unspecified: Secondary | ICD-10-CM

## 2022-10-30 DIAGNOSIS — Z6823 Body mass index (BMI) 23.0-23.9, adult: Secondary | ICD-10-CM | POA: Diagnosis not present

## 2022-10-30 DIAGNOSIS — I25119 Atherosclerotic heart disease of native coronary artery with unspecified angina pectoris: Secondary | ICD-10-CM | POA: Diagnosis not present

## 2022-11-11 DIAGNOSIS — H401233 Low-tension glaucoma, bilateral, severe stage: Secondary | ICD-10-CM | POA: Diagnosis not present

## 2022-11-20 DIAGNOSIS — G4733 Obstructive sleep apnea (adult) (pediatric): Secondary | ICD-10-CM | POA: Diagnosis not present

## 2022-11-26 DIAGNOSIS — D0461 Carcinoma in situ of skin of right upper limb, including shoulder: Secondary | ICD-10-CM | POA: Diagnosis not present

## 2022-11-26 DIAGNOSIS — C44622 Squamous cell carcinoma of skin of right upper limb, including shoulder: Secondary | ICD-10-CM | POA: Diagnosis not present

## 2022-11-27 DIAGNOSIS — Z6823 Body mass index (BMI) 23.0-23.9, adult: Secondary | ICD-10-CM | POA: Diagnosis not present

## 2022-11-27 DIAGNOSIS — R519 Headache, unspecified: Secondary | ICD-10-CM | POA: Diagnosis not present

## 2022-11-27 DIAGNOSIS — I25119 Atherosclerotic heart disease of native coronary artery with unspecified angina pectoris: Secondary | ICD-10-CM | POA: Diagnosis not present

## 2022-11-27 DIAGNOSIS — R413 Other amnesia: Secondary | ICD-10-CM | POA: Diagnosis not present

## 2022-11-29 ENCOUNTER — Encounter: Payer: Self-pay | Admitting: Neurology
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
## 2022-12-04 ENCOUNTER — Ambulatory Visit: Payer: Medicare PPO | Admitting: Family Medicine

## 2022-12-04 VITALS — BP 172/76 | HR 66 | Ht 65.0 in | Wt 137.0 lb

## 2022-12-04 DIAGNOSIS — F0781 Postconcussional syndrome: Secondary | ICD-10-CM

## 2022-12-04 DIAGNOSIS — G43119 Migraine with aura, intractable, without status migrainosus: Secondary | ICD-10-CM

## 2022-12-04 DIAGNOSIS — H539 Unspecified visual disturbance: Secondary | ICD-10-CM

## 2022-12-04 MED ORDER — TOPIRAMATE 25 MG PO TABS: 25.0000 mg | ORAL_TABLET | Freq: Two times a day (BID) | ORAL | 1 refills | Status: DC

## 2022-12-04 NOTE — Patient Instructions (Addendum)
Thank you for coming in today.   Plan for PT.   Plan for Neuro-optometry.   Take topomax twice daily to prevent headaches.   Recheck in 2 weeks.   Please keep you appointment with Dr Loleta Chance.

## 2022-12-07 ENCOUNTER — Other Ambulatory Visit: Payer: Self-pay | Admitting: Physician Assistant

## 2022-12-07 ENCOUNTER — Other Ambulatory Visit: Payer: Self-pay | Admitting: Internal Medicine

## 2022-12-09 NOTE — Telephone Encounter (Signed)
I spoke with Felicia [name] to ask if she was still on her generic nexium. It got crossed off her list by accident at her Dr appointment last week. She is and does need a refill. I booked her an Oct appointment and will refill her rx.

## 2022-12-09 NOTE — Telephone Encounter (Signed)
Last Fill: 08/28/2022  Labs: 10/07/2022 Sodium and potassium are low.  Hemoglobin is low and stable.  Platelets are elevated due to low hemoglobin.  Please forward results to her PCP.  Patient should take multivitamin with iron.   Next Visit: 04/02/2023  Last Visit: 10/15/2022  DX: Rheumatoid arthritis involving multiple sites with positive rheumatoid factor   Current Dose per office note on 10/15/2022: Methotrexate 4 tablets by mouth every 7 days   Okay to refill Methotrexate?
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
## 2022-12-18 ENCOUNTER — Ambulatory Visit: Payer: Medicare PPO | Admitting: Family Medicine

## 2022-12-18 VITALS — BP 146/72 | HR 58 | Ht 65.0 in | Wt 138.0 lb

## 2022-12-18 DIAGNOSIS — H539 Unspecified visual disturbance: Secondary | ICD-10-CM | POA: Diagnosis not present

## 2022-12-18 DIAGNOSIS — F0781 Postconcussional syndrome: Secondary | ICD-10-CM

## 2022-12-18 DIAGNOSIS — R4189 Other symptoms and signs involving cognitive functions and awareness: Secondary | ICD-10-CM | POA: Diagnosis not present

## 2022-12-18 DIAGNOSIS — H9313 Tinnitus, bilateral: Secondary | ICD-10-CM | POA: Diagnosis not present

## 2022-12-18 DIAGNOSIS — G43119 Migraine with aura, intractable, without status migrainosus: Secondary | ICD-10-CM

## 2022-12-18 MED ORDER — TOPIRAMATE ER 100 MG PO CAP24: 100.0000 mg | ORAL_CAPSULE | Freq: Every day | ORAL | 1 refills | Status: DC

## 2022-12-18 NOTE — Patient Instructions (Addendum)
Thank you for coming in today.   STOP topamax twice daily.   START trokendi once daily.   Recheck with me in about 2 weeks. Make sure your visit with me is after your visit with Dr Loleta Chance on the 26th   Consider a white noise machine for Tinnitus.   [facility] [place]) [address] [place], [place]  [address] Phone:  [phone] Fax:      [phone] Toll Free:  [phone]
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
## 2022-12-19 ENCOUNTER — Other Ambulatory Visit: Payer: Self-pay

## 2022-12-19 ENCOUNTER — Ambulatory Visit: Payer: Medicare PPO | Attending: Family Medicine

## 2022-12-19 DIAGNOSIS — R42 Dizziness and giddiness: Secondary | ICD-10-CM | POA: Insufficient documentation

## 2022-12-19 DIAGNOSIS — M542 Cervicalgia: Secondary | ICD-10-CM | POA: Diagnosis not present

## 2022-12-19 DIAGNOSIS — F0781 Postconcussional syndrome: Secondary | ICD-10-CM | POA: Diagnosis not present

## 2022-12-19 DIAGNOSIS — R2681 Unsteadiness on feet: Secondary | ICD-10-CM | POA: Diagnosis not present
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
## 2022-12-23 ENCOUNTER — Other Ambulatory Visit: Payer: Self-pay | Admitting: *Deleted

## 2022-12-23 DIAGNOSIS — Z6823 Body mass index (BMI) 23.0-23.9, adult: Secondary | ICD-10-CM | POA: Diagnosis not present

## 2022-12-23 DIAGNOSIS — Z111 Encounter for screening for respiratory tuberculosis: Secondary | ICD-10-CM

## 2022-12-23 DIAGNOSIS — G44329 Chronic post-traumatic headache, not intractable: Secondary | ICD-10-CM | POA: Diagnosis not present

## 2022-12-23 DIAGNOSIS — Z79899 Other long term (current) drug therapy: Secondary | ICD-10-CM

## 2022-12-23 DIAGNOSIS — F0781 Postconcussional syndrome: Secondary | ICD-10-CM | POA: Diagnosis not present

## 2022-12-23 DIAGNOSIS — H539 Unspecified visual disturbance: Secondary | ICD-10-CM | POA: Diagnosis not present

## 2022-12-23 LAB — CBC WITH DIFFERENTIAL/PLATELET
HCT: 31.9 % — ABNORMAL LOW (ref 35.0–45.0)
MCHC: 33.2 g/dL (ref 32.0–36.0)
MPV: 9.6 fL (ref 7.5–12.5)
Platelets: 468 10*3/uL — ABNORMAL HIGH (ref 140–400)
RDW: 13.1 % (ref 11.0–15.0)
Total Lymphocyte: 29 %
WBC: 6 10*3/uL (ref 3.8–10.8)

## 2022-12-24 LAB — CBC WITH DIFFERENTIAL/PLATELET
Basophils Absolute: 42 cells/uL (ref 0–200)
Eosinophils Absolute: 48 cells/uL (ref 15–500)
Eosinophils Relative: 0.8 %
Lymphs Abs: 1740 cells/uL (ref 850–3900)
MCH: 32.6 pg (ref 27.0–33.0)
MCV: 98.2 fL (ref 80.0–100.0)

## 2022-12-24 LAB — COMPLETE METABOLIC PANEL WITH GFR
ALT: 16 U/L (ref 6–29)
Albumin: 4.2 g/dL (ref 3.6–5.1)
Alkaline phosphatase (APISO): 38 U/L (ref 37–153)
Calcium: 10.2 mg/dL (ref 8.6–10.4)
Sodium: 134 mmol/L — ABNORMAL LOW (ref 135–146)

## 2022-12-25 ENCOUNTER — Ambulatory Visit: Payer: Medicare PPO

## 2022-12-25 DIAGNOSIS — R2681 Unsteadiness on feet: Secondary | ICD-10-CM | POA: Diagnosis not present

## 2022-12-25 DIAGNOSIS — M542 Cervicalgia: Secondary | ICD-10-CM | POA: Diagnosis not present

## 2022-12-25 DIAGNOSIS — F0781 Postconcussional syndrome: Secondary | ICD-10-CM | POA: Diagnosis not present

## 2022-12-25 DIAGNOSIS — R42 Dizziness and giddiness: Secondary | ICD-10-CM | POA: Diagnosis not present

## 2022-12-25 LAB — CBC WITH DIFFERENTIAL/PLATELET
Absolute Monocytes: 558 cells/uL (ref 200–950)
Basophils Relative: 0.7 %
Hemoglobin: 10.6 g/dL — ABNORMAL LOW (ref 11.7–15.5)
Monocytes Relative: 9.3 %
Neutro Abs: 3612 cells/uL (ref 1500–7800)
Neutrophils Relative %: 60.2 %
RBC: 3.25 10*6/uL — ABNORMAL LOW (ref 3.80–5.10)

## 2022-12-25 LAB — COMPLETE METABOLIC PANEL WITH GFR
AG Ratio: 1.9 (calc) (ref 1.0–2.5)
AST: 22 U/L (ref 10–35)
BUN/Creatinine Ratio: 27 (calc) — ABNORMAL HIGH (ref 6–22)
BUN: 27 mg/dL — ABNORMAL HIGH (ref 7–25)
CO2: 21 mmol/L (ref 20–32)
Chloride: 106 mmol/L (ref 98–110)
Creat: 0.99 mg/dL — ABNORMAL HIGH (ref 0.60–0.95)
Globulin: 2.2 g/dL (calc) (ref 1.9–3.7)
Glucose, Bld: 84 mg/dL (ref 65–99)
Potassium: 5 mmol/L (ref 3.5–5.3)
Total Bilirubin: 0.4 mg/dL (ref 0.2–1.2)
Total Protein: 6.4 g/dL (ref 6.1–8.1)
eGFR: 57 mL/min/{1.73_m2} — ABNORMAL LOW (ref >=60)

## 2022-12-25 LAB — QUANTIFERON-TB GOLD PLUS
Mitogen-NIL: 0.58 IU/mL
NIL: 0.02 IU/mL
QuantiFERON-TB Gold Plus: NEGATIVE
TB1-NIL: 0 IU/mL
TB2-NIL: 0 IU/mL
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
## 2022-12-26 NOTE — Progress Notes (Signed)
Hemoglobin is low and is stable.  Creatinine is mildly elevated and stable.  Sodium is low and stable.  Please forward results to her PCP.

## 2022-12-27 ENCOUNTER — Encounter: Payer: Self-pay | Admitting: Neurology

## 2022-12-27 ENCOUNTER — Other Ambulatory Visit (INDEPENDENT_AMBULATORY_CARE_PROVIDER_SITE_OTHER): Payer: Medicare PPO

## 2022-12-27 ENCOUNTER — Ambulatory Visit: Payer: Medicare PPO | Admitting: Neurology

## 2022-12-27 VITALS — BP 136/71 | HR 62 | Ht 65.0 in | Wt 135.0 lb

## 2022-12-27 DIAGNOSIS — G4489 Other headache syndrome: Secondary | ICD-10-CM

## 2022-12-27 DIAGNOSIS — R413 Other amnesia: Secondary | ICD-10-CM

## 2022-12-27 DIAGNOSIS — G629 Polyneuropathy, unspecified: Secondary | ICD-10-CM

## 2022-12-27 DIAGNOSIS — R4189 Other symptoms and signs involving cognitive functions and awareness: Secondary | ICD-10-CM

## 2022-12-27 DIAGNOSIS — F0781 Postconcussional syndrome: Secondary | ICD-10-CM | POA: Diagnosis not present

## 2022-12-27 LAB — TSH: TSH: 1.75 u[IU]/mL (ref 0.35–5.50)

## 2022-12-27 LAB — VITAMIN B12: Vitamin B-12: >1501 pg/mL — ABNORMAL HIGH (ref 211–911)

## 2022-12-27 MED ORDER — NORTRIPTYLINE HCL 10 MG PO CAPS: 20.0000 mg | ORAL_CAPSULE | Freq: Every day | ORAL | 3 refills | Status: DC

## 2022-12-27 NOTE — Patient Instructions (Signed)
I saw you today for memory problems and post-concussive headaches/syndrome.  It is hard to know if you have an underlying memory problem because the concussive symptoms and pain may be masking some of this.  I would like to help by doing the following: -Blood work today -MRI brain -Start Nortriptyline. You will take 10 mg at bedtime for 1 week, then increase to 20 mg at bedtime thereafter. This can cause you to be sleepy or have a dry mouth. -You can take topamax for 1 week with the Nortriptyline, but then stop. It could be contributing to your memory and thinking problems.  Continue therapy.  I will be in touch when I have your results and will see you back in 3 months to see how you are doing.  Please let me know if you have any questions or concerns in the meantime.  The physicians and staff at [facility] are committed to providing excellent care. You may receive a survey requesting feedback about your experience at our office. We strive to receive "very good" responses to the survey questions. If you feel that your experience would prevent you from giving the office a "very good " response, please contact our office to try to remedy the situation. We may be reached at [phone]. Thank you for taking the time out of your busy day to complete the survey.  Jacquelyne Balint, MD [facility]

## 2022-12-28 ENCOUNTER — Other Ambulatory Visit: Payer: Self-pay | Admitting: Physician Assistant

## 2022-12-30 ENCOUNTER — Ambulatory Visit: Payer: Medicare PPO | Admitting: Physical Therapy

## 2022-12-30 ENCOUNTER — Encounter: Payer: Self-pay | Admitting: Physical Therapy

## 2022-12-30 ENCOUNTER — Telehealth: Payer: Self-pay | Admitting: Physical Therapy

## 2022-12-30 DIAGNOSIS — R42 Dizziness and giddiness: Secondary | ICD-10-CM

## 2022-12-30 DIAGNOSIS — M542 Cervicalgia: Secondary | ICD-10-CM | POA: Diagnosis not present

## 2022-12-30 DIAGNOSIS — R2681 Unsteadiness on feet: Secondary | ICD-10-CM

## 2022-12-30 DIAGNOSIS — F0781 Postconcussional syndrome: Secondary | ICD-10-CM | POA: Diagnosis not present

## 2022-12-30 NOTE — Patient Instructions (Signed)

## 2022-12-30 NOTE — Telephone Encounter (Signed)
Dr. Denyse Amass,  Felicia [name] is being seen in OPPT s/p concussion.  The patient would benefit from ST evaluation for word finding and thought organization problems.    If you agree, please place an order in [email or web address] in [facility] or fax the order to [phone].  Thank you,   Anette Guarneri, PT, DPT 12/30/22 5:19 PM  [facility] at [facility] [address] [place], [address] [place], [place] [address] Phone # [phone] Fax # [phone]

## 2022-12-31 ENCOUNTER — Other Ambulatory Visit: Payer: Self-pay | Admitting: Rheumatology

## 2022-12-31 DIAGNOSIS — M0579 Rheumatoid arthritis with rheumatoid factor of multiple sites without organ or systems involvement: Secondary | ICD-10-CM

## 2022-12-31 NOTE — Telephone Encounter (Signed)
Last Fill: 10/14/2022  Labs: 12/23/2022 Hemoglobin is low and is stable.  Creatinine is mildly elevated and stable.  Sodium is low and stable.   TB Gold: 12/23/2022 Neg    Next Visit: 04/02/2023  Last Visit: 10/15/2022  [id number] arthritis involving multiple sites with positive rheumatoid factor   Current Dose per office note 10/15/2022: Rinvoq 15 mg 1 tablet by mouth daily   Okay to refill Rinvoq?

## 2023-01-01 ENCOUNTER — Ambulatory Visit: Payer: Medicare PPO | Admitting: Family Medicine

## 2023-01-01 ENCOUNTER — Ambulatory Visit: Payer: Medicare PPO | Admitting: Physical Therapy

## 2023-01-01 ENCOUNTER — Encounter: Payer: Self-pay | Admitting: Family Medicine

## 2023-01-01 ENCOUNTER — Encounter: Payer: Self-pay | Admitting: Physical Therapy

## 2023-01-01 VITALS — BP 130/78 | HR 65 | Ht 65.0 in | Wt 135.6 lb

## 2023-01-01 DIAGNOSIS — M542 Cervicalgia: Secondary | ICD-10-CM | POA: Diagnosis not present

## 2023-01-01 DIAGNOSIS — R2681 Unsteadiness on feet: Secondary | ICD-10-CM

## 2023-01-01 DIAGNOSIS — R42 Dizziness and giddiness: Secondary | ICD-10-CM | POA: Diagnosis not present

## 2023-01-01 DIAGNOSIS — F0781 Postconcussional syndrome: Secondary | ICD-10-CM

## 2023-01-01 DIAGNOSIS — R4189 Other symptoms and signs involving cognitive functions and awareness: Secondary | ICD-10-CM

## 2023-01-01 NOTE — Patient Instructions (Signed)
Thank you for coming in today.   Plan for speech therapy.   If the nortriptyline is not working enough let me know and we can add the fancy migraine medicine.   I think dry needling is a good idea.   Recheck in about 2-3 weeks.
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
## 2023-01-02 ENCOUNTER — Other Ambulatory Visit: Payer: Self-pay | Admitting: Neurology

## 2023-01-02 ENCOUNTER — Telehealth: Payer: Self-pay | Admitting: Neurology

## 2023-01-02 DIAGNOSIS — F419 Anxiety disorder, unspecified: Secondary | ICD-10-CM

## 2023-01-02 DIAGNOSIS — F40240 Claustrophobia: Secondary | ICD-10-CM

## 2023-01-02 MED ORDER — LORAZEPAM 1 MG PO TABS
ORAL_TABLET | ORAL | 0 refills | Status: DC
Start: 2023-01-02 — End: 2023-04-03
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
## 2023-01-02 NOTE — Telephone Encounter (Signed)
Pt is calling in stating that she really appreciate what Dr. Loleta Chance did for on 12/27/2022. She wanted to let us know that she has her MRI scheduled for Augusts 18, 2024 at 11:45 and is claustrophobic and would like to see if she can get some medication for her appointment to have the MRI done. Pharm:  [facility] [address]

## 2023-01-06 ENCOUNTER — Ambulatory Visit: Payer: Medicare PPO | Attending: Family Medicine

## 2023-01-06 DIAGNOSIS — R2681 Unsteadiness on feet: Secondary | ICD-10-CM | POA: Diagnosis not present

## 2023-01-06 DIAGNOSIS — R262 Difficulty in walking, not elsewhere classified: Secondary | ICD-10-CM | POA: Insufficient documentation

## 2023-01-06 DIAGNOSIS — R4701 Aphasia: Secondary | ICD-10-CM | POA: Insufficient documentation

## 2023-01-06 DIAGNOSIS — R42 Dizziness and giddiness: Secondary | ICD-10-CM | POA: Diagnosis not present

## 2023-01-06 DIAGNOSIS — M542 Cervicalgia: Secondary | ICD-10-CM | POA: Diagnosis not present

## 2023-01-06 DIAGNOSIS — R41841 Cognitive communication deficit: Secondary | ICD-10-CM | POA: Diagnosis not present
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
## 2023-01-08 ENCOUNTER — Ambulatory Visit: Payer: Medicare PPO | Admitting: Physical Therapy

## 2023-01-08 ENCOUNTER — Encounter: Payer: Self-pay | Admitting: Physical Therapy

## 2023-01-08 DIAGNOSIS — M542 Cervicalgia: Secondary | ICD-10-CM | POA: Diagnosis not present

## 2023-01-08 DIAGNOSIS — R42 Dizziness and giddiness: Secondary | ICD-10-CM

## 2023-01-08 DIAGNOSIS — R2681 Unsteadiness on feet: Secondary | ICD-10-CM

## 2023-01-08 DIAGNOSIS — R4701 Aphasia: Secondary | ICD-10-CM | POA: Diagnosis not present

## 2023-01-08 DIAGNOSIS — R41841 Cognitive communication deficit: Secondary | ICD-10-CM | POA: Diagnosis not present

## 2023-01-08 DIAGNOSIS — R262 Difficulty in walking, not elsewhere classified: Secondary | ICD-10-CM | POA: Diagnosis not present

## 2023-01-13 ENCOUNTER — Other Ambulatory Visit: Payer: Self-pay | Admitting: Neurology

## 2023-01-13 ENCOUNTER — Ambulatory Visit: Payer: Medicare PPO

## 2023-01-13 DIAGNOSIS — R42 Dizziness and giddiness: Secondary | ICD-10-CM | POA: Diagnosis not present

## 2023-01-13 DIAGNOSIS — R262 Difficulty in walking, not elsewhere classified: Secondary | ICD-10-CM | POA: Diagnosis not present

## 2023-01-13 DIAGNOSIS — R2681 Unsteadiness on feet: Secondary | ICD-10-CM | POA: Diagnosis not present

## 2023-01-13 DIAGNOSIS — R41841 Cognitive communication deficit: Secondary | ICD-10-CM | POA: Diagnosis not present

## 2023-01-13 DIAGNOSIS — R4701 Aphasia: Secondary | ICD-10-CM | POA: Diagnosis not present

## 2023-01-13 DIAGNOSIS — M542 Cervicalgia: Secondary | ICD-10-CM
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
## 2023-01-15 ENCOUNTER — Ambulatory Visit: Payer: Medicare PPO

## 2023-01-15 DIAGNOSIS — M542 Cervicalgia: Secondary | ICD-10-CM | POA: Diagnosis not present

## 2023-01-15 DIAGNOSIS — R2681 Unsteadiness on feet: Secondary | ICD-10-CM | POA: Diagnosis not present

## 2023-01-15 DIAGNOSIS — R42 Dizziness and giddiness: Secondary | ICD-10-CM | POA: Diagnosis not present

## 2023-01-15 DIAGNOSIS — R262 Difficulty in walking, not elsewhere classified: Secondary | ICD-10-CM

## 2023-01-15 DIAGNOSIS — R41841 Cognitive communication deficit: Secondary | ICD-10-CM | POA: Diagnosis not present

## 2023-01-15 DIAGNOSIS — R4701 Aphasia: Secondary | ICD-10-CM | POA: Diagnosis not present
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
## 2023-01-19 ENCOUNTER — Ambulatory Visit
Admission: RE | Admit: 2023-01-19 | Discharge: 2023-01-19 | Disposition: A | Discharge status: Home / Self Care | Payer: Medicare PPO | Source: Ambulatory Visit | Attending: Neurology | Admitting: Neurology

## 2023-01-19 DIAGNOSIS — R634 Abnormal weight loss: Secondary | ICD-10-CM | POA: Diagnosis not present

## 2023-01-19 DIAGNOSIS — G4489 Other headache syndrome: Secondary | ICD-10-CM

## 2023-01-19 MED ORDER — GADOPICLENOL 0.5 MMOL/ML IV SOLN
6.0000 mL | Freq: Once | INTRAVENOUS | Status: AC | PRN
  Administered 2023-01-19: 6 mL via INTRAVENOUS

## 2023-01-22 ENCOUNTER — Encounter: Payer: Self-pay | Admitting: Family Medicine

## 2023-01-22 ENCOUNTER — Ambulatory Visit: Payer: Medicare PPO | Admitting: Family Medicine

## 2023-01-22 ENCOUNTER — Ambulatory Visit: Payer: Medicare PPO

## 2023-01-22 VITALS — BP 132/84 | HR 76 | Ht 65.0 in | Wt 146.0 lb

## 2023-01-22 DIAGNOSIS — M542 Cervicalgia: Secondary | ICD-10-CM | POA: Diagnosis not present

## 2023-01-22 DIAGNOSIS — F0781 Postconcussional syndrome: Secondary | ICD-10-CM | POA: Diagnosis not present

## 2023-01-22 DIAGNOSIS — H9313 Tinnitus, bilateral: Secondary | ICD-10-CM

## 2023-01-22 DIAGNOSIS — R2681 Unsteadiness on feet: Secondary | ICD-10-CM

## 2023-01-22 DIAGNOSIS — R262 Difficulty in walking, not elsewhere classified: Secondary | ICD-10-CM | POA: Diagnosis not present

## 2023-01-22 DIAGNOSIS — R4701 Aphasia: Secondary | ICD-10-CM | POA: Diagnosis not present

## 2023-01-22 DIAGNOSIS — R42 Dizziness and giddiness: Secondary | ICD-10-CM | POA: Diagnosis not present

## 2023-01-22 DIAGNOSIS — R5383 Other fatigue: Secondary | ICD-10-CM

## 2023-01-22 DIAGNOSIS — R4189 Other symptoms and signs involving cognitive functions and awareness: Secondary | ICD-10-CM | POA: Diagnosis not present

## 2023-01-22 DIAGNOSIS — R41841 Cognitive communication deficit: Secondary | ICD-10-CM | POA: Diagnosis not present
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
## 2023-01-22 NOTE — Patient Instructions (Addendum)
Thank you for coming in today.   Continue PT and Speech Therapy for cognitive rehab.   Recheck in 6 weeks.   Let me know sooner if this is not working.

## 2023-01-29 ENCOUNTER — Ambulatory Visit: Payer: Medicare PPO

## 2023-01-29 ENCOUNTER — Other Ambulatory Visit: Payer: Self-pay

## 2023-01-29 DIAGNOSIS — R262 Difficulty in walking, not elsewhere classified: Secondary | ICD-10-CM

## 2023-01-29 DIAGNOSIS — M542 Cervicalgia: Secondary | ICD-10-CM

## 2023-01-29 DIAGNOSIS — R2681 Unsteadiness on feet: Secondary | ICD-10-CM | POA: Diagnosis not present

## 2023-01-29 DIAGNOSIS — R41841 Cognitive communication deficit: Secondary | ICD-10-CM | POA: Diagnosis not present

## 2023-01-29 DIAGNOSIS — R42 Dizziness and giddiness: Secondary | ICD-10-CM | POA: Diagnosis not present

## 2023-01-29 DIAGNOSIS — R4701 Aphasia: Secondary | ICD-10-CM

## 2023-01-29 NOTE — Patient Instructions (Signed)
    [facility] [employer] - app on the App Store - language and cognitive tasks on your iPhone or iPad  If you prefer, you could go on your computer and go to: [email or web address] For some language and cognitive tasks to help you. These are on the computer and not an app.

## 2023-01-29 NOTE — Addendum Note (Signed)
Addended by: Dion Body on: 01/29/2023 01:01 PM   Modules accepted: Orders
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
## 2023-01-31 ENCOUNTER — Other Ambulatory Visit: Payer: Self-pay | Admitting: Physician Assistant

## 2023-01-31 NOTE — Telephone Encounter (Signed)
Last Fill: 02/19/2022  Next Visit: 04/02/2023  Last Visit: 10/15/2022  Dx: Rheumatoid arthritis involving multiple sites with positive rheumatoid factor   Current Dose per office note on 10/15/2022:  folic acid 1 mg 2 tablets daily.   Okay to refill Folic Acid?

## 2023-02-04 ENCOUNTER — Ambulatory Visit: Payer: Medicare PPO | Attending: Family Medicine

## 2023-02-04 ENCOUNTER — Encounter: Payer: Self-pay | Admitting: Neurology

## 2023-02-04 DIAGNOSIS — R2681 Unsteadiness on feet: Secondary | ICD-10-CM | POA: Diagnosis not present

## 2023-02-04 DIAGNOSIS — R4701 Aphasia: Secondary | ICD-10-CM | POA: Insufficient documentation

## 2023-02-04 DIAGNOSIS — R42 Dizziness and giddiness: Secondary | ICD-10-CM | POA: Insufficient documentation

## 2023-02-04 DIAGNOSIS — M542 Cervicalgia: Secondary | ICD-10-CM | POA: Insufficient documentation

## 2023-02-04 DIAGNOSIS — R41841 Cognitive communication deficit: Secondary | ICD-10-CM | POA: Insufficient documentation
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
## 2023-02-12 ENCOUNTER — Ambulatory Visit: Payer: Medicare PPO

## 2023-02-12 ENCOUNTER — Encounter: Payer: Self-pay | Admitting: Physical Therapy

## 2023-02-12 ENCOUNTER — Ambulatory Visit: Payer: Medicare PPO | Admitting: Physical Therapy

## 2023-02-12 DIAGNOSIS — M542 Cervicalgia: Secondary | ICD-10-CM | POA: Diagnosis not present

## 2023-02-12 DIAGNOSIS — R41841 Cognitive communication deficit: Secondary | ICD-10-CM

## 2023-02-12 DIAGNOSIS — R4701 Aphasia: Secondary | ICD-10-CM | POA: Diagnosis not present

## 2023-02-12 DIAGNOSIS — R2681 Unsteadiness on feet: Secondary | ICD-10-CM

## 2023-02-12 DIAGNOSIS — R42 Dizziness and giddiness: Secondary | ICD-10-CM | POA: Diagnosis not present
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
## 2023-02-12 NOTE — Patient Instructions (Addendum)
    Please fill out the two questionnaires and return on Friday.  The 15% off code for [facility] is [id number]   If you prefer, you could go on your computer and go to: [email or web address]  to complete some language and cognitive tasks to help you.

## 2023-02-14 ENCOUNTER — Ambulatory Visit: Payer: Medicare PPO

## 2023-02-14 DIAGNOSIS — R41841 Cognitive communication deficit: Secondary | ICD-10-CM

## 2023-02-14 DIAGNOSIS — M542 Cervicalgia: Secondary | ICD-10-CM | POA: Diagnosis not present

## 2023-02-14 DIAGNOSIS — R42 Dizziness and giddiness: Secondary | ICD-10-CM | POA: Diagnosis not present

## 2023-02-14 DIAGNOSIS — R2681 Unsteadiness on feet: Secondary | ICD-10-CM | POA: Diagnosis not present

## 2023-02-14 DIAGNOSIS — R4701 Aphasia: Secondary | ICD-10-CM | POA: Diagnosis not present
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
## 2023-02-17 ENCOUNTER — Other Ambulatory Visit: Payer: Self-pay

## 2023-02-17 MED ORDER — GABAPENTIN 100 MG PO CAPS: 300.0000 mg | ORAL_CAPSULE | Freq: Every day | ORAL | 3 refills | Status: DC

## 2023-02-18 ENCOUNTER — Ambulatory Visit: Payer: Medicare PPO

## 2023-02-18 ENCOUNTER — Ambulatory Visit: Payer: Medicare PPO | Admitting: Physical Therapy

## 2023-02-18 ENCOUNTER — Encounter: Payer: Self-pay | Admitting: Physical Therapy

## 2023-02-18 DIAGNOSIS — R41841 Cognitive communication deficit: Secondary | ICD-10-CM | POA: Diagnosis not present

## 2023-02-18 DIAGNOSIS — R2681 Unsteadiness on feet: Secondary | ICD-10-CM | POA: Diagnosis not present

## 2023-02-18 DIAGNOSIS — R4701 Aphasia: Secondary | ICD-10-CM | POA: Diagnosis not present

## 2023-02-18 DIAGNOSIS — G4733 Obstructive sleep apnea (adult) (pediatric): Secondary | ICD-10-CM | POA: Diagnosis not present

## 2023-02-18 DIAGNOSIS — M542 Cervicalgia: Secondary | ICD-10-CM | POA: Diagnosis not present

## 2023-02-18 DIAGNOSIS — R42 Dizziness and giddiness: Secondary | ICD-10-CM | POA: Diagnosis not present
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
## 2023-02-20 ENCOUNTER — Ambulatory Visit: Payer: Medicare PPO

## 2023-02-21 ENCOUNTER — Ambulatory Visit: Payer: Medicare PPO

## 2023-02-21 DIAGNOSIS — R41841 Cognitive communication deficit: Secondary | ICD-10-CM | POA: Diagnosis not present

## 2023-02-21 DIAGNOSIS — R4701 Aphasia: Secondary | ICD-10-CM | POA: Diagnosis not present

## 2023-02-21 DIAGNOSIS — R42 Dizziness and giddiness: Secondary | ICD-10-CM | POA: Diagnosis not present

## 2023-02-21 DIAGNOSIS — M542 Cervicalgia: Secondary | ICD-10-CM | POA: Diagnosis not present

## 2023-02-21 DIAGNOSIS — R2681 Unsteadiness on feet: Secondary | ICD-10-CM | POA: Diagnosis not present
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
## 2023-02-25 ENCOUNTER — Ambulatory Visit: Payer: Medicare PPO

## 2023-02-25 ENCOUNTER — Ambulatory Visit: Payer: Medicare PPO | Admitting: Physical Therapy

## 2023-02-25 ENCOUNTER — Encounter: Payer: Self-pay | Admitting: Physical Therapy

## 2023-02-25 DIAGNOSIS — R42 Dizziness and giddiness: Secondary | ICD-10-CM | POA: Diagnosis not present

## 2023-02-25 DIAGNOSIS — R4701 Aphasia: Secondary | ICD-10-CM | POA: Diagnosis not present

## 2023-02-25 DIAGNOSIS — R2681 Unsteadiness on feet: Secondary | ICD-10-CM

## 2023-02-25 DIAGNOSIS — M542 Cervicalgia: Secondary | ICD-10-CM | POA: Diagnosis not present

## 2023-02-25 DIAGNOSIS — R41841 Cognitive communication deficit: Secondary | ICD-10-CM
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
## 2023-02-27 ENCOUNTER — Other Ambulatory Visit: Payer: Self-pay | Admitting: Physician Assistant

## 2023-02-27 NOTE — Telephone Encounter (Signed)
Last Fill: 12/09/2022  Labs: 12/23/2022 Hemoglobin is low and is stable.  Creatinine is mildly elevated and stable.  Sodium is low and stable.   Next Visit: 04/02/2023  Last Visit: 10/15/2022  DX: Rheumatoid arthritis involving multiple sites with positive rheumatoid factor   Current Dose per office note 10/15/2022: Methotrexate 4 tablets by mouth every 7 days   Okay to refill Methotrexate?

## 2023-02-28 ENCOUNTER — Ambulatory Visit: Payer: Medicare PPO

## 2023-03-03 DIAGNOSIS — H401233 Low-tension glaucoma, bilateral, severe stage: Secondary | ICD-10-CM | POA: Diagnosis not present

## 2023-03-04 ENCOUNTER — Ambulatory Visit: Payer: Medicare PPO | Attending: Family Medicine

## 2023-03-04 ENCOUNTER — Other Ambulatory Visit: Payer: Self-pay | Admitting: Internal Medicine

## 2023-03-04 ENCOUNTER — Ambulatory Visit: Payer: Medicare PPO

## 2023-03-04 DIAGNOSIS — R4701 Aphasia: Secondary | ICD-10-CM | POA: Diagnosis not present

## 2023-03-04 DIAGNOSIS — R41841 Cognitive communication deficit: Secondary | ICD-10-CM | POA: Diagnosis not present
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
## 2023-03-05 ENCOUNTER — Encounter: Payer: Self-pay | Admitting: Family Medicine

## 2023-03-05 ENCOUNTER — Ambulatory Visit: Payer: Medicare PPO | Admitting: Family Medicine

## 2023-03-05 VITALS — BP 144/82 | HR 72 | Ht 65.0 in | Wt 138.0 lb

## 2023-03-05 DIAGNOSIS — G43119 Migraine with aura, intractable, without status migrainosus: Secondary | ICD-10-CM | POA: Diagnosis not present

## 2023-03-05 DIAGNOSIS — F0781 Postconcussional syndrome: Secondary | ICD-10-CM

## 2023-03-05 DIAGNOSIS — H539 Unspecified visual disturbance: Secondary | ICD-10-CM

## 2023-03-05 NOTE — Patient Instructions (Signed)
Thank you for coming in today.   Lets try stopping the nortriptyline. Take just 1 at night for a few nights then stop.  If you figure out that you really needed it we could restart or go back to  Topimerate and use an extended release form with fewer side effects.   Recheck in about 3 months.   Let me know sooner if this is not working.
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
## 2023-03-07 ENCOUNTER — Other Ambulatory Visit: Payer: Self-pay

## 2023-03-07 ENCOUNTER — Ambulatory Visit: Payer: Medicare PPO

## 2023-03-07 MED ORDER — NEXLIZET 180-10 MG PO TABS: 1.0000 | ORAL_TABLET | Freq: Every day | ORAL | 0 refills | Status: DC

## 2023-03-11 ENCOUNTER — Ambulatory Visit: Payer: Medicare PPO

## 2023-03-12 ENCOUNTER — Encounter: Payer: Self-pay | Admitting: Internal Medicine

## 2023-03-12 ENCOUNTER — Ambulatory Visit: Payer: Medicare PPO | Admitting: Internal Medicine

## 2023-03-12 VITALS — BP 134/80 | HR 72 | Ht 63.5 in | Wt 137.1 lb

## 2023-03-12 DIAGNOSIS — K219 Gastro-esophageal reflux disease without esophagitis: Secondary | ICD-10-CM | POA: Diagnosis not present

## 2023-03-12 DIAGNOSIS — K581 Irritable bowel syndrome with constipation: Secondary | ICD-10-CM | POA: Diagnosis not present

## 2023-03-12 DIAGNOSIS — R140 Abdominal distension (gaseous): Secondary | ICD-10-CM

## 2023-03-12 MED ORDER — ESOMEPRAZOLE MAGNESIUM 40 MG PO CPDR: DELAYED_RELEASE_CAPSULE | ORAL | 0 refills | Status: DC

## 2023-03-12 NOTE — Patient Instructions (Signed)
VISIT SUMMARY:  During your visit, we discussed your concerns about abdominal bloating, increased flatulence, and  GERD.  YOUR PLAN:  -ABDOMINAL BLOATING: You have been experiencing persistent bloating in your abdomen. This is not related to any significant weight changes though there is some lower abdominal subcutaneous fat that is likely causing your symptoms. We recommend that you continue with your healthy diet and increase your physical activity as much as you can tolerate.  -GASTROESOPHAGEAL REFLUX DISEASE (GERD): You have been managing your GERD with Nexium, which you take twice daily before meals. This medication has been helpful for you, so we will refill your prescription.    INSTRUCTIONS:  Continue taking Benefiber 1 tablespoon at bedtime for bowel regularity. This will help with your constipation. Remember to refill your Nexium prescription for your GERD. Continue with your current treatments for frequent urination, rheumatoid arthritis, and post-concussion syndrome as directed by your respective specialists. Follow-up here in 2 years, sooner as needed.
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
## 2023-03-14 ENCOUNTER — Ambulatory Visit: Payer: Medicare PPO

## 2023-03-14 DIAGNOSIS — M79642 Pain in left hand: Secondary | ICD-10-CM | POA: Diagnosis not present

## 2023-03-14 DIAGNOSIS — S61412A Laceration without foreign body of left hand, initial encounter: Secondary | ICD-10-CM | POA: Diagnosis not present

## 2023-03-15 DIAGNOSIS — S41112A Laceration without foreign body of left upper arm, initial encounter: Secondary | ICD-10-CM | POA: Diagnosis not present

## 2023-03-17 ENCOUNTER — Other Ambulatory Visit: Payer: Self-pay | Admitting: *Deleted

## 2023-03-17 DIAGNOSIS — Z79899 Other long term (current) drug therapy: Secondary | ICD-10-CM | POA: Diagnosis not present

## 2023-03-18 ENCOUNTER — Ambulatory Visit: Payer: Medicare PPO

## 2023-03-18 DIAGNOSIS — R41841 Cognitive communication deficit: Secondary | ICD-10-CM | POA: Diagnosis not present

## 2023-03-18 DIAGNOSIS — R4701 Aphasia: Secondary | ICD-10-CM

## 2023-03-18 LAB — CBC WITH DIFFERENTIAL/PLATELET
Absolute Monocytes: 801 {cells}/uL (ref 200–950)
Basophils Absolute: 62 {cells}/uL (ref 0–200)
Basophils Relative: 0.6 %
Eosinophils Absolute: 146 {cells}/uL (ref 15–500)
Eosinophils Relative: 1.4 %
HCT: 32.3 % — ABNORMAL LOW (ref 35.0–45.0)
Hemoglobin: 10.5 g/dL — ABNORMAL LOW (ref 11.7–15.5)
Lymphs Abs: 1165 {cells}/uL (ref 850–3900)
MCH: 32.7 pg (ref 27.0–33.0)
MCHC: 32.5 g/dL (ref 32.0–36.0)
MCV: 100.6 fL — ABNORMAL HIGH (ref 80.0–100.0)
MPV: 9.5 fL (ref 7.5–12.5)
Monocytes Relative: 7.7 %
Neutro Abs: 8226 {cells}/uL — ABNORMAL HIGH (ref 1500–7800)
Neutrophils Relative %: 79.1 %
Platelets: 540 10*3/uL — ABNORMAL HIGH (ref 140–400)
RBC: 3.21 10*6/uL — ABNORMAL LOW (ref 3.80–5.10)
RDW: 12.8 % (ref 11.0–15.0)
Total Lymphocyte: 11.2 %
WBC: 10.4 10*3/uL (ref 3.8–10.8)

## 2023-03-18 LAB — COMPLETE METABOLIC PANEL WITH GFR
AG Ratio: 1.8 (calc) (ref 1.0–2.5)
ALT: 17 U/L (ref 6–29)
AST: 28 U/L (ref 10–35)
Albumin: 4.2 g/dL (ref 3.6–5.1)
Alkaline phosphatase (APISO): 42 U/L (ref 37–153)
BUN/Creatinine Ratio: 22 (calc) (ref 6–22)
BUN: 21 mg/dL (ref 7–25)
CO2: 23 mmol/L (ref 20–32)
Calcium: 10.2 mg/dL (ref 8.6–10.4)
Chloride: 102 mmol/L (ref 98–110)
Creat: 0.97 mg/dL — ABNORMAL HIGH (ref 0.60–0.95)
Globulin: 2.3 g/dL (ref 1.9–3.7)
Glucose, Bld: 81 mg/dL (ref 65–99)
Potassium: 4.9 mmol/L (ref 3.5–5.3)
Sodium: 134 mmol/L — ABNORMAL LOW (ref 135–146)
Total Bilirubin: 0.5 mg/dL (ref 0.2–1.2)
Total Protein: 6.5 g/dL (ref 6.1–8.1)
eGFR: 59 mL/min/{1.73_m2} — ABNORMAL LOW (ref >=60)

## 2023-03-18 NOTE — Progress Notes (Signed)
Sodium is low and stable.  GFR is low and stable.  Hemoglobin is low and stable.  Platelets are elevated and stable.  We will continue to monitor labs every 3 months.  Please forward results to her PCP.
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
## 2023-04-01 ENCOUNTER — Ambulatory Visit: Payer: Medicare PPO

## 2023-04-01 DIAGNOSIS — R41841 Cognitive communication deficit: Secondary | ICD-10-CM

## 2023-04-01 DIAGNOSIS — R4701 Aphasia: Secondary | ICD-10-CM | POA: Diagnosis not present
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
## 2023-04-02 ENCOUNTER — Ambulatory Visit: Payer: Medicare PPO | Attending: Rheumatology | Admitting: Rheumatology

## 2023-04-02 ENCOUNTER — Encounter: Payer: Self-pay | Admitting: Rheumatology

## 2023-04-02 VITALS — BP 112/70 | HR 64 | Resp 12 | Ht 65.0 in | Wt 136.0 lb

## 2023-04-02 DIAGNOSIS — M47816 Spondylosis without myelopathy or radiculopathy, lumbar region: Secondary | ICD-10-CM | POA: Diagnosis not present

## 2023-04-02 DIAGNOSIS — M0579 Rheumatoid arthritis with rheumatoid factor of multiple sites without organ or systems involvement: Secondary | ICD-10-CM

## 2023-04-02 DIAGNOSIS — M5030 Other cervical disc degeneration, unspecified cervical region: Secondary | ICD-10-CM

## 2023-04-02 DIAGNOSIS — M170 Bilateral primary osteoarthritis of knee: Secondary | ICD-10-CM

## 2023-04-02 DIAGNOSIS — Z8669 Personal history of other diseases of the nervous system and sense organs: Secondary | ICD-10-CM

## 2023-04-02 DIAGNOSIS — M19042 Primary osteoarthritis, left hand: Secondary | ICD-10-CM

## 2023-04-02 DIAGNOSIS — M19041 Primary osteoarthritis, right hand: Secondary | ICD-10-CM

## 2023-04-02 DIAGNOSIS — Z8719 Personal history of other diseases of the digestive system: Secondary | ICD-10-CM

## 2023-04-02 DIAGNOSIS — E222 Syndrome of inappropriate secretion of antidiuretic hormone: Secondary | ICD-10-CM

## 2023-04-02 DIAGNOSIS — M7061 Trochanteric bursitis, right hip: Secondary | ICD-10-CM

## 2023-04-02 DIAGNOSIS — Z8589 Personal history of malignant neoplasm of other organs and systems: Secondary | ICD-10-CM

## 2023-04-02 DIAGNOSIS — Z8639 Personal history of other endocrine, nutritional and metabolic disease: Secondary | ICD-10-CM

## 2023-04-02 DIAGNOSIS — D649 Anemia, unspecified: Secondary | ICD-10-CM | POA: Diagnosis not present

## 2023-04-02 DIAGNOSIS — M810 Age-related osteoporosis without current pathological fracture: Secondary | ICD-10-CM

## 2023-04-02 DIAGNOSIS — Z860100 Personal history of colon polyps, unspecified: Secondary | ICD-10-CM

## 2023-04-02 DIAGNOSIS — I10 Essential (primary) hypertension: Secondary | ICD-10-CM

## 2023-04-02 DIAGNOSIS — R7989 Other specified abnormal findings of blood chemistry: Secondary | ICD-10-CM

## 2023-04-02 DIAGNOSIS — I2510 Atherosclerotic heart disease of native coronary artery without angina pectoris: Secondary | ICD-10-CM

## 2023-04-02 DIAGNOSIS — Z79899 Other long term (current) drug therapy: Secondary | ICD-10-CM | POA: Diagnosis not present

## 2023-04-02 DIAGNOSIS — G629 Polyneuropathy, unspecified: Secondary | ICD-10-CM

## 2023-04-02 MED ORDER — PREDNISONE 5 MG PO TABS
5.0000 mg | ORAL_TABLET | Freq: Every day | ORAL | 0 refills | Status: DC
Start: 2023-04-02 — End: 2023-04-15

## 2023-04-02 NOTE — Patient Instructions (Addendum)
Standing Labs We placed an order today for your standing lab work.   Please have your standing labs drawn in January and every 3 months  Please have your labs drawn 2 weeks prior to your appointment so that the provider can discuss your lab results at your appointment, if possible.  Please note that you may see your imaging and lab results in MyChart before we have reviewed them. We will contact you once all results are reviewed. Please allow our office up to 72 hours to thoroughly review all of the results before contacting the office for clarification of your results.  WALK-IN LAB HOURS  Monday through Thursday from 8:00 am -12:30 pm and 1:00 pm-5:00 pm and Friday from 8:00 am-12:00 pm.  Patients with office visits requiring labs will be seen before walk-in labs.  You may encounter longer than normal wait times. Please allow additional time. Wait times may be shorter on  Monday and Thursday afternoons.  We do not book appointments for walk-in labs. We appreciate your patience and understanding with our staff.   Labs are drawn by [facility]. Please bring your co-pay at the time of your lab draw.  You may receive a bill from [facility] for your lab work.  Please note if you are on Hydroxychloroquine and and an order has been placed for a Hydroxychloroquine level,  you will need to have it drawn 4 hours or more after your last dose.  If you wish to have your labs drawn at another location, please call the office 24 hours in advance so we can fax the orders.  The office is located at [address], [place], [place] [address]   If you have any questions regarding directions or hours of operation,  please call [phone].   As a reminder, please drink plenty of water prior to coming for your lab work. Thanks!   Vaccines You are taking a medication(s) that can suppress your immune system.  The following immunizations are recommended: Flu annually Covid-19  RSV Td/Tdap (tetanus,  diphtheria, pertussis) every 10 years Pneumonia (Prevnar 15 then Pneumovax 23 at least 1 year apart.  Alternatively, can take Prevnar 20 without needing additional dose) Shingrix: 2 doses from 4 weeks to 6 months apart  Please check with your PCP to make sure you are up to date.   If you have signs or symptoms of an infection or start antibiotics: First, call your PCP for workup of your infection. Hold your medication through the infection, until you complete your antibiotics, and until symptoms resolve if you take the following: Injectable medication (Actemra, Benlysta, Cimzia, Cosentyx, Enbrel, Humira, Kevzara, Orencia, Remicade, Simponi, Stelara, Taltz, Tremfya) Methotrexate Leflunomide (Arava) Mycophenolate (Cellcept) Osborne [place], or Rinvoq   Because you are taking Harriette Ohara, Rinvoq, or Olumiant, it is very important to know that this class of medications has a FDA BLACK BOX WARNING for major adverse cardiovascular events (MACE), thrombosis, mortality (including sudden cardiovascular death), serious infections, and lymphomas. MACE is defined as cardiovascular death, myocardial infarction, and stroke. Thrombosis includes deep venous thrombosis (DVT), pulmonary embolism (PE), and arterial thrombosis. If you are a current or former smoker, you are at higher risk for MACE.    Please get an annual skin examination to screen for skin cancer while you are on Rinvoq.  Please use sunscreen and sun protection.

## 2023-04-03 ENCOUNTER — Ambulatory Visit: Payer: Medicare PPO | Admitting: Neurology

## 2023-04-03 ENCOUNTER — Encounter: Payer: Self-pay | Admitting: Neurology

## 2023-04-03 VITALS — BP 150/74 | HR 80 | Ht 65.0 in | Wt 137.0 lb

## 2023-04-03 DIAGNOSIS — F419 Anxiety disorder, unspecified: Secondary | ICD-10-CM

## 2023-04-03 DIAGNOSIS — G629 Polyneuropathy, unspecified: Secondary | ICD-10-CM

## 2023-04-03 DIAGNOSIS — R4189 Other symptoms and signs involving cognitive functions and awareness: Secondary | ICD-10-CM | POA: Diagnosis not present

## 2023-04-03 DIAGNOSIS — F0781 Postconcussional syndrome: Secondary | ICD-10-CM

## 2023-04-03 DIAGNOSIS — G4489 Other headache syndrome: Secondary | ICD-10-CM | POA: Diagnosis not present

## 2023-04-03 DIAGNOSIS — F40240 Claustrophobia: Secondary | ICD-10-CM

## 2023-04-03 DIAGNOSIS — R413 Other amnesia: Secondary | ICD-10-CM | POA: Diagnosis not present

## 2023-04-03 MED ORDER — GABAPENTIN 100 MG PO CAPS: 400.0000 mg | ORAL_CAPSULE | Freq: Every day | ORAL | 3 refills | Status: DC

## 2023-04-03 NOTE — Patient Instructions (Signed)
We will increase your gabapentin to 400 mg at bedtime (4 capsules) to try to help with your neuropathy.  I will see you back in 6 months or sooner if needed.  The physicians and staff at [facility] are committed to providing excellent care. You may receive a survey requesting feedback about your experience at our office. We strive to receive "very good" responses to the survey questions. If you feel that your experience would prevent you from giving the office a "very good " response, please contact our office to try to remedy the situation. We may be reached at [phone]. Thank you for taking the time out of your busy day to complete the survey.  Jacquelyne Balint, MD [facility]  Preventing Falls at [facility] are common, often dreaded events in the lives of older people. Aside from the obvious injuries and even death that may result, fall can cause wide-ranging consequences including loss of independence, mental decline, decreased activity and mobility. Younger people are also at risk of falling, especially those with chronic illnesses and fatigue.  Ways to reduce risk for falling Examine diet and medications. Warm foods and alcohol dilate blood vessels, which can lead to dizziness when standing. Sleep aids, antidepressants and pain medications can also increase the likelihood of a fall.  Get a vision exam. Poor vision, cataracts and glaucoma increase the chances of falling.  Check foot gear. Shoes should fit snugly and have a sturdy, nonskid sole and a broad, low heel  Participate in a physician-approved exercise program to build and maintain muscle strength and improve balance and coordination. Programs that use ankle weights or stretch bands are excellent for muscle-strengthening. Water aerobics programs and low-impact Tai Chi programs have also been shown to improve balance and coordination.  Increase vitamin D intake. Vitamin D improves muscle strength and increases the  amount of calcium the body is able to absorb and deposit in bones.  How to prevent falls from common hazards Floors - Remove all loose wires, cords, and throw rugs. Minimize clutter. Make sure rugs are anchored and smooth. Keep furniture in its usual place.  Chairs -- Use chairs with straight backs, armrests and firm seats. Add firm cushions to existing pieces to add height.  Bathroom - Install grab bars and non-skid tape in the tub or shower. Use a bathtub transfer bench or a shower chair with a back support Use an elevated toilet seat and/or safety rails to assist standing from a low surface. Do not use towel racks or bathroom tissue holders to help you stand.  Lighting - Make sure halls, stairways, and entrances are well-lit. Install a night light in your bathroom or hallway. Make sure there is a light switch at the top and bottom of the staircase. Turn lights on if you get up in the middle of the night. Make sure lamps or light switches are within reach of the bed if you have to get up during the night.  Kitchen - Install non-skid rubber mats near the sink and stove. Clean spills immediately. Store frequently used utensils, pots, pans between waist and eye level. This helps prevent reaching and bending. Sit when getting things out of lower cupboards.  Living room/ Bedrooms - Place furniture with wide spaces in between, giving enough room to move around. Establish a route through the living room that gives you something to hold onto as you walk.  Stairs - Make sure treads, rails, and rugs are secure. Install a rail on both  sides of the stairs. If stairs are a threat, it might be helpful to arrange most of your activities on the lower level to reduce the number of times you must climb the stairs.  Entrances and doorways - Install metal handles on the walls adjacent to the doorknobs of all doors to make it more secure as you travel through the doorway.  Tips for maintaining balance Keep at least  one hand free at all times. Try using a backpack or fanny pack to hold things rather than carrying them in your hands. Never carry objects in both hands when walking as this interferes with keeping your balance.  Attempt to swing both arms from front to back while walking. This might require a conscious effort if Parkinson's disease has diminished your movement. It will, however, help you to maintain balance and posture, and reduce fatigue.  Consciously lift your feet off of the ground when walking. Shuffling and dragging of the feet is a common culprit in losing your balance.  When trying to navigate turns, use a "U" technique of facing forward and making a wide turn, rather than pivoting sharply.  Try to stand with your feet shoulder-length apart. When your feet are close together for any length of time, you increase your risk of losing your balance and falling.  Do one thing at a time. Don't try to walk and accomplish another task, such as reading or looking around. The decrease in your automatic reflexes complicates motor function, so the less distraction, the better.  Do not wear rubber or gripping soled shoes, they might "catch" on the floor and cause tripping.  Move slowly when changing positions. Use deliberate, concentrated movements and, if needed, use a grab bar or walking aid. Count 15 seconds between each movement. For example, when rising from a seated position, wait 15 seconds after standing to begin walking.  If balance is a continuous problem, you might want to consider a walking aid such as a cane, walking stick, or walker. Once you've mastered walking with help, you might be ready to try it on your own again.

## 2023-04-13 ENCOUNTER — Other Ambulatory Visit: Payer: Self-pay | Admitting: Physician Assistant

## 2023-04-13 DIAGNOSIS — M0579 Rheumatoid arthritis with rheumatoid factor of multiple sites without organ or systems involvement: Secondary | ICD-10-CM

## 2023-04-14 DIAGNOSIS — Z0000 Encounter for general adult medical examination without abnormal findings: Secondary | ICD-10-CM | POA: Diagnosis not present

## 2023-04-14 NOTE — Telephone Encounter (Signed)
Last Fill: 12/31/2022  Labs: 03/17/2023 Sodium is low and stable.  GFR is low and stable.  Hemoglobin is low and stable.  Platelets are elevated and stable.   TB Gold: 12/23/2022 Neg    Next Visit: 09/01/2023  Last Visit: 04/02/2023  XL:[id number] arthritis involving multiple sites with positive rheumatoid factor   Current Dose per office note 04/02/2023: Rinvoq 15 mg 1 tablet by mouth daily   Okay to refill Rinvoq?

## 2023-04-15 ENCOUNTER — Ambulatory Visit: Payer: Medicare PPO | Attending: Internal Medicine | Admitting: Internal Medicine

## 2023-04-15 ENCOUNTER — Encounter: Payer: Self-pay | Admitting: Internal Medicine

## 2023-04-15 VITALS — BP 148/70 | HR 61 | Ht 64.5 in | Wt 135.0 lb

## 2023-04-15 DIAGNOSIS — I10 Essential (primary) hypertension: Secondary | ICD-10-CM | POA: Diagnosis not present

## 2023-04-15 DIAGNOSIS — I25119 Atherosclerotic heart disease of native coronary artery with unspecified angina pectoris: Secondary | ICD-10-CM

## 2023-04-15 DIAGNOSIS — R0602 Shortness of breath: Secondary | ICD-10-CM

## 2023-04-15 DIAGNOSIS — R079 Chest pain, unspecified: Secondary | ICD-10-CM

## 2023-04-15 DIAGNOSIS — E785 Hyperlipidemia, unspecified: Secondary | ICD-10-CM
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
## 2023-04-15 NOTE — Patient Instructions (Signed)
Medication Instructions:  Your physician recommends that you continue on your current medications as directed. Please refer to the Current Medication list given to you today.  *If you need a refill on your cardiac medications before your next appointment, please call your pharmacy*   Lab Work: NONE If you have labs (blood work) drawn today and your tests are completely normal, you will receive your results only by: MyChart Message (if you have MyChart) OR A paper copy in the mail If you have any lab test that is abnormal or we need to change your treatment, we will call you to review the results.   Testing/Procedures: NONE   Follow-Up: At [facility], you and your health needs are our priority.  As part of our continuing mission to provide you with exceptional heart care, we have created designated Provider Care Teams.  These Care Teams include your primary Cardiologist (physician) and Advanced Practice Providers (APPs -  Physician Assistants and Nurse Practitioners) who all work together to provide you with the care you need, when you need it.    Your next appointment:   1 year(s)  Provider:   Riley Lam, MD or Tereso Newcomer, [place]

## 2023-04-18 ENCOUNTER — Other Ambulatory Visit: Payer: Self-pay | Admitting: Physician Assistant

## 2023-04-22 ENCOUNTER — Encounter: Payer: Self-pay | Admitting: Internal Medicine

## 2023-04-24 ENCOUNTER — Telehealth: Payer: Self-pay | Admitting: Internal Medicine

## 2023-04-24 NOTE — Telephone Encounter (Signed)
Rescheduled appointment per room/resource. Patient is aware of the changes made to her upcoming appointments.  

## 2023-04-28 ENCOUNTER — Encounter: Payer: Medicare PPO | Admitting: Internal Medicine

## 2023-04-28 ENCOUNTER — Other Ambulatory Visit: Payer: Medicare PPO

## 2023-05-14 ENCOUNTER — Other Ambulatory Visit: Payer: Self-pay | Admitting: Physician Assistant

## 2023-05-19 ENCOUNTER — Other Ambulatory Visit: Payer: Self-pay | Admitting: Medical Oncology

## 2023-05-19 DIAGNOSIS — D75839 Thrombocytosis, unspecified: Secondary | ICD-10-CM

## 2023-05-19 DIAGNOSIS — G4733 Obstructive sleep apnea (adult) (pediatric): Secondary | ICD-10-CM | POA: Diagnosis not present

## 2023-05-19 DIAGNOSIS — D619 Aplastic anemia, unspecified: Secondary | ICD-10-CM

## 2023-05-20 ENCOUNTER — Other Ambulatory Visit: Payer: Self-pay

## 2023-05-20 ENCOUNTER — Telehealth: Payer: Self-pay | Admitting: Internal Medicine

## 2023-05-20 ENCOUNTER — Inpatient Hospital Stay: Payer: Medicare PPO | Attending: Internal Medicine

## 2023-05-20 ENCOUNTER — Inpatient Hospital Stay: Payer: Medicare PPO | Admitting: Internal Medicine

## 2023-05-20 VITALS — BP 148/55 | HR 64 | Temp 97.4°F | Resp 17 | Wt 141.2 lb

## 2023-05-20 DIAGNOSIS — K449 Diaphragmatic hernia without obstruction or gangrene: Secondary | ICD-10-CM | POA: Diagnosis not present

## 2023-05-20 DIAGNOSIS — R9431 Abnormal electrocardiogram [ECG] [EKG]: Secondary | ICD-10-CM

## 2023-05-20 DIAGNOSIS — D649 Anemia, unspecified: Secondary | ICD-10-CM | POA: Diagnosis not present

## 2023-05-20 DIAGNOSIS — Z87891 Personal history of nicotine dependence: Secondary | ICD-10-CM | POA: Insufficient documentation

## 2023-05-20 DIAGNOSIS — K5900 Constipation, unspecified: Secondary | ICD-10-CM | POA: Diagnosis not present

## 2023-05-20 DIAGNOSIS — I517 Cardiomegaly: Secondary | ICD-10-CM

## 2023-05-20 DIAGNOSIS — D75839 Thrombocytosis, unspecified: Secondary | ICD-10-CM

## 2023-05-20 DIAGNOSIS — E611 Iron deficiency: Secondary | ICD-10-CM | POA: Insufficient documentation

## 2023-05-20 DIAGNOSIS — D75838 Other thrombocytosis: Secondary | ICD-10-CM

## 2023-05-20 DIAGNOSIS — D619 Aplastic anemia, unspecified: Secondary | ICD-10-CM

## 2023-05-20 DIAGNOSIS — M069 Rheumatoid arthritis, unspecified: Secondary | ICD-10-CM

## 2023-05-20 LAB — CMP (CANCER CENTER ONLY)
ALT: 21 U/L (ref 0–44)
AST: 31 U/L (ref 15–41)
Albumin: 4.4 g/dL (ref 3.5–5.0)
Alkaline Phosphatase: 37 U/L — ABNORMAL LOW (ref 38–126)
Anion gap: 6 (ref 5–15)
BUN: 22 mg/dL (ref 8–23)
CO2: 24 mmol/L (ref 22–32)
Calcium: 10.1 mg/dL (ref 8.9–10.3)
Chloride: 103 mmol/L (ref 98–111)
Creatinine: 0.92 mg/dL (ref 0.44–1.00)
GFR, Estimated: >60 mL/min (ref >60)
Glucose, Bld: 87 mg/dL (ref 70–99)
Potassium: 4.5 mmol/L (ref 3.5–5.1)
Sodium: 133 mmol/L — ABNORMAL LOW (ref 135–145)
Total Bilirubin: 0.5 mg/dL (ref <1.2)
Total Protein: 6.6 g/dL (ref 6.5–8.1)

## 2023-05-20 LAB — CBC WITH DIFFERENTIAL (CANCER CENTER ONLY)
Abs Immature Granulocytes: 0.05 10*3/uL (ref 0.00–0.07)
Basophils Absolute: 0 10*3/uL (ref 0.0–0.1)
Basophils Relative: 1 %
Eosinophils Absolute: 0 10*3/uL (ref 0.0–0.5)
Eosinophils Relative: 1 %
HCT: 32.8 % — ABNORMAL LOW (ref 36.0–46.0)
Hemoglobin: 10.7 g/dL — ABNORMAL LOW (ref 12.0–15.0)
Immature Granulocytes: 1 %
Lymphocytes Relative: 14 %
Lymphs Abs: 1.1 10*3/uL (ref 0.7–4.0)
MCH: 32.3 pg (ref 26.0–34.0)
MCHC: 32.6 g/dL (ref 30.0–36.0)
MCV: 99.1 fL (ref 80.0–100.0)
Monocytes Absolute: 0.5 10*3/uL (ref 0.1–1.0)
Monocytes Relative: 6 %
Neutro Abs: 6.3 10*3/uL (ref 1.7–7.7)
Neutrophils Relative %: 77 %
Platelet Count: UNDETERMINED 10*3/uL (ref 150–400)
RBC: 3.31 MIL/uL — ABNORMAL LOW (ref 3.87–5.11)
RDW: 13.6 % (ref 11.5–15.5)
WBC Count: 8 10*3/uL (ref 4.0–10.5)
nRBC: 0 % (ref 0.0–0.2)

## 2023-05-20 MED ORDER — INTEGRA PLUS PO CAPS: 1.0000 | ORAL_CAPSULE | Freq: Every day | ORAL | 3 refills | Status: DC

## 2023-05-20 NOTE — Telephone Encounter (Signed)
Pt called back in returning to call to [facility] to schedule a test  Best number [phone]

## 2023-05-20 NOTE — Progress Notes (Signed)
[facility] [phone]) [phone]   [phone]  CONSULT NOTE  REFERRING PHYSICIAN: Dr. Pollyann Savoy  REASON FOR CONSULTATION:  82 years old white female with chronic anemia and thrombocytosis  HPI Felicia [name] is a 82 y.o. female.   Discussed the use of AI scribe software for clinical note transcription with the patient, who gave verbal consent to proceed.  History of Present Illness   The patient, an 82 year old with a history of chronic anemia and rheumatoid arthritis, presents with concerns about elevated platelet counts and persistent fatigue. The patient reports that recent lab results showed high platelet counts, which have been fluctuating over the years. She expresses concern about the potential for blood clotting, although she has no confirmed history of blood clots.  The patient also reports worsening fatigue, which she describes as a "constant battle." She attributes some of this fatigue to her rheumatoid arthritis, which has been particularly problematic in the colder months. The patient also reports shortness of breath upon exertion and is currently undergoing testing for potential heart conditions.  The patient has been taking over-the-counter iron supplements to manage her anemia, but reports constipation as a side effect. She has tried different dosing schedules and types of iron supplements, but the constipation persists. The patient also reports some swelling in her legs, which she attributes to a recent course of prednisone for her rheumatoid arthritis.  The patient has a history of hiatal hernia and takes Nexium twice daily for acid reflux. She also reports a noticeable increase in abdominal size since her last colonoscopy in 2017, which she attributes to gas from the procedure. The patient has a history of smoking during her college years but has since quit. She does not drink alcohol regularly and has no known drug allergies.  The patient  has a family history of dementia, lung disease, heart disease, and ulcerative colitis. She has no known family history of cancer. The patient is not married and does not have children, but she has a supportive network of friends. 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      HPI  Past Medical History:  Diagnosis Date   Adenomatous colon polyp 2004   Colo in 2009 "no polyps"   Complication of anesthesia    small airway per patient   Concussion    Echocardiogram    Echo 8/22 EF 60-65, no RWMA, mild LVH, normal RVSF, moderately elevated PASP (RVSP 50.9), trivial MR, mild AI, AV sclerosis without stenosis   GERD (gastroesophageal reflux disease)    Glaucoma    Hiatal hernia    Hypertension    Neuropathy    Nuclear stress test    Myoview 8/22: EF 65, no ischemia; low risk   Rheumatoid arthritis(714.0)    Orencia; Methotrexate (Dr. Titus Dubin)   Sleep apnea    on CPAP    Past Surgical History:  Procedure Laterality Date   CARDIAC CATHETERIZATION N/A 03/29/2015   Procedure: Left Heart Cath and Coronary Angiography;  Surgeon: Lyn Records, MD;  Location: [facility] INVASIVE CV LAB;  Service: Cardiovascular;  Laterality: N/A;   CATARACT EXTRACTION, BILATERAL     COLONOSCOPY W/ POLYPECTOMY     X1; negative subsequently   DILATION AND CURETTAGE OF UTERUS     EYE SURGERY     due to RA   EYE SURGERY Left 06/2017   eyelid tendon    HYSTEROSCOPY WITH D & C  06/24/2012   Procedure:  DILATATION AND CURETTAGE /HYSTEROSCOPY;  Surgeon: Loney Laurence, MD;  Location: [facility];  Service: Gynecology;  Laterality: N/A;   SHOULDER SURGERY Left    due to RA   SQUAMOUS CELL CARCINOMA EXCISION     scalp and left leg   TRABECULECTOMY     X 2 OD; X 1 OS   UPPER GASTROINTESTINAL ENDOSCOPY     hiatal hernia    Family History  Problem Relation Age of Onset   Dementia Mother    Lung disease Mother        bronchiectasis   Heart disease Mother        Aortic Stenosis    Osteoporosis Mother    Heart attack Father 57       S/P CBAG   Ulcerative colitis Brother    Stroke Maternal Grandmother 57   Diabetes Neg Hx    Cancer Neg Hx    Colon cancer Neg Hx    Esophageal cancer Neg Hx    Stomach cancer Neg Hx    Pancreatic cancer Neg Hx    Liver disease Neg Hx     Social History Social History   Tobacco Use   Smoking status: Former    Current packs/day: 0.00    Average packs/day: 0.5 packs/day for 5.0 years (2.5 ttl pk-yrs)    Types: Cigarettes    Start date: 06/03/1966    Quit date: 06/04/1971    Years since quitting: 51.9    Passive exposure: Never   Smokeless tobacco: Never  Vaping Use   Vaping status: Never Used  Substance Use Topics   Alcohol use: Never   Drug use: No    Allergies  Allergen Reactions   Sulfonamide Derivatives     Rash, redness   Amlodipine Swelling   Norvasc [Amlodipine Besylate]     edema    Current Outpatient Medications  Medication Sig Dispense Refill   Apoaequorin (PREVAGEN PO) Take 1 tablet by mouth daily.     Artificial Tear Solution (GENTEAL TEARS OP) Apply to eye.     Bempedoic Acid-Ezetimibe (NEXLIZET) 180-10 MG TABS TAKE 1 TABLET BY MOUTH DAILY 90 tablet 3   Carboxymethylcellulose Sodium (REFRESH OP) Apply 1 drop to eye daily as needed (FOR DRY EYES).     erythromycin ophthalmic ointment 1 Application at bedtime.     esomeprazole (NEXIUM) 40 MG capsule TAKE 1 CAPSULE BY MOUTH TWICE DAILY BEFORE MEALS 180 capsule 0   estradiol (ESTRACE) 1 MG tablet Take 1 mg by mouth daily.  0   Ferrous Sulfate (IRON PO) Take by mouth.     folic acid (FOLVITE) 1 MG tablet TAKE 2 TABLETS(2 MG) BY MOUTH DAILY 180 tablet 3   gabapentin (NEURONTIN) 100 MG capsule Take 4 capsules (400 mg total) by mouth at bedtime. 360 capsule 3   isosorbide mononitrate (IMDUR) 30 MG 24 hr tablet TAKE 1 TABLET BY MOUTH EVERY DAY 90 tablet 3   latanoprost (XALATAN) 0.005 % ophthalmic solution Place 1 drop into both eyes at bedtime.     losartan  (COZAAR) 100 MG tablet Take 100 mg by mouth daily.     methotrexate (RHEUMATREX) 2.5 MG tablet TAKE 4 TABLETS BY MOUTH 1 TIME A WEEK 48 tablet 0   metoprolol tartrate (LOPRESSOR) 25 MG tablet TAKE 1 TABLET(25 MG) BY MOUTH TWICE DAILY 180 tablet 3   metroNIDAZOLE (METROGEL) 0.75 % gel Apply 1 application topically 2 (two) times daily.      Multiple Vitamins-Minerals (CENTRUM SILVER PO) Take  1 capsule by mouth daily.     Polyethylene Glycol 3350 (MIRALAX PO) Take 1 Dose by mouth as needed.     progesterone (PROMETRIUM) 200 MG capsule Take 200 mg by mouth daily.     RINVOQ 15 MG TB24 TAKE 1 TABLET BY MOUTH EVERY DAY 90 tablet 0   valACYclovir (VALTREX) 1000 MG tablet Take 1,000 mg by mouth as needed (fever blister). Reported on 06/14/2015     Wheat Dextrin (BENEFIBER DRINK MIX PO) Take 1 Scoop by mouth at bedtime.     Zoledronic Acid (RECLAST IV) Inject into the vein once. Once a year     No current facility-administered medications for this visit.    Review of Systems  Constitutional: positive for fatigue Eyes: negative Ears, nose, mouth, throat, and face: negative Respiratory: negative Cardiovascular: negative Gastrointestinal: negative Genitourinary:negative Integument/breast: negative Hematologic/lymphatic: negative Musculoskeletal:positive for arthralgias Neurological: negative Behavioral/Psych: negative Endocrine: negative Allergic/Immunologic: negative  Physical Exam  ZOX:WRUEA, healthy, no distress, well nourished, and well developed SKIN: skin color, texture, turgor are normal, no rashes or significant lesions HEAD: Normocephalic, No masses, lesions, tenderness or abnormalities EYES: normal, PERRLA, Conjunctiva are pink and non-injected EARS: External ears normal, Canals clear OROPHARYNX:no exudate, no erythema, and lips, buccal mucosa, and tongue normal  NECK: supple, no adenopathy, no JVD LYMPH:  no palpable lymphadenopathy, no hepatosplenomegaly BREAST:not  examined LUNGS: clear to auscultation , and palpation HEART: regular rate & rhythm, no murmurs, and no gallops ABDOMEN:abdomen soft, non-tender, normal bowel sounds, and no masses or organomegaly BACK: Back symmetric, no curvature., No CVA tenderness EXTREMITIES:no joint deformities, effusion, or inflammation, no edema  NEURO: alert & oriented x 3 with fluent speech, no focal motor/sensory deficits  PERFORMANCE STATUS: ECOG 1  LABORATORY DATA: Lab Results  Component Value Date   WBC 10.4 03/17/2023   HGB 10.5 (L) 03/17/2023   HCT 32.3 (L) 03/17/2023   MCV 100.6 (H) 03/17/2023   PLT 540 (H) 03/17/2023      Chemistry      Component Value Date/Time   NA 134 (L) 03/17/2023 1037   NA 135 04/08/2022 1542   K 4.9 03/17/2023 1037   CL 102 03/17/2023 1037   CO2 23 03/17/2023 1037   BUN 21 03/17/2023 1037   BUN 21 04/08/2022 1542   CREATININE 0.97 (H) 03/17/2023 1037      Component Value Date/Time   CALCIUM 10.2 03/17/2023 1037   ALKPHOS 39 (L) 07/31/2021 0907   AST 28 03/17/2023 1037   ALT 17 03/17/2023 1037   BILITOT 0.5 03/17/2023 1037   BILITOT 0.5 07/31/2021 0907       RADIOGRAPHIC STUDIES: No results found.  ASSESSMENT AND PLAN: This is a very pleasant 82 years old white female with history of chronic anemia likely anemia of chronic disease +/- iron deficiency in addition to thrombocytosis likely reactive in nature secondary to iron deficiency and inflammatory process.  Chronic Anemia Chronic anemia likely multifactorial, including iron deficiency and chronic disease due to rheumatoid arthritis. Hemoglobin levels around 10-12 g/dL, platelet counts vary from normal to elevated (up to 581). Correcting iron deficiency should normalize platelet counts. If iron levels are normal, further investigation for essential thrombocythemia (ET) will be considered. - Order iron studies - If iron studies show deficiency, arrange for iron infusion - Prescribe Integraplus, one capsule  daily - Follow up in three months with repeat blood work  Reactive Thrombocytosis Elevated platelet counts likely secondary to iron deficiency anemia. Differential includes essential thrombocythemia, but current evidence  supports reactive thrombocytosis. If platelet counts remain elevated after correcting iron deficiency, a JAK2 mutation test will be ordered to rule out ET. - Monitor platelet counts - If platelet counts remain elevated after correcting iron deficiency, order JAK2 mutation test  Rheumatoid Arthritis Chronic rheumatoid arthritis with increased symptoms during cold weather. Managed with methotrexate, which may contribute to anemia by suppressing bone marrow. - Continue methotrexate - Monitor for anemia and adjust treatment as necessary  Constipation Constipation likely secondary to iron supplementation with ferrous sulfate, causing gastrointestinal side effects. Integraplus may be gentler on the stomach. - Switch to Integraplus - Consider stool softeners if constipation persists  Hiatal Hernia Hiatal hernia managed with Nexium twice daily. No recent endoscopic evaluations. - Continue Nexium twice daily  General Health Maintenance Discussed diet and exercise for managing pre-diabetes. No colonoscopy since 2017; last colonoscopy in 2009 showed polyps. - Encourage dietary modifications for pre-diabetes - Schedule follow-up colonoscopy given history of polyps  Follow-up - Schedule follow-up appointment in three months - Repeat blood work at follow-up.   The patient was advised to call immediately if she has any other concerning symptoms in the interval. The patient voices understanding of current disease status and treatment options and is in agreement with the current care plan.  All questions were answered. The patient knows to call the clinic with any problems, questions or concerns. We can certainly see the patient much sooner if necessary.  Thank you so much for  allowing me to participate in the care of Felicia [name]. I will continue to follow up the patient with you and assist in her care.  The total time spent in the appointment was 60 minutes.  Disclaimer: This note was dictated with voice recognition software. Similar sounding words can inadvertently be transcribed and may not be corrected upon review.   Lajuana Matte May 20, 2023, 11:34 AM

## 2023-05-20 NOTE — Progress Notes (Signed)
Placed an order for Cardiac amyloidosis screen per MD request.

## 2023-05-20 NOTE — Telephone Encounter (Signed)
Called pt advised of MD request to have screening for Cardiac Amyloid.  Pt will need labs drawn through [facility]  as well as scan.  Pt expresses understanding all questions answered.

## 2023-05-21 ENCOUNTER — Other Ambulatory Visit: Payer: Self-pay | Admitting: Rheumatology

## 2023-05-21 ENCOUNTER — Inpatient Hospital Stay: Payer: Medicare PPO

## 2023-05-21 DIAGNOSIS — D649 Anemia, unspecified: Secondary | ICD-10-CM | POA: Diagnosis not present

## 2023-05-21 DIAGNOSIS — K449 Diaphragmatic hernia without obstruction or gangrene: Secondary | ICD-10-CM | POA: Diagnosis not present

## 2023-05-21 DIAGNOSIS — D619 Aplastic anemia, unspecified: Secondary | ICD-10-CM

## 2023-05-21 DIAGNOSIS — M069 Rheumatoid arthritis, unspecified: Secondary | ICD-10-CM | POA: Diagnosis not present

## 2023-05-21 DIAGNOSIS — K5900 Constipation, unspecified: Secondary | ICD-10-CM | POA: Diagnosis not present

## 2023-05-21 DIAGNOSIS — D75838 Other thrombocytosis: Secondary | ICD-10-CM | POA: Diagnosis not present

## 2023-05-21 DIAGNOSIS — D75839 Thrombocytosis, unspecified: Secondary | ICD-10-CM

## 2023-05-21 DIAGNOSIS — E611 Iron deficiency: Secondary | ICD-10-CM | POA: Diagnosis not present

## 2023-05-21 DIAGNOSIS — Z87891 Personal history of nicotine dependence: Secondary | ICD-10-CM | POA: Diagnosis not present

## 2023-05-21 LAB — IRON AND IRON BINDING CAPACITY (CC-WL,HP ONLY)
Iron: 99 ug/dL (ref 28–170)
Saturation Ratios: 23 % (ref 10.4–31.8)
TIBC: 428 ug/dL (ref 250–450)
UIBC: 329 ug/dL (ref 148–442)

## 2023-05-21 LAB — FOLATE: Folate: 28.5 ng/mL (ref >5.9)

## 2023-05-21 LAB — VITAMIN B12: Vitamin B-12: 548 pg/mL (ref 180–914)

## 2023-05-21 LAB — FERRITIN: Ferritin: 126 ng/mL (ref 11–307)

## 2023-05-22 NOTE — Telephone Encounter (Signed)
Last Fill: 02/27/2023  Labs: 05/20/2023 RBC 3.31, Hgb 10.7, Hct 32.8, Sodium 133, Alk. Phos 37  Next Visit: 09/01/2023  Last Visit: 04/02/2023  DX: Rheumatoid arthritis involving multiple sites with positive rheumatoid factor   Current Dose per office note 04/02/2023: Methotrexate 4 tablets by mouth every 7 days   Okay to refill Methotrexate?

## 2023-05-25 LAB — PROTEIN ELECTROPHORESIS, SERUM, WITH REFLEX
A/G Ratio: 1.8 — ABNORMAL HIGH (ref 0.7–1.7)
Albumin ELP: 4.2 g/dL (ref 2.9–4.4)
Alpha-1-Globulin: 0.2 g/dL (ref 0.0–0.4)
Alpha-2-Globulin: 0.6 g/dL (ref 0.4–1.0)
Beta Globulin: 0.9 g/dL (ref 0.7–1.3)
Gamma Globulin: 0.7 g/dL (ref 0.4–1.8)
Globulin, Total: 2.4 g/dL (ref 2.2–3.9)
Total Protein ELP: 6.6 g/dL (ref 6.0–8.5)

## 2023-05-29 DIAGNOSIS — H401233 Low-tension glaucoma, bilateral, severe stage: Secondary | ICD-10-CM | POA: Diagnosis not present

## 2023-05-30 ENCOUNTER — Telehealth: Payer: Self-pay | Admitting: Internal Medicine

## 2023-05-30 NOTE — Telephone Encounter (Signed)
  Pt called, she made an appt with [facility] on Monday 06/02/23 at 10:15 am for her lab work ATTR-CM. When she was setting up the appt there's a reminder to bring doctor's order. She wants to make sure that the order has been sent to [facility] at [address] 1st floor

## 2023-05-30 NOTE — Telephone Encounter (Signed)
Left a detailed message advising pt that orders are in [facility] system.  I have printed out orders and leaving at front desk in case pt would like to have a copy of orders.

## 2023-06-03 DIAGNOSIS — R9431 Abnormal electrocardiogram [ECG] [EKG]: Secondary | ICD-10-CM | POA: Diagnosis not present

## 2023-06-03 DIAGNOSIS — I517 Cardiomegaly: Secondary | ICD-10-CM | POA: Diagnosis not present

## 2023-06-05 ENCOUNTER — Ambulatory Visit: Payer: Medicare PPO | Admitting: Family Medicine

## 2023-06-05 ENCOUNTER — Telehealth (HOSPITAL_COMMUNITY): Payer: Self-pay | Admitting: *Deleted

## 2023-06-05 ENCOUNTER — Encounter: Payer: Self-pay | Admitting: Family Medicine

## 2023-06-05 VITALS — BP 146/82 | HR 65 | Ht 64.5 in | Wt 136.0 lb

## 2023-06-05 DIAGNOSIS — F0781 Postconcussional syndrome: Secondary | ICD-10-CM | POA: Diagnosis not present

## 2023-06-05 NOTE — Telephone Encounter (Signed)
 Patient reminded of amyloid study on 06/09/2023 at 12:30

## 2023-06-05 NOTE — Patient Instructions (Signed)
Thank you for coming in today.   Check back as needed 
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
## 2023-06-09 ENCOUNTER — Ambulatory Visit (HOSPITAL_COMMUNITY): Payer: Medicare PPO | Attending: Internal Medicine

## 2023-06-09 DIAGNOSIS — R9431 Abnormal electrocardiogram [ECG] [EKG]: Secondary | ICD-10-CM | POA: Diagnosis not present

## 2023-06-09 DIAGNOSIS — I517 Cardiomegaly: Secondary | ICD-10-CM | POA: Insufficient documentation

## 2023-06-09 MED ORDER — TECHNETIUM TC 99M PYROPHOSPHATE
21.2000 | Freq: Once | INTRAVENOUS | Status: AC
  Administered 2023-06-09: 21.2 via INTRAVENOUS

## 2023-06-10 LAB — MYOCARDIAL AMYLOID PLANAR & SPECT: H/CL Ratio: 1.33

## 2023-06-12 ENCOUNTER — Encounter: Payer: Self-pay | Admitting: Internal Medicine

## 2023-06-13 LAB — UPEP/UIFE/LIGHT CHAINS/TP, 24-HR UR
% BETA, Urine: 13.5 %
ALBUMIN, U: 74.2 %
ALPHA 1 URINE: 5 %
ALPHA-2-GLOBULIN, U: 2.9 %
Free Kappa Lt Chains,Ur: 6.33 mg/L (ref 1.17–86.46)
Free Lambda Lt Chains,Ur: 2.93 mg/L (ref 0.27–15.21)
GAMMA GLOBULIN URINE: 4.4 %
Kappa/Lambda Ratio,U: 2.16 (ref 1.83–14.26)
Protein, 24H Urine: 443 mg/(24.h) — ABNORMAL HIGH (ref 30–150)
Protein, Ur: 16.4 mg/dL

## 2023-06-13 LAB — MULTIPLE MYELOMA PANEL, SERUM
Albumin SerPl Elph-Mcnc: 4 g/dL (ref 2.9–4.4)
Albumin/Glob SerPl: 1.7 (ref 0.7–1.7)
Alpha 1: 0.3 g/dL (ref 0.0–0.4)
Alpha2 Glob SerPl Elph-Mcnc: 0.6 g/dL (ref 0.4–1.0)
B-Globulin SerPl Elph-Mcnc: 1 g/dL (ref 0.7–1.3)
Gamma Glob SerPl Elph-Mcnc: 0.6 g/dL (ref 0.4–1.8)
Globulin, Total: 2.5 g/dL (ref 2.2–3.9)
IgA/Immunoglobulin A, Serum: 140 mg/dL (ref 64–422)
IgG (Immunoglobin G), Serum: 726 mg/dL (ref 586–1602)
IgM (Immunoglobulin M), Srm: 60 mg/dL (ref 26–217)
Total Protein: 6.5 g/dL (ref 6.0–8.5)

## 2023-06-19 ENCOUNTER — Encounter: Payer: Self-pay | Admitting: Internal Medicine

## 2023-06-24 DIAGNOSIS — R350 Frequency of micturition: Secondary | ICD-10-CM | POA: Diagnosis not present

## 2023-07-03 DIAGNOSIS — R350 Frequency of micturition: Secondary | ICD-10-CM | POA: Diagnosis not present

## 2023-07-05 ENCOUNTER — Other Ambulatory Visit: Payer: Self-pay | Admitting: Physician Assistant

## 2023-07-05 DIAGNOSIS — M0579 Rheumatoid arthritis with rheumatoid factor of multiple sites without organ or systems involvement: Secondary | ICD-10-CM

## 2023-07-07 NOTE — Telephone Encounter (Signed)
Last Fill: 04/14/2023  Labs: 21/17/2024 Sodium 133, Alk. Phos 37, RBC 3.31, Hgb 10.7, Hct 32.8  TB Gold: 12/23/2022 Neg    Next Visit: 09/01/2023  Last Visit: 04/02/2023  [id number] arthritis involving multiple sites with positive rheumatoid factor   Current Dose per office note 04/02/2023: Rinvoq 15 mg 1 tablet by mouth daily   Okay to refill Rinvoq?

## 2023-07-14 DIAGNOSIS — R3129 Other microscopic hematuria: Secondary | ICD-10-CM | POA: Insufficient documentation

## 2023-07-18 DIAGNOSIS — N3946 Mixed incontinence: Secondary | ICD-10-CM | POA: Insufficient documentation

## 2023-07-25 ENCOUNTER — Telehealth: Payer: Self-pay | Admitting: Rheumatology

## 2023-07-25 ENCOUNTER — Ambulatory Visit: Payer: Medicare PPO | Attending: Rheumatology | Admitting: Rheumatology

## 2023-07-25 ENCOUNTER — Emergency Department (HOSPITAL_COMMUNITY)
Admission: EM | Admit: 2023-07-25 | Discharge: 2023-07-25 | Disposition: A | Discharge status: Home / Self Care | Payer: Medicare PPO | Attending: Emergency Medicine | Admitting: Emergency Medicine

## 2023-07-25 ENCOUNTER — Encounter: Payer: Self-pay | Admitting: Rheumatology

## 2023-07-25 ENCOUNTER — Other Ambulatory Visit: Payer: Self-pay

## 2023-07-25 VITALS — BP 199/84 | HR 65 | Resp 13 | Ht 65.0 in | Wt 139.2 lb

## 2023-07-25 DIAGNOSIS — Z79899 Other long term (current) drug therapy: Secondary | ICD-10-CM | POA: Diagnosis not present

## 2023-07-25 DIAGNOSIS — M19041 Primary osteoarthritis, right hand: Secondary | ICD-10-CM | POA: Diagnosis not present

## 2023-07-25 DIAGNOSIS — Z8719 Personal history of other diseases of the digestive system: Secondary | ICD-10-CM

## 2023-07-25 DIAGNOSIS — D75839 Thrombocytosis, unspecified: Secondary | ICD-10-CM | POA: Diagnosis not present

## 2023-07-25 DIAGNOSIS — M5030 Other cervical disc degeneration, unspecified cervical region: Secondary | ICD-10-CM | POA: Diagnosis not present

## 2023-07-25 DIAGNOSIS — G629 Polyneuropathy, unspecified: Secondary | ICD-10-CM

## 2023-07-25 DIAGNOSIS — M47816 Spondylosis without myelopathy or radiculopathy, lumbar region: Secondary | ICD-10-CM | POA: Diagnosis not present

## 2023-07-25 DIAGNOSIS — Z8639 Personal history of other endocrine, nutritional and metabolic disease: Secondary | ICD-10-CM

## 2023-07-25 DIAGNOSIS — M810 Age-related osteoporosis without current pathological fracture: Secondary | ICD-10-CM

## 2023-07-25 DIAGNOSIS — M19042 Primary osteoarthritis, left hand: Secondary | ICD-10-CM

## 2023-07-25 DIAGNOSIS — M7062 Trochanteric bursitis, left hip: Secondary | ICD-10-CM

## 2023-07-25 DIAGNOSIS — E222 Syndrome of inappropriate secretion of antidiuretic hormone: Secondary | ICD-10-CM

## 2023-07-25 DIAGNOSIS — I2510 Atherosclerotic heart disease of native coronary artery without angina pectoris: Secondary | ICD-10-CM

## 2023-07-25 DIAGNOSIS — M170 Bilateral primary osteoarthritis of knee: Secondary | ICD-10-CM

## 2023-07-25 DIAGNOSIS — Z860100 Personal history of colon polyps, unspecified: Secondary | ICD-10-CM

## 2023-07-25 DIAGNOSIS — D649 Anemia, unspecified: Secondary | ICD-10-CM

## 2023-07-25 DIAGNOSIS — M25552 Pain in left hip: Secondary | ICD-10-CM | POA: Insufficient documentation

## 2023-07-25 DIAGNOSIS — I10 Essential (primary) hypertension: Secondary | ICD-10-CM | POA: Insufficient documentation

## 2023-07-25 DIAGNOSIS — Z8669 Personal history of other diseases of the nervous system and sense organs: Secondary | ICD-10-CM

## 2023-07-25 DIAGNOSIS — M0579 Rheumatoid arthritis with rheumatoid factor of multiple sites without organ or systems involvement: Secondary | ICD-10-CM | POA: Diagnosis not present

## 2023-07-25 DIAGNOSIS — Z8589 Personal history of malignant neoplasm of other organs and systems: Secondary | ICD-10-CM

## 2023-07-25 LAB — CBC
HCT: 33.1 % — ABNORMAL LOW (ref 36.0–46.0)
Hemoglobin: 11.2 g/dL — ABNORMAL LOW (ref 12.0–15.0)
MCH: 32.6 pg (ref 26.0–34.0)
MCHC: 33.8 g/dL (ref 30.0–36.0)
MCV: 96.2 fL (ref 80.0–100.0)
Platelets: 433 10*3/uL — ABNORMAL HIGH (ref 150–400)
RBC: 3.44 MIL/uL — ABNORMAL LOW (ref 3.87–5.11)
RDW: 13.4 % (ref 11.5–15.5)
WBC: 8 10*3/uL (ref 4.0–10.5)
nRBC: 0 % (ref 0.0–0.2)

## 2023-07-25 LAB — BASIC METABOLIC PANEL
Anion gap: 8 (ref 5–15)
BUN: 14 mg/dL (ref 8–23)
CO2: 21 mmol/L — ABNORMAL LOW (ref 22–32)
Calcium: 9.6 mg/dL (ref 8.9–10.3)
Chloride: 106 mmol/L (ref 98–111)
Creatinine, Ser: 0.86 mg/dL (ref 0.44–1.00)
GFR, Estimated: >60 mL/min (ref >60)
Glucose, Bld: 97 mg/dL (ref 70–99)
Potassium: 4.1 mmol/L (ref 3.5–5.1)
Sodium: 135 mmol/L (ref 135–145)

## 2023-07-25 MED ORDER — ACETAMINOPHEN 500 MG PO TABS
1000.0000 mg | ORAL_TABLET | Freq: Once | ORAL | Status: AC
  Administered 2023-07-25: 1000 mg via ORAL
  Filled 2023-07-25: qty 2

## 2023-07-25 NOTE — Telephone Encounter (Signed)
Reached out to patient. Patient states she has been having pain in her left hip since Tuesday. Patient states it is a sharp constant pain. Patient states it is sore to the touch. Patient denies any injury or fall. Patient states she has been taking MTX and Rinvoq as prescribed. Patient has been scheduled for today at 10:00 am.
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
## 2023-07-25 NOTE — Progress Notes (Signed)
Office Visit Note  Patient: Felicia [name]             Date of Birth: [birth year]           MRN: [id number]             PCP: Sigmund Hazel, MD Referring: Sigmund Hazel, MD Visit Date: 07/25/2023 Occupation: @GUAROCC @  Subjective:  Left hip pain  History of Present Illness: Felicia [name] is a 83 y.o. female with seropositive rheumatoid arthritis, osteoarthritis, degenerative disc disease and osteoporosis.  She states she is still gradually trying to recover from the concussion injury she had.  She has been going to physical therapy.  She states she has been having severe pain and discomfort in her left hip for the last 3 days.  She states she the pain started even before that but it became more severe in the last 3 days.  She also has some discomfort in her left knee and left foot which she believes is the pain radiating into the knee and foot.  She has not had a flare of rheumatoid arthritis.  She has been on Rinvoq 15 mg daily and methotrexate 4 tablets weekly with folic acid.  She had no interruption in her treatment.  She is on drug holiday for Reclast infusions.  Her last Reclast was in June 2023.    Activities of Daily Living:  Patient reports morning stiffness for 1 hour.   Patient Reports nocturnal pain.  Difficulty dressing/grooming: Denies Difficulty climbing stairs: Reports Difficulty getting out of chair: Reports Difficulty using hands for taps, buttons, cutlery, and/or writing: Reports  Review of Systems  Constitutional:  Positive for fatigue.  HENT:  Positive for mouth dryness. Negative for mouth sores.   Eyes:  Positive for dryness.  Respiratory:  Negative for difficulty breathing.   Cardiovascular:  Positive for palpitations. Negative for chest pain.  Gastrointestinal:  Positive for blood in stool and constipation. Negative for diarrhea.  Endocrine: Positive for increased urination.  Genitourinary:  Positive for involuntary urination.  Musculoskeletal:  Positive for joint  pain, gait problem, joint pain, myalgias, morning stiffness, muscle tenderness and myalgias. Negative for joint swelling and muscle weakness.  Skin:  Negative for color change, rash, hair loss and sensitivity to sunlight.  Allergic/Immunologic: Negative for susceptible to infections.  Neurological:  Positive for headaches. Negative for dizziness.  Hematological:  Negative for swollen glands.  Psychiatric/Behavioral:  Positive for sleep disturbance. Negative for depressed mood. The patient is not nervous/anxious.     PMFS History:  Patient Active Problem List   Diagnosis Date Noted   Right-sided low back pain without sciatica 06/21/2022   Coronary artery disease involving native coronary artery with angina pectoris (HCC) 04/08/2022   Pulmonary HTN (HCC) 04/08/2022   Shortness of breath 04/08/2022   Greater trochanteric pain syndrome of right lower extremity 10/23/2021   Age-related osteoporosis without current pathological fracture 06/26/2021   Aplastic anemia (HCC) 06/26/2021   Hiatal hernia 06/26/2021   Prediabetes 06/26/2021   Neuropathy 06/16/2020   SIADH (syndrome of inappropriate ADH production) (HCC) 02/18/2020   Hyponatremia 11/18/2019   Thrombocytosis 08/01/2018   URI (upper respiratory infection) 04/14/2017   Primary osteoarthritis of both knees 03/06/2017   Exertional chest pain    Pain in the chest 03/27/2015   Postural hypotension 05/26/2013   OSA (obstructive sleep apnea) 03/22/2013   Lens replaced by other means 08/25/2012   Low tension glaucoma 02/14/2012   Osteoarthritis 11/07/2011   Abdominal bruit 10/16/2011  Essential hypertension 08/13/2011   Anisocoria 08/13/2011   Cervical radiculopathy 07/17/2011   DDD (degenerative disc disease), cervical 07/10/2011   Glaucoma 06/06/2011   HIATAL HERNIA 01/30/2010   POSTMENOPAUSAL SYNDROME 12/14/2009   EUSTACHIAN TUBE DYSFUNCTION, RIGHT 06/28/2009   GERD 12/07/2008   Rheumatoid arthritis (HCC) 12/07/2008   History  of colonic polyps 12/07/2008   Hyperlipidemia LDL goal <70 10/17/2006   HYPERCALCEMIA 10/17/2006   Mitral valve disease 10/17/2006    Past Medical History:  Diagnosis Date   Adenomatous colon polyp 2004   Colo in 2009 "no polyps"   Complication of anesthesia    small airway per patient   Concussion    Echocardiogram    Echo 8/22 EF 60-65, no RWMA, mild LVH, normal RVSF, moderately elevated PASP (RVSP 50.9), trivial MR, mild AI, AV sclerosis without stenosis   GERD (gastroesophageal reflux disease)    Glaucoma    Hiatal hernia    Hypertension    Neuropathy    Nuclear stress test    Myoview 8/22: EF 65, no ischemia; low risk   Rheumatoid arthritis(714.0)    Orencia; Methotrexate (Dr. Titus Dubin)   Sleep apnea    on CPAP    Family History  Problem Relation Age of Onset   Dementia Mother    Lung disease Mother        bronchiectasis   Heart disease Mother        Aortic Stenosis   Osteoporosis Mother    Heart attack Father 87       S/P CBAG   Ulcerative colitis Brother    Stroke Maternal Grandmother 63   Diabetes Neg Hx    Cancer Neg Hx    Colon cancer Neg Hx    Esophageal cancer Neg Hx    Stomach cancer Neg Hx    Pancreatic cancer Neg Hx    Liver disease Neg Hx    Past Surgical History:  Procedure Laterality Date   CARDIAC CATHETERIZATION N/A 03/29/2015   Procedure: Left Heart Cath and Coronary Angiography;  Surgeon: Lyn Records, MD;  Location: [facility] INVASIVE CV LAB;  Service: Cardiovascular;  Laterality: N/A;   CATARACT EXTRACTION, BILATERAL     COLONOSCOPY W/ POLYPECTOMY     X1; negative subsequently   DILATION AND CURETTAGE OF UTERUS     EYE SURGERY     due to RA   EYE SURGERY Left 06/2017   eyelid tendon    HYSTEROSCOPY WITH D & C  06/24/2012   Procedure: DILATATION AND CURETTAGE /HYSTEROSCOPY;  Surgeon: Loney Laurence, MD;  Location: [facility];  Service: Gynecology;  Laterality: N/A;   SHOULDER SURGERY Left    due to RA   SQUAMOUS CELL CARCINOMA EXCISION      scalp and left leg   TRABECULECTOMY     X 2 OD; X 1 OS   UPPER GASTROINTESTINAL ENDOSCOPY     hiatal hernia   Social History   Social History Narrative   Single   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 Left to write and right with every thing else    Are you currently employed ?    What is your current occupation?retire   Do you live at home alone? no   Who lives with you? Family friend   What type of home do you live in: 1 story or 2 story? one       Immunization History  Administered Date(s) Administered   Influenza Split 03/08/2014   Influenza Whole 04/07/2007, 02/24/2008, 03/10/2009, 03/03/2012   Influenza, High Dose Seasonal PF 01/15/2016, 03/07/2017, 01/27/2019   Influenza,inj,Quad PF,6+ Mos 03/15/2013, 02/21/2015, 03/17/2018   Influenza-Unspecified 03/01/2021   PFIZER(Purple Top)SARS-COV-2 Vaccination 06/24/2019, 07/13/2019, 01/15/2020, 06/26/2020   PPD Test 03/10/2012   Pneumococcal Conjugate-13 09/12/2015   Pneumococcal Polysaccharide-23 03/25/2003, 04/12/2008   Td 12/14/2009   Zoster Recombinant(Shingrix) 03/23/2018     Objective: Vital Signs: BP (!) 191/75 (BP Location: Left Arm, Patient Position: Sitting)   Pulse 65   Resp 13   Ht 5\' 5"  (1.651 m)   Wt 139 lb 3.2 oz (63.1 kg)   BMI 23.16 kg/m    Physical Exam Vitals and nursing note reviewed.  Constitutional:      Appearance: She is well-developed.  HENT:     Head: Normocephalic and atraumatic.  Eyes:     Conjunctiva/sclera: Conjunctivae normal.  Cardiovascular:     Rate and Rhythm: Normal rate and regular rhythm.     Heart sounds: Normal heart sounds.  Pulmonary:     Effort: Pulmonary effort is normal.     Breath sounds: Normal breath sounds.  Abdominal:     General: Bowel sounds are normal.     Palpations: Abdomen is soft.  Musculoskeletal:     Cervical back: Normal range of motion.  Lymphadenopathy:     Cervical: No cervical  adenopathy.  Skin:    General: Skin is warm and dry.     Capillary Refill: Capillary refill takes less than 2 seconds.  Neurological:     Mental Status: She is alert and oriented to person, place, and time.  Psychiatric:        Behavior: Behavior normal.      Musculoskeletal Exam: She had some limitation with lateral rotation of the cervical spine.  Thoracic and lumbar spine could not be assessed.  Shoulders, elbows, wrist, MCPs PIPs and DIPs in good range of motion.  She had thickening of the bilateral second PIP joints.  Hip joints were in good range of motion.  She had tenderness over left trochanteric bursa.  Knee joints in good range of motion without any warmth swelling or effusion.  There was no tenderness over ankles or MTPs.  CDAI Exam: CDAI Score: -- Patient Global: 40 / 100; Provider Global: 10 / 100 Swollen: --; Tender: -- Joint Exam 07/25/2023   No joint exam has been documented for this visit   There is currently no information documented on the homunculus. Go to the Rheumatology activity and complete the homunculus joint exam.  Investigation: No additional findings.  Imaging: No results found.  Recent Labs: Lab Results  Component Value Date   WBC 8.0 05/20/2023   HGB 10.7 (L) 05/20/2023   PLT PLATELET CLUMPS NOTED ON SMEAR, UNABLE TO ESTIMATE 05/20/2023   NA 133 (L) 05/20/2023   K 4.5 05/20/2023   CL 103 05/20/2023   CO2 24 05/20/2023   GLUCOSE 87 05/20/2023   BUN 22 05/20/2023   CREATININE 0.92 05/20/2023   BILITOT 0.5 05/20/2023   ALKPHOS 37 (L) 05/20/2023   AST 31 05/20/2023   ALT 21 05/20/2023   PROT 6.5 06/03/2023   ALBUMIN 4.4 05/20/2023   CALCIUM 10.1 05/20/2023   GFRAA 76 10/02/2020   QFTBGOLDPLUS NEGATIVE 12/23/2022    Speciality Comments: TB Gold: 01/22/2022 Neg  Reclast first infusion was given 08/10/2019, 09/18/2020, 11/06/21. Drug holiday x 2 years Rinvoq started September 06, 2020  Procedures:  No procedures  performed Allergies:  Sulfonamide derivatives, Amlodipine, and Norvasc [amlodipine besylate]   Assessment / Plan:     Visit Diagnoses: Rheumatoid arthritis involving multiple sites with positive rheumatoid factor (HCC) - +RF, +ANA: She complains of pain and discomfort in her left hip which has been more severe in the last 3 days.  She states she is been going to physical therapy which has been helpful.  She has not noticed joint swelling in any other joints.  She had a flare of rheumatoid arthritis in October which required prednisone taper.  She has not had a flare since then.  High risk medication use - Rinvoq 15 mg 1 tablet by mouth daily, Methotrexate 4 tablets by mouth every 7 days, and folic acid 1 mg 2 tablets daily.  Labs from May 20, 2023 CBC and CMP were stable with hemoglobin low at 10.7.  Chronic anemia - referred to hematology in the past.  Thrombocytosis-she continues to have elevated platelets.  Primary osteoarthritis of both hands-she has rheumatoid arthritis and osteoarthritis overlap with bilateral PIP and DIP thickening.  Trochanteric bursitis, left hip-she had tender over left trochanteric bursa.  Clinical features were suggestive of trochanteric bursitis.  I did detailed discussion with the patient.  I could not give her a cortisone injection because her blood pressure was elevated at 199/84.  After resting it came down to 174/83.  She was advised to monitor blood pressure closely.  If her blood pressure comes down she can come in for a cortisone injection.  A handout on IT band stretches was given.  Primary osteoarthritis of both knees-she is currently not having much discomfort in her knee joints.  She states the pain, hip radiates into her knee.  DDD (degenerative disc disease), cervical-she had lateral rotation of the cervical spine.  Spondylosis of lumbar spine-she has chronic intermittent discomfort in her lower back.  Age-related osteoporosis without current pathological fracture -  December 03, 2021 T-score -2.1, BMD 0.571 in the right one third radius. Reclast infusions in 2021, 2022 and June 2023.  She is on a drug holiday.  Will repeat DEXA scan in July 2025.  History of vitamin D deficiency-she has been taking vitamin D.  Other medical problems listed as follows:  History of squamous cell carcinoma  SIADH (syndrome of inappropriate ADH production) (HCC)  History of glaucoma  History of sleep apnea  Neuropathy  History of hyperlipidemia - Plan: Lipid panel  Essential hypertension  Coronary artery disease involving native coronary artery of native heart without angina pectoris  History of colonic polyps  History of gastroesophageal reflux (GERD)  Orders: Orders Placed This Encounter  Procedures   Lipid panel   No orders of the defined types were placed in this encounter.    Follow-Up Instructions: Return in about 3 months (around 10/22/2023) for Rheumatoid arthritis, Osteoarthritis, Osteoporosis.   Pollyann Savoy, MD  Note - This record has been created using Animal nutritionist.  Chart creation errors have been sought, but may not always  have been located. Such creation errors do not reflect on  the standard of medical care.

## 2023-07-25 NOTE — Telephone Encounter (Signed)
Pt called stating her hip is in pain and would like to know what she should do for this. I let pt know Dr. Reginia Forts first open spot is not till March 17th but pt does not want to wait that long with this pain. Pt will be in Monday the 24th for labs.

## 2023-07-25 NOTE — Patient Instructions (Addendum)
Standing Labs We placed an order today for your standing lab work.   Please have your standing labs drawn in March and every 3 months  Please have your labs drawn 2 weeks prior to your appointment so that the provider can discuss your lab results at your appointment, if possible.  Please note that you may see your imaging and lab results in MyChart before we have reviewed them. We will contact you once all results are reviewed. Please allow our office up to 72 hours to thoroughly review all of the results before contacting the office for clarification of your results.  WALK-IN LAB HOURS  Monday through Thursday from 8:00 am -12:30 pm and 1:00 pm-5:00 pm and Friday from 8:00 am-12:00 pm.  Patients with office visits requiring labs will be seen before walk-in labs.  You may encounter longer than normal wait times. Please allow additional time. Wait times may be shorter on  Monday and Thursday afternoons.  We do not book appointments for walk-in labs. We appreciate your patience and understanding with our staff.   Labs are drawn by [facility]. Please bring your co-pay at the time of your lab draw.  You may receive a bill from [facility] for your lab work.  Please note if you are on Hydroxychloroquine and and an order has been placed for a Hydroxychloroquine level,  you will need to have it drawn 4 hours or more after your last dose.  If you wish to have your labs drawn at another location, please call the office 24 hours in advance so we can fax the orders.  The office is located at [address], [place], [place] [address]   If you have any questions regarding directions or hours of operation,  please call [phone].   As a reminder, please drink plenty of water prior to coming for your lab work. Thanks!   Iliotibial Band Syndrome Rehab Ask your health care provider which exercises are safe for you. Do exercises exactly as told by your provider and adjust them as told. It's normal  to feel mild stretching, pulling, tightness, or discomfort as you do these exercises. Stop right away if you feel sudden pain or your pain gets a lot worse. Do not begin these exercises until told by your provider. Stretching and range-of-motion exercises These exercises warm up your muscles and joints. They also improve the movement and flexibility of your hip and pelvis. Quadriceps stretch, prone  Lie face down (prone) on a firm surface like a bed or padded floor. Bend your left / right knee. Reach back to hold your ankle or pant leg. If you can't reach your ankle or pant leg, use a belt looped around your foot and grab the belt instead. Gently pull your heel toward your butt. Your knee should not slide out to the side. You should feel a stretch in the front of your thigh and knee, also called the quadriceps. Hold this position for __________ seconds. Repeat __________ times. Complete this exercise __________ times a day. Iliotibial band stretch The iliotibial band is a strip of tissue that runs along the outside of your hip down to your knee. Lie on your side with your left / right leg on top. Bend both knees and grab your left / right ankle. Stretch out your bottom arm to help you balance. Slowly bring your top knee back so your thigh goes behind your back. Slowly lower your top leg toward the floor until you feel a gentle  stretch on the outside of your left / right hip and thigh. If you don't feel a stretch and your knee won't go farther, place the heel of your other foot on top of your knee and pull your knee down toward the floor with your foot. Hold this position for __________ seconds. Repeat __________ times. Complete this exercise __________ times a day. Strengthening exercises These exercises build strength and endurance in your hip and pelvis. Endurance means your muscles can keep working even when they're tired. Straight leg raises, side-lying This exercise strengthens the muscles  that rotate the leg at the hip and move it away from your body. These muscles are called hip abductors. Lie on your side with your left / right leg on top. Lie so your head, shoulder, hip, and knee line up. You can bend your bottom knee to help you balance. Roll your hips slightly forward so they're stacked directly over each other. Your left / right knee should face forward. Tense the muscles in your outer thigh and hip. Lift your top leg 4-6 inches (10-15 cm) off the ground. Hold this position for __________ seconds. Slowly lower your leg back down to the starting position. Let your muscles fully relax before doing this exercise again. Repeat __________ times. Complete this exercise __________ times a day. Leg raises, prone This exercise strengthens the muscles that move the hips backward. These muscles are called hip extensors. Lie face down (prone) on your bed or a firm surface. You can put a pillow under your hips for comfort and to support your lower back. Bend your left / right knee so your foot points straight up toward the ceiling. Keep the other leg straight and behind you. Squeeze your butt muscles. Lift your left / right thigh off the firm surface. Do not let your back arch. Tense your thigh muscle as hard as you can without having more knee pain. Hold this position for __________ seconds. Slowly lower your leg to the starting position. Allow your leg to relax all the way. Repeat __________ times. Complete this exercise __________ times a day. Hip hike  Stand sideways on a bottom step. Place your feet so that your left / right leg is on the step, and the other foot is hanging off the side. If you need support for balance, hold onto a railing or wall. Keep your knees straight and your abdomen square, meaning your hips are level. Then, lift your left / right hip up toward the ceiling. Slowly let your leg that's hanging off the step lower towards the floor. Your foot should get closer  to the ground. Do not lean or bend your knees during this movement. Repeat __________ times. Complete this exercise __________ times a day. This information is not intended to replace advice given to you by your health care provider. Make sure you discuss any questions you have with your health care provider. Document Revised: 08/02/2022 Document Reviewed: 08/02/2022 Elsevier Patient Education  2024 [employer].

## 2023-07-25 NOTE — ED Provider Notes (Signed)
[place] EMERGENCY DEPARTMENT AT [facility] Provider Note   CSN: [id number] Arrival date & time: 07/25/23  1123     History  Chief Complaint  Patient presents with   Hypertension    Felicia [name] is a 83 y.o. female history of hypertension, OSA, rheumatoid arthritis, CAD, pulmonary hypertension presented with hypertension and rheumatoid office today.  Patient is post to get a steroid injection for her left hip due to bursitis however had blood pressure readings of 191/196 and was told to go to the ED.  Patient states that she had a few moments of headache and dizziness that ultimately resolved but thinks this may be anxiety as she does have history of whitecoat syndrome and states is not unusual for blood pressure to get in the 180s when she goes to see the physician.  Patient dates her blood pressure at home normally runs in the 130s 140s and that she has been compliant with her hypertension medication.  Patient denies chest pain, vision changes, neck pain, shortness of breath, abdominal pain.    Home Medications Prior to Admission medications   Medication Sig Start Date End Date Taking? Authorizing Provider  Apoaequorin (PREVAGEN PO) Take 1 tablet by mouth daily.    [provider]  Artificial Tear Solution (GENTEAL TEARS OP) Apply to eye.    [provider]  Bempedoic Acid-Ezetimibe (NEXLIZET) 180-10 MG TABS TAKE 1 TABLET BY MOUTH DAILY 05/14/23   Tereso Newcomer T, PA-C  Carboxymethylcellulose Sodium (REFRESH OP) Apply 1 drop to eye daily as needed (FOR DRY EYES).    [provider]  erythromycin ophthalmic ointment 1 Application at bedtime.    [provider]  esomeprazole (NEXIUM) 40 MG capsule TAKE 1 CAPSULE BY MOUTH TWICE DAILY BEFORE MEALS 03/12/23   Iva Boop, MD  estradiol (ESTRACE) 1 MG tablet Take 1 mg by mouth daily. 02/24/15   [provider]  FeFum-FePoly-FA-B Cmp-C-Biot (INTEGRA PLUS) CAPS Take 1 capsule by mouth  daily. 05/20/23   Si Gaul, MD  folic acid (FOLVITE) 1 MG tablet TAKE 2 TABLETS(2 MG) BY MOUTH DAILY 01/31/23   Gearldine Bienenstock, PA-C  gabapentin (NEURONTIN) 100 MG capsule Take 4 capsules (400 mg total) by mouth at bedtime. 04/03/23   Antony Madura, MD  isosorbide mononitrate (IMDUR) 30 MG 24 hr tablet TAKE 1 TABLET BY MOUTH EVERY DAY 04/18/23   Chandrasekhar, Mahesh A, MD  latanoprost (XALATAN) 0.005 % ophthalmic solution Place 1 drop into both eyes at bedtime.    [provider]  losartan (COZAAR) 100 MG tablet Take 100 mg by mouth daily.    [provider]  methotrexate (RHEUMATREX) 2.5 MG tablet TAKE 4 TABLETS BY MOUTH 1 TIME A WEEK 05/22/23   Gearldine Bienenstock, PA-C  metoprolol tartrate (LOPRESSOR) 25 MG tablet TAKE 1 TABLET(25 MG) BY MOUTH TWICE DAILY 12/30/22   Tereso Newcomer T, PA-C  metroNIDAZOLE (METROGEL) 0.75 % gel Apply 1 application topically 2 (two) times daily.  07/09/18   [provider]  Multiple Vitamins-Minerals (CENTRUM SILVER PO) Take 1 capsule by mouth daily.    [provider]  Polyethylene Glycol 3350 (MIRALAX PO) Take 1 Dose by mouth as needed.    [provider]  progesterone (PROMETRIUM) 200 MG capsule Take 200 mg by mouth daily. 06/17/22   [provider]  RINVOQ 15 MG TB24 TAKE 1 TABLET BY MOUTH EVERY DAY 07/07/23   Pollyann Savoy, MD  valACYclovir (VALTREX) 1000 MG tablet Take  1,000 mg by mouth as needed (fever blister). Reported on 06/14/2015    [provider]  Wheat Dextrin (BENEFIBER DRINK MIX PO) Take 1 Scoop by mouth at bedtime.    [provider]  Zoledronic Acid (RECLAST IV) Inject into the vein once. Once a year Patient not taking: Reported on 07/25/2023    [provider]      Allergies    Sulfonamide derivatives, Amlodipine, and Norvasc [amlodipine besylate]    Review of Systems   Review of Systems  Physical Exam Updated Vital Signs BP (!) 174/83   Pulse 67   Temp  97.6 F (36.4 C) (Oral)   Resp 18   SpO2 100%  Physical Exam Vitals reviewed.  Constitutional:      General: She is not in acute distress. HENT:     Head: Normocephalic and atraumatic.  Eyes:     Extraocular Movements: Extraocular movements intact.     Conjunctiva/sclera: Conjunctivae normal.     Pupils: Pupils are equal, round, and reactive to light.  Cardiovascular:     Rate and Rhythm: Normal rate and regular rhythm.     Pulses: Normal pulses.     Heart sounds: Normal heart sounds.     Comments: 2+ bilateral radial/dorsalis pedis pulses with regular rate Pulmonary:     Effort: Pulmonary effort is normal. No respiratory distress.     Breath sounds: Normal breath sounds.  Abdominal:     Palpations: Abdomen is soft.     Tenderness: There is no abdominal tenderness. There is no guarding or rebound.  Musculoskeletal:        General: Normal range of motion.     Cervical back: Normal range of motion and neck supple.     Comments: 5 out of 5 bilateral grip/leg extension strength  Skin:    General: Skin is warm and dry.     Capillary Refill: Capillary refill takes less than 2 seconds.  Neurological:     General: No focal deficit present.     Mental Status: She is alert and oriented to person, place, and time.     Sensory: Sensation is intact.     Motor: Motor function is intact.     Coordination: Coordination is intact.     Gait: Gait is intact.     Comments: Sensation intact in all 4 limbs Cranial nerves III through XII intact Patient does have history of glaucoma however her vision is at her baseline reported by her  Psychiatric:        Mood and Affect: Mood normal.     ED Results / Procedures / Treatments   Labs (all labs ordered are listed, but only abnormal results are displayed) Labs Reviewed  CBC - Abnormal; Notable for the following components:      Result Value   RBC 3.44 (*)    Hemoglobin 11.2 (*)    HCT 33.1 (*)    Platelets 433 (*)    All other  components within normal limits  BASIC METABOLIC PANEL - Abnormal; Notable for the following components:   CO2 21 (*)    All other components within normal limits    EKG None  Radiology No results found.  Procedures Procedures    Medications Ordered in ED Medications  acetaminophen (TYLENOL) tablet 1,000 mg (1,000 mg Oral Given 07/25/23 1222)    ED Course/ Medical Decision Making/ A&P  Medical Decision Making Amount and/or Complexity of Data Reviewed Labs: ordered.  Risk OTC drugs.   RENIYAH GOOTEE 83 y.o. presented today for elevated HTN. Working DDx that I considered at this time includes, but not limited to, HTN urgency/emergency, intracranial hemorrhage, acute renal artery stenosis, acute kidney injury, ACS, ophthalmologic emergencies.  R/o DDx: HTN urgency/emergency, intracranial hemorrhage, acute renal artery stenosis, acute kidney injury, ACS, ophthalmologic emergencies: These are considered less likely due to history of present illness, physical exam, labs/imaging findings  Review of prior external notes: 07/25/2023 office visit  Unique Tests and My Independent Interpretation:  CBC: Unremarkable BMP: Unremarkable  Social Determinants of Health: none  Discussion with Independent Historian:  Friend  Discussion of Management of Tests: None  Risk: Low: based on diagnostic testing/clinical impression and treatment plan  Risk Stratification Score: None  Plan: On exam patient was in no acute distress. Physical exam showed no acute findings.  Patient's labs in triage were ultimately reassuring.  Patient did endorse some dizziness at 1 point and so I offered a CT scan however patient states he feels much better and would like to be discharged.  Patient blood pressure here is 174 and patient is currently asymptomatic and has been asymptomatic since leaving the office.  Patient does not want to stay for further workup and asked to be  discharged.  Patient given extremely strict return precautions and encouraged to follow-up closely with her primary care provider and to monitor blood pressure daily.  Patient does have history of whitecoat syndrome reported by her however given her significant medical history I spoke to the patient we will do a larger workup however she states she does not want to stay.  This was witnessed by her friend in the room.  Patient was given return precautions. Patient stable for discharge at this time.  Patient verbalized understanding of plan.  This chart was dictated using voice recognition software.  Despite best efforts to proofread,  errors can occur which can change the documentation meaning.         Final Clinical Impression(s) / ED Diagnoses Final diagnoses:  Uncontrolled hypertension    Rx / DC Orders ED Discharge Orders     None         Remi Deter 07/25/23 1434    Tegeler, Canary Brim, MD 07/25/23 [phone]

## 2023-07-25 NOTE — Discharge Instructions (Signed)
Please follow-up closely with your primary care provider regards resumes ER visit.  Today your labs are reassuring and after discussion you decided to decline the CT scan of the head.  Please take your blood pressure meds as prescribed follow-up with your primary care provider.  In the meantime please take your blood pressure daily.  In regards to your hip pain you may try an [facility] urgent care to see if they can help you with a steroid injection.  If symptoms change or worsen please return to the ER.

## 2023-07-29 DIAGNOSIS — I129 Hypertensive chronic kidney disease with stage 1 through stage 4 chronic kidney disease, or unspecified chronic kidney disease: Secondary | ICD-10-CM | POA: Diagnosis not present

## 2023-08-11 ENCOUNTER — Telehealth: Payer: Self-pay | Admitting: Internal Medicine

## 2023-08-11 ENCOUNTER — Other Ambulatory Visit: Payer: Self-pay

## 2023-08-11 DIAGNOSIS — Z79899 Other long term (current) drug therapy: Secondary | ICD-10-CM | POA: Diagnosis not present

## 2023-08-11 DIAGNOSIS — Z8639 Personal history of other endocrine, nutritional and metabolic disease: Secondary | ICD-10-CM

## 2023-08-11 DIAGNOSIS — I10 Essential (primary) hypertension: Secondary | ICD-10-CM

## 2023-08-11 NOTE — Telephone Encounter (Signed)
 Pt c/o BP issue: STAT if pt c/o blurred vision, one-sided weakness or slurred speech.  STAT if BP is GREATER than 180/120 TODAY.  STAT if BP is LESS than 90/60 and SYMPTOMATIC TODAY  1. What is your BP concern? Pt concerned that her BP and HR have been inconsistent. She states her PCP gave her new medication (Clonidine 0.1mg  one tablet 2x daily). She states she is still taking Losartan 100mg  daily and metoprolol twice 25mg  daily. She expressed concern about her med list and making sure that she is taking the right meds.  She mentioned she was in the ED 07/25/23 with the same issue bp was 191/196.   2. Have you taken any BP medication today? Yes   3. What are your last 5 BP readings?  3/7: 101/57 hr55  3/8: 120/47 hr61 3/9: 114/48 hr53  3/10: 167/71 hr65            144/68 hr61 - 2 mins later   4. Are you having any other symptoms (ex. Dizziness, headache, blurred vision, passed out)? She states she is concerned about her vision due to her having glaucoma. She states her PCP told her the new med may lower her eye pressure and her eye pressure needs to stay below 10. She states her eyes have felt funny but denies any blurriness. She also denies dizziness, headache, or passing out.

## 2023-08-11 NOTE — Telephone Encounter (Signed)
 Called pt regarding BP readings.  Reports PCP started on clonidine 0.1 mg PO BID about 2 weeks ago.  Since this time BP is up and down and HR ranges 48-low 60's.  HR prior was in 70's.  Has fatigue and dry throat since starting clonidine.   Pt denies increased salt intake or pain that could possibly explain elevated BP readings today.  Does have what she describes as a low grade HA. Advised pt to continue to monitor BP 1.5-2 hours after taking BP meds.  Advised we don't typically change BP meds d/t 1 high reading.  Advised that change in HR is concerning. Will send a message to our pharmacist to see if addition of clonidine with meds pt is already on could lower HR .  Advised pt of ED precautions regarding BP.

## 2023-08-12 LAB — HEPATIC FUNCTION PANEL
AG Ratio: 2 (calc) (ref 1.0–2.5)
ALT: 22 U/L (ref 6–29)
AST: 27 U/L (ref 10–35)
Albumin: 4.4 g/dL (ref 3.6–5.1)
Alkaline phosphatase (APISO): 36 U/L — ABNORMAL LOW (ref 37–153)
Bilirubin, Direct: 0.1 mg/dL (ref 0.0–0.2)
Globulin: 2.2 g/dL (ref 1.9–3.7)
Indirect Bilirubin: 0.4 mg/dL (ref 0.2–1.2)
Total Bilirubin: 0.5 mg/dL (ref 0.2–1.2)
Total Protein: 6.6 g/dL (ref 6.1–8.1)

## 2023-08-12 LAB — LIPID PANEL
Cholesterol: 138 mg/dL (ref <200)
HDL: 63 mg/dL (ref >=50)
LDL Cholesterol (Calc): 58 mg/dL
Non-HDL Cholesterol (Calc): 75 mg/dL (ref <130)
Total CHOL/HDL Ratio: 2.2 (calc) (ref <5.0)
Triglycerides: 89 mg/dL (ref <150)

## 2023-08-12 NOTE — Telephone Encounter (Signed)
 I spoke to patient, she reports she feels extremely tired and have sever dry mouth and dry eyes. Her BP ~ 130-135/50-60 range with some low readings 114/44 heart rate high 40's. Patient is experiencing clonidine side effects. Will reduce clonidine dose to 0.05 mg twice daily for now and follow up on Monday March 17,2025. Pt was very appreciative of the phone call.    Future plan is to initiate some of the 1st line antihypertensive and take her off of clonidine

## 2023-08-12 NOTE — Progress Notes (Signed)
 LFTs normal, lipid panel normal.

## 2023-08-15 DIAGNOSIS — N3941 Urge incontinence: Secondary | ICD-10-CM | POA: Diagnosis not present

## 2023-08-15 DIAGNOSIS — R8279 Other abnormal findings on microbiological examination of urine: Secondary | ICD-10-CM | POA: Diagnosis not present

## 2023-08-15 DIAGNOSIS — R350 Frequency of micturition: Secondary | ICD-10-CM | POA: Diagnosis not present

## 2023-08-17 DIAGNOSIS — G4733 Obstructive sleep apnea (adult) (pediatric): Secondary | ICD-10-CM | POA: Diagnosis not present

## 2023-08-18 NOTE — Telephone Encounter (Signed)
  Spoke to patient reports her BP is under control but she still get tired around 10 and 11 am. She takes gabapentin at night for neuropathy wakes up multiple time during night for due to urine urgency. Dry mouth has improved but not whole lot.  144 68  111 44  126 53  124 67  157 68  141 61  134 59  113 52  119 55  112 45  142 57  127.9 [phone]  Advised patient to check BP only once a day unless feeling unwell check again. BP monitor is never validated and BP measurement techniques never validated so will follow up in 2 weeks via phone and schedule in person visit for BP management with PharmD on 09/19/2023

## 2023-08-18 NOTE — Progress Notes (Signed)
 Office Visit Note  Patient: Felicia [name]             Date of Birth: 11-30-1940           MRN: [id number]             PCP: Sigmund Hazel, MD Referring: Sigmund Hazel, MD Visit Date: 09/01/2023 Occupation: @GUAROCC @  Subjective:  Pain in both hips   History of Present Illness: Felicia [name] is a 83 y.o. female with history of seropositive rheumatoid arthritis and osteoarthritis.  Patient remains on  Rinvoq 15 mg 1 tablet by mouth daily, Methotrexate 4 tablets by mouth every 7 days, and folic acid 1 mg 2 tablets daily.   Patient experiences nausea the day of and day after taking methotrexate.  Patient states that some weeks she dreads having to take methotrexate due to these side effects.  She denies any signs of a rheumatoid arthritis flare but states that she continues to experience intermittent aching and joint stiffness particularly during the cooler weather months.  She denies any joint swelling.  Her discomfort has been most severe in both hips and both knee joints.  She takes Tylenol as needed for pain relief.  She remains on gabapentin at bedtime.  She has been experiencing more frequent muscle cramping especially at night.   Activities of Daily Living:  Patient reports morning stiffness for 30-60 minutes.   Patient Reports nocturnal pain.  Difficulty dressing/grooming: Reports Difficulty climbing stairs: Reports Difficulty getting out of chair: Reports Difficulty using hands for taps, buttons, cutlery, and/or writing: Reports  Review of Systems  Constitutional:  Positive for fatigue.  HENT:  Positive for mouth dryness and nose dryness. Negative for mouth sores.   Eyes:  Positive for pain and dryness.  Respiratory:  Positive for shortness of breath. Negative for difficulty breathing.   Cardiovascular:  Negative for chest pain and palpitations.  Gastrointestinal:  Positive for constipation. Negative for blood in stool and diarrhea.  Endocrine: Negative for increased urination.   Genitourinary:  Positive for hematuria. Negative for involuntary urination.  Musculoskeletal:  Positive for joint pain, gait problem, joint pain, joint swelling, myalgias, muscle weakness, morning stiffness, muscle tenderness and myalgias.  Skin:  Positive for color change and sensitivity to sunlight. Negative for rash and hair loss.  Allergic/Immunologic: Negative for susceptible to infections.  Neurological:  Negative for dizziness and headaches.  Hematological:  Negative for swollen glands.  Psychiatric/Behavioral:  Positive for sleep disturbance. Negative for depressed mood. The patient is not nervous/anxious.     PMFS History:  Patient Active Problem List   Diagnosis Date Noted   Right-sided low back pain without sciatica 06/21/2022   Coronary artery disease involving native coronary artery with angina pectoris (HCC) 04/08/2022   Pulmonary HTN (HCC) 04/08/2022   Shortness of breath 04/08/2022   Greater trochanteric pain syndrome of right lower extremity 10/23/2021   Age-related osteoporosis without current pathological fracture 06/26/2021   Aplastic anemia (HCC) 06/26/2021   Hiatal hernia 06/26/2021   Prediabetes 06/26/2021   Neuropathy 06/16/2020   SIADH (syndrome of inappropriate ADH production) (HCC) 02/18/2020   Hyponatremia 11/18/2019   Thrombocytosis 08/01/2018   URI (upper respiratory infection) 04/14/2017   Primary osteoarthritis of both knees 03/06/2017   Exertional chest pain    Pain in the chest 03/27/2015   Postural hypotension 05/26/2013   OSA (obstructive sleep apnea) 03/22/2013   Lens replaced by other means 08/25/2012   Low tension glaucoma 02/14/2012   Osteoarthritis 11/07/2011  Abdominal bruit 10/16/2011   Essential hypertension 08/13/2011   Anisocoria 08/13/2011   Cervical radiculopathy 07/17/2011   DDD (degenerative disc disease), cervical 07/10/2011   Glaucoma 06/06/2011   HIATAL HERNIA 01/30/2010   POSTMENOPAUSAL SYNDROME 12/14/2009   EUSTACHIAN  TUBE DYSFUNCTION, RIGHT 06/28/2009   GERD 12/07/2008   Rheumatoid arthritis (HCC) 12/07/2008   History of colonic polyps 12/07/2008   Hyperlipidemia LDL goal <70 10/17/2006   HYPERCALCEMIA 10/17/2006   Mitral valve disease 10/17/2006    Past Medical History:  Diagnosis Date   Adenomatous colon polyp 2004   Colo in 2009 "no polyps"   Complication of anesthesia    small airway per patient   Concussion    Echocardiogram    Echo 8/22 EF 60-65, no RWMA, mild LVH, normal RVSF, moderately elevated PASP (RVSP 50.9), trivial MR, mild AI, AV sclerosis without stenosis   GERD (gastroesophageal reflux disease)    Glaucoma    Hiatal hernia    Hypertension    Neuropathy    Nuclear stress test    Myoview 8/22: EF 65, no ischemia; low risk   Rheumatoid arthritis(714.0)    Orencia; Methotrexate (Dr. Titus Dubin)   Sleep apnea    on CPAP    Family History  Problem Relation Age of Onset   Dementia Mother    Lung disease Mother        bronchiectasis   Heart disease Mother        Aortic Stenosis   Osteoporosis Mother    Heart attack Father 56       S/P CBAG   Ulcerative colitis Brother    Stroke Maternal Grandmother 65   Diabetes Neg Hx    Cancer Neg Hx    Colon cancer Neg Hx    Esophageal cancer Neg Hx    Stomach cancer Neg Hx    Pancreatic cancer Neg Hx    Liver disease Neg Hx    Past Surgical History:  Procedure Laterality Date   CARDIAC CATHETERIZATION N/A 03/29/2015   Procedure: Left Heart Cath and Coronary Angiography;  Surgeon: Lyn Records, MD;  Location: [facility];  Service: Cardiovascular;  Laterality: N/A;   CATARACT EXTRACTION, BILATERAL     COLONOSCOPY W/ POLYPECTOMY     X1; negative subsequently   DILATION AND CURETTAGE OF UTERUS     EYE SURGERY     due to RA   EYE SURGERY Left 06/2017   eyelid tendon    HYSTEROSCOPY WITH D & C  06/24/2012   Procedure: DILATATION AND CURETTAGE /HYSTEROSCOPY;  Surgeon: Loney Laurence, MD;  Location: [facility];  Service:  Gynecology;  Laterality: N/A;   SHOULDER SURGERY Left    due to RA   SQUAMOUS CELL CARCINOMA EXCISION     scalp and left leg   TRABECULECTOMY     X 2 OD; X 1 OS   UPPER GASTROINTESTINAL ENDOSCOPY     hiatal hernia   Social History   Social History Narrative   Single   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 Left to write and right with every thing else    Are you currently employed ?    What is your current occupation?retire   Do you live at home alone? no   Who lives with you? Family friend   What type of home do you live in: 1 story or 2 story? one  Immunization History  Administered Date(s) Administered   Influenza Split 03/08/2014   Influenza Whole 04/07/2007, 02/24/2008, 03/10/2009, 03/03/2012   Influenza, High Dose Seasonal PF 01/15/2016, 03/07/2017, 01/27/2019   Influenza,inj,Quad PF,6+ Mos 03/15/2013, 02/21/2015, 03/17/2018   Influenza-Unspecified 03/01/2021   PFIZER(Purple Top)SARS-COV-2 Vaccination 06/24/2019, 07/13/2019, 01/15/2020, 06/26/2020   PPD Test 03/10/2012   Pneumococcal Conjugate-13 09/12/2015   Pneumococcal Polysaccharide-23 03/25/2003, 04/12/2008   Td 12/14/2009   Zoster Recombinant(Shingrix) 03/23/2018     Objective: Vital Signs: BP 112/63 (BP Location: Left Arm, Patient Position: Sitting, Cuff Size: Normal)   Pulse 66   Resp 13   Ht 5\' 5"  (1.651 m)   Wt 135 lb 3.2 oz (61.3 kg)   BMI 22.50 kg/m    Physical Exam Vitals and nursing note reviewed.  Constitutional:      Appearance: She is well-developed.  HENT:     Head: Normocephalic and atraumatic.  Eyes:     Conjunctiva/sclera: Conjunctivae normal.  Cardiovascular:     Rate and Rhythm: Normal rate and regular rhythm.     Heart sounds: Normal heart sounds.  Pulmonary:     Effort: Pulmonary effort is normal.     Breath sounds: Normal breath sounds.  Abdominal:     General: Bowel sounds are normal.     Palpations:  Abdomen is soft.  Musculoskeletal:     Cervical back: Normal range of motion.  Lymphadenopathy:     Cervical: No cervical adenopathy.  Skin:    General: Skin is warm and dry.     Capillary Refill: Capillary refill takes less than 2 seconds.  Neurological:     Mental Status: She is alert and oriented to person, place, and time.  Psychiatric:        Behavior: Behavior normal.      Musculoskeletal Exam: C-spine has limited range of motion with lateral rotation.  Shoulder joints, elbow joints, wrist joints MCPs, PIPs, DIPs have good range of motion with no synovitis.  CMC joint prominence noted bilaterally.  PIP and DIP thickening.  Hip joints have good range of motion with no groin pain.  Tenderness over the left trochanteric bursa.  Knee joints have good range of motion with no warmth or effusion.  Ankle joints have good range of motion with no tenderness or joint swelling.  CDAI Exam: CDAI Score: -- Patient Global: --; Provider Global: -- Swollen: --; Tender: -- Joint Exam 09/01/2023   No joint exam has been documented for this visit   There is currently no information documented on the homunculus. Go to the Rheumatology activity and complete the homunculus joint exam.  Investigation: No additional findings.  Imaging: DG Ankle Complete Left Result Date: 08/22/2023 CLINICAL DATA:  Ankle pain. EXAM: LEFT ANKLE COMPLETE - 3+ VIEW COMPARISON:  Left foot radiographs 12/21/2019 FINDINGS: The ankle mortise is symmetric and intact. Mild diffuse calf and ankle soft tissue swelling and edema. Minimal talonavicular joint space narrowing. No acute fracture or dislocation. No cortical erosion. IMPRESSION: Mild diffuse calf and ankle soft tissue swelling and edema. No acute fracture. Electronically Signed   By: Neita Garnet M.D.   On: 08/22/2023 10:02   DG Ankle Complete Right Result Date: 08/22/2023 CLINICAL DATA:  Right ankle pain. EXAM: RIGHT ANKLE - COMPLETE 3+ VIEW COMPARISON:  Right foot  radiographs 12/21/2019 FINDINGS: The ankle mortise is symmetric and intact. Minimal talonavicular joint space narrowing. Mild lateral greater than medial ankle soft tissue swelling. This may be systemic, as there appears to be diffuse soft tissue swelling  and edema throughout the calf. No acute fracture or dislocation. No cortical erosion. IMPRESSION: Mild lateral greater than medial ankle soft tissue swelling. This may be systemic, as there appears to be diffuse soft tissue swelling and edema throughout the calf. No significant osteoarthritis. Electronically Signed   By: Neita Garnet M.D.   On: 08/22/2023 10:01    Recent Labs: Lab Results  Component Value Date   WBC 5.7 08/20/2023   HGB 10.4 (L) 08/20/2023   PLT 384 08/20/2023   NA 135 07/25/2023   K 4.1 07/25/2023   CL 106 07/25/2023   CO2 21 (L) 07/25/2023   GLUCOSE 97 07/25/2023   BUN 14 07/25/2023   CREATININE 0.86 07/25/2023   BILITOT 0.5 08/11/2023   ALKPHOS 37 (L) 05/20/2023   AST 27 08/11/2023   ALT 22 08/11/2023   PROT 6.6 08/11/2023   ALBUMIN 4.4 05/20/2023   CALCIUM 9.6 07/25/2023   GFRAA 76 10/02/2020   QFTBGOLDPLUS NEGATIVE 12/23/2022    Speciality Comments: TB Gold: 01/22/2022 Neg  Reclast first infusion was given 08/10/2019, 09/18/2020, 11/06/21. Drug holiday x 2 years Rinvoq started September 06, 2020  Procedures:  No procedures performed Allergies: Sulfonamide derivatives, Amlodipine, and Norvasc [amlodipine besylate]     Assessment / Plan:     Visit Diagnoses: Rheumatoid arthritis involving multiple sites with positive rheumatoid factor (HCC) - +RF, +ANA: She has no synovitis on examination today.  She has not had any signs or symptoms of a rheumatoid arthritis flare.  She has clinically been doing well taking Rinvoq 15 mg 1 tablet by mouth daily and methotrexate 4 tablets by mouth once weekly.  She has been experiencing nausea the day of and day after taking methotrexate on a weekly basis to the point that she is  dreading taking the weekly dose of methotrexate.  Plan to try reducing methotrexate to 3 tablets by mouth once weekly.  She does not want to switch to injectable methotrexate.  She will notify us if she develops any new or worsening symptoms on the reduced dose of methotrexate.  She will remain on Rinvoq as prescribed.  She was advised to notify us if she develops signs or symptoms of a flare.  High risk medication use - Rinvoq 15 mg 1 tablet by mouth daily, Methotrexate 3 tablets by mouth every 7 days, and folic acid 1 mg 2 tablets daily. CBC, lipid panel, and hepatic function panel updated on 08/11/23. Her next lab work will be due in June and every 3 months.  TB gold negative on 12/23/22.  Discussed the importance of holding rinvoq and methotrexate if she develops signs or symptoms of an infection and to resume once the infection has completely cleared.   - Plan: CBC with Differential/Platelet, Comprehensive metabolic panel with GFR, QuantiFERON-TB Gold Plus  Screening for tuberculosis -Future order for TB gold placed today.  Plan: QuantiFERON-TB Gold Plus  Chronic anemia: Under care of Dr. Scarlette Ar lab work from 08/20/23: Red blood cell count 3.20, hemoglobin 10.4, hematocrit 30.6  Thrombocytosis: Plt count [id number] on 08/20/23.   Primary osteoarthritis of both hands: CMC, PIP, DIP thickening consistent with osteoarthritis of both hands. No inflammation noted.   Trochanteric bursitis, left hip: She had a left trochanteric bursa cortisone injection performed on 08/21/2023 but her symptoms have persisted.  A referral to physical therapy will be placed today.  Primary osteoarthritis of both knees: Patient continues to experience intermittent discomfort and stiffness in both knee joints.  She has good range of motion  of both knees with no warmth or effusion today.  DDD (degenerative disc disease), cervical: Slightly limited ROM-no symptoms of radiculopathy.   Spondylosis of lumbar spine: No  symptoms of radiculopathy.   Age-related osteoporosis without current pathological fracture - 12/03/2021 T-score -2.1, BMD 0.571 in the right one third radius.  Reclast infusions in 2021, 2022 and June 2023.  She is on a drug holiday.  Plan to update DEXA in July 2025--order placed today- Plan: DG BONE DENSITY (DXA)  Other medical conditions are listed as follows:  History of vitamin D deficiency  History of squamous cell carcinoma  SIADH (syndrome of inappropriate ADH production) (HCC)  History of glaucoma  History of sleep apnea  Neuropathy  Essential hypertension: Blood pressure was 112/63 today in the office.  Coronary artery disease involving native coronary artery of native heart without angina pectoris  History of colonic polyps  History of gastroesophageal reflux (GERD)    Orders: Orders Placed This Encounter  Procedures   DG BONE DENSITY (DXA)   CBC with Differential/Platelet   Comprehensive metabolic panel with GFR   QuantiFERON-TB Gold Plus   No orders of the defined types were placed in this encounter.   Follow-Up Instructions: Return in about 5 months (around 02/01/2024) for Rheumatoid arthritis.   Gearldine Bienenstock, PA-C  Note - This record has been created using Dragon software.  Chart creation errors have been sought, but may not always  have been located. Such creation errors do not reflect on  the standard of medical care.

## 2023-08-19 ENCOUNTER — Ambulatory Visit: Payer: Medicare PPO | Admitting: Internal Medicine

## 2023-08-19 ENCOUNTER — Other Ambulatory Visit: Payer: Medicare PPO

## 2023-08-19 DIAGNOSIS — Z1231 Encounter for screening mammogram for malignant neoplasm of breast: Secondary | ICD-10-CM | POA: Diagnosis not present

## 2023-08-20 ENCOUNTER — Inpatient Hospital Stay: Payer: Medicare PPO | Admitting: Internal Medicine

## 2023-08-20 ENCOUNTER — Inpatient Hospital Stay: Payer: Medicare PPO | Attending: Internal Medicine

## 2023-08-20 VITALS — BP 158/61 | HR 61 | Temp 97.4°F | Resp 15 | Ht 65.0 in | Wt 140.8 lb

## 2023-08-20 DIAGNOSIS — D539 Nutritional anemia, unspecified: Secondary | ICD-10-CM

## 2023-08-20 DIAGNOSIS — D649 Anemia, unspecified: Secondary | ICD-10-CM | POA: Insufficient documentation

## 2023-08-20 DIAGNOSIS — R809 Proteinuria, unspecified: Secondary | ICD-10-CM | POA: Diagnosis not present

## 2023-08-20 DIAGNOSIS — R3915 Urgency of urination: Secondary | ICD-10-CM | POA: Insufficient documentation

## 2023-08-20 DIAGNOSIS — M25471 Effusion, right ankle: Secondary | ICD-10-CM | POA: Diagnosis not present

## 2023-08-20 DIAGNOSIS — M25472 Effusion, left ankle: Secondary | ICD-10-CM | POA: Insufficient documentation

## 2023-08-20 DIAGNOSIS — I10 Essential (primary) hypertension: Secondary | ICD-10-CM | POA: Diagnosis not present

## 2023-08-20 DIAGNOSIS — R350 Frequency of micturition: Secondary | ICD-10-CM | POA: Diagnosis not present

## 2023-08-20 DIAGNOSIS — D75838 Other thrombocytosis: Secondary | ICD-10-CM | POA: Insufficient documentation

## 2023-08-20 DIAGNOSIS — M069 Rheumatoid arthritis, unspecified: Secondary | ICD-10-CM | POA: Insufficient documentation

## 2023-08-20 DIAGNOSIS — D75839 Thrombocytosis, unspecified: Secondary | ICD-10-CM

## 2023-08-20 LAB — CBC WITH DIFFERENTIAL (CANCER CENTER ONLY)
Abs Immature Granulocytes: 0.03 10*3/uL (ref 0.00–0.07)
Basophils Absolute: 0 10*3/uL (ref 0.0–0.1)
Basophils Relative: 0 %
Eosinophils Absolute: 0.1 10*3/uL (ref 0.0–0.5)
Eosinophils Relative: 1 %
HCT: 30.6 % — ABNORMAL LOW (ref 36.0–46.0)
Hemoglobin: 10.4 g/dL — ABNORMAL LOW (ref 12.0–15.0)
Immature Granulocytes: 1 %
Lymphocytes Relative: 31 %
Lymphs Abs: 1.8 10*3/uL (ref 0.7–4.0)
MCH: 32.5 pg (ref 26.0–34.0)
MCHC: 34 g/dL (ref 30.0–36.0)
MCV: 95.6 fL (ref 80.0–100.0)
Monocytes Absolute: 0.6 10*3/uL (ref 0.1–1.0)
Monocytes Relative: 10 %
Neutro Abs: 3.2 10*3/uL (ref 1.7–7.7)
Neutrophils Relative %: 57 %
Platelet Count: 384 10*3/uL (ref 150–400)
RBC: 3.2 MIL/uL — ABNORMAL LOW (ref 3.87–5.11)
RDW: 13.2 % (ref 11.5–15.5)
WBC Count: 5.7 10*3/uL (ref 4.0–10.5)
nRBC: 0 % (ref 0.0–0.2)

## 2023-08-20 LAB — IRON AND IRON BINDING CAPACITY (CC-WL,HP ONLY)
Iron: 108 ug/dL (ref 28–170)
Saturation Ratios: 27 % (ref 10.4–31.8)
TIBC: 396 ug/dL (ref 250–450)
UIBC: 288 ug/dL (ref 148–442)

## 2023-08-20 LAB — FERRITIN: Ferritin: 160 ng/mL (ref 11–307)

## 2023-08-20 NOTE — Progress Notes (Signed)
 [facility] [phone]   [phone]  OFFICE PROGRESS NOTE  Sigmund Hazel, MD [address] [place] [address]  DIAGNOSIS:  1) Chronic anemia likely multifactorial, including iron deficiency and chronic disease due to rheumatoid arthritis.  2) Reactive Thrombocytosis; Elevated platelet counts likely secondary to iron deficiency anemia.  PRIOR THERAPY:   CURRENT THERAPY: Integra +1 capsule p.o. daily  INTERVAL HISTORY: Felicia [name] 83 y.o. female returns to the clinic today for 56-month follow-up visit.Discussed the use of AI scribe software for clinical note transcription with the patient, who gave verbal consent to proceed.  History of Present Illness   The patient is an 83 year old with chronic anemia and elevated platelet count who presents for follow-up.  She has a history of chronic anemia, suspected to be anemia of chronic disease or possibly due to blood loss. She was started on Integra plus a few months ago and reports improvement in symptoms, specifically noting a reduction in the 'sinking weak feeling'. However, she has experienced constipation and changes in stool color, which she attributes to the iron content in Integra plus. Her hemoglobin remains low at 10.4, consistent with previous levels. She has had anemia since her teenage years.  She also reports a history of elevated platelet count, which was thought to be reactive to iron deficiency. Since starting Integra plus, her platelet count has improved.  She has recently experienced high blood pressure, with readings as high as 199/unknown. She attributes this partly to pain from rheumatoid arthritis. She adjusted her clonidine dosage to half a tablet in the morning and half at night, which has resulted in improved blood pressure control.  She has a history of blood in her urine, noted by her gynecologist, but subsequent tests showed no blood or bacteria. A 24-hour urine  collection revealed high protein levels. She was referred to a urologist who performed imaging studies and found no significant issues with her kidneys or bladder. She experiences urinary urgency and frequency and was prescribed Gemtesa, with a follow-up planned in six weeks.  She mentions swelling around her ankles, which has improved slightly.       MEDICAL HISTORY: Past Medical History:  Diagnosis Date   Adenomatous colon polyp 2004   Colo in 2009 "no polyps"   Complication of anesthesia    small airway per patient   Concussion    Echocardiogram    Echo 8/22 EF 60-65, no RWMA, mild LVH, normal RVSF, moderately elevated PASP (RVSP 50.9), trivial MR, mild AI, AV sclerosis without stenosis   GERD (gastroesophageal reflux disease)    Glaucoma    Hiatal hernia    Hypertension    Neuropathy    Nuclear stress test    Myoview 8/22: EF 65, no ischemia; low risk   Rheumatoid arthritis(714.0)    Orencia; Methotrexate (Dr. Titus Dubin)   Sleep apnea    on CPAP    ALLERGIES:  is allergic to sulfonamide derivatives, amlodipine, and norvasc [amlodipine besylate].  MEDICATIONS:  Current Outpatient Medications  Medication Sig Dispense Refill   Apoaequorin (PREVAGEN PO) Take 1 tablet by mouth daily.     Artificial Tear Solution (GENTEAL TEARS OP) Apply to eye.     Bempedoic Acid-Ezetimibe (NEXLIZET) 180-10 MG TABS TAKE 1 TABLET BY MOUTH DAILY 90 tablet 3   Carboxymethylcellulose Sodium (REFRESH OP) Apply 1 drop to eye daily as needed (FOR DRY EYES).     cloNIDine (CATAPRES) 0.1 MG tablet Take 0.5  tablets (0.05 mg total) by mouth 2 (two) times daily.     erythromycin ophthalmic ointment 1 Application at bedtime.     esomeprazole (NEXIUM) 40 MG capsule TAKE 1 CAPSULE BY MOUTH TWICE DAILY BEFORE MEALS 180 capsule 0   estradiol (ESTRACE) 1 MG tablet Take 1 mg by mouth daily.  0   FeFum-FePoly-FA-B Cmp-C-Biot (INTEGRA PLUS) CAPS Take 1 capsule by mouth daily. 30 capsule 3   folic acid (FOLVITE)  1 MG tablet TAKE 2 TABLETS(2 MG) BY MOUTH DAILY 180 tablet 3   gabapentin (NEURONTIN) 100 MG capsule Take 4 capsules (400 mg total) by mouth at bedtime. 360 capsule 3   isosorbide mononitrate (IMDUR) 30 MG 24 hr tablet TAKE 1 TABLET BY MOUTH EVERY DAY 90 tablet 3   latanoprost (XALATAN) 0.005 % ophthalmic solution Place 1 drop into both eyes at bedtime.     losartan (COZAAR) 100 MG tablet Take 100 mg by mouth daily.     methotrexate (RHEUMATREX) 2.5 MG tablet TAKE 4 TABLETS BY MOUTH 1 TIME A WEEK 48 tablet 0   metoprolol tartrate (LOPRESSOR) 25 MG tablet TAKE 1 TABLET(25 MG) BY MOUTH TWICE DAILY 180 tablet 3   metroNIDAZOLE (METROGEL) 0.75 % gel Apply 1 application topically 2 (two) times daily.      Multiple Vitamins-Minerals (CENTRUM SILVER PO) Take 1 capsule by mouth daily.     Polyethylene Glycol 3350 (MIRALAX PO) Take 1 Dose by mouth as needed.     progesterone (PROMETRIUM) 200 MG capsule Take 200 mg by mouth daily.     RINVOQ 15 MG TB24 TAKE 1 TABLET BY MOUTH EVERY DAY 30 tablet 2   valACYclovir (VALTREX) 1000 MG tablet Take 1,000 mg by mouth as needed (fever blister). Reported on 06/14/2015     Wheat Dextrin (BENEFIBER DRINK MIX PO) Take 1 Scoop by mouth at bedtime.     Zoledronic Acid (RECLAST IV) Inject into the vein once. Once a year (Patient not taking: Reported on 07/25/2023)     No current facility-administered medications for this visit.    SURGICAL HISTORY:  Past Surgical History:  Procedure Laterality Date   CARDIAC CATHETERIZATION N/A 03/29/2015   Procedure: Left Heart Cath and Coronary Angiography;  Surgeon: Lyn Records, MD;  Location: [facility] INVASIVE CV LAB;  Service: Cardiovascular;  Laterality: N/A;   CATARACT EXTRACTION, BILATERAL     COLONOSCOPY W/ POLYPECTOMY     X1; negative subsequently   DILATION AND CURETTAGE OF UTERUS     EYE SURGERY     due to RA   EYE SURGERY Left 06/2017   eyelid tendon    HYSTEROSCOPY WITH D & C  06/24/2012   Procedure: DILATATION AND  CURETTAGE /HYSTEROSCOPY;  Surgeon: Loney Laurence, MD;  Location: [facility];  Service: Gynecology;  Laterality: N/A;   SHOULDER SURGERY Left    due to RA   SQUAMOUS CELL CARCINOMA EXCISION     scalp and left leg   TRABECULECTOMY     X 2 OD; X 1 OS   UPPER GASTROINTESTINAL ENDOSCOPY     hiatal hernia    REVIEW OF SYSTEMS:  Constitutional: positive for fatigue Eyes: negative Ears, nose, mouth, throat, and face: negative Respiratory: negative Cardiovascular: negative Gastrointestinal: negative Genitourinary:negative Integument/breast: negative Hematologic/lymphatic: negative Musculoskeletal:positive for arthralgias Neurological: negative Behavioral/Psych: negative Endocrine: negative Allergic/Immunologic: negative   PHYSICAL EXAMINATION: General appearance: alert, cooperative, fatigued, and no distress Head: Normocephalic, without obvious abnormality, atraumatic Neck: no adenopathy, no JVD, supple, symmetrical, trachea midline, and  thyroid not enlarged, symmetric, no tenderness/mass/nodules Lymph nodes: Cervical, supraclavicular, and axillary nodes normal. Resp: clear to auscultation bilaterally Back: symmetric, no curvature. ROM normal. No CVA tenderness. Cardio: regular rate and rhythm, S1, S2 normal, no murmur, click, rub or gallop GI: soft, non-tender; bowel sounds normal; no masses,  no organomegaly Extremities: extremities normal, atraumatic, no cyanosis or edema Neurologic: Alert and oriented X 3, normal strength and tone. Normal symmetric reflexes. Normal coordination and gait  ECOG PERFORMANCE STATUS: 1 - Symptomatic but completely ambulatory  Blood pressure (!) 158/61, pulse 61, temperature (!) 97.4 F (36.3 C), temperature source Temporal, resp. rate 15, height 5\' 5"  (1.651 m), weight 140 lb 12.8 oz (63.9 kg), SpO2 100%.  LABORATORY DATA: Lab Results  Component Value Date   WBC 5.7 08/20/2023   HGB 10.4 (L) 08/20/2023   HCT 30.6 (L) 08/20/2023   MCV 95.6  08/20/2023   PLT 384 08/20/2023      Chemistry      Component Value Date/Time   NA 135 07/25/2023 1218   NA 135 04/08/2022 1542   K 4.1 07/25/2023 1218   CL 106 07/25/2023 1218   CO2 21 (L) 07/25/2023 1218   BUN 14 07/25/2023 1218   BUN 21 04/08/2022 1542   CREATININE 0.86 07/25/2023 1218   CREATININE 0.92 05/20/2023 1111   CREATININE 0.97 (H) 03/17/2023 1037      Component Value Date/Time   CALCIUM 9.6 07/25/2023 1218   ALKPHOS 37 (L) 05/20/2023 1111   AST 27 08/11/2023 1351   AST 31 05/20/2023 1111   ALT 22 08/11/2023 1351   ALT 21 05/20/2023 1111   BILITOT 0.5 08/11/2023 1351   BILITOT 0.5 05/20/2023 1111       RADIOGRAPHIC STUDIES: No results found.  ASSESSMENT AND PLAN:    Chronic anemia Chronic anemia likely due to anemia of chronic disease or possible blood loss. Hemoglobin remains low at 10.4 g/dL, consistent with previous levels. Iron levels are good, and B12 and serum protein electrophoresis are normal. She reports improvement in symptoms with Integra Plus, though it is not covered by insurance. Constipation and dark stools noted, likely due to iron supplementation. No blood observed in stool, but stool cards will be used to check for occult blood. Anemia present since teenage years. - Prescribe Integra Plus if affordable - Provide stool cards for fecal occult blood test  Thrombocytosis Previously elevated platelet count, likely reactive to iron deficiency. Platelet count has improved and stabilized with iron supplementation.  Proteinuria High protein levels in urine noted during a 24-hour urine collection. No blood or bacteria found in urine. Urologist evaluated kidneys and bladder, finding no significant issues. Swelling around ankles noted, possibly related to proteinuria or medication. Referral to a nephrologist may be needed for further evaluation. - Consider referral to a nephrologist for further evaluation of proteinuria and swelling  Urinary urgency  and frequency She experiences urinary urgency and frequency. Urologist prescribed Gemtesa and provided samples. Follow-up with urologist planned in six weeks to assess response to treatment. - Continue Gemtesa as prescribed - Follow up with urologist in six weeks  Hypertension Recent episodes of hypertension, possibly exacerbated by pain from rheumatoid arthritis. Blood pressure readings today were 182/73 mmHg initially, then decreased to 158/61 mmHg. She has been advised to adjust clonidine dosage, which has improved blood pressure control. - Advise follow-up with family doctor for blood pressure management  Rheumatoid arthritis She reports pain associated with rheumatoid arthritis, which may contribute to elevated blood pressure. Rheumatologist  is involved in management. - Continue management with rheumatologist   The patient voices understanding of current disease status and treatment options and is in agreement with the current care plan.  All questions were answered. The patient knows to call the clinic with any problems, questions or concerns. We can certainly see the patient much sooner if necessary.  The total time spent in the appointment was 30 minutes.  Disclaimer: This note was dictated with voice recognition software. Similar sounding words can inadvertently be transcribed and may not be corrected upon review.

## 2023-08-20 NOTE — Addendum Note (Signed)
 Addended by: Tonny Bollman on: 08/20/2023 05:54 PM   Modules accepted: Orders

## 2023-08-21 ENCOUNTER — Ambulatory Visit (HOSPITAL_BASED_OUTPATIENT_CLINIC_OR_DEPARTMENT_OTHER): Admitting: Student

## 2023-08-21 ENCOUNTER — Ambulatory Visit (HOSPITAL_BASED_OUTPATIENT_CLINIC_OR_DEPARTMENT_OTHER)

## 2023-08-21 DIAGNOSIS — M25572 Pain in left ankle and joints of left foot: Secondary | ICD-10-CM | POA: Diagnosis not present

## 2023-08-21 DIAGNOSIS — M7062 Trochanteric bursitis, left hip: Secondary | ICD-10-CM

## 2023-08-21 DIAGNOSIS — M25571 Pain in right ankle and joints of right foot: Secondary | ICD-10-CM | POA: Diagnosis not present

## 2023-08-21 DIAGNOSIS — R609 Edema, unspecified: Secondary | ICD-10-CM | POA: Diagnosis not present

## 2023-08-21 DIAGNOSIS — M7989 Other specified soft tissue disorders: Secondary | ICD-10-CM | POA: Diagnosis not present

## 2023-08-21 MED ORDER — LIDOCAINE HCL 1 % IJ SOLN
4.0000 mL | INTRAMUSCULAR | Status: AC | PRN
  Administered 2023-08-21: 4 mL

## 2023-08-21 MED ORDER — TRIAMCINOLONE ACETONIDE 40 MG/ML IJ SUSP
2.0000 mL | INTRAMUSCULAR | Status: AC | PRN
  Administered 2023-08-21: 2 mL via INTRA_ARTICULAR

## 2023-08-21 NOTE — Progress Notes (Signed)
 Chief Complaint: Bilateral ankle pain and left hip pain     History of Present Illness:   Discussed the use of AI scribe software for clinical note transcription with the patient, who gave verbal consent to proceed.  BRIYANNA [name] is an 83 year old female with a history of left ankle injury who presents bilateral ankle pain left worse than right. She has experienced swelling and pain in the left ankle since working on her driveway on Monday. The swelling has persisted throughout the week, and significant pain last night affected her sleep. The pain is primarily located in the shin area and the ankle itself, with swelling significant enough to prevent wearing her usual shoes. No significant changes in the right ankle although this is mildly painful.  Her left ankle was previously injured in 2013 when a heavy box fell on her leg and foot, resulting in damage to the foot and ankle. The leg has been weaker since the injury, affecting her walking and endurance. She experiences increased swelling and pain when she overexerts herself.  She reports lateral hip pain, which she attributes to a recent incident where she felt a pull or snap while reaching under a table. This pain has persisted for about a month. She was previously considered for a cortisone shot for hip pain, which was not administered due to elevated blood pressure at the time. She is currently taking Tylenol for pain management and has been monitoring her blood pressure, which has been managed with medication adjustments. Her blood pressure was recently recorded at 126/59.   Surgical History:   None  PMH/PSH/Family History/Social History/Meds/Allergies:    Past Medical History:  Diagnosis Date   Adenomatous colon polyp 2004   Colo in 2009 "no polyps"   Complication of anesthesia    small airway per patient   Concussion    Echocardiogram    Echo 8/22 EF 60-65, no RWMA, mild LVH, normal RVSF,  moderately elevated PASP (RVSP 50.9), trivial MR, mild AI, AV sclerosis without stenosis   GERD (gastroesophageal reflux disease)    Glaucoma    Hiatal hernia    Hypertension    Neuropathy    Nuclear stress test    Myoview 8/22: EF 65, no ischemia; low risk   Rheumatoid arthritis(714.0)    Orencia; Methotrexate (Dr. Titus Dubin)   Sleep apnea    on CPAP   Past Surgical History:  Procedure Laterality Date   CARDIAC CATHETERIZATION N/A 03/29/2015   Procedure: Left Heart Cath and Coronary Angiography;  Surgeon: Lyn Records, MD;  Location: [facility] INVASIVE CV LAB;  Service: Cardiovascular;  Laterality: N/A;   CATARACT EXTRACTION, BILATERAL     COLONOSCOPY W/ POLYPECTOMY     X1; negative subsequently   DILATION AND CURETTAGE OF UTERUS     EYE SURGERY     due to RA   EYE SURGERY Left 06/2017   eyelid tendon    HYSTEROSCOPY WITH D & C  06/24/2012   Procedure: DILATATION AND CURETTAGE /HYSTEROSCOPY;  Surgeon: Loney Laurence, MD;  Location: [facility];  Service: Gynecology;  Laterality: N/A;   SHOULDER SURGERY Left    due to RA   SQUAMOUS CELL CARCINOMA EXCISION     scalp and left leg   TRABECULECTOMY     X 2 OD; X 1 OS  UPPER GASTROINTESTINAL ENDOSCOPY     hiatal hernia   Social History   Socioeconomic History   Marital status: Single    Spouse name: Not on file   Number of children: 0   Years of education: Not on file   Highest education level: Not on file  Occupational History   Occupation: retired    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 Left to write and right with every thing else    Are you currently employed ?    What is your current occupation?retire   Do you live at home alone? no   Who lives with you? Family friend   What type of home do you live in: 1 story or 2 story? one       Social Drivers of Corporate investment banker Strain: Not on file  Food Insecurity: Not on file  Transportation Needs: Not on file  Physical Activity: Not on file  Stress: Not on file  Social Connections: Not on file   Family History  Problem Relation Age of Onset   Dementia Mother    Lung disease Mother        bronchiectasis   Heart disease Mother        Aortic Stenosis   Osteoporosis Mother    Heart attack Father 66       S/P CBAG   Ulcerative colitis Brother    Stroke Maternal Grandmother 33   Diabetes Neg Hx    Cancer Neg Hx    Colon cancer Neg Hx    Esophageal cancer Neg Hx    Stomach cancer Neg Hx    Pancreatic cancer Neg Hx    Liver disease Neg Hx    Allergies  Allergen Reactions   Sulfonamide Derivatives     Rash, redness   Amlodipine Swelling   Norvasc [Amlodipine Besylate]     edema   Current Outpatient Medications  Medication Sig Dispense Refill   Apoaequorin (PREVAGEN PO) Take 1 tablet by mouth daily.     Artificial Tear Solution (GENTEAL TEARS OP) Apply to eye.     Bempedoic Acid-Ezetimibe (NEXLIZET) 180-10 MG TABS TAKE 1 TABLET BY MOUTH DAILY 90 tablet 3   Carboxymethylcellulose Sodium (REFRESH OP) Apply 1 drop to eye daily as needed (FOR DRY EYES).     cloNIDine (CATAPRES) 0.1 MG tablet Take 0.5 tablets (0.05 mg total) by mouth 2 (two) times daily.     erythromycin ophthalmic ointment 1 Application at bedtime.     esomeprazole (NEXIUM) 40 MG capsule TAKE 1  CAPSULE BY MOUTH TWICE DAILY BEFORE MEALS 180 capsule 0   estradiol (ESTRACE) 1 MG tablet Take 1 mg by mouth  daily.  0   FeFum-FePoly-FA-B Cmp-C-Biot (INTEGRA PLUS) CAPS Take 1 capsule by mouth daily. 30 capsule 3   folic acid (FOLVITE) 1 MG tablet TAKE 2 TABLETS(2 MG) BY MOUTH DAILY 180 tablet 3   gabapentin (NEURONTIN) 100 MG capsule Take 4 capsules (400 mg total) by mouth at bedtime. 360 capsule 3   isosorbide mononitrate (IMDUR) 30 MG 24 hr tablet TAKE 1 TABLET BY MOUTH EVERY DAY 90 tablet 3   latanoprost (XALATAN) 0.005 % ophthalmic solution Place 1 drop into the left eye at bedtime.     losartan (COZAAR) 100 MG tablet Take 100 mg by mouth daily.     methotrexate (RHEUMATREX) 2.5 MG tablet TAKE 4 TABLETS BY MOUTH 1 TIME A WEEK 48 tablet 0   metoprolol tartrate (LOPRESSOR) 25 MG tablet TAKE 1 TABLET(25 MG) BY MOUTH TWICE DAILY 180 tablet 3   metroNIDAZOLE (METROGEL) 0.75 % gel Apply 1 application topically 2 (two) times daily.      Multiple Vitamins-Minerals (CENTRUM SILVER PO) Take 1 capsule by mouth daily.     Polyethylene Glycol 3350 (MIRALAX PO) Take 1 Dose by mouth as needed.     progesterone (PROMETRIUM) 200 MG capsule Take 200 mg by mouth daily.     RINVOQ 15 MG TB24 TAKE 1 TABLET BY MOUTH EVERY DAY 30 tablet 2   timolol (TIMOPTIC) 0.5 % ophthalmic solution Place 1 drop into both eyes daily. One drop in left eye every morning One drop in right eye every evening     valACYclovir (VALTREX) 1000 MG tablet Take 1,000 mg by mouth as needed (fever blister). Reported on 06/14/2015     Vibegron (GEMTESA) 75 MG TABS Take 1 tablet by mouth daily.     Wheat Dextrin (BENEFIBER DRINK MIX PO) Take 1 Scoop by mouth at bedtime.     Zoledronic Acid (RECLAST IV) Inject into the vein once. Once a year (Patient not taking: Reported on 07/25/2023)     No current facility-administered medications for this visit.   No results found.  Review of Systems:   A ROS was performed including pertinent positives  and negatives as documented in the HPI.  Physical Exam :   Constitutional: NAD and appears stated age Neurological: Alert and oriented Psych: Appropriate affect and cooperative There were no vitals taken for this visit.   Comprehensive Musculoskeletal Exam:    Bilateral low-grade edema of the lower extremities without any overlying erythema or warmth.  No significant tenderness with palpation and full range of motion within the right ankle.  Left ankle does have some tenderness over the lateral malleolus and ATFL.  Negative Thompson and anterior drawer tests.  Dorsalis pedis pulses 2+. Tenderness in the left hip directly over the greater trochanter without erythema or warmth.  Small amount of bursal fluid of the greater trochanter seen on ultrasound.  Imaging:   Xray (right ankle 3 views, left ankle 3 views): Negative for any acute bony abnormality within bilateral ankles   I personally reviewed and interpreted the radiographs.   Assessment and Plan:    Left ankle pain and swelling   Chronic weakness and reduced mobility persist following a 2013 injury, with recent exacerbation likely due to overuse. The ankle remains stable with no significant bony abnormalities on x-ray, though she does have some mild edema which I believe is contributing to the stiffness and tightness within the ankles. Recommend following up on this with cardiologist. Consider using a compression wrap or brace for support and to reduce  swelling.   Right ankle pain   Pain likely results from overcompensation for the left ankle. The ankle is stable with no significant abnormalities on x-ray. Consider using a compression wrap or brace for support and swelling reduction.  Trochanteric bursitis   Right hip pain is consistent with trochanteric bursitis, with tenderness over the greater trochanter. She agreed to a cortisone injection for relief. Administer the injection to the right hip using ultrasound guidance.          Procedure Note  Patient: Felicia [name]             Date of Birth: [birth year]           MRN: [id number]             Visit Date: 08/21/2023  Procedures: Visit Diagnoses:  1. Acute bilateral ankle pain   2. Trochanteric bursitis, left hip     Large Joint Inj: L greater trochanter on 08/21/2023 5:24 PM Indications: pain Details: 22 G 3.5 in needle, lateral approach Medications: 4 mL lidocaine 1 %; 2 mL triamcinolone acetonide 40 MG/ML Outcome: tolerated well, no immediate complications Procedure, treatment alternatives, risks and benefits explained, specific risks discussed. Consent was given by the patient. Immediately prior to procedure a time out was called to verify the correct patient, procedure, equipment, support staff and site/side marked as required. Patient was prepped and draped in the usual sterile fashion.       I personally saw and evaluated the patient, and participated in the management and treatment plan.  Hazle Nordmann, PA-C Orthopedics

## 2023-08-24 ENCOUNTER — Other Ambulatory Visit: Payer: Self-pay | Admitting: Internal Medicine

## 2023-08-27 ENCOUNTER — Other Ambulatory Visit (HOSPITAL_COMMUNITY): Payer: Self-pay

## 2023-08-27 ENCOUNTER — Other Ambulatory Visit: Payer: Self-pay | Admitting: Physician Assistant

## 2023-08-27 ENCOUNTER — Telehealth: Payer: Self-pay | Admitting: Pharmacy Technician

## 2023-08-27 DIAGNOSIS — R3915 Urgency of urination: Secondary | ICD-10-CM | POA: Diagnosis not present

## 2023-08-27 DIAGNOSIS — R809 Proteinuria, unspecified: Secondary | ICD-10-CM | POA: Diagnosis not present

## 2023-08-27 DIAGNOSIS — M25472 Effusion, left ankle: Secondary | ICD-10-CM | POA: Diagnosis not present

## 2023-08-27 DIAGNOSIS — D649 Anemia, unspecified: Secondary | ICD-10-CM | POA: Diagnosis not present

## 2023-08-27 DIAGNOSIS — M25471 Effusion, right ankle: Secondary | ICD-10-CM | POA: Diagnosis not present

## 2023-08-27 DIAGNOSIS — M069 Rheumatoid arthritis, unspecified: Secondary | ICD-10-CM | POA: Diagnosis not present

## 2023-08-27 DIAGNOSIS — I10 Essential (primary) hypertension: Secondary | ICD-10-CM | POA: Diagnosis not present

## 2023-08-27 DIAGNOSIS — D75838 Other thrombocytosis: Secondary | ICD-10-CM | POA: Diagnosis not present

## 2023-08-27 DIAGNOSIS — R350 Frequency of micturition: Secondary | ICD-10-CM | POA: Diagnosis not present

## 2023-08-27 NOTE — Telephone Encounter (Signed)
 Last Fill: 05/22/2023  Labs: 08/20/2023 RBC 3.20, Hgb 10.4, Hct 30.6 08/11/2023 Alk. Phos 36 07/25/2023 CO2 21  Next Visit: 09/01/2023  Last Visit: 07/25/2023  DX: Rheumatoid arthritis involving multiple sites with positive rheumatoid factor   Current Dose per office note 07/25/2023: Methotrexate 4 tablets by mouth every 7 days   Okay to refill Methotrexate?

## 2023-08-28 DIAGNOSIS — M25471 Effusion, right ankle: Secondary | ICD-10-CM | POA: Diagnosis not present

## 2023-08-28 DIAGNOSIS — D649 Anemia, unspecified: Secondary | ICD-10-CM | POA: Diagnosis not present

## 2023-08-28 DIAGNOSIS — H401233 Low-tension glaucoma, bilateral, severe stage: Secondary | ICD-10-CM | POA: Diagnosis not present

## 2023-08-28 DIAGNOSIS — H02834 Dermatochalasis of left upper eyelid: Secondary | ICD-10-CM | POA: Diagnosis not present

## 2023-08-28 DIAGNOSIS — I10 Essential (primary) hypertension: Secondary | ICD-10-CM | POA: Diagnosis not present

## 2023-08-28 DIAGNOSIS — R3915 Urgency of urination: Secondary | ICD-10-CM | POA: Diagnosis not present

## 2023-08-28 DIAGNOSIS — R350 Frequency of micturition: Secondary | ICD-10-CM | POA: Diagnosis not present

## 2023-08-28 DIAGNOSIS — H02831 Dermatochalasis of right upper eyelid: Secondary | ICD-10-CM | POA: Diagnosis not present

## 2023-08-28 DIAGNOSIS — D75838 Other thrombocytosis: Secondary | ICD-10-CM | POA: Diagnosis not present

## 2023-08-28 DIAGNOSIS — M25472 Effusion, left ankle: Secondary | ICD-10-CM | POA: Diagnosis not present

## 2023-08-28 DIAGNOSIS — M069 Rheumatoid arthritis, unspecified: Secondary | ICD-10-CM | POA: Diagnosis not present

## 2023-08-28 DIAGNOSIS — R809 Proteinuria, unspecified: Secondary | ICD-10-CM | POA: Diagnosis not present

## 2023-08-28 DIAGNOSIS — H409 Unspecified glaucoma: Secondary | ICD-10-CM | POA: Diagnosis not present

## 2023-08-29 DIAGNOSIS — I10 Essential (primary) hypertension: Secondary | ICD-10-CM | POA: Diagnosis not present

## 2023-08-29 DIAGNOSIS — D75838 Other thrombocytosis: Secondary | ICD-10-CM | POA: Diagnosis not present

## 2023-08-29 DIAGNOSIS — D649 Anemia, unspecified: Secondary | ICD-10-CM | POA: Diagnosis not present

## 2023-08-29 DIAGNOSIS — R350 Frequency of micturition: Secondary | ICD-10-CM | POA: Diagnosis not present

## 2023-08-29 DIAGNOSIS — M25472 Effusion, left ankle: Secondary | ICD-10-CM | POA: Diagnosis not present

## 2023-08-29 DIAGNOSIS — M25471 Effusion, right ankle: Secondary | ICD-10-CM | POA: Diagnosis not present

## 2023-08-29 DIAGNOSIS — M069 Rheumatoid arthritis, unspecified: Secondary | ICD-10-CM | POA: Diagnosis not present

## 2023-08-29 DIAGNOSIS — R809 Proteinuria, unspecified: Secondary | ICD-10-CM | POA: Diagnosis not present

## 2023-08-29 DIAGNOSIS — R3915 Urgency of urination: Secondary | ICD-10-CM | POA: Diagnosis not present

## 2023-08-29 LAB — OCCULT BLOOD X 1 CARD TO LAB, STOOL
Fecal Occult Bld: NEGATIVE
Fecal Occult Bld: NEGATIVE
Fecal Occult Bld: NEGATIVE

## 2023-09-01 ENCOUNTER — Encounter: Payer: Self-pay | Admitting: Physician Assistant

## 2023-09-01 ENCOUNTER — Ambulatory Visit: Payer: Medicare PPO | Attending: Physician Assistant | Admitting: Physician Assistant

## 2023-09-01 VITALS — BP 112/63 | HR 66 | Resp 13 | Ht 65.0 in | Wt 135.2 lb

## 2023-09-01 DIAGNOSIS — Z8719 Personal history of other diseases of the digestive system: Secondary | ICD-10-CM

## 2023-09-01 DIAGNOSIS — I2510 Atherosclerotic heart disease of native coronary artery without angina pectoris: Secondary | ICD-10-CM

## 2023-09-01 DIAGNOSIS — M5030 Other cervical disc degeneration, unspecified cervical region: Secondary | ICD-10-CM

## 2023-09-01 DIAGNOSIS — D649 Anemia, unspecified: Secondary | ICD-10-CM

## 2023-09-01 DIAGNOSIS — Z111 Encounter for screening for respiratory tuberculosis: Secondary | ICD-10-CM

## 2023-09-01 DIAGNOSIS — D75839 Thrombocytosis, unspecified: Secondary | ICD-10-CM | POA: Diagnosis not present

## 2023-09-01 DIAGNOSIS — M19041 Primary osteoarthritis, right hand: Secondary | ICD-10-CM | POA: Diagnosis not present

## 2023-09-01 DIAGNOSIS — M7062 Trochanteric bursitis, left hip: Secondary | ICD-10-CM | POA: Diagnosis not present

## 2023-09-01 DIAGNOSIS — Z8589 Personal history of malignant neoplasm of other organs and systems: Secondary | ICD-10-CM

## 2023-09-01 DIAGNOSIS — G629 Polyneuropathy, unspecified: Secondary | ICD-10-CM

## 2023-09-01 DIAGNOSIS — Z79899 Other long term (current) drug therapy: Secondary | ICD-10-CM

## 2023-09-01 DIAGNOSIS — M47816 Spondylosis without myelopathy or radiculopathy, lumbar region: Secondary | ICD-10-CM

## 2023-09-01 DIAGNOSIS — M0579 Rheumatoid arthritis with rheumatoid factor of multiple sites without organ or systems involvement: Secondary | ICD-10-CM | POA: Diagnosis not present

## 2023-09-01 DIAGNOSIS — M170 Bilateral primary osteoarthritis of knee: Secondary | ICD-10-CM

## 2023-09-01 DIAGNOSIS — E222 Syndrome of inappropriate secretion of antidiuretic hormone: Secondary | ICD-10-CM

## 2023-09-01 DIAGNOSIS — M19042 Primary osteoarthritis, left hand: Secondary | ICD-10-CM

## 2023-09-01 DIAGNOSIS — M810 Age-related osteoporosis without current pathological fracture: Secondary | ICD-10-CM

## 2023-09-01 DIAGNOSIS — Z860100 Personal history of colon polyps, unspecified: Secondary | ICD-10-CM

## 2023-09-01 DIAGNOSIS — Z8669 Personal history of other diseases of the nervous system and sense organs: Secondary | ICD-10-CM

## 2023-09-01 DIAGNOSIS — Z8639 Personal history of other endocrine, nutritional and metabolic disease: Secondary | ICD-10-CM

## 2023-09-01 DIAGNOSIS — I10 Essential (primary) hypertension: Secondary | ICD-10-CM

## 2023-09-01 NOTE — Patient Instructions (Signed)

## 2023-09-01 NOTE — Addendum Note (Signed)
 Addended by: Ellen Henri on: 09/01/2023 12:03 PM   Modules accepted: Orders

## 2023-09-04 ENCOUNTER — Telehealth: Payer: Self-pay | Admitting: Pharmacist

## 2023-09-04 NOTE — Telephone Encounter (Signed)
 Patient called with home BP readings it ~ 125/58 HR 60-65 lowest reading 99/45 advised to stop taking clonidine 0.05 mg twice daily only take 0.1 mg if SBP >180 and continue with losartan 100 mg daily and metoprolol 25 mg twice daily. Pt has f/u apt with me on April 18.

## 2023-09-15 NOTE — Progress Notes (Unsigned)
 Patient ID: Felicia [name]                 DOB: [birth year]                      MRN: [id number]      HPI: Felicia [name] is a 83 y.o. female referred by Dr. Paulita Boss  to HTN clinic. PMH is significant for pulmonary hypertension, mitral valve disease, HTN, CAD, HLD.  Last time when patient saw Dr.Chandrasekhar, in office BP was elevated but patient reported she gets anxious at doctor's visit  Patient was put on Clonidine for uncontrolled BP by her PCP. She had cholinergic s/e from it. So patient was tapered off of clonidine and advised to keep log and follow up with PharmD.   Patient presented with her good friend today and reports her  BP is much better range compared to previous readings. She feels better too. Brought in home BP monitor for validation it SBP 11-15 points higher compared to office readings. She has ordered the other cuff but it does not fits that well on her arm so does not like to use new one. She follow low salt diet and stays active around the house  Current HTN meds: losartan  100 mg daily and metoprolol  25 mg twice daily.  Previously tried: clonidine - anticholinergic s/e  BP goal: <130/80  Family History:      Relation Problem Comments  Mother (Deceased) Dementia   Heart disease Aortic Stenosis  Lung disease bronchiectasis  Osteoporosis     Father (Deceased) Heart attack (Age: 88) S/P CBAG    Brother Metallurgist) Ulcerative colitis     Brother (Deceased)   Maternal Grandmother (Deceased) Stroke (Age: 30)     Maternal Grandfather (Deceased)   Paternal Grandmother (Deceased)   Paternal Grandfather (Deceased)      Social History:  Alcohol: none  Smoking:none  Diet: low salt diet   Exercise: none due to peripheral neuropathy, stays active around the house   Home BP readings: ~125/82    Wt Readings from Last 3 Encounters:  09/01/23 135 lb 3.2 oz (61.3 kg)  08/20/23 140 lb 12.8 oz (63.9 kg)  07/25/23 139 lb 3.2 oz (63.1 kg)   BP Readings from Last 3  Encounters:  09/19/23 128/66  09/01/23 112/63  08/20/23 (!) 158/61   Pulse Readings from Last 3 Encounters:  09/19/23 80  09/01/23 66  08/20/23 61    Renal function: CrCl cannot be calculated (Patient's most recent lab result is older than the maximum 21 days allowed.).  Past Medical History:  Diagnosis Date   Adenomatous colon polyp 2004   Colo in 2009 "no polyps"   Complication of anesthesia    small airway per patient   Concussion    Echocardiogram    Echo 8/22 EF 60-65, no RWMA, mild LVH, normal RVSF, moderately elevated PASP (RVSP 50.9), trivial MR, mild AI, AV sclerosis without stenosis   GERD (gastroesophageal reflux disease)    Glaucoma    Hiatal hernia    Hypertension    Neuropathy    Nuclear stress test    Myoview  8/22: EF 65, no ischemia; low risk   Rheumatoid arthritis(714.0)    Orencia ; Methotrexate  (Dr. Georgiann Kirsch)   Sleep apnea    on CPAP    Current Outpatient Medications on File Prior to Visit  Medication Sig Dispense Refill   Apoaequorin (PREVAGEN PO) Take 1 tablet by mouth daily.     Artificial Tear  Solution (GENTEAL TEARS OP) Apply to eye.     Bempedoic Acid-Ezetimibe  (NEXLIZET ) 180-10 MG TABS TAKE 1 TABLET BY MOUTH DAILY 90 tablet 3   Carboxymethylcellulose Sodium (REFRESH OP) Apply 1 drop to eye daily as needed (FOR DRY EYES).     cloNIDine (CATAPRES) 0.1 MG tablet Take 1 tablet (0.1 mg total) by mouth daily as needed. If SBP >=180     erythromycin ophthalmic ointment 1 Application at bedtime.     esomeprazole  (NEXIUM ) 40 MG capsule TAKE 1 CAPSULE BY MOUTH TWICE DAILY BEFORE MEALS 180 capsule 1   estradiol  (ESTRACE ) 1 MG tablet Take 1 mg by mouth daily.  0   folic acid  (FOLVITE ) 1 MG tablet TAKE 2 TABLETS(2 MG) BY MOUTH DAILY 180 tablet 3   gabapentin  (NEURONTIN ) 100 MG capsule Take 4 capsules (400 mg total) by mouth at bedtime. 360 capsule 3   isosorbide  mononitrate (IMDUR ) 30 MG 24 hr tablet TAKE 1 TABLET BY MOUTH EVERY DAY 90 tablet 3    latanoprost  (XALATAN ) 0.005 % ophthalmic solution Place 1 drop into the left eye at bedtime.     losartan  (COZAAR ) 100 MG tablet Take 100 mg by mouth daily.     methotrexate  (RHEUMATREX) 2.5 MG tablet TAKE 4 TABLETS BY MOUTH 1 TIME A WEEK 48 tablet 0   metoprolol  tartrate (LOPRESSOR ) 25 MG tablet TAKE 1 TABLET(25 MG) BY MOUTH TWICE DAILY 180 tablet 3   metroNIDAZOLE  (METROGEL ) 0.75 % gel Apply 1 application topically 2 (two) times daily.      Multiple Vitamins-Minerals (CENTRUM SILVER PO) Take 1 capsule by mouth daily.     Polyethylene Glycol 3350 (MIRALAX PO) Take 1 Dose by mouth as needed.     progesterone  (PROMETRIUM ) 200 MG capsule Take 200 mg by mouth daily.     RINVOQ  15 MG TB24 TAKE 1 TABLET BY MOUTH EVERY DAY 30 tablet 2   timolol  (TIMOPTIC ) 0.5 % ophthalmic solution Place 1 drop into both eyes daily. One drop in left eye every morning One drop in right eye every evening     valACYclovir  (VALTREX ) 1000 MG tablet Take 1,000 mg by mouth as needed (fever blister). Reported on 06/14/2015     Vibegron (GEMTESA) 75 MG TABS Take 1 tablet by mouth daily.     Wheat Dextrin (BENEFIBER DRINK MIX PO) Take 1 Scoop by mouth at bedtime.     Zoledronic  Acid (RECLAST  IV) Inject into the vein once. Once a year (Patient not taking: Reported on 09/01/2023)     No current facility-administered medications on file prior to visit.    Allergies  Allergen Reactions   Sulfonamide Derivatives     Rash, redness   Amlodipine  Swelling   Norvasc  [Amlodipine  Besylate]     edema    Blood pressure 128/66, pulse 80.   Assessment/Plan:  1. Hypertension -  Essential hypertension Assessment: BP is controlled in office BP 128/66 mmHg heart rate 80 (goal<130/80)  Home cuff validated - reads ~11-15 points higher than office cuff  Tolerates losartan  100 mg and metoprolol  25 mg twice daily well without any side effects Did not need to use PRN Clonidine as SBP at home staying <180 in the past 1 month  Denies SOB,  palpitation, chest pain, headaches,or swelling Follows heart healthy diet and exercise capacity limited by chronic pain   Plan:  No change to the current BP medications  Continue taking Losartan  100 mg daily and metoprolol  25 mg twice daily. Take clonidine 0.1 mg only when needed  when your your systolic BP >180 Patient to keep record of BP readings with heart rate and report to us  at the next visit Patient to see PharmD in 6 weeks for follow up  Follow up lab(s):none     Thank you  Nickola Baron, Pharm.D [facility] of [facility] [address] [place], [place] [address]  Phone: [phone]; Fax: [phone]
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
## 2023-09-19 ENCOUNTER — Ambulatory Visit: Attending: Cardiology | Admitting: Pharmacist

## 2023-09-19 VITALS — BP 128/66 | HR 80

## 2023-09-19 DIAGNOSIS — I10 Essential (primary) hypertension: Secondary | ICD-10-CM

## 2023-09-19 NOTE — Patient Instructions (Addendum)
 Changes made by your pharmacist Nickola Baron, PharmD at today's visit:    Instructions/Changes  (what do you need to do) Your Notes  (what you did and when you did it)  Continue taking Losartan  100 mg daily and metoprolol  25 mg twice daily. Take clonidine 0.1 mg only when needed when your your systolic BP >180    Bring all of your meds, your BP cuff and your record of home blood pressures to your next appointment.    HOW TO TAKE YOUR BLOOD PRESSURE AT HOME  Rest 5 minutes before taking your blood pressure.  Don't smoke or drink caffeinated beverages for at least 30 minutes before. Take your blood pressure before (not after) you eat. Sit comfortably with your back supported and both feet on the floor (don't cross your legs). Elevate your arm to heart level on a table or a desk. Use the proper sized cuff. It should fit smoothly and snugly around your bare upper arm. There should be enough room to slip a fingertip under the cuff. The bottom edge of the cuff should be 1 inch above the crease of the elbow. Ideally, take 3 measurements at one sitting and record the average.  Important lifestyle changes to control high blood pressure  Intervention  Effect on the BP  Lose extra pounds and watch your waistline Weight loss is one of the most effective lifestyle changes for controlling blood pressure. If you're overweight or obese, losing even a small amount of weight can help reduce blood pressure. Blood pressure might go down by about 1 millimeter of mercury (mm Hg) with each kilogram (about 2.2 pounds) of weight lost.  Exercise regularly As a general goal, aim for at least 30 minutes of moderate physical activity every day. Regular physical activity can lower high blood pressure by about 5 to 8 mm Hg.  Eat a healthy diet Eating a diet rich in whole grains, fruits, vegetables, and low-fat dairy products and low in saturated fat and cholesterol. A healthy diet can lower high blood pressure by up  to 11 mm Hg.  Reduce salt (sodium) in your diet Even a small reduction of sodium in the diet can improve heart health and reduce high blood pressure by about 5 to 6 mm Hg.  Limit alcohol One drink equals 12 ounces of beer, 5 ounces of wine, or 1.5 ounces of 80-proof liquor.  Limiting alcohol to less than one drink a day for women or two drinks a day for men can help lower blood pressure by about 4 mm Hg.   If you have any questions or concerns please use My Chart to send questions or call the office at [phone]

## 2023-09-21 ENCOUNTER — Other Ambulatory Visit: Payer: Self-pay | Admitting: Internal Medicine

## 2023-09-22 ENCOUNTER — Ambulatory Visit: Admitting: Physical Therapy

## 2023-09-22 ENCOUNTER — Encounter: Payer: Self-pay | Admitting: Physical Therapy

## 2023-09-22 ENCOUNTER — Encounter: Payer: Self-pay | Admitting: Pharmacist

## 2023-09-22 DIAGNOSIS — M25552 Pain in left hip: Secondary | ICD-10-CM

## 2023-09-22 DIAGNOSIS — R2681 Unsteadiness on feet: Secondary | ICD-10-CM

## 2023-09-22 DIAGNOSIS — M6281 Muscle weakness (generalized): Secondary | ICD-10-CM

## 2023-09-22 DIAGNOSIS — M7062 Trochanteric bursitis, left hip: Secondary | ICD-10-CM | POA: Diagnosis not present

## 2023-09-22 DIAGNOSIS — R262 Difficulty in walking, not elsewhere classified: Secondary | ICD-10-CM
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
## 2023-09-22 NOTE — Assessment & Plan Note (Signed)
 Assessment: BP is controlled in office BP 128/66 mmHg heart rate 80 (goal<130/80)  Home cuff validated - reads ~11-15 points higher than office cuff  Tolerates losartan  100 mg and metoprolol  25 mg twice daily well without any side effects Did not need to use PRN Clonidine as SBP at home staying <180 in the past 1 month  Denies SOB, palpitation, chest pain, headaches,or swelling Follows heart healthy diet and exercise capacity limited by chronic pain   Plan:  No change to the current BP medications  Continue taking Losartan  100 mg daily and metoprolol  25 mg twice daily. Take clonidine 0.1 mg only when needed when your your systolic BP >180 Patient to keep record of BP readings with heart rate and report to us  at the next visit Patient to see PharmD in 6 weeks for follow up  Follow up lab(s):none

## 2023-09-24 ENCOUNTER — Ambulatory Visit: Admitting: Physical Therapy

## 2023-09-24 ENCOUNTER — Encounter: Payer: Self-pay | Admitting: Physical Therapy

## 2023-09-24 DIAGNOSIS — R262 Difficulty in walking, not elsewhere classified: Secondary | ICD-10-CM

## 2023-09-24 DIAGNOSIS — R2681 Unsteadiness on feet: Secondary | ICD-10-CM

## 2023-09-24 DIAGNOSIS — M25552 Pain in left hip: Secondary | ICD-10-CM | POA: Diagnosis not present

## 2023-09-24 DIAGNOSIS — M6281 Muscle weakness (generalized): Secondary | ICD-10-CM | POA: Diagnosis not present
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
## 2023-09-25 ENCOUNTER — Ambulatory Visit: Admitting: Neurology

## 2023-09-25 ENCOUNTER — Encounter: Payer: Self-pay | Admitting: Neurology

## 2023-09-25 VITALS — BP 138/66 | HR 75 | Ht 65.0 in | Wt 135.0 lb

## 2023-09-25 DIAGNOSIS — M0579 Rheumatoid arthritis with rheumatoid factor of multiple sites without organ or systems involvement: Secondary | ICD-10-CM

## 2023-09-25 DIAGNOSIS — F0781 Postconcussional syndrome: Secondary | ICD-10-CM

## 2023-09-25 DIAGNOSIS — H547 Unspecified visual loss: Secondary | ICD-10-CM

## 2023-09-25 DIAGNOSIS — R413 Other amnesia: Secondary | ICD-10-CM | POA: Diagnosis not present

## 2023-09-25 DIAGNOSIS — G629 Polyneuropathy, unspecified: Secondary | ICD-10-CM

## 2023-09-25 DIAGNOSIS — R4189 Other symptoms and signs involving cognitive functions and awareness: Secondary | ICD-10-CM | POA: Diagnosis not present

## 2023-09-25 MED ORDER — GABAPENTIN 100 MG PO CAPS: 300.0000 mg | ORAL_CAPSULE | Freq: Every day | ORAL | 3 refills | Status: AC

## 2023-09-25 NOTE — Patient Instructions (Addendum)
 We will reduce your gabapentin  to 300 mg at bedtime.  Continue physical therapy.  - Recommend the following measures that may provide some symptomatic benefit for muscle twitching and/or cramps: - Adequate oral clear fluid intake to maintain optimal hydration (about 2.5 liters, or around 8-10 glasses per day) Avoidance of caffeine Trial of DIET tonic water: About 1 glass, up to 6 times daily Magnesium  oxide up to 400 mg by mouth twice daily, as needed (over the counter) Gentle muscle stretching routine, especially before bedtime  I will see you back in clinic in 6 months. Please let me know if you have any questions or concerns in the meantime.  The physicians and staff at [facility] are committed to providing excellent care. You may receive a survey requesting feedback about your experience at our office. We strive to receive "very good" responses to the survey questions. If you feel that your experience would prevent you from giving the office a "very good " response, please contact our office to try to remedy the situation. We may be reached at [phone]. Thank you for taking the time out of your busy day to complete the survey.  Rommie Coats, MD  Neurology  Preventing Falls at [facility] are common, often dreaded events in the lives of older people. Aside from the obvious injuries and even death that may result, fall can cause wide-ranging consequences including loss of independence, mental decline, decreased activity and mobility. Younger people are also at risk of falling, especially those with chronic illnesses and fatigue.  Ways to reduce risk for falling Examine diet and medications. Warm foods and alcohol dilate blood vessels, which can lead to dizziness when standing. Sleep aids, antidepressants and pain medications can also increase the likelihood of a fall.  Get a vision exam. Poor vision, cataracts and glaucoma increase the chances of falling.  Check foot  gear. Shoes should fit snugly and have a sturdy, nonskid sole and a broad, low heel  Participate in a physician-approved exercise program to build and maintain muscle strength and improve balance and coordination. Programs that use ankle weights or stretch bands are excellent for muscle-strengthening. Water aerobics programs and low-impact Tai Chi programs have also been shown to improve balance and coordination.  Increase vitamin D  intake. Vitamin D  improves muscle strength and increases the amount of calcium  the body is able to absorb and deposit in bones.  How to prevent falls from common hazards Floors - Remove all loose wires, cords, and throw rugs. Minimize clutter. Make sure rugs are anchored and smooth. Keep furniture in its usual place.  Chairs -- Use chairs with straight backs, armrests and firm seats. Add firm cushions to existing pieces to add height.  Bathroom - Install grab bars and non-skid tape in the tub or shower. Use a bathtub transfer bench or a shower chair with a back support Use an elevated toilet seat and/or safety rails to assist standing from a low surface. Do not use towel racks or bathroom tissue holders to help you stand.  Lighting - Make sure halls, stairways, and entrances are well-lit. Install a night light in your bathroom or hallway. Make sure there is a light switch at the top and bottom of the staircase. Turn lights on if you get up in the middle of the night. Make sure lamps or light switches are within reach of the bed if you have to get up during the night.  Kitchen - Install non-skid rubber mats near the sink  and stove. Clean spills immediately. Store frequently used utensils, pots, pans between waist and eye level. This helps prevent reaching and bending. Sit when getting things out of lower cupboards.  Living room/ Bedrooms - Place furniture with wide spaces in between, giving enough room to move around. Establish a route through the living room that gives  you something to hold onto as you walk.  Stairs - Make sure treads, rails, and rugs are secure. Install a rail on both sides of the stairs. If stairs are a threat, it might be helpful to arrange most of your activities on the lower level to reduce the number of times you must climb the stairs.  Entrances and doorways - Install metal handles on the walls adjacent to the doorknobs of all doors to make it more secure as you travel through the doorway.  Tips for maintaining balance Keep at least one hand free at all times. Try using a backpack or fanny pack to hold things rather than carrying them in your hands. Never carry objects in both hands when walking as this interferes with keeping your balance.  Attempt to swing both arms from front to back while walking. This might require a conscious effort if Parkinson's disease has diminished your movement. It will, however, help you to maintain balance and posture, and reduce fatigue.  Consciously lift your feet off of the ground when walking. Shuffling and dragging of the feet is a common culprit in losing your balance.  When trying to navigate turns, use a "U" technique of facing forward and making a wide turn, rather than pivoting sharply.  Try to stand with your feet shoulder-length apart. When your feet are close together for any length of time, you increase your risk of losing your balance and falling.  Do one thing at a time. Don't try to walk and accomplish another task, such as reading or looking around. The decrease in your automatic reflexes complicates motor function, so the less distraction, the better.  Do not wear rubber or gripping soled shoes, they might "catch" on the floor and cause tripping.  Move slowly when changing positions. Use deliberate, concentrated movements and, if needed, use a grab bar or walking aid. Count 15 seconds between each movement. For example, when rising from a seated position, wait 15 seconds after standing  to begin walking.  If balance is a continuous problem, you might want to consider a walking aid such as a cane, walking stick, or walker. Once you've mastered walking with help, you might be ready to try it on your own again.

## 2023-09-26 NOTE — Telephone Encounter (Signed)
 Lab reqs has been discarded since patient hasn't picked it from our office.

## 2023-09-28 ENCOUNTER — Other Ambulatory Visit: Payer: Self-pay | Admitting: Rheumatology

## 2023-09-28 DIAGNOSIS — M0579 Rheumatoid arthritis with rheumatoid factor of multiple sites without organ or systems involvement: Secondary | ICD-10-CM

## 2023-09-29 ENCOUNTER — Ambulatory Visit: Payer: Self-pay | Admitting: Physical Therapy

## 2023-09-29 ENCOUNTER — Encounter: Payer: Self-pay | Admitting: Physical Therapy

## 2023-09-29 DIAGNOSIS — M6281 Muscle weakness (generalized): Secondary | ICD-10-CM | POA: Diagnosis not present

## 2023-09-29 DIAGNOSIS — M25552 Pain in left hip: Secondary | ICD-10-CM | POA: Diagnosis not present

## 2023-09-29 DIAGNOSIS — R262 Difficulty in walking, not elsewhere classified: Secondary | ICD-10-CM | POA: Diagnosis not present

## 2023-09-29 DIAGNOSIS — R2681 Unsteadiness on feet: Secondary | ICD-10-CM

## 2023-09-29 NOTE — Telephone Encounter (Signed)
 Last Fill: 07/07/2023  Labs: 08/20/2023 RBC 3.20, Hgb 10.4, Hct 30.6, 08/11/2023 Alk. Phos. 36  TB Gold: 12/23/2022 Neg    Next Visit: 02/03/2024  Last Visit: 09/01/2023  [id number] arthritis involving multiple sites with positive rheumatoid factor   Current Dose per office note 09/01/2023: Rinvoq  15 mg 1 tablet by mouth daily   Okay to refill Rinvoq ?
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
## 2023-10-02 ENCOUNTER — Ambulatory Visit: Payer: Medicare PPO | Admitting: Neurology

## 2023-10-03 DIAGNOSIS — R350 Frequency of micturition: Secondary | ICD-10-CM | POA: Diagnosis not present

## 2023-10-03 DIAGNOSIS — R3914 Feeling of incomplete bladder emptying: Secondary | ICD-10-CM | POA: Diagnosis not present

## 2023-10-06 ENCOUNTER — Encounter: Payer: Self-pay | Admitting: Physical Therapy

## 2023-10-06 ENCOUNTER — Ambulatory Visit: Admitting: Physical Therapy

## 2023-10-06 DIAGNOSIS — M25552 Pain in left hip: Secondary | ICD-10-CM

## 2023-10-06 DIAGNOSIS — R2681 Unsteadiness on feet: Secondary | ICD-10-CM

## 2023-10-06 DIAGNOSIS — M6281 Muscle weakness (generalized): Secondary | ICD-10-CM | POA: Diagnosis not present

## 2023-10-06 DIAGNOSIS — R262 Difficulty in walking, not elsewhere classified: Secondary | ICD-10-CM | POA: Diagnosis not present
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
## 2023-10-08 ENCOUNTER — Ambulatory Visit: Admitting: Physical Therapy

## 2023-10-08 ENCOUNTER — Encounter: Payer: Self-pay | Admitting: Physical Therapy

## 2023-10-08 DIAGNOSIS — R262 Difficulty in walking, not elsewhere classified: Secondary | ICD-10-CM

## 2023-10-08 DIAGNOSIS — M25552 Pain in left hip: Secondary | ICD-10-CM | POA: Diagnosis not present

## 2023-10-08 DIAGNOSIS — M6281 Muscle weakness (generalized): Secondary | ICD-10-CM

## 2023-10-08 DIAGNOSIS — R2681 Unsteadiness on feet: Secondary | ICD-10-CM
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
## 2023-10-09 DIAGNOSIS — L578 Other skin changes due to chronic exposure to nonionizing radiation: Secondary | ICD-10-CM | POA: Diagnosis not present

## 2023-10-09 DIAGNOSIS — L820 Inflamed seborrheic keratosis: Secondary | ICD-10-CM | POA: Diagnosis not present

## 2023-10-09 DIAGNOSIS — D225 Melanocytic nevi of trunk: Secondary | ICD-10-CM | POA: Diagnosis not present

## 2023-10-09 DIAGNOSIS — Z85828 Personal history of other malignant neoplasm of skin: Secondary | ICD-10-CM | POA: Diagnosis not present

## 2023-10-09 DIAGNOSIS — D2372 Other benign neoplasm of skin of left lower limb, including hip: Secondary | ICD-10-CM | POA: Diagnosis not present

## 2023-10-09 DIAGNOSIS — D485 Neoplasm of uncertain behavior of skin: Secondary | ICD-10-CM | POA: Diagnosis not present

## 2023-10-09 DIAGNOSIS — D045 Carcinoma in situ of skin of trunk: Secondary | ICD-10-CM | POA: Diagnosis not present

## 2023-10-09 DIAGNOSIS — L821 Other seborrheic keratosis: Secondary | ICD-10-CM | POA: Diagnosis not present

## 2023-10-14 ENCOUNTER — Encounter: Payer: Self-pay | Admitting: Physical Therapy

## 2023-10-14 ENCOUNTER — Ambulatory Visit: Admitting: Physical Therapy

## 2023-10-14 DIAGNOSIS — R2681 Unsteadiness on feet: Secondary | ICD-10-CM

## 2023-10-14 DIAGNOSIS — M6281 Muscle weakness (generalized): Secondary | ICD-10-CM | POA: Diagnosis not present

## 2023-10-14 DIAGNOSIS — R262 Difficulty in walking, not elsewhere classified: Secondary | ICD-10-CM

## 2023-10-14 DIAGNOSIS — M25552 Pain in left hip: Secondary | ICD-10-CM | POA: Diagnosis not present
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
## 2023-10-16 DIAGNOSIS — G720 Drug-induced myopathy: Secondary | ICD-10-CM | POA: Diagnosis not present

## 2023-10-16 DIAGNOSIS — R350 Frequency of micturition: Secondary | ICD-10-CM | POA: Diagnosis not present

## 2023-10-16 DIAGNOSIS — E7800 Pure hypercholesterolemia, unspecified: Secondary | ICD-10-CM | POA: Diagnosis not present

## 2023-10-16 DIAGNOSIS — G629 Polyneuropathy, unspecified: Secondary | ICD-10-CM | POA: Diagnosis not present

## 2023-10-16 DIAGNOSIS — Z23 Encounter for immunization: Secondary | ICD-10-CM | POA: Diagnosis not present

## 2023-10-16 DIAGNOSIS — I10 Essential (primary) hypertension: Secondary | ICD-10-CM | POA: Diagnosis not present

## 2023-10-16 DIAGNOSIS — I25119 Atherosclerotic heart disease of native coronary artery with unspecified angina pectoris: Secondary | ICD-10-CM | POA: Diagnosis not present

## 2023-10-16 DIAGNOSIS — Z6823 Body mass index (BMI) 23.0-23.9, adult: Secondary | ICD-10-CM | POA: Diagnosis not present

## 2023-10-16 DIAGNOSIS — R7303 Prediabetes: Secondary | ICD-10-CM | POA: Diagnosis not present

## 2023-10-20 ENCOUNTER — Encounter: Payer: Self-pay | Admitting: Physical Therapy

## 2023-10-20 ENCOUNTER — Ambulatory Visit: Admitting: Physical Therapy

## 2023-10-20 DIAGNOSIS — R2681 Unsteadiness on feet: Secondary | ICD-10-CM

## 2023-10-20 DIAGNOSIS — M25552 Pain in left hip: Secondary | ICD-10-CM | POA: Diagnosis not present

## 2023-10-20 DIAGNOSIS — R262 Difficulty in walking, not elsewhere classified: Secondary | ICD-10-CM

## 2023-10-20 DIAGNOSIS — M6281 Muscle weakness (generalized): Secondary | ICD-10-CM | POA: Diagnosis not present
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
## 2023-10-22 ENCOUNTER — Ambulatory Visit: Admitting: Physical Therapy

## 2023-10-22 ENCOUNTER — Encounter: Payer: Self-pay | Admitting: Physical Therapy

## 2023-10-22 DIAGNOSIS — R262 Difficulty in walking, not elsewhere classified: Secondary | ICD-10-CM

## 2023-10-22 DIAGNOSIS — M25552 Pain in left hip: Secondary | ICD-10-CM | POA: Diagnosis not present

## 2023-10-22 DIAGNOSIS — M6281 Muscle weakness (generalized): Secondary | ICD-10-CM | POA: Diagnosis not present

## 2023-10-22 DIAGNOSIS — R2681 Unsteadiness on feet: Secondary | ICD-10-CM
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
## 2023-10-29 ENCOUNTER — Ambulatory Visit: Admitting: Physical Therapy

## 2023-10-29 ENCOUNTER — Telehealth: Payer: Self-pay | Admitting: Physical Medicine and Rehabilitation

## 2023-10-29 ENCOUNTER — Encounter: Payer: Self-pay | Admitting: Physical Therapy

## 2023-10-29 ENCOUNTER — Telehealth: Payer: Self-pay | Admitting: Pharmacist

## 2023-10-29 DIAGNOSIS — R262 Difficulty in walking, not elsewhere classified: Secondary | ICD-10-CM | POA: Diagnosis not present

## 2023-10-29 DIAGNOSIS — R2681 Unsteadiness on feet: Secondary | ICD-10-CM

## 2023-10-29 DIAGNOSIS — M25552 Pain in left hip: Secondary | ICD-10-CM | POA: Diagnosis not present

## 2023-10-29 DIAGNOSIS — M6281 Muscle weakness (generalized): Secondary | ICD-10-CM
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
## 2023-10-29 NOTE — Telephone Encounter (Signed)
 Patient needs an appointment for lumbar injection. CB#[phone]

## 2023-10-31 ENCOUNTER — Encounter: Admitting: Physical Therapy

## 2023-11-03 ENCOUNTER — Ambulatory Visit: Admitting: Rehabilitative and Restorative Service Providers"

## 2023-11-03 ENCOUNTER — Encounter: Payer: Self-pay | Admitting: Rehabilitative and Restorative Service Providers"

## 2023-11-03 DIAGNOSIS — M25552 Pain in left hip: Secondary | ICD-10-CM | POA: Diagnosis not present

## 2023-11-03 DIAGNOSIS — R262 Difficulty in walking, not elsewhere classified: Secondary | ICD-10-CM | POA: Diagnosis not present

## 2023-11-03 DIAGNOSIS — R2681 Unsteadiness on feet: Secondary | ICD-10-CM
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
## 2023-11-05 ENCOUNTER — Ambulatory Visit: Admitting: Rehabilitative and Restorative Service Providers"

## 2023-11-05 ENCOUNTER — Encounter: Payer: Self-pay | Admitting: Rehabilitative and Restorative Service Providers"

## 2023-11-05 DIAGNOSIS — R262 Difficulty in walking, not elsewhere classified: Secondary | ICD-10-CM | POA: Diagnosis not present

## 2023-11-05 DIAGNOSIS — R2681 Unsteadiness on feet: Secondary | ICD-10-CM

## 2023-11-05 DIAGNOSIS — M6281 Muscle weakness (generalized): Secondary | ICD-10-CM

## 2023-11-05 DIAGNOSIS — M25552 Pain in left hip: Secondary | ICD-10-CM | POA: Diagnosis not present
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
## 2023-11-10 ENCOUNTER — Ambulatory Visit: Admitting: Rehabilitative and Restorative Service Providers"

## 2023-11-10 ENCOUNTER — Encounter: Payer: Self-pay | Admitting: Rehabilitative and Restorative Service Providers"

## 2023-11-10 DIAGNOSIS — R262 Difficulty in walking, not elsewhere classified: Secondary | ICD-10-CM | POA: Diagnosis not present

## 2023-11-10 DIAGNOSIS — M25552 Pain in left hip: Secondary | ICD-10-CM | POA: Diagnosis not present

## 2023-11-10 DIAGNOSIS — R2681 Unsteadiness on feet: Secondary | ICD-10-CM
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
## 2023-11-11 ENCOUNTER — Ambulatory Visit: Admitting: Physical Medicine and Rehabilitation

## 2023-11-11 ENCOUNTER — Encounter: Payer: Self-pay | Admitting: Physical Medicine and Rehabilitation

## 2023-11-11 DIAGNOSIS — M5442 Lumbago with sciatica, left side: Secondary | ICD-10-CM

## 2023-11-11 DIAGNOSIS — G8929 Other chronic pain: Secondary | ICD-10-CM

## 2023-11-11 DIAGNOSIS — M5441 Lumbago with sciatica, right side: Secondary | ICD-10-CM | POA: Diagnosis not present

## 2023-11-11 DIAGNOSIS — M5416 Radiculopathy, lumbar region: Secondary | ICD-10-CM

## 2023-11-11 DIAGNOSIS — M5116 Intervertebral disc disorders with radiculopathy, lumbar region: Secondary | ICD-10-CM

## 2023-11-11 MED ORDER — BACLOFEN 10 MG PO TABS: 10.0000 mg | ORAL_TABLET | Freq: Two times a day (BID) | ORAL | 0 refills | Status: AC

## 2023-11-11 NOTE — Progress Notes (Unsigned)
 Pain Scale   Average Pain 8 Patient advising she had lower back pain radiating bilaterally to both hip areas        +Driver, -BT, -Dye Allergies.

## 2023-11-11 NOTE — Progress Notes (Unsigned)
 Felicia [name] - 83 y.o. female MRN [id number]  Date of birth: 06-Oct-1940  Office Visit Note: Visit Date: 11/11/2023 PCP: Perley Bradley, MD Referred by: Perley Bradley, MD  Subjective: Chief Complaint  Patient presents with   Lower Back - Pain   HPI: Felicia [name] is a 83 y.o. female who comes in today for evaluation of chronic, worsening and severe bilateral lower back pain radiating to anterolateral thighs, right greater than left. Patient is well known to us . Pain ongoing for several years, worsened over the last several months. Her pain worsens with movement, activity and walking. She describes her pain as sore, aching and cramping sensation, currently rates as 8 out of 10. Some relief of pain with home exercise regimen, rest and use of OTC medications as needed. She recently completed short course of formal physical therapy with some relief of pain. Patient states she is a very active person and recently joined [employer], states she walks on treadmill and uses recombinant bike, also attends exercise classes and [employer]. Patients lumbar MRI from 2022 exhibits multi-level facet hypertrophy, superiorly migrating left paracentral extrusion impinging on the descending L4 nerve root. Disc material and facet spurring also impinges on the descending left L5 nerve root. No high grade spinal canal stenosis.  Patient  right L4 transforaminal epidural steroid injection performed in our office on 09/19/2021 and reports greater than 80% relief of pain with this procedure. Patient denies focal weakness. No recent trauma or falls.   Of note, she was involved in [facility] April of 2024, she reports issues with post concussive syndrome and balance issues post accident. She is currently managed by Dr. Rommie Coats with [facility]. She does have history of polyneuropathy, currently taking Gabapentin . She is also currently treated by Dr. Bertrand Brod for rheumatoid arthritis, osteoporosis. She  is planning to have new bone density testing soon.      Review of Systems  Musculoskeletal:  Positive for back pain, joint pain and myalgias.  Neurological:  Positive for tingling. Negative for focal weakness and weakness.  All other systems reviewed and are negative.  Otherwise per HPI.  Assessment & Plan: Visit Diagnoses:    ICD-10-CM   1. Chronic bilateral low back pain with bilateral sciatica  M54.42 Ambulatory referral to Physical Medicine Rehab   M54.41    G89.29     2. Lumbar radiculopathy  M54.16 Ambulatory referral to Physical Medicine Rehab    3. Intervertebral disc disorders with radiculopathy, lumbar region  M51.16 Ambulatory referral to Physical Medicine Rehab       Plan: Findings:  Chronic, worsening and severe bilateral lower back pain radiating to anterolateral thighs, right greater than left. She continues to have severe pain despite good conservative therapies such as formal physical therapy, home exercise regimen, rest and use of medications. Patients clinical presentation and exam are consistent with L4 nerve pattern. There is left paracentral extrusion at L4-L5 with superior migration and impingement on the left L4 and L5 nerve roots. There is multilevel disc and facet degeneration with dextroscoliosis and L3-L4, L4-L5 anterolisthesis. She has done well with intermittent lumbar epidural steroid injections over the years. We discussed treatment plan in detail today. Next step is to perform diagnostic and hopefully therapeutic bilateral L4 transforaminal epidural steroid injection under fluoroscopic guidance. If good relief of pain with injection we can repeat this procedure infrequently as needed. Should her pain continue would be quick to obtain new lumbar MRI imaging. No red flag symptoms  noted upon exam today.      Meds & Orders:  Meds ordered this encounter  Medications   baclofen (LIORESAL) 10 MG tablet    Sig: Take 1 tablet (10 mg total) by mouth 2 (two)  times daily.    Dispense:  30 each    Refill:  0    Orders Placed This Encounter  Procedures   Ambulatory referral to Physical Medicine Rehab    Follow-up: Return for Bilateral L4 tranforaminal epidural steroid injection.   Procedures: No procedures performed      Clinical History: EXAM: MRI LUMBAR SPINE WITHOUT CONTRAST   TECHNIQUE: Multiplanar, multisequence MR imaging of the lumbar spine was performed. No intravenous contrast was administered.   COMPARISON:  Radiography 05/24/2019   FINDINGS: Segmentation:  5 lumbar type vertebrae based on prior radiography   Alignment: Dextroscoliosis. Grade 1 anterolisthesis at L3-4 and L4-5.   Vertebrae: Mild discogenic marrow signal at L2-3, L4-5, and L5-S1. Mild left facet marrow edema at L4-5.   Conus medullaris and cauda equina: Conus extends to the L1-2 level. Conus and cauda equina appear normal.   Paraspinal and other soft tissues: Negative for perispinal mass or inflammation. There is a persistent hypointensity at the level of the minimally covered uterus suggesting fibroid.   Disc levels:   T12- L1: Unremarkable.   L1-L2: Unremarkable.   L2-L3: Disc collapse asymmetric to the left with endplate ridging and disc bulging. Mild left facet spurring.   L3-L4: Disc narrowing and bulging with mild facet spurring.   L4-L5: Facet osteoarthritis with spurring and mild anterolisthesis. Disc space narrowing and bulging with superiorly migrating left paracentral extrusion reaching the upper half of the L4 vertebral body and impinging on the descending L4 nerve root. Disc material and facet spurring also impinges on the descending left L5 nerve root.   L5-S1:Disc narrowing and mild bulging. Minor facet spurring. No neural compression.   IMPRESSION: 1. Multilevel disc and facet degeneration with levoscoliosis and L3-4, L4-5 anterolisthesis. 2. L4-5 left paracentral extrusion with superior migration and impingement on  the left L4 and L5 nerve roots. No other neural impingement, suspect this is the symptomatic finding. 3. Active facet arthritis on the left at L4-5.     Electronically Signed   By: Aleta Hutch M.D.   On: 07/19/2020 07:49   She reports that she quit smoking about 52 years ago. Her smoking use included cigarettes. She started smoking about 57 years ago. She has a 2.5 pack-year smoking history. She has never been exposed to tobacco smoke. She has never used smokeless tobacco. No results for input(s): HGBA1C, LABURIC in the last 8760 hours.  Objective:  VS:  HT:    WT:   BMI:     BP:   HR: bpm  TEMP: ( )  RESP:  Physical Exam Vitals and nursing note reviewed.  HENT:     Head: Normocephalic and atraumatic.     Right Ear: External ear normal.     Left Ear: External ear normal.     Nose: Nose normal.     Mouth/Throat:     Mouth: Mucous membranes are moist.  Eyes:     Extraocular Movements: Extraocular movements intact.  Cardiovascular:     Rate and Rhythm: Normal rate.     Pulses: Normal pulses.  Pulmonary:     Effort: Pulmonary effort is normal.  Abdominal:     General: Abdomen is flat. There is no distension.  Musculoskeletal:  General: Tenderness present.     Cervical back: Normal range of motion.     Comments: Patient is slow to rise from seated position to standing. Good lumbar range of motion. No pain noted with facet loading. 5/5 strength noted with bilateral hip flexion, knee flexion/extension, ankle dorsiflexion/plantarflexion and EHL. No clonus noted bilaterally. No pain upon palpation of greater trochanters. No pain with internal/external rotation of bilateral hips. Sensation intact bilaterally. Dysesthesias noted to bilateral L4 dermatomes. Negative slump test bilaterally. Ambulates without aid, gait steady.     Skin:    General: Skin is warm and dry.     Capillary Refill: Capillary refill takes less than 2 seconds.  Neurological:     General: No focal  deficit present.     Mental Status: She is alert and oriented to person, place, and time.  Psychiatric:        Mood and Affect: Mood normal.        Behavior: Behavior normal.     Ortho Exam  Imaging: No results found.  Past Medical/Family/Surgical/Social History: Medications & Allergies reviewed per EMR, new medications updated. Patient Active Problem List   Diagnosis Date Noted   Right-sided low back pain without sciatica 06/21/2022   Coronary artery disease involving native coronary artery with angina pectoris (HCC) 04/08/2022   Pulmonary HTN (HCC) 04/08/2022   Shortness of breath 04/08/2022   Greater trochanteric pain syndrome of right lower extremity 10/23/2021   Age-related osteoporosis without current pathological fracture 06/26/2021   Aplastic anemia (HCC) 06/26/2021   Hiatal hernia 06/26/2021   Prediabetes 06/26/2021   Neuropathy 06/16/2020   SIADH (syndrome of inappropriate ADH production) (HCC) 02/18/2020   Hyponatremia 11/18/2019   Thrombocytosis 08/01/2018   URI (upper respiratory infection) 04/14/2017   Primary osteoarthritis of both knees 03/06/2017   Exertional chest pain    Pain in the chest 03/27/2015   Postural hypotension 05/26/2013   OSA (obstructive sleep apnea) 03/22/2013   Lens replaced by other means 08/25/2012   Low tension glaucoma 02/14/2012   Osteoarthritis 11/07/2011   Abdominal bruit 10/16/2011   Essential hypertension 08/13/2011   Anisocoria 08/13/2011   Cervical radiculopathy 07/17/2011   DDD (degenerative disc disease), cervical 07/10/2011   Glaucoma 06/06/2011   HIATAL HERNIA 01/30/2010   POSTMENOPAUSAL SYNDROME 12/14/2009   EUSTACHIAN TUBE DYSFUNCTION, RIGHT 06/28/2009   GERD 12/07/2008   Rheumatoid arthritis (HCC) 12/07/2008   History of colonic polyps 12/07/2008   Hyperlipidemia LDL goal <70 10/17/2006   HYPERCALCEMIA 10/17/2006   Mitral valve disease 10/17/2006   Past Medical History:  Diagnosis Date   Adenomatous colon  polyp 2004   Colo in 2009 no polyps   Complication of anesthesia    small airway per patient   Concussion    Echocardiogram    Echo 8/22 EF 60-65, no RWMA, mild LVH, normal RVSF, moderately elevated PASP (RVSP 50.9), trivial MR, mild AI, AV sclerosis without stenosis   GERD (gastroesophageal reflux disease)    Glaucoma    Hiatal hernia    Hypertension    Neuropathy    Nuclear stress test    Myoview  8/22: EF 65, no ischemia; low risk   Rheumatoid arthritis(714.0)    Orencia ; Methotrexate  (Dr. Georgiann Kirsch)   Sleep apnea    on CPAP   Family History  Problem Relation Age of Onset   Dementia Mother    Lung disease Mother        bronchiectasis   Heart disease Mother  Aortic Stenosis   Osteoporosis Mother    Heart attack Father 13       S/P CBAG   Ulcerative colitis Brother    Stroke Maternal Grandmother 60   Diabetes Neg Hx    Cancer Neg Hx    Colon cancer Neg Hx    Esophageal cancer Neg Hx    Stomach cancer Neg Hx    Pancreatic cancer Neg Hx    Liver disease Neg Hx    Past Surgical History:  Procedure Laterality Date   CARDIAC CATHETERIZATION N/A 03/29/2015   Procedure: Left Heart Cath and Coronary Angiography;  Surgeon: Arty Binning, MD;  [facility] INVASIVE CV LAB;  Service: Cardiovascular;  Laterality: N/A;   CATARACT EXTRACTION, BILATERAL     COLONOSCOPY W/ POLYPECTOMY     X1; negative subsequently   DILATION AND CURETTAGE OF UTERUS     EYE SURGERY     due to RA   EYE SURGERY Left 06/2017   eyelid tendon    HYSTEROSCOPY WITH D & C  06/24/2012   Procedure: DILATATION AND CURETTAGE /HYSTEROSCOPY;  Surgeon: Oddis Bench, MD;  Location: [facility];  Service: Gynecology;  Laterality: N/A;   SHOULDER SURGERY Left    due to RA   SQUAMOUS CELL CARCINOMA EXCISION     scalp and left leg   TRABECULECTOMY     X 2 OD; X 1 OS   UPPER GASTROINTESTINAL ENDOSCOPY     hiatal hernia   Social History   Occupational History   Occupation: retired    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)   Tobacco Use   Smoking status: Former    Current packs/day: 0.00    Average packs/day: 0.5 packs/day for 5.0 years (2.5 ttl pk-yrs)    Types: Cigarettes    Start date: 06/03/1966    Quit date: 06/04/1971    Years since quitting: 52.4    Passive exposure: Never   Smokeless tobacco: Never  Vaping Use   Vaping status: Never Used  Substance and Sexual Activity   Alcohol use: Never   Drug use: No   Sexual activity: Yes    Birth control/protection: Post-menopausal

## 2023-11-14 ENCOUNTER — Ambulatory Visit: Attending: Cardiovascular Disease | Admitting: Pharmacist

## 2023-11-14 ENCOUNTER — Encounter: Payer: Self-pay | Admitting: Pharmacist

## 2023-11-14 ENCOUNTER — Other Ambulatory Visit (HOSPITAL_COMMUNITY): Payer: Self-pay

## 2023-11-14 VITALS — BP 145/72 | HR 72

## 2023-11-14 DIAGNOSIS — I10 Essential (primary) hypertension: Secondary | ICD-10-CM | POA: Diagnosis not present

## 2023-11-14 MED ORDER — LOSARTAN POTASSIUM-HCTZ 100-25 MG PO TABS
1.0000 | ORAL_TABLET | Freq: Every day | ORAL | 3 refills | Status: DC
  Filled 2023-11-14: qty 90, 90d supply, fill #0

## 2023-11-14 MED ORDER — LOSARTAN POTASSIUM-HCTZ 100-25 MG PO TABS
1.0000 | ORAL_TABLET | Freq: Every day | ORAL | 3 refills | Status: AC
Start: 2023-11-14 — End: ?

## 2023-11-14 NOTE — Progress Notes (Signed)
 Patient ID: Felicia [name]                 DOB: 1941/05/20                      MRN: [id number]      HPI: Felicia [name] is a 83 y.o. female referred by Dr. Paulita Boss  to HTN clinic. PMH is significant for pulmonary hypertension, mitral valve disease, HTN, CAD, HLD.  Last time when patient saw Dr.Chandrasekhar, in office BP was elevated but patient reported she gets anxious at doctor's visit  Patient was put on Clonidine for uncontrolled BP by her PCP. She had cholinergic s/e from it. So patient was tapered off of clonidine and advised to keep log and follow up with PharmD. At last visit home BP monitor was validated and education provided on how to measure BP correctly at home.    The patient presented today accompanied by her good friend. She reports that her blood pressure readings at home vary significantly. Since her last visit, she has taken clonidine only once.She checks her BP in the morning, but reports that her busy schedule with multiple OV appointments often leaves her feeling rushed, which may contribute to elevated readings. She also reports experiencing chronic pain, primarily due to neuropathy. Recently, she was prescribed baclofen , which she finds helpful in relaxing at night and improving how she feels the next day. The patient reports a sensation of possible fluid retention, though she denies any visible swelling. She also has a history of urinary incontinence and was recently prescribed vibegron, which she reports is helping.    Current HTN meds: losartan  100 mg daily and metoprolol  25 mg twice daily.  Previously tried: clonidine - anticholinergic s/e  BP goal: <130/80  Family History:      Relation Problem Comments  Mother (Deceased) Dementia   Heart disease Aortic Stenosis  Lung disease bronchiectasis  Osteoporosis     Father (Deceased) Heart attack (Age: 49) S/P CBAG    Brother Metallurgist) Ulcerative colitis     Brother (Deceased)   Maternal Grandmother (Deceased)  Stroke (Age: 33)     Maternal Grandfather (Deceased)   Paternal Grandmother (Deceased)   Paternal Grandfather (Deceased)      Social History:  Alcohol: none  Smoking:none  Diet: low salt diet   Exercise: none due to peripheral neuropathy, stays active around the house   Home BP readings: ~125/82    Wt Readings from Last 3 Encounters:  09/25/23 135 lb (61.2 kg)  09/01/23 135 lb 3.2 oz (61.3 kg)  08/20/23 140 lb 12.8 oz (63.9 kg)   BP Readings from Last 3 Encounters:  11/14/23 (!) 145/72  09/25/23 138/66  09/19/23 128/66   Pulse Readings from Last 3 Encounters:  11/14/23 72  09/25/23 75  09/19/23 80    Renal function: CrCl cannot be calculated (Patient's most recent lab result is older than the maximum 21 days allowed.).  Past Medical History:  Diagnosis Date   Adenomatous colon polyp 2004   Colo in 2009 no polyps   Complication of anesthesia    small airway per patient   Concussion    Echocardiogram    Echo 8/22 EF 60-65, no RWMA, mild LVH, normal RVSF, moderately elevated PASP (RVSP 50.9), trivial MR, mild AI, AV sclerosis without stenosis   GERD (gastroesophageal reflux disease)    Glaucoma    Hiatal hernia    Hypertension    Neuropathy    Nuclear  stress test    Myoview  8/22: EF 65, no ischemia; low risk   Rheumatoid arthritis(714.0)    Orencia ; Methotrexate  (Dr. Georgiann Kirsch)   Sleep apnea    on CPAP    Current Outpatient Medications on File Prior to Visit  Medication Sig Dispense Refill   Apoaequorin (PREVAGEN PO) Take 1 tablet by mouth daily.     Artificial Tear Solution (GENTEAL TEARS OP) Apply to eye.     baclofen  (LIORESAL ) 10 MG tablet Take 1 tablet (10 mg total) by mouth 2 (two) times daily. 30 each 0   Bempedoic Acid-Ezetimibe  (NEXLIZET ) 180-10 MG TABS TAKE 1 TABLET BY MOUTH DAILY 90 tablet 3   Carboxymethylcellulose Sodium (REFRESH OP) Apply 1 drop to eye daily as needed (FOR DRY EYES).     cloNIDine (CATAPRES) 0.1 MG tablet Take 1 tablet  (0.1 mg total) by mouth daily as needed. If SBP >=180     erythromycin ophthalmic ointment 1 Application at bedtime.     esomeprazole  (NEXIUM ) 40 MG capsule TAKE 1 CAPSULE BY MOUTH TWICE DAILY BEFORE MEALS 180 capsule 1   estradiol  (ESTRACE ) 1 MG tablet Take 1 mg by mouth daily.  0   FeFum-FePoly-FA-B Cmp-C-Biot (INTEGRA PLUS ) CAPS TAKE ONE CAPSULE BY MOUTH DAILY 30 capsule 3   folic acid  (FOLVITE ) 1 MG tablet TAKE 2 TABLETS(2 MG) BY MOUTH DAILY 180 tablet 3   gabapentin  (NEURONTIN ) 100 MG capsule Take 3 capsules (300 mg total) by mouth at bedtime. 270 capsule 3   isosorbide  mononitrate (IMDUR ) 30 MG 24 hr tablet TAKE 1 TABLET BY MOUTH EVERY DAY 90 tablet 3   latanoprost  (XALATAN ) 0.005 % ophthalmic solution Place 1 drop into the left eye at bedtime.     methotrexate  (RHEUMATREX) 2.5 MG tablet TAKE 4 TABLETS BY MOUTH 1 TIME A WEEK 48 tablet 0   metoprolol  tartrate (LOPRESSOR ) 25 MG tablet TAKE 1 TABLET(25 MG) BY MOUTH TWICE DAILY 180 tablet 3   metroNIDAZOLE  (METROGEL ) 0.75 % gel Apply 1 application topically 2 (two) times daily.      Multiple Vitamins-Minerals (CENTRUM SILVER PO) Take 1 capsule by mouth daily.     Polyethylene Glycol 3350 (MIRALAX PO) Take 1 Dose by mouth as needed.     progesterone  (PROMETRIUM ) 200 MG capsule Take 200 mg by mouth daily.     RINVOQ  15 MG TB24 TAKE 1 TABLET BY MOUTH EVERY DAY 30 tablet 2   timolol  (TIMOPTIC ) 0.5 % ophthalmic solution Place 1 drop into both eyes daily. One drop in left eye every morning One drop in right eye every evening     valACYclovir  (VALTREX ) 1000 MG tablet Take 1,000 mg by mouth as needed (fever blister). Reported on 06/14/2015     Vibegron (GEMTESA) 75 MG TABS Take 1 tablet by mouth daily.     Wheat Dextrin (BENEFIBER DRINK MIX PO) Take 1 Scoop by mouth at bedtime.     Zoledronic  Acid (RECLAST  IV) Inject into the vein once. Once a year (Patient not taking: Reported on 09/25/2023)     No current facility-administered medications on file  prior to visit.    Allergies  Allergen Reactions   Sulfonamide Derivatives     Rash, redness   Amlodipine  Swelling   Norvasc  [Amlodipine  Besylate]     edema    Blood pressure (!) 145/72, pulse 72.   Assessment/Plan:  1. Hypertension -  Essential hypertension Assessment: BP is uncontrolled in office BP 145/72 mmHg heart rate 72 (goal <130/80) Home BP ~140-150/70-75 range new  meter reads accurately  Tolerates current BP meds well without any side effects Denies SOB, palpitation, chest pain, headaches,or swelling Took clonidine 0.1 mg only once in last few weeks  Given elevated BP, both at home and during OV, will consider adding thiazide diuretics   Plan:  Start taking losartan  100/25 mg every morning and discontinue losartan  100 mg Continue taking metoprolol  25 mg twice daily  Patient to keep record of BP readings with heart rate and report to us  at the next visit Patient to see PharmD in 4 weeks for follow up  Follow up lab(s): 2 weeks     Thank you  Nickola Baron, Pharm.D [facility] Division of [facility] [address], [place], [place] [address]  Phone: [phone]; Fax: [phone]

## 2023-11-14 NOTE — Addendum Note (Signed)
 Addended by: Thandiwe Siragusa K on: 11/14/2023 03:53 PM   Modules accepted: Orders

## 2023-11-14 NOTE — Patient Instructions (Signed)
 Changes made by your pharmacist Nickola Baron, PharmD at today's visit:    Instructions/Changes  (what do you need to do) Your Notes  (what you did and when you did it)  Stop taking losartan  100 mg daily and start taking losartan  hydrochlorothiazide 100/25 mg every morning and continue taking metoprolol  25 mg twice daily    Get BMP- lab in 2 weeks of starting this new medication     Bring all of your meds, your BP cuff and your record of home blood pressures to your next appointment.    HOW TO TAKE YOUR BLOOD PRESSURE AT HOME  Rest 5 minutes before taking your blood pressure.  Don't smoke or drink caffeinated beverages for at least 30 minutes before. Take your blood pressure before (not after) you eat. Sit comfortably with your back supported and both feet on the floor (don't cross your legs). Elevate your arm to heart level on a table or a desk. Use the proper sized cuff. It should fit smoothly and snugly around your bare upper arm. There should be enough room to slip a fingertip under the cuff. The bottom edge of the cuff should be 1 inch above the crease of the elbow. Ideally, take 3 measurements at one sitting and record the average.  Important lifestyle changes to control high blood pressure  Intervention  Effect on the BP  Lose extra pounds and watch your waistline Weight loss is one of the most effective lifestyle changes for controlling blood pressure. If you're overweight or obese, losing even a small amount of weight can help reduce blood pressure. Blood pressure might go down by about 1 millimeter of mercury (mm Hg) with each kilogram (about 2.2 pounds) of weight lost.  Exercise regularly As a general goal, aim for at least 30 minutes of moderate physical activity every day. Regular physical activity can lower high blood pressure by about 5 to 8 mm Hg.  Eat a healthy diet Eating a diet rich in whole grains, fruits, vegetables, and low-fat dairy products and low in saturated  fat and cholesterol. A healthy diet can lower high blood pressure by up to 11 mm Hg.  Reduce salt (sodium) in your diet Even a small reduction of sodium in the diet can improve heart health and reduce high blood pressure by about 5 to 6 mm Hg.  Limit alcohol One drink equals 12 ounces of beer, 5 ounces of wine, or 1.5 ounces of 80-proof liquor.  Limiting alcohol to less than one drink a day for women or two drinks a day for men can help lower blood pressure by about 4 mm Hg.   If you have any questions or concerns please use My Chart to send questions or call the office at [phone]

## 2023-11-14 NOTE — Assessment & Plan Note (Signed)
 Assessment: BP is uncontrolled in office BP 145/72 mmHg heart rate 72 (goal <130/80) Home BP ~140-150/70-75 range new meter reads accurately  Tolerates current BP meds well without any side effects Denies SOB, palpitation, chest pain, headaches,or swelling Took clonidine 0.1 mg only once in last few weeks  Given elevated BP, both at home and during OV, will consider adding thiazide diuretics   Plan:  Start taking losartan  100/25 mg every morning and discontinue losartan  100 mg Continue taking metoprolol  25 mg twice daily  Patient to keep record of BP readings with heart rate and report to us  at the next visit Patient to see PharmD in 4 weeks for follow up  Follow up lab(s): 2 weeks

## 2023-11-16 DIAGNOSIS — G4733 Obstructive sleep apnea (adult) (pediatric): Secondary | ICD-10-CM | POA: Diagnosis not present

## 2023-11-18 ENCOUNTER — Telehealth: Payer: Self-pay

## 2023-11-18 ENCOUNTER — Telehealth: Payer: Self-pay | Admitting: Physical Medicine and Rehabilitation

## 2023-11-18 DIAGNOSIS — B029 Zoster without complications: Secondary | ICD-10-CM | POA: Diagnosis not present

## 2023-11-18 NOTE — Telephone Encounter (Signed)
 Patient called stating she has blisters on her side after taking the muscle relaxer given She said that it hurts for even her shirt to touch it. I told her it could be shingles But she states she wants to talk to you and ask what you think she needs to do

## 2023-11-18 NOTE — Telephone Encounter (Addendum)
 Patient reports she developed linear type rash to left side of abdomen on Sunday. She is concerned this rash is related to Baclofen , she did stop taking Baclofen , reports rash has increased over the last several days. Now reports severe burning and pain when wearing clothing. I encouraged patient to reach out to PCP as this rash does sound like shingles. Likely unrelated to Baclofen .

## 2023-11-20 DIAGNOSIS — D045 Carcinoma in situ of skin of trunk: Secondary | ICD-10-CM | POA: Diagnosis not present

## 2023-11-21 ENCOUNTER — Other Ambulatory Visit: Payer: Self-pay | Admitting: *Deleted

## 2023-11-21 DIAGNOSIS — Z79899 Other long term (current) drug therapy: Secondary | ICD-10-CM | POA: Diagnosis not present

## 2023-11-21 DIAGNOSIS — Z111 Encounter for screening for respiratory tuberculosis: Secondary | ICD-10-CM

## 2023-11-23 ENCOUNTER — Ambulatory Visit: Payer: Self-pay | Admitting: Physician Assistant

## 2023-11-23 NOTE — Progress Notes (Signed)
 Creatinine is elevated-1.09 and GFR is low-51. Avoid the use of NSAIDs.  Sodium remains low--128. Please forward to PCP.  RBC count, hgb, and hct are low--improved.   Platelet count is elevated-443K-stable. We will continue to monitor.

## 2023-11-25 LAB — CBC WITH DIFFERENTIAL/PLATELET
Absolute Lymphocytes: 1219 {cells}/uL (ref 850–3900)
Absolute Monocytes: 891 {cells}/uL (ref 200–950)
Basophils Absolute: 47 {cells}/uL (ref 0–200)
Basophils Relative: 0.7 %
Eosinophils Absolute: 87 {cells}/uL (ref 15–500)
Eosinophils Relative: 1.3 %
HCT: 33.5 % — ABNORMAL LOW (ref 35.0–45.0)
Hemoglobin: 11 g/dL — ABNORMAL LOW (ref 11.7–15.5)
MCH: 33 pg (ref 27.0–33.0)
MCHC: 32.8 g/dL (ref 32.0–36.0)
MCV: 100.6 fL — ABNORMAL HIGH (ref 80.0–100.0)
MPV: 9.2 fL (ref 7.5–12.5)
Monocytes Relative: 13.3 %
Neutro Abs: 4456 {cells}/uL (ref 1500–7800)
Neutrophils Relative %: 66.5 %
Platelets: 443 10*3/uL — ABNORMAL HIGH (ref 140–400)
RBC: 3.33 10*6/uL — ABNORMAL LOW (ref 3.80–5.10)
RDW: 13.1 % (ref 11.0–15.0)
Total Lymphocyte: 18.2 %
WBC: 6.7 10*3/uL (ref 3.8–10.8)

## 2023-11-25 LAB — QUANTIFERON-TB GOLD PLUS
Mitogen-NIL: 1.02 [IU]/mL
NIL: 0.04 [IU]/mL
QuantiFERON-TB Gold Plus: NEGATIVE
TB1-NIL: 0 [IU]/mL
TB2-NIL: 0 [IU]/mL

## 2023-11-25 LAB — COMPREHENSIVE METABOLIC PANEL WITH GFR
AG Ratio: 2.1 (calc) (ref 1.0–2.5)
ALT: 20 U/L (ref 6–29)
AST: 27 U/L (ref 10–35)
Albumin: 4.2 g/dL (ref 3.6–5.1)
Alkaline phosphatase (APISO): 45 U/L (ref 37–153)
BUN/Creatinine Ratio: 17 (calc) (ref 6–22)
BUN: 19 mg/dL (ref 7–25)
CO2: 23 mmol/L (ref 20–32)
Calcium: 9.6 mg/dL (ref 8.6–10.4)
Chloride: 97 mmol/L — ABNORMAL LOW (ref 98–110)
Creat: 1.09 mg/dL — ABNORMAL HIGH (ref 0.60–0.95)
Globulin: 2 g/dL (ref 1.9–3.7)
Glucose, Bld: 85 mg/dL (ref 65–99)
Potassium: 4.8 mmol/L (ref 3.5–5.3)
Sodium: 128 mmol/L — ABNORMAL LOW (ref 135–146)
Total Bilirubin: 0.4 mg/dL (ref 0.2–1.2)
Total Protein: 6.2 g/dL (ref 6.1–8.1)
eGFR: 51 mL/min/{1.73_m2} — ABNORMAL LOW (ref >=60)

## 2023-11-25 NOTE — Progress Notes (Signed)
 TB gold negative

## 2023-11-28 DIAGNOSIS — I10 Essential (primary) hypertension: Secondary | ICD-10-CM | POA: Diagnosis not present

## 2023-11-29 LAB — BASIC METABOLIC PANEL WITH GFR
BUN/Creatinine Ratio: 29 — ABNORMAL HIGH (ref 12–28)
BUN: 27 mg/dL (ref 8–27)
CO2: 17 mmol/L — ABNORMAL LOW (ref 20–29)
Calcium: 10 mg/dL (ref 8.7–10.3)
Chloride: 96 mmol/L (ref 96–106)
Creatinine, Ser: 0.94 mg/dL (ref 0.57–1.00)
Glucose: 98 mg/dL (ref 70–99)
Potassium: 4.7 mmol/L (ref 3.5–5.2)
Sodium: 128 mmol/L — ABNORMAL LOW (ref 134–144)
eGFR: 61 mL/min/{1.73_m2} (ref >59)

## 2023-12-01 ENCOUNTER — Ambulatory Visit: Payer: Self-pay | Admitting: Pharmacist

## 2023-12-01 NOTE — Telephone Encounter (Signed)
 Result discussed over the phone. Tolerates diuretics well. Given low sodium advise to increase add some salt to food.

## 2023-12-02 NOTE — Telephone Encounter (Signed)
 BMP discussed over the phone

## 2023-12-15 ENCOUNTER — Ambulatory Visit: Admitting: Physical Medicine and Rehabilitation

## 2023-12-15 ENCOUNTER — Other Ambulatory Visit: Payer: Self-pay

## 2023-12-15 VITALS — BP 173/83 | HR 76

## 2023-12-15 DIAGNOSIS — M5416 Radiculopathy, lumbar region: Secondary | ICD-10-CM | POA: Diagnosis not present

## 2023-12-15 MED ORDER — METHYLPREDNISOLONE ACETATE 40 MG/ML IJ SUSP
40.0000 mg | Freq: Once | INTRAMUSCULAR | Status: AC
Start: 2023-12-15 — End: 2023-12-15
  Administered 2023-12-15: 40 mg

## 2023-12-15 NOTE — Patient Instructions (Signed)

## 2023-12-15 NOTE — Procedures (Unsigned)
 Lumbosacral Transforaminal Epidural Steroid Injection - Sub-Pedicular Approach with Fluoroscopic Guidance  Patient: Felicia [name]      Date of Birth: 13-Dec-1940 MRN: [id number] PCP: Cleotilde Planas, MD      Visit Date: 12/15/2023   Universal Protocol:    Date/Time: 12/15/2023  Consent Given By: the patient  Position: PRONE  Additional Comments: Vital signs were monitored before and after the procedure. Patient was prepped and draped in the usual sterile fashion. The correct patient, procedure, and site was verified.   Injection Procedure Details:   Procedure diagnoses: Lumbar radiculopathy [M54.16]    Meds Administered:  Meds ordered this encounter  Medications   methylPREDNISolone  acetate (DEPO-MEDROL ) injection 40 mg    Laterality: Bilateral  Location/Site: L4  Needle:5.0 in., 22 ga.  Short bevel or Quincke spinal needle  Needle Placement: Transforaminal  Findings:    -Comments: Excellent flow of contrast along the nerve, nerve root and into the epidural space.  Procedure Details: After squaring off the end-plates to get a true AP view, the C-arm was positioned so that an oblique view of the foramen as noted above was visualized. The target area is just inferior to the nose of the scotty dog or sub pedicular. The soft tissues overlying this structure were infiltrated with 2-3 ml. of 1% Lidocaine  without Epinephrine.  The spinal needle was inserted toward the target using a trajectory view along the fluoroscope beam.  Under AP and lateral visualization, the needle was advanced so it did not puncture dura and was located close the 6 O'Clock position of the pedical in AP tracterory. Biplanar projections were used to confirm position. Aspiration was confirmed to be negative for CSF and/or blood. A 1-2 ml. volume of Isovue-250 was injected and flow of contrast was noted at each level. Radiographs were obtained for documentation purposes.   After attaining the desired flow  of contrast documented above, a 0.5 to 1.0 ml test dose of 0.25% Marcaine was injected into each respective transforaminal space.  The patient was observed for 90 seconds post injection.  After no sensory deficits were reported, and normal lower extremity motor function was noted,   the above injectate was administered so that equal amounts of the injectate were placed at each foramen (level) into the transforaminal epidural space.   Additional Comments:  The patient tolerated the procedure well Dressing: 2 x 2 sterile gauze and Band-Aid    Post-procedure details: Patient was observed during the procedure. Post-procedure instructions were reviewed.  Patient left the clinic in stable condition.

## 2023-12-15 NOTE — Progress Notes (Unsigned)
 Felicia [name] - 83 y.o. female MRN [id number]  Date of birth: [birth year]  Office Visit Note: Visit Date: 12/15/2023 PCP: Cleotilde Planas, MD Referred by: Cleotilde Planas, MD  Subjective: Chief Complaint  Patient presents with   Lower Back - Pain   HPI:  Felicia [name] is a 83 y.o. female who comes in today at the request of Duwaine Pouch, FNP for planned Bilateral L4-5 Lumbar Transforaminal epidural steroid injection with fluoroscopic guidance.  The patient has failed conservative care including home exercise, medications, time and activity modification.  This injection will be diagnostic and hopefully therapeutic.  Please see requesting physician notes for further details and justification.   ROS Otherwise per HPI.  Assessment & Plan: Visit Diagnoses:    ICD-10-CM   1. Lumbar radiculopathy  M54.16 XR C-ARM NO REPORT    Epidural Steroid injection    methylPREDNISolone  acetate (DEPO-MEDROL ) injection 40 mg      Plan: No additional findings.   Meds & Orders:  Meds ordered this encounter  Medications   methylPREDNISolone  acetate (DEPO-MEDROL ) injection 40 mg    Orders Placed This Encounter  Procedures   XR C-ARM NO REPORT   Epidural Steroid injection    Follow-up: Return for visit to requesting provider as needed.   Procedures: No procedures performed  Lumbosacral Transforaminal Epidural Steroid Injection - Sub-Pedicular Approach with Fluoroscopic Guidance  Patient: Felicia [name]      Date of Birth: 06/04/1940 MRN: [id number] PCP: Cleotilde Planas, MD      Visit Date: 12/15/2023   Universal Protocol:    Date/Time: 12/15/2023  Consent Given By: the patient  Position: PRONE  Additional Comments: Vital signs were monitored before and after the procedure. Patient was prepped and draped in the usual sterile fashion. The correct patient, procedure, and site was verified.   Injection Procedure Details:   Procedure diagnoses: Lumbar radiculopathy [M54.16]    Meds  Administered:  Meds ordered this encounter  Medications   methylPREDNISolone  acetate (DEPO-MEDROL ) injection 40 mg    Laterality: Bilateral  Location/Site: L4  Needle:5.0 in., 22 ga.  Short bevel or Quincke spinal needle  Needle Placement: Transforaminal  Findings:    -Comments: Excellent flow of contrast along the nerve, nerve root and into the epidural space.  Procedure Details: After squaring off the end-plates to get a true AP view, the C-arm was positioned so that an oblique view of the foramen as noted above was visualized. The target area is just inferior to the nose of the scotty dog or sub pedicular. The soft tissues overlying this structure were infiltrated with 2-3 ml. of 1% Lidocaine  without Epinephrine.  The spinal needle was inserted toward the target using a trajectory view along the fluoroscope beam.  Under AP and lateral visualization, the needle was advanced so it did not puncture dura and was located close the 6 O'Clock position of the pedical in AP tracterory. Biplanar projections were used to confirm position. Aspiration was confirmed to be negative for CSF and/or blood. A 1-2 ml. volume of Isovue-250 was injected and flow of contrast was noted at each level. Radiographs were obtained for documentation purposes.   After attaining the desired flow of contrast documented above, a 0.5 to 1.0 ml test dose of 0.25% Marcaine was injected into each respective transforaminal space.  The patient was observed for 90 seconds post injection.  After no sensory deficits were reported, and normal lower extremity motor function was noted,   the above injectate was administered so  that equal amounts of the injectate were placed at each foramen (level) into the transforaminal epidural space.   Additional Comments:  The patient tolerated the procedure well Dressing: 2 x 2 sterile gauze and Band-Aid    Post-procedure details: Patient was observed during the  procedure. Post-procedure instructions were reviewed.  Patient left the clinic in stable condition.    Clinical History: EXAM: MRI LUMBAR SPINE WITHOUT CONTRAST   TECHNIQUE: Multiplanar, multisequence MR imaging of the lumbar spine was performed. No intravenous contrast was administered.   COMPARISON:  Radiography 05/24/2019   FINDINGS: Segmentation:  5 lumbar type vertebrae based on prior radiography   Alignment: Dextroscoliosis. Grade 1 anterolisthesis at L3-4 and L4-5.   Vertebrae: Mild discogenic marrow signal at L2-3, L4-5, and L5-S1. Mild left facet marrow edema at L4-5.   Conus medullaris and cauda equina: Conus extends to the L1-2 level. Conus and cauda equina appear normal.   Paraspinal and other soft tissues: Negative for perispinal mass or inflammation. There is a persistent hypointensity at the level of the minimally covered uterus suggesting fibroid.   Disc levels:   T12- L1: Unremarkable.   L1-L2: Unremarkable.   L2-L3: Disc collapse asymmetric to the left with endplate ridging and disc bulging. Mild left facet spurring.   L3-L4: Disc narrowing and bulging with mild facet spurring.   L4-L5: Facet osteoarthritis with spurring and mild anterolisthesis. Disc space narrowing and bulging with superiorly migrating left paracentral extrusion reaching the upper half of the L4 vertebral body and impinging on the descending L4 nerve root. Disc material and facet spurring also impinges on the descending left L5 nerve root.   L5-S1:Disc narrowing and mild bulging. Minor facet spurring. No neural compression.   IMPRESSION: 1. Multilevel disc and facet degeneration with levoscoliosis and L3-4, L4-5 anterolisthesis. 2. L4-5 left paracentral extrusion with superior migration and impingement on the left L4 and L5 nerve roots. No other neural impingement, suspect this is the symptomatic finding. 3. Active facet arthritis on the left at L4-5.      Electronically Signed   By: Cassondra Roulette M.D.   On: 07/19/2020 07:49     Objective:  VS:  HT:    WT:   BMI:     BP:(!) 173/83  HR:76bpm  TEMP: ( )  RESP:  Physical Exam Vitals and nursing note reviewed.  Constitutional:      General: She is not in acute distress.    Appearance: Normal appearance. She is not ill-appearing.  HENT:     Head: Normocephalic and atraumatic.     Right Ear: External ear normal.     Left Ear: External ear normal.  Eyes:     Extraocular Movements: Extraocular movements intact.  Cardiovascular:     Rate and Rhythm: Normal rate.     Pulses: Normal pulses.  Pulmonary:     Effort: Pulmonary effort is normal. No respiratory distress.  Abdominal:     General: There is no distension.     Palpations: Abdomen is soft.  Musculoskeletal:        General: Tenderness present.     Cervical back: Neck supple.     Right lower leg: No edema.     Left lower leg: No edema.     Comments: Patient has good distal strength with no pain over the greater trochanters.  No clonus or focal weakness.  Skin:    Findings: No erythema, lesion or rash.  Neurological:     General: No focal deficit present.  Mental Status: She is alert and oriented to person, place, and time.     Sensory: No sensory deficit.     Motor: No weakness or abnormal muscle tone.     Coordination: Coordination normal.  Psychiatric:        Mood and Affect: Mood normal.        Behavior: Behavior normal.      Imaging: No results found.

## 2023-12-15 NOTE — Progress Notes (Unsigned)
 Pain Scale   Average Pain 8 Patient advising she has chronic lower back pain radiating bilaterally to hips. Patient advised that she usually doesn't get much relief.        +Driver, -BT, -Dye Allergies.

## 2023-12-17 DIAGNOSIS — M8588 Other specified disorders of bone density and structure, other site: Secondary | ICD-10-CM | POA: Diagnosis not present

## 2023-12-17 DIAGNOSIS — Z8262 Family history of osteoporosis: Secondary | ICD-10-CM | POA: Diagnosis not present

## 2023-12-17 DIAGNOSIS — M069 Rheumatoid arthritis, unspecified: Secondary | ICD-10-CM | POA: Diagnosis not present

## 2023-12-17 LAB — HM DEXA SCAN

## 2023-12-19 ENCOUNTER — Ambulatory Visit: Attending: Cardiovascular Disease | Admitting: Pharmacist

## 2023-12-19 ENCOUNTER — Encounter: Payer: Self-pay | Admitting: Pharmacist

## 2023-12-19 VITALS — BP 146/76 | HR 80

## 2023-12-19 DIAGNOSIS — I10 Essential (primary) hypertension: Secondary | ICD-10-CM | POA: Diagnosis not present

## 2023-12-19 NOTE — Progress Notes (Signed)
 Patient ID: Felicia [name]                 DOB: Aug 17, 1940                      MRN: [id number]      HPI: Felicia [name] is a 83 y.o. female referred by Dr. Santo  to HTN clinic. PMH is significant for pulmonary hypertension, mitral valve disease, HTN, CAD, HLD.  Last time when patient saw Dr.Chandrasekhar, in office BP was elevated but patient reported she gets anxious at doctor's visit  Patient was put on Clonidine for uncontrolled BP by her PCP. She had cholinergic s/e from it. So patient was tapered off of clonidine and advised to keep log and follow up with PharmD. At last visit home BP monitor was validated and education provided on how to measure BP correctly at home.  At last visit with me thiazide diuretics were added to ARB due to onffice and at home elevated BP. Post BMP showed some hyponatremia   The patient presented today accompanied by her good friend. She brought a home blood pressure log, with recent readings averaging approximately 118/62 mmHg and a heart rate of 66 bpm. She reports tolerating her thiazide diuretic well.  She has a history of low sodium and previously consulted an endocrinologist, who advised her to reduce water intake as the hyponatremia was attributed to excessive fluid consumption. She denies dizziness, shortness of breath, or palpitations.  Today, she is experiencing a moderate headache that has been bothering her since the morning. She typically manages headaches and other pain with Tylenol . She tries to stay active around the house.  Recent labs revealed a low hemoglobin level. In response, she is increasing her intake of iron-rich foods, primarily from plant-based sources.  She reports feeling stressed during medical visits due to headaches and anticipates elevated in-office blood pressure readings due to white coat syndrome. Current HTN meds: losartan  -hydrochlorothiazide  100/25 mg daily and metoprolol  25 mg twice daily, Imdur  SR 30 mg daily   Previously tried: clonidine - anticholinergic s/e  BP goal: <130/80  Family History:      Relation Problem Comments  Mother (Deceased) Dementia   Heart disease Aortic Stenosis  Lung disease bronchiectasis  Osteoporosis     Father (Deceased) Heart attack (Age: 63) S/P CBAG    Brother Metallurgist) Ulcerative colitis     Brother (Deceased)   Maternal Grandmother (Deceased) Stroke (Age: 52)     Maternal Grandfather (Deceased)   Paternal Grandmother (Deceased)   Paternal Grandfather (Deceased)      Social History:  Alcohol: none  Smoking:none  Diet: low salt diet   Exercise: none due to peripheral neuropathy, stays active around the house   Home BP readings:  SBP DBP hr   113 63 68  121 65 71  116 63 59  113 66 56  128 57 63  118 57 61  103 60 72  106 60 79  102  55 70  124 65   114 62   137 60   120 62   110 60   130 63   136 70   118.1875 61.75 66.55556     Wt Readings from Last 3 Encounters:  09/25/23 135 lb (61.2 kg)  09/01/23 135 lb 3.2 oz (61.3 kg)  08/20/23 140 lb 12.8 oz (63.9 kg)   BP Readings from Last 3 Encounters:  12/19/23 (!) 146/76  12/15/23 (!) 173/83  11/14/23 ROLLEN)  145/72   Pulse Readings from Last 3 Encounters:  12/19/23 80  12/15/23 76  11/14/23 72    Renal function: CrCl cannot be calculated (Patient's most recent lab result is older than the maximum 21 days allowed.).  Past Medical History:  Diagnosis Date   Adenomatous colon polyp 2004   Colo in 2009 no polyps   Complication of anesthesia    small airway per patient   Concussion    Echocardiogram    Echo 8/22 EF 60-65, no RWMA, mild LVH, normal RVSF, moderately elevated PASP (RVSP 50.9), trivial MR, mild AI, AV sclerosis without stenosis   GERD (gastroesophageal reflux disease)    Glaucoma    Hiatal hernia    Hypertension    Neuropathy    Nuclear stress test    Myoview  8/22: EF 65, no ischemia; low risk   Rheumatoid arthritis(714.0)    Orencia ; Methotrexate  (Dr.  Monna)   Sleep apnea    on CPAP    Current Outpatient Medications on File Prior to Visit  Medication Sig Dispense Refill   Apoaequorin (PREVAGEN PO) Take 1 tablet by mouth daily.     Artificial Tear Solution (GENTEAL TEARS OP) Apply to eye.     baclofen  (LIORESAL ) 10 MG tablet Take 1 tablet (10 mg total) by mouth 2 (two) times daily. 30 each 0   Bempedoic Acid-Ezetimibe  (NEXLIZET ) 180-10 MG TABS TAKE 1 TABLET BY MOUTH DAILY 90 tablet 3   Carboxymethylcellulose Sodium (REFRESH OP) Apply 1 drop to eye daily as needed (FOR DRY EYES).     cloNIDine (CATAPRES) 0.1 MG tablet Take 1 tablet (0.1 mg total) by mouth daily as needed. If SBP >=180     erythromycin ophthalmic ointment 1 Application at bedtime.     esomeprazole  (NEXIUM ) 40 MG capsule TAKE 1 CAPSULE BY MOUTH TWICE DAILY BEFORE MEALS 180 capsule 1   estradiol  (ESTRACE ) 1 MG tablet Take 1 mg by mouth daily.  0   FeFum-FePoly-FA-B Cmp-C-Biot (INTEGRA PLUS ) CAPS TAKE ONE CAPSULE BY MOUTH DAILY 30 capsule 3   folic acid  (FOLVITE ) 1 MG tablet TAKE 2 TABLETS(2 MG) BY MOUTH DAILY 180 tablet 3   gabapentin  (NEURONTIN ) 100 MG capsule Take 3 capsules (300 mg total) by mouth at bedtime. 270 capsule 3   isosorbide  mononitrate (IMDUR ) 30 MG 24 hr tablet TAKE 1 TABLET BY MOUTH EVERY DAY 90 tablet 3   latanoprost  (XALATAN ) 0.005 % ophthalmic solution Place 1 drop into the left eye at bedtime.     losartan -hydrochlorothiazide  (HYZAAR ) 100-25 MG tablet Take 1 tablet by mouth daily. 90 tablet 3   methotrexate  (RHEUMATREX) 2.5 MG tablet TAKE 4 TABLETS BY MOUTH 1 TIME A WEEK 48 tablet 0   metoprolol  tartrate (LOPRESSOR ) 25 MG tablet TAKE 1 TABLET(25 MG) BY MOUTH TWICE DAILY 180 tablet 3   metroNIDAZOLE  (METROGEL ) 0.75 % gel Apply 1 application topically 2 (two) times daily.      Multiple Vitamins-Minerals (CENTRUM SILVER PO) Take 1 capsule by mouth daily.     Polyethylene Glycol 3350 (MIRALAX PO) Take 1 Dose by mouth as needed.     progesterone   (PROMETRIUM ) 200 MG capsule Take 200 mg by mouth daily.     RINVOQ  15 MG TB24 TAKE 1 TABLET BY MOUTH EVERY DAY 30 tablet 2   timolol  (TIMOPTIC ) 0.5 % ophthalmic solution Place 1 drop into both eyes daily. One drop in left eye every morning One drop in right eye every evening     valACYclovir  (VALTREX ) 1000 MG tablet  Take 1,000 mg by mouth as needed (fever blister). Reported on 06/14/2015     Vibegron (GEMTESA) 75 MG TABS Take 1 tablet by mouth daily.     Wheat Dextrin (BENEFIBER DRINK MIX PO) Take 1 Scoop by mouth at bedtime.     Zoledronic  Acid (RECLAST  IV) Inject into the vein once. Once a year (Patient not taking: Reported on 09/25/2023)     No current facility-administered medications on file prior to visit.    Allergies  Allergen Reactions   Sulfonamide Derivatives     Rash, redness   Amlodipine  Swelling   Norvasc  [Amlodipine  Besylate]     edema    Blood pressure (!) 146/76, pulse 80.   Assessment/Plan:  1. Hypertension -  Essential hypertension Assessment: BP is uncontrolled in office BP 145/72 mmHg heart rate 72 (goal <130/80) Home BP ~ 118/62 heart rate 66 range new meter reads accurately  Tolerates current BP meds well without any side effects Denies SOB, palpitation, chest pain, headaches,or swelling Watches salt intake and stays active around the house   Plan:  Continue taking losartan  hydrochlorothiazide  100/25 mg daily and metoprolol  25 mg twice daily, Imdur  30 mg daily  Patient to keep record of BP readings with heart rate and report to us  at the next visit Patient advised to contact the office if home BP readings consistently rise above goal (>130/80 mmHg)     Thank you  Robbi Blanch, Pharm.D [facility] of [facility] [address] [place], [place] [address]  Phone: [phone]; Fax: [phone]

## 2023-12-19 NOTE — Patient Instructions (Signed)
 No Changes made by your pharmacist Robbi Blanch, PharmD at today's visit:  Goal BP is <130/80.   Bring all of your meds, your BP cuff and your record of home blood pressures to your next appointment.    HOW TO TAKE YOUR BLOOD PRESSURE AT HOME  Rest 5 minutes before taking your blood pressure.  Don't smoke or drink caffeinated beverages for at least 30 minutes before. Take your blood pressure before (not after) you eat. Sit comfortably with your back supported and both feet on the floor (don't cross your legs). Elevate your arm to heart level on a table or a desk. Use the proper sized cuff. It should fit smoothly and snugly around your bare upper arm. There should be enough room to slip a fingertip under the cuff. The bottom edge of the cuff should be 1 inch above the crease of the elbow. Ideally, take 3 measurements at one sitting and record the average.  Important lifestyle changes to control high blood pressure  Intervention  Effect on the BP  Lose extra pounds and watch your waistline Weight loss is one of the most effective lifestyle changes for controlling blood pressure. If you're overweight or obese, losing even a small amount of weight can help reduce blood pressure. Blood pressure might go down by about 1 millimeter of mercury (mm Hg) with each kilogram (about 2.2 pounds) of weight lost.  Exercise regularly As a general goal, aim for at least 30 minutes of moderate physical activity every day. Regular physical activity can lower high blood pressure by about 5 to 8 mm Hg.  Eat a healthy diet Eating a diet rich in whole grains, fruits, vegetables, and low-fat dairy products and low in saturated fat and cholesterol. A healthy diet can lower high blood pressure by up to 11 mm Hg.  Reduce salt (sodium) in your diet Even a small reduction of sodium in the diet can improve heart health and reduce high blood pressure by about 5 to 6 mm Hg.  Limit alcohol One drink equals 12 ounces of  beer, 5 ounces of wine, or 1.5 ounces of 80-proof liquor.  Limiting alcohol to less than one drink a day for women or two drinks a day for men can help lower blood pressure by about 4 mm Hg.   If you have any questions or concerns please use My Chart to send questions or call the office at [phone]

## 2023-12-19 NOTE — Assessment & Plan Note (Signed)
 Assessment: BP is uncontrolled in office BP 145/72 mmHg heart rate 72 (goal <130/80) Home BP ~ 118/62 heart rate 66 range new meter reads accurately  Tolerates current BP meds well without any side effects Denies SOB, palpitation, chest pain, headaches,or swelling Watches salt intake and stays active around the house   Plan:  Continue taking losartan  hydrochlorothiazide  100/25 mg daily and metoprolol  25 mg twice daily, Imdur  30 mg daily  Patient to keep record of BP readings with heart rate and report to us  at the next visit Patient advised to contact the office if home BP readings consistently rise above goal (>130/80 mmHg)

## 2023-12-24 ENCOUNTER — Other Ambulatory Visit: Payer: Self-pay | Admitting: Physician Assistant

## 2023-12-24 NOTE — Telephone Encounter (Signed)
 Last Fill: 08/27/2023  Labs: 11/21/2023 Creatinine is elevated-1.09 and GFR is low-51. Avoid the use of NSAIDs.  Sodium remains low--128. Please forward to PCP.  RBC count, hgb, and hct are low--improved.   Platelet count is elevated-443K-stable  Next Visit: 02/03/2024  Last Visit: 09/01/2023  DX: Rheumatoid arthritis involving multiple sites with positive rheumatoid factor   Current Dose per office note 09/01/2023: Methotrexate  4 tablets by mouth every 7 days   Okay to refill Methotrexate ?

## 2023-12-24 NOTE — Telephone Encounter (Signed)
 Renal function improved on 11/28/23. Ok to continue current dose of methotrexate .

## 2023-12-25 ENCOUNTER — Telehealth: Payer: Self-pay

## 2023-12-25 NOTE — Telephone Encounter (Signed)
 Received DEXA results from [facility].  Date of Scan: 12/17/2023  Lowest T-score:-1.4  BMD:0.607  Lowest site measured:Right Femoral   DX: Osteopenia  Significant changes in BMD and site measured (5% and above):N/A  Current Regimen: Prolia 11/2022, Vitamin D , Calcium    Recommendation:Resume Reclast  after next Dexa 2027  Reviewed by: Maya Nash   Next Appointment: 02/03/2024

## 2023-12-28 ENCOUNTER — Other Ambulatory Visit: Payer: Self-pay | Admitting: Physician Assistant

## 2023-12-28 DIAGNOSIS — M0579 Rheumatoid arthritis with rheumatoid factor of multiple sites without organ or systems involvement: Secondary | ICD-10-CM

## 2023-12-29 ENCOUNTER — Telehealth: Payer: Self-pay

## 2023-12-29 NOTE — Telephone Encounter (Signed)
 Last Fill: 09/29/2023  Labs: 11/21/2023 Creatinine is elevated-1.09 and GFR is low-51. Avoid the use of NSAIDs.  Sodium remains low--128. Please forward to PCP.  RBC count, hgb, and hct are low--improved.   Platelet count is elevated-443K-stable. We will continue to monitor.  08/11/2023 Lipid Panel LFTs normal, lipid panel normal.   TB Gold: 11/21/2023 TB gold negative   Next Visit: 02/03/2024  Last Visit: 09/01/2023  IK:Myzlfjunpi arthritis involving multiple sites with positive rheumatoid factor (HCC)   Current Dose per office note 09/01/2023: Rinvoq  15 mg 1 tablet by mouth daily   Okay to refill Rinvoq ?

## 2023-12-29 NOTE — Telephone Encounter (Signed)
 Received DEXA results from [facility] .  Date of Scan: 12/17/2023  Lowest T-score:-1.4  BMD:0.607  Lowest site measured:Right 1/3 Distal Radius  DX: Osteopenia  Significant changes in BMD and site measured (5% and above): N/A  Current Regimen: Reclast  11/2022   Recommendation:Resume reclast  after next DEXA 2027  Reviewed by: Maya Nash  Next Appointment: 02/03/2024

## 2024-01-01 ENCOUNTER — Encounter: Payer: Self-pay | Admitting: Physical Medicine and Rehabilitation

## 2024-01-02 ENCOUNTER — Encounter: Payer: Self-pay | Admitting: Physical Medicine and Rehabilitation

## 2024-01-02 ENCOUNTER — Other Ambulatory Visit: Payer: Self-pay | Admitting: Physical Medicine and Rehabilitation

## 2024-01-02 DIAGNOSIS — M5416 Radiculopathy, lumbar region: Secondary | ICD-10-CM

## 2024-01-02 MED ORDER — DIAZEPAM 5 MG PO TABS
ORAL_TABLET | ORAL | 0 refills | Status: DC
Start: 2024-01-02 — End: 2024-04-18

## 2024-01-07 ENCOUNTER — Encounter: Payer: Self-pay | Admitting: Physician Assistant

## 2024-01-09 ENCOUNTER — Other Ambulatory Visit: Payer: Self-pay | Admitting: Physician Assistant

## 2024-01-13 DIAGNOSIS — H401233 Low-tension glaucoma, bilateral, severe stage: Secondary | ICD-10-CM | POA: Diagnosis not present

## 2024-01-15 ENCOUNTER — Ambulatory Visit
Admission: RE | Admit: 2024-01-15 | Discharge: 2024-01-15 | Disposition: A | Discharge status: Home / Self Care | Source: Ambulatory Visit | Attending: Physical Medicine and Rehabilitation | Admitting: Physical Medicine and Rehabilitation

## 2024-01-15 DIAGNOSIS — M5117 Intervertebral disc disorders with radiculopathy, lumbosacral region: Secondary | ICD-10-CM | POA: Diagnosis not present

## 2024-01-15 DIAGNOSIS — M4316 Spondylolisthesis, lumbar region: Secondary | ICD-10-CM | POA: Diagnosis not present

## 2024-01-15 DIAGNOSIS — M5416 Radiculopathy, lumbar region: Secondary | ICD-10-CM

## 2024-01-17 ENCOUNTER — Other Ambulatory Visit: Payer: Self-pay | Admitting: Internal Medicine

## 2024-01-20 NOTE — Progress Notes (Signed)
 Office Visit Note  Patient: Felicia [name]             Date of Birth: 06/17/1940           MRN: [id number]             PCP: Cleotilde Planas, MD Referring: Cleotilde Planas, MD Visit Date: 02/03/2024 Occupation: @GUAROCC @  Subjective:  Discuss DEXA results  History of Present Illness: Felicia [name] is a 83 y.o. female with history of seropositive rheumatoid arthritis. Patient remains on  Rinvoq  15 mg 1 tablet by mouth daily, Methotrexate  4 tablets by mouth every 7 days, and folic acid  1 mg 2 tablets daily.  Patient reports that she tried reducing the dose of methotrexate  to 3 tablets weekly but developed increased arthralgias and joint stiffness so she has resumed 4 tablets weekly.  Patient states that on some days when she takes methotrexate  she experiences some side effects.  Patient states that she has tried taking it at night but does not feel well and wakes up frequently. Patient states that earlier this summer she suffered from a shingles infection.  Patient states that she did not hold Rinvoq  and methotrexate  while recovering from shingles.  She denies any other recent or recurrent infections. Patient states that she has completed physical therapy for her lower back which has helped with her mobility.  She has now been going to [facility] twice a week for a balance and strengthening class.  She has also been walking more for exercise.  At times she uses a walking stick if she is walking for long distances which has been helpful.  Patient has also been performing hand exercises.  She continues to experience stiffness in both hands.   Activities of Daily Living:  Patient reports morning stiffness for 30-60 minutes.   Patient Reports nocturnal pain.  Difficulty dressing/grooming: Denies Difficulty climbing stairs: Reports Difficulty getting out of chair: Denies Difficulty using hands for taps, buttons, cutlery, and/or writing: Reports  Review of Systems  Constitutional:  Positive for fatigue.   HENT:  Positive for mouth dryness. Negative for mouth sores.   Eyes:  Positive for dryness.  Respiratory:  Positive for shortness of breath.   Cardiovascular:  Positive for chest pain. Negative for palpitations.  Gastrointestinal:  Positive for constipation. Negative for blood in stool and diarrhea.  Endocrine: Negative for increased urination.  Genitourinary:  Positive for involuntary urination.  Musculoskeletal:  Positive for joint pain, gait problem, joint pain, myalgias, muscle weakness, morning stiffness and myalgias. Negative for joint swelling and muscle tenderness.  Skin:  Positive for sensitivity to sunlight. Negative for color change, rash and hair loss.  Allergic/Immunologic: Positive for susceptible to infections.  Neurological:  Negative for dizziness and headaches.  Hematological:  Negative for swollen glands.  Psychiatric/Behavioral:  Negative for depressed mood and sleep disturbance. The patient is nervous/anxious.     PMFS History:  Patient Active Problem List   Diagnosis Date Noted   Right-sided low back pain without sciatica 06/21/2022   Coronary artery disease involving native coronary artery with angina pectoris (HCC) 04/08/2022   Pulmonary HTN (HCC) 04/08/2022   Shortness of breath 04/08/2022   Greater trochanteric pain syndrome of right lower extremity 10/23/2021   Age-related osteoporosis without current pathological fracture 06/26/2021   Aplastic anemia (HCC) 06/26/2021   Hiatal hernia 06/26/2021   Prediabetes 06/26/2021   Neuropathy 06/16/2020   SIADH (syndrome of inappropriate ADH production) (HCC) 02/18/2020   Hyponatremia 11/18/2019   Thrombocytosis 08/01/2018  URI (upper respiratory infection) 04/14/2017   Primary osteoarthritis of both knees 03/06/2017   Exertional chest pain    Pain in the chest 03/27/2015   Postural hypotension 05/26/2013   OSA (obstructive sleep apnea) 03/22/2013   Lens replaced by other means 08/25/2012   Low tension  glaucoma 02/14/2012   Osteoarthritis 11/07/2011   Abdominal bruit 10/16/2011   Essential hypertension 08/13/2011   Anisocoria 08/13/2011   Cervical radiculopathy 07/17/2011   DDD (degenerative disc disease), cervical 07/10/2011   Glaucoma 06/06/2011   HIATAL HERNIA 01/30/2010   POSTMENOPAUSAL SYNDROME 12/14/2009   EUSTACHIAN TUBE DYSFUNCTION, RIGHT 06/28/2009   GERD 12/07/2008   Rheumatoid arthritis (HCC) 12/07/2008   History of colonic polyps 12/07/2008   Hyperlipidemia LDL goal <70 10/17/2006   HYPERCALCEMIA 10/17/2006   Mitral valve disease 10/17/2006    Past Medical History:  Diagnosis Date   Adenomatous colon polyp 2004   Colo in 2009 no polyps   Complication of anesthesia    small airway per patient   Concussion    Echocardiogram    Echo 8/22 EF 60-65, no RWMA, mild LVH, normal RVSF, moderately elevated PASP (RVSP 50.9), trivial MR, mild AI, AV sclerosis without stenosis   GERD (gastroesophageal reflux disease)    Glaucoma    Hiatal hernia    Hypertension    Neuropathy    Nuclear stress test    Myoview  8/22: EF 65, no ischemia; low risk   Rheumatoid arthritis(714.0)    O[facility]cia ; Methotrexate  (Dr. Monna)   Sleep apnea    on CPAP    Family History  Problem Relation Age of Onset   Dementia Mother    Lung disease Mother        bronchiectasis   Heart disease Mother        Aortic Stenosis   Osteoporosis Mother    Heart attack Father 75       S/P CBAG   Ulcerative colitis Brother    Stroke Maternal Grandmother 56   Diabetes Neg Hx    Cancer Neg Hx    Colon cancer Neg Hx    Esophageal cancer Neg Hx    Stomach cancer Neg Hx    Pancreatic cancer Neg Hx    Liver disease Neg Hx    Past Surgical History:  Procedure Laterality Date   CARDIAC CATHETERIZATION N/A 03/29/2015   Procedure: Left Heart Cath and Coronary Angiography;  Surgeon: Victory LELON Sharps, MD;  Location: [facility] INVASIVE CV LAB;  Service: Cardiovascular;  Laterality: N/A;   CATARACT EXTRACTION,  BILATERAL     COLONOSCOPY W/ POLYPECTOMY     X1; negative subsequently   DILATION AND CURETTAGE OF UTERUS     EYE SURGERY     due to RA   EYE SURGERY Left 06/2017   eyelid tendon    HYSTEROSCOPY WITH D & C  06/24/2012   Procedure: DILATATION AND CURETTAGE /HYSTEROSCOPY;  Surgeon: Rosaline DELENA Luna, MD;  Location: [facility];  Service: Gynecology;  Laterality: N/A;   SHOULDER SURGERY Left    due to RA   SQUAMOUS CELL CARCINOMA EXCISION     scalp and left leg   TRABECULECTOMY     X 2 OD; X 1 OS   UPPER GASTROINTESTINAL ENDOSCOPY     hiatal hernia   Social History   Social History Narrative   Single   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 Left to write and  right with every thing else    Are you currently employed ?    What is your current occupation?retire   Do you live at home alone? no   Who lives with you? Family friend   What type of home do you live in: 1 story or 2 story? one       Immunization History  Administered Date(s) Administered   INFLUENZA, HIGH DOSE SEASONAL PF 01/15/2016, 03/07/2017, 01/27/2019   Influenza Split 03/08/2014   Influenza Whole 04/07/2007, 02/24/2008, 03/10/2009, 03/03/2012   Influenza,inj,Quad PF,6+ Mos 03/15/2013, 02/21/2015, 03/17/2018   Influenza-Unspecified 03/01/2021   PFIZER(Purple Top)SARS-COV-2 Vaccination 06/24/2019, 07/13/2019, 01/15/2020, 06/26/2020   PPD Test 03/10/2012   Pfizer(Comirnaty)Fall Seasonal Vaccine 12 years and older 02/20/2023, 10/16/2023   Pneumococcal Conjugate-13 09/12/2015   Pneumococcal Polysaccharide-23 03/25/2003, 04/12/2008   Td 12/14/2009   Zoster Recombinant(Shingrix) 03/23/2018     Objective: Vital Signs: BP (!) 152/77 (BP Location: Left Arm, Patient Position: Sitting, Cuff Size: Normal)   Pulse (!) 54   Resp 14   Ht 5' 5 (1.651 m)   Wt 137 lb (62.1 kg)   BMI 22.80 kg/m    Physical Exam Vitals and nursing note reviewed.  Constitutional:       Appearance: She is well-developed.  HENT:     Head: Normocephalic and atraumatic.  Eyes:     Conjunctiva/sclera: Conjunctivae normal.  Cardiovascular:     Rate and Rhythm: Normal rate and regular rhythm.     Heart sounds: Normal heart sounds.  Pulmonary:     Effort: Pulmonary effort is normal.     Breath sounds: Normal breath sounds.  Abdominal:     General: Bowel sounds are normal.     Palpations: Abdomen is soft.  Musculoskeletal:     Cervical back: Normal range of motion.  Lymphadenopathy:     Cervical: No cervical adenopathy.  Skin:    General: Skin is warm and dry.     Capillary Refill: Capillary refill takes less than 2 seconds.  Neurological:     Mental Status: She is alert and oriented to person, place, and time.  Psychiatric:        Behavior: Behavior normal.      Musculoskeletal Exam: C-spine has limited range of motion with lateral rotation.  Thoracic kyphosis.  Limited mobility of lumbar spine.  Shoulder joints, elbow joints, wrist joints, MCPs, PIPs, DIPs have good range of motion with no synovitis.  CMC joint prominence and thickening bilaterally.  PIP and DIP thickening noted.  Hip joints have good range of motion with no groin pain.  Knee joints have good range of motion no warmth or effusion.  Ankle joints have good range of motion with no tenderness or joint swelling.  CDAI Exam: CDAI Score: -- Patient Global: --; Provider Global: -- Swollen: --; Tender: -- Joint Exam 02/03/2024   No joint exam has been documented for this visit   There is currently no information documented on the homunculus. Go to the Rheumatology activity and complete the homunculus joint exam.  Investigation: No additional findings.  Imaging: MR LUMBAR SPINE WO CONTRAST Result Date: 01/28/2024 CLINICAL DATA:  Low back pain. Symptoms persist with greater than 6 weeks of treatment. Low back pain radiates to both buttocks and hips with some involvement of the legs below that. Some  numbness. EXAM: MRI LUMBAR SPINE WITHOUT CONTRAST TECHNIQUE: Multiplanar, multisequence MR imaging of the lumbar spine was performed. No intravenous contrast was administered. COMPARISON:  07/18/2020 FINDINGS: Segmentation:  5 lumbar type  vertebral bodies. Alignment: Scoliotic curvature convex to the right with the apex at L3. Degenerative anterolisthesis at L3-4 of 2 mm and L4-5 of 4 mm. Vertebrae: No fracture or focal bone lesion. Some edematous endplate marrow changes at L4-5 and L5-S1 could contribute to low back pain. Conus medullaris and cauda equina: Conus extends to the L1 level. Conus and cauda equina appear normal. Paraspinal and other soft tissues: Negative Disc levels: No significant finding from T10-11 through L1-2. L2-3: Disc degeneration with loss of disc height. Endplate osteophytes and bulging of the disc. Mild facet osteoarthritis. No compressive stenosis. No change. L3-4: Bilateral facet degeneration and hypertrophy. 2 mm of degenerative anterolisthesis. Bulging of the disc slightly more prominent towards the left. Mild narrowing of the left lateral recess but no apparent neural compression. L4-5: Bilateral facet arthropathy with anterolisthesis of 4 mm. Chronic broad-based disc herniation more prominent towards the left with upward migration of disc material in the left lateral recess towards the proximal foramen. Neural compression is possible in the left lateral recess and proximal foramen. Findings appear similar to the study of 2022. L5-S1: Shallow central disc protrusion with slight caudal down turning. No compressive narrowing of the canal or foramina. Minimal worsening since 2022. IMPRESSION: 1. L4-5: Bilateral facet arthropathy with 4 mm of anterolisthesis. Chronic broad-based disc herniation more prominent towards the left with upward migration of disc material in the left lateral recess towards the proximal foramen. Neural compression is possible in the left lateral recess and proximal  foramen. Findings appear similar to the study of 2022. 2. L3-4: Facet arthropathy with 2 mm of anterolisthesis. Bulging of the disc more prominent towards the left. Mild narrowing of the left lateral recess but no apparent neural compression. 3. L5-S1: Shallow central disc protrusion with slight caudal down turning. No compressive stenosis. Minimal worsening since 2022. 4. L2-3: Disc degeneration with loss of disc height. Mild facet osteoarthritis. No compressive stenosis. 5. Some edematous endplate marrow changes at L4-5 and L5-S1 could contribute to low back pain. Electronically Signed   By: Oneil Officer M.D.   On: 01/28/2024 10:12    Recent Labs: Lab Results  Component Value Date   WBC 6.7 11/21/2023   HGB 11.0 (L) 11/21/2023   PLT 443 (H) 11/21/2023   NA 128 (L) 11/28/2023   K 4.7 11/28/2023   CL 96 11/28/2023   CO2 17 (L) 11/28/2023   GLUCOSE 98 11/28/2023   BUN 27 11/28/2023   CREATININE 0.94 11/28/2023   BILITOT 0.4 11/21/2023   ALKPHOS 37 (L) 05/20/2023   AST 27 11/21/2023   ALT 20 11/21/2023   PROT 6.2 11/21/2023   ALBUMIN 4.4 05/20/2023   CALCIUM  10.0 11/28/2023   GFRAA 76 10/02/2020   QFTBGOLDPLUS NEGATIVE 11/21/2023    Speciality Comments: TB Gold: 01/22/2022 Neg  Reclast  first infusion was given 08/10/2019, 09/18/2020, 11/06/21. Drug holiday x 2 years Rinvoq  started September 06, 2020  Procedures:  No procedures performed Allergies: Sulfonamide derivatives, Amlodipine , and Norvasc  [amlodipine  besylate]    Assessment / Plan:     Visit Diagnoses: Rheumatoid arthritis involving multiple sites with positive rheumatoid factor (HCC) - +RF, +ANA: She has no synovitis on examination today.  She has not had any signs or symptoms of a rheumatoid arthritis flare.  She experiences increased arthralgias and joint stiffness intermittently but has no active inflammation on exam.  Overall she has clinically been doing well taking Rinvoq  15 mg 1 tablet by mouth daily and methotrexate  4 tablets  by mouth once  weekly.  She tried reducing methotrexate  from 4 tablets to 3 tablets weekly but noticed increased arthralgias and stiffness in her hands.  No medication changes will be made at this time.  Will continue to monitor lab work closely.  She was advised to notify us  if she develops any signs or symptoms of a flare.  She will follow-up in the office in 5 months or sooner if needed.  High risk medication use - Rinvoq  15 mg 1 tablet by mouth daily, Methotrexate  4 tablets by mouth every 7 days, and folic acid  1 mg 2 tablets daily.  CBC and CMP updated 11/21/23.  Orders for CBC and CMP were released today. TB gold negative 11/21/23 Lipid panel updated on 08/11/23.  Patient was diagnosed with shingles in summer 2025 but did not hold Rinvoq  and methotrexate  while recovering.  No recurrence.  Patient has received the shingrix vaccine. Discussed the importance of holding rinvoq  and methotrexate  if she develops signs or symptoms of an infection and to resume once the infection has completely cleared.   - Plan: CBC with Differential/Platelet, Comprehensive metabolic panel with GFR  Chronic anemia: Her blood cell count 3.33, hemoglobin 11.0, hematocrit 33.5 on 11/03/2023.  MCV 100.6--she has remained on folic acid  2 mg daily.  Plan to update CBC today. She remains under the care of Dr. Gatha  Thrombocytosis: Platelet count 443K on 11/21/23. Plan to update CBC today.   Primary osteoarthritis of both hands: CMC, PIP, DIP thickening consistent with osteoarthritis of both hands.  No synovitis noted.  She has been performing hand exercises but continues to have morning stiffness lasting 30 minutes to one hour daily.  Trochanteric bursitis, left hip: Not currently symptomatic.  Primary osteoarthritis of both knees: She has good range of motion of both knee joints on examination today.  No warmth or effusion noted.  She uses a walking stick for support.  DDD (degenerative disc disease), cervical: C-sp;ine  has limited ROM with lateral rotation.   Spondylosis of lumbar spine: Chronic pain. Under the care of Dr. Eldonna.  Recently completed physical therapy--improved mobility.  She uses a walking stick as needed for prolonged distances.  She has been going to an exercise class twice weekly at [facility] for balance and strengthening.  MRI of the lumbar spine updated on 01/15/2024: L4-5: Bilateral facet arthropathy with 4 mm of anterolisthesis.  Chronic broad-based disc herniation more prominent towards the left with upward migration of disc material in the left lateral recess towards the proximal foramen. Neural compression is possible in the left lateral recess and proximal foramen. Findings appear similar to the study of 2022. L3-4: Facet arthropathy with 2 mm of anterolisthesis. Bulging of the disc more prominent towards the left. Mild narrowing of the left lateral recess but no apparent neural compression. L5-S1: Shallow central disc protrusion with slight caudal down turning. No compressive stenosis. Minimal worsening since 2022. L2-3: Disc degeneration with loss of disc height. Mild facet osteoarthritis. No compressive stenosis. Some edematous endplate marrow changes at L4-5 and L5-S1 could contribute to low back pain.   Age-related osteoporosis without current pathological fracture - 12/03/2021 T-score -2.1, BMD 0.571 in the right one third radius.  Previous therapy: Reclast  infusions in 2021, 2022 and June 2023.   DEXA updated on 12/17/2023: Right third distal radius BMD 0.607 with T-score -1.4.  Statistically significant increase in BMD of all scan sites.  Right radius +6% change in BMD.  Right total femur +15% change in BMD.  Left total femur +3%  change in BMD. She has been on a drug holiday.  Plan to continue drug holiday x2 years and then she will have repeat DEXA in July 2027--can discuss resuming reclast  at that time if needed.   Vitamin D  deficiency -Plan to check vitamin D  today.  Plan: VITAMIN D   25 Hydroxy (Vit-D Deficiency, Fractures)  Other medical conditions are listed as follows:   History of squamous cell carcinoma  History of glaucoma  History of sleep apnea  SIADH (syndrome of inappropriate ADH production) (HCC)  Neuropathy: She remains on gabapentin  300 mg at bedtime.  Essential hypertension: Blood pressure was elevated today in the office and was rechecked prior to leaving.  Patient was advised to monitor blood pressure closely and to return to PCP if her blood pressure remains elevated.  Coronary artery disease involving native coronary artery of native heart without angina pectoris  History of colonic polyps  History of gastroesophageal reflux (GERD)  History of hyperlipidemia: Lipid panel within normal limits on 08/11/2023.  Increased platelet count: Platelet count 443K on 11/21/23-Stable.  Under the care of hematology.    Trochanteric bursitis, right hip: Not currently symptomatic.    Orders: Orders Placed This Encounter  Procedures   CBC with Differential/Platelet   Comprehensive metabolic panel with GFR   VITAMIN D  25 Hydroxy (Vit-D Deficiency, Fractures)   No orders of the defined types were placed in this encounter.   Follow-Up Instructions: Return in about 5 months (around 07/05/2024) for Rheumatoid arthritis.   Waddell CHRISTELLA Craze, PA-C  Note - This record has been created using Dragon software.  Chart creation errors have been sought, but may not always  have been located. Such creation errors do not reflect on  the standard of medical care.

## 2024-01-22 ENCOUNTER — Other Ambulatory Visit: Payer: Self-pay | Admitting: Physician Assistant

## 2024-01-22 NOTE — Telephone Encounter (Signed)
 Last Fill: 01/31/2023  Next Visit: 02/03/2024  Last Visit: 09/01/2023  Dx: Rheumatoid arthritis involving multiple sites with positive rheumatoid factor (HCC)   Current Dose per office note on 09/01/2023: folic acid  1 mg 2 tablets daily.   Okay to refill Folic Acid ?

## 2024-01-30 ENCOUNTER — Encounter: Payer: Self-pay | Admitting: Physical Medicine and Rehabilitation

## 2024-01-30 ENCOUNTER — Ambulatory Visit: Admitting: Physical Medicine and Rehabilitation

## 2024-01-30 DIAGNOSIS — M5416 Radiculopathy, lumbar region: Secondary | ICD-10-CM

## 2024-01-30 DIAGNOSIS — M5441 Lumbago with sciatica, right side: Secondary | ICD-10-CM

## 2024-01-30 DIAGNOSIS — G8929 Other chronic pain: Secondary | ICD-10-CM

## 2024-01-30 DIAGNOSIS — M47816 Spondylosis without myelopathy or radiculopathy, lumbar region: Secondary | ICD-10-CM | POA: Diagnosis not present

## 2024-01-30 DIAGNOSIS — M5442 Lumbago with sciatica, left side: Secondary | ICD-10-CM

## 2024-01-30 DIAGNOSIS — M5116 Intervertebral disc disorders with radiculopathy, lumbar region: Secondary | ICD-10-CM

## 2024-01-30 NOTE — Progress Notes (Signed)
 SOLACE MANWARREN - 83 y.o. female MRN [id number]  Date of birth: Mar 01, 1941  Office Visit Note: Visit Date: 01/30/2024 PCP: Cleotilde Planas, MD Referred by: Cleotilde Planas, MD  Subjective: Chief Complaint  Patient presents with   Lower Back - Pain   HPI: Felicia [name] is a 83 y.o. female who comes in today for evaluation of chronic bilateral lower back pain radiating to thighs and down left, left greater than right. Patient is well known to us . Pain ongoing for several years, describes pain as sore and aching sensation, currently rates as 3 out of 10. Her pain has improved significantly with short course of formal physical therapy and exercise classes. Recent lumbar MRI imaging shows bilateral facet arthropathy with 4 mm of anterolisthesis at L4-L5, there is chronic broad-based disc herniation more prominent towards the left with upward migration of disc material in the left lateral recess towards the proximal foramen. Some edematous endplate marrow changes at L4-5 and L5-S1 could contribute to low back pain. No high grade spinal canal stenosis noted. She underwent bilateral L4 transforaminal epidural steroid injection in our office on 12/15/2023. She reports less than 50% relief of pain with this procedure. She does not feel injection was very beneficial in alleviating her pain. Patient denies focal weakness. No recent trauma or falls.   History of polyneuropathy, currently taking Gabapentin . She is also treated by Dr. Nile Nash for rheumatoid arthritis, osteoporosis.          Review of Systems  Musculoskeletal:  Positive for back pain.  Neurological:  Negative for tingling, sensory change, focal weakness and weakness.  All other systems reviewed and are negative.  Otherwise per HPI.  Assessment & Plan: Visit Diagnoses:    ICD-10-CM   1. Chronic bilateral low back pain with bilateral sciatica  M54.42    M54.41    G89.29     2. Lumbar radiculopathy  M54.16     3. Intervertebral  disc disorders with radiculopathy, lumbar region  M51.16     4. Facet arthropathy, lumbar  M47.816        Plan: Findings:  Chronic bilateral lower back pain radiating to thighs and down left, left greater than right. Her pain has improved over the last several weeks, she continues with conservative therapies such as home exercise regimen and medications. Patients clinical presentation and exam are consistent with lumbar radiculopathy, more of L4 nerve pattern. I also feel her lower back pain is more facet mediated discomfort. We discussed treatment plan in detail today. Would hold on injection at this time as she is feeling better. Should her pain return would consider performing diagnostic and hopefully therapeutic left L4-L5 interlaminar epidural steroid injection. She is not currently taking anticoagulant medications. I encouraged patient to contact our office should her pain increase. She can continue with exercise classes and physical therapy as tolerated. No red flag symptoms noted upon exam today.     Meds & Orders: No orders of the defined types were placed in this encounter.  No orders of the defined types were placed in this encounter.   Follow-up: Return if symptoms worsen or fail to improve.   Procedures: No procedures performed      Clinical History: Narrative & Impression CLINICAL DATA:  Low back pain. Symptoms persist with greater than 6 weeks of treatment. Low back pain radiates to both buttocks and hips with some involvement of the legs below that. Some numbness.   EXAM: MRI LUMBAR SPINE WITHOUT CONTRAST  TECHNIQUE: Multiplanar, multisequence MR imaging of the lumbar spine was performed. No intravenous contrast was administered.   COMPARISON:  07/18/2020   FINDINGS: Segmentation:  5 lumbar type vertebral bodies.   Alignment: Scoliotic curvature convex to the right with the apex at L3. Degenerative anterolisthesis at L3-4 of 2 mm and L4-5 of 4 mm.   Vertebrae:  No fracture or focal bone lesion. Some edematous endplate marrow changes at L4-5 and L5-S1 could contribute to low back pain.   Conus medullaris and cauda equina: Conus extends to the L1 level. Conus and cauda equina appear normal.   Paraspinal and other soft tissues: Negative   Disc levels:   No significant finding from T10-11 through L1-2.   L2-3: Disc degeneration with loss of disc height. Endplate osteophytes and bulging of the disc. Mild facet osteoarthritis. No compressive stenosis. No change.   L3-4: Bilateral facet degeneration and hypertrophy. 2 mm of degenerative anterolisthesis. Bulging of the disc slightly more prominent towards the left. Mild narrowing of the left lateral recess but no apparent neural compression.   L4-5: Bilateral facet arthropathy with anterolisthesis of 4 mm. Chronic broad-based disc herniation more prominent towards the left with upward migration of disc material in the left lateral recess towards the proximal foramen. Neural compression is possible in the left lateral recess and proximal foramen. Findings appear similar to the study of 2022.   L5-S1: Shallow central disc protrusion with slight caudal down turning. No compressive narrowing of the canal or foramina. Minimal worsening since 2022.   IMPRESSION: 1. L4-5: Bilateral facet arthropathy with 4 mm of anterolisthesis. Chronic broad-based disc herniation more prominent towards the left with upward migration of disc material in the left lateral recess towards the proximal foramen. Neural compression is possible in the left lateral recess and proximal foramen. Findings appear similar to the study of 2022. 2. L3-4: Facet arthropathy with 2 mm of anterolisthesis. Bulging of the disc more prominent towards the left. Mild narrowing of the left lateral recess but no apparent neural compression. 3. L5-S1: Shallow central disc protrusion with slight caudal down turning. No compressive stenosis.  Minimal worsening since 2022. 4. L2-3: Disc degeneration with loss of disc height. Mild facet osteoarthritis. No compressive stenosis. 5. Some edematous endplate marrow changes at L4-5 and L5-S1 could contribute to low back pain.     Electronically Signed   By: Oneil Officer M.D.   On: 01/28/2024 10:12   She reports that she quit smoking about 52 years ago. Her smoking use included cigarettes. She started smoking about 57 years ago. She has a 2.5 pack-year smoking history. She has never been exposed to tobacco smoke. She has never used smokeless tobacco. No results for input(s): HGBA1C, LABURIC in the last 8760 hours.  Objective:  VS:  HT:    WT:   BMI:     BP:   HR: bpm  TEMP: ( )  RESP:  Physical Exam Vitals and nursing note reviewed.  HENT:     Head: Normocephalic and atraumatic.     Right Ear: External ear normal.     Left Ear: External ear normal.     Nose: Nose normal.     Mouth/Throat:     Mouth: Mucous membranes are moist.  Eyes:     Extraocular Movements: Extraocular movements intact.  Cardiovascular:     Rate and Rhythm: Normal rate.     Pulses: Normal pulses.  Pulmonary:     Effort: Pulmonary effort is normal.  Abdominal:  General: Abdomen is flat. There is no distension.  Musculoskeletal:        General: Tenderness present.     Cervical back: Normal range of motion.     Comments: Patient is slow to rise from seated position to standing. Good lumbar range of motion. No pain noted with facet loading. 5/5 strength noted with bilateral hip flexion, knee flexion/extension, ankle dorsiflexion/plantarflexion and EHL. No clonus noted bilaterally. No pain upon palpation of greater trochanters. No pain with internal/external rotation of bilateral hips. Sensation intact bilaterally. Dysesthesias noted to bilateral L4 dermatomes. Negative slump test bilaterally. Ambulates without aid, gait steady.   Skin:    General: Skin is warm and dry.     Capillary Refill:  Capillary refill takes less than 2 seconds.  Neurological:     General: No focal deficit present.     Mental Status: She is alert and oriented to person, place, and time.  Psychiatric:        Mood and Affect: Mood normal.        Behavior: Behavior normal.     Ortho Exam  Imaging: No results found.  Past Medical/Family/Surgical/Social History: Medications & Allergies reviewed per EMR, new medications updated. Patient Active Problem List   Diagnosis Date Noted   Right-sided low back pain without sciatica 06/21/2022   Coronary artery disease involving native coronary artery with angina pectoris (HCC) 04/08/2022   Pulmonary HTN (HCC) 04/08/2022   Shortness of breath 04/08/2022   Greater trochanteric pain syndrome of right lower extremity 10/23/2021   Age-related osteoporosis without current pathological fracture 06/26/2021   Aplastic anemia (HCC) 06/26/2021   Hiatal hernia 06/26/2021   Prediabetes 06/26/2021   Neuropathy 06/16/2020   SIADH (syndrome of inappropriate ADH production) (HCC) 02/18/2020   Hyponatremia 11/18/2019   Thrombocytosis 08/01/2018   URI (upper respiratory infection) 04/14/2017   Primary osteoarthritis of both knees 03/06/2017   Exertional chest pain    Pain in the chest 03/27/2015   Postural hypotension 05/26/2013   OSA (obstructive sleep apnea) 03/22/2013   Lens replaced by other means 08/25/2012   Low tension glaucoma 02/14/2012   Osteoarthritis 11/07/2011   Abdominal bruit 10/16/2011   Essential hypertension 08/13/2011   Anisocoria 08/13/2011   Cervical radiculopathy 07/17/2011   DDD (degenerative disc disease), cervical 07/10/2011   Glaucoma 06/06/2011   HIATAL HERNIA 01/30/2010   POSTMENOPAUSAL SYNDROME 12/14/2009   EUSTACHIAN TUBE DYSFUNCTION, RIGHT 06/28/2009   GERD 12/07/2008   Rheumatoid arthritis (HCC) 12/07/2008   History of colonic polyps 12/07/2008   Hyperlipidemia LDL goal <70 10/17/2006   HYPERCALCEMIA 10/17/2006   Mitral valve  disease 10/17/2006   Past Medical History:  Diagnosis Date   Adenomatous colon polyp 2004   Colo in 2009 no polyps   Complication of anesthesia    small airway per patient   Concussion    Echocardiogram    Echo 8/22 EF 60-65, no RWMA, mild LVH, normal RVSF, moderately elevated PASP (RVSP 50.9), trivial MR, mild AI, AV sclerosis without stenosis   GERD (gastroesophageal reflux disease)    Glaucoma    Hiatal hernia    Hypertension    Neuropathy    Nuclear stress test    Myoview  8/22: EF 65, no ischemia; low risk   Rheumatoid arthritis(714.0)    Orencia ; Methotrexate  (Dr. Monna)   Sleep apnea    on CPAP   Family History  Problem Relation Age of Onset   Dementia Mother    Lung disease Mother  bronchiectasis   Heart disease Mother        Aortic Stenosis   Osteoporosis Mother    Heart attack Father 57       S/P CBAG   Ulcerative colitis Brother    Stroke Maternal Grandmother 62   Diabetes Neg Hx    Cancer Neg Hx    Colon cancer Neg Hx    Esophageal cancer Neg Hx    Stomach cancer Neg Hx    Pancreatic cancer Neg Hx    Liver disease Neg Hx    Past Surgical History:  Procedure Laterality Date   CARDIAC CATHETERIZATION N/A 03/29/2015   Procedure: Left Heart Cath and Coronary Angiography;  Surgeon: Victory LELON Sharps, MD;  Location: [facility] INVASIVE CV LAB;  Service: Cardiovascular;  Laterality: N/A;   CATARACT EXTRACTION, BILATERAL     COLONOSCOPY W/ POLYPECTOMY     X1; negative subsequently   DILATION AND CURETTAGE OF UTERUS     EYE SURGERY     due to RA   EYE SURGERY Left 06/2017   eyelid tendon    HYSTEROSCOPY WITH D & C  06/24/2012   Procedure: DILATATION AND CURETTAGE /HYSTEROSCOPY;  Surgeon: Rosaline DELENA Luna, MD;  Location: [facility];  Service: Gynecology;  Laterality: N/A;   SHOULDER SURGERY Left    due to RA   SQUAMOUS CELL CARCINOMA EXCISION     scalp and left leg   TRABECULECTOMY     X 2 OD; X 1 OS   UPPER GASTROINTESTINAL ENDOSCOPY     hiatal hernia    Social History   Occupational History   Occupation: retired    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)   Tobacco Use   Smoking status: Former    Current packs/day: 0.00    Average packs/day: 0.5 packs/day for 5.0 years (2.5 ttl pk-yrs)    Types: Cigarettes    Start date: 06/03/1966    Quit date: 06/04/1971    Years since quitting: 52.6    Passive exposure: Never   Smokeless tobacco: Never  Vaping Use   Vaping status: Never Used  Substance and Sexual Activity   Alcohol use: Never   Drug use: No   Sexual activity: Yes    Birth control/protection: Post-menopausal

## 2024-01-30 NOTE — Progress Notes (Signed)
 Pain Scale   Average Pain 4 Patient advised she has chronic lower radiating to left side. Patient here for MRI review        +Driver, -BT, -Dye Allergies.

## 2024-02-03 ENCOUNTER — Encounter: Payer: Self-pay | Admitting: Physician Assistant

## 2024-02-03 ENCOUNTER — Ambulatory Visit: Attending: Physician Assistant | Admitting: Physician Assistant

## 2024-02-03 VITALS — BP 152/77 | HR 54 | Resp 14 | Ht 65.0 in | Wt 137.0 lb

## 2024-02-03 DIAGNOSIS — M5030 Other cervical disc degeneration, unspecified cervical region: Secondary | ICD-10-CM | POA: Diagnosis not present

## 2024-02-03 DIAGNOSIS — M810 Age-related osteoporosis without current pathological fracture: Secondary | ICD-10-CM

## 2024-02-03 DIAGNOSIS — M19041 Primary osteoarthritis, right hand: Secondary | ICD-10-CM | POA: Diagnosis not present

## 2024-02-03 DIAGNOSIS — M170 Bilateral primary osteoarthritis of knee: Secondary | ICD-10-CM | POA: Diagnosis not present

## 2024-02-03 DIAGNOSIS — Z8669 Personal history of other diseases of the nervous system and sense organs: Secondary | ICD-10-CM

## 2024-02-03 DIAGNOSIS — R7989 Other specified abnormal findings of blood chemistry: Secondary | ICD-10-CM

## 2024-02-03 DIAGNOSIS — M7062 Trochanteric bursitis, left hip: Secondary | ICD-10-CM

## 2024-02-03 DIAGNOSIS — Z860100 Personal history of colon polyps, unspecified: Secondary | ICD-10-CM

## 2024-02-03 DIAGNOSIS — I2510 Atherosclerotic heart disease of native coronary artery without angina pectoris: Secondary | ICD-10-CM

## 2024-02-03 DIAGNOSIS — E559 Vitamin D deficiency, unspecified: Secondary | ICD-10-CM

## 2024-02-03 DIAGNOSIS — M47816 Spondylosis without myelopathy or radiculopathy, lumbar region: Secondary | ICD-10-CM

## 2024-02-03 DIAGNOSIS — E222 Syndrome of inappropriate secretion of antidiuretic hormone: Secondary | ICD-10-CM

## 2024-02-03 DIAGNOSIS — Z8639 Personal history of other endocrine, nutritional and metabolic disease: Secondary | ICD-10-CM

## 2024-02-03 DIAGNOSIS — D649 Anemia, unspecified: Secondary | ICD-10-CM

## 2024-02-03 DIAGNOSIS — M0579 Rheumatoid arthritis with rheumatoid factor of multiple sites without organ or systems involvement: Secondary | ICD-10-CM | POA: Diagnosis not present

## 2024-02-03 DIAGNOSIS — G629 Polyneuropathy, unspecified: Secondary | ICD-10-CM

## 2024-02-03 DIAGNOSIS — Z79899 Other long term (current) drug therapy: Secondary | ICD-10-CM | POA: Diagnosis not present

## 2024-02-03 DIAGNOSIS — I10 Essential (primary) hypertension: Secondary | ICD-10-CM

## 2024-02-03 DIAGNOSIS — Z8589 Personal history of malignant neoplasm of other organs and systems: Secondary | ICD-10-CM

## 2024-02-03 DIAGNOSIS — D75839 Thrombocytosis, unspecified: Secondary | ICD-10-CM | POA: Diagnosis not present

## 2024-02-03 DIAGNOSIS — M7061 Trochanteric bursitis, right hip: Secondary | ICD-10-CM

## 2024-02-03 DIAGNOSIS — Z8719 Personal history of other diseases of the digestive system: Secondary | ICD-10-CM

## 2024-02-03 DIAGNOSIS — M19042 Primary osteoarthritis, left hand: Secondary | ICD-10-CM

## 2024-02-04 ENCOUNTER — Ambulatory Visit: Payer: Self-pay | Admitting: Physician Assistant

## 2024-02-04 LAB — COMPREHENSIVE METABOLIC PANEL WITH GFR
AG Ratio: 2 (calc) (ref 1.0–2.5)
ALT: 25 U/L (ref 6–29)
AST: 34 U/L (ref 10–35)
Albumin: 4.6 g/dL (ref 3.6–5.1)
Alkaline phosphatase (APISO): 36 U/L — ABNORMAL LOW (ref 37–153)
BUN/Creatinine Ratio: 27 (calc) — ABNORMAL HIGH (ref 6–22)
BUN: 28 mg/dL — ABNORMAL HIGH (ref 7–25)
CO2: 24 mmol/L (ref 20–32)
Calcium: 10.2 mg/dL (ref 8.6–10.4)
Chloride: 96 mmol/L — ABNORMAL LOW (ref 98–110)
Creat: 1.02 mg/dL — ABNORMAL HIGH (ref 0.60–0.95)
Globulin: 2.3 g/dL (ref 1.9–3.7)
Glucose, Bld: 101 mg/dL — ABNORMAL HIGH (ref 65–99)
Potassium: 5 mmol/L (ref 3.5–5.3)
Sodium: 128 mmol/L — ABNORMAL LOW (ref 135–146)
Total Bilirubin: 0.7 mg/dL (ref 0.2–1.2)
Total Protein: 6.9 g/dL (ref 6.1–8.1)
eGFR: 55 mL/min/1.73m2 — ABNORMAL LOW (ref >=60)

## 2024-02-04 LAB — CBC WITH DIFFERENTIAL/PLATELET
Absolute Lymphocytes: 1709 {cells}/uL (ref 850–3900)
Absolute Monocytes: 714 {cells}/uL (ref 200–950)
Basophils Absolute: 43 {cells}/uL (ref 0–200)
Basophils Relative: 0.5 %
Eosinophils Absolute: 34 {cells}/uL (ref 15–500)
Eosinophils Relative: 0.4 %
HCT: 31.6 % — ABNORMAL LOW (ref 35.0–45.0)
Hemoglobin: 10.4 g/dL — ABNORMAL LOW (ref 11.7–15.5)
MCH: 33 pg (ref 27.0–33.0)
MCHC: 32.9 g/dL (ref 32.0–36.0)
MCV: 100.3 fL — ABNORMAL HIGH (ref 80.0–100.0)
MPV: 9.4 fL (ref 7.5–12.5)
Monocytes Relative: 8.4 %
Neutro Abs: 6001 {cells}/uL (ref 1500–7800)
Neutrophils Relative %: 70.6 %
Platelets: 541 Thousand/uL — ABNORMAL HIGH (ref 140–400)
RBC: 3.15 Million/uL — ABNORMAL LOW (ref 3.80–5.10)
RDW: 13.6 % (ref 11.0–15.0)
Total Lymphocyte: 20.1 %
WBC: 8.5 Thousand/uL (ref 3.8–10.8)

## 2024-02-04 LAB — VITAMIN D 25 HYDROXY (VIT D DEFICIENCY, FRACTURES): Vit D, 25-Hydroxy: 65 ng/mL (ref 30–100)

## 2024-02-04 NOTE — Progress Notes (Signed)
 Red blood cell count, hemoglobin, hematocrit remain low but are slightly lower than last time.  Platelet count has remained elevated-continues to trend up 541K. Please clarify if she has a hematologist or has seen her primary care for a workup of anemia.  Rest of CBC WNL.  Creatinine 1.02 and GFR 55--avoid the use of NSAIDs.  Recommend reducing the dose of methotrexate  to 3 tablets once weekly with close monitoring.   Sodium remains low-128-stable. Alk phos is borderline low.

## 2024-02-05 MED ORDER — METHOTREXATE SODIUM 2.5 MG PO TABS: 7.5000 mg | ORAL_TABLET | ORAL | Status: DC

## 2024-02-05 NOTE — Progress Notes (Signed)
 I spoke with Dr. Dolphus about the DEXA scan--ok to continue to hold Reclast . We can repeat DEXA in 1-2 years.

## 2024-02-15 DIAGNOSIS — G4733 Obstructive sleep apnea (adult) (pediatric): Secondary | ICD-10-CM | POA: Diagnosis not present

## 2024-02-17 DIAGNOSIS — G4733 Obstructive sleep apnea (adult) (pediatric): Secondary | ICD-10-CM | POA: Diagnosis not present

## 2024-02-18 ENCOUNTER — Inpatient Hospital Stay: Attending: Internal Medicine

## 2024-02-18 ENCOUNTER — Inpatient Hospital Stay: Admitting: Internal Medicine

## 2024-02-18 VITALS — BP 163/80 | HR 60 | Temp 97.2°F | Resp 17 | Ht 65.0 in | Wt 140.0 lb

## 2024-02-18 DIAGNOSIS — H4010X0 Unspecified open-angle glaucoma, stage unspecified: Secondary | ICD-10-CM | POA: Insufficient documentation

## 2024-02-18 DIAGNOSIS — G720 Drug-induced myopathy: Secondary | ICD-10-CM | POA: Insufficient documentation

## 2024-02-18 DIAGNOSIS — C44622 Squamous cell carcinoma of skin of right upper limb, including shoulder: Secondary | ICD-10-CM | POA: Insufficient documentation

## 2024-02-18 DIAGNOSIS — I700 Atherosclerosis of aorta: Secondary | ICD-10-CM | POA: Insufficient documentation

## 2024-02-18 DIAGNOSIS — D2372 Other benign neoplasm of skin of left lower limb, including hip: Secondary | ICD-10-CM | POA: Insufficient documentation

## 2024-02-18 DIAGNOSIS — D2370 Other benign neoplasm of skin of unspecified lower limb, including hip: Secondary | ICD-10-CM | POA: Insufficient documentation

## 2024-02-18 DIAGNOSIS — D75838 Other thrombocytosis: Secondary | ICD-10-CM | POA: Insufficient documentation

## 2024-02-18 DIAGNOSIS — D619 Aplastic anemia, unspecified: Secondary | ICD-10-CM

## 2024-02-18 DIAGNOSIS — M47812 Spondylosis without myelopathy or radiculopathy, cervical region: Secondary | ICD-10-CM | POA: Insufficient documentation

## 2024-02-18 DIAGNOSIS — M069 Rheumatoid arthritis, unspecified: Secondary | ICD-10-CM | POA: Diagnosis not present

## 2024-02-18 DIAGNOSIS — E7800 Pure hypercholesterolemia, unspecified: Secondary | ICD-10-CM | POA: Insufficient documentation

## 2024-02-18 DIAGNOSIS — D509 Iron deficiency anemia, unspecified: Secondary | ICD-10-CM | POA: Insufficient documentation

## 2024-02-18 DIAGNOSIS — G629 Polyneuropathy, unspecified: Secondary | ICD-10-CM | POA: Insufficient documentation

## 2024-02-18 DIAGNOSIS — D225 Melanocytic nevi of trunk: Secondary | ICD-10-CM | POA: Insufficient documentation

## 2024-02-18 DIAGNOSIS — D649 Anemia, unspecified: Secondary | ICD-10-CM | POA: Insufficient documentation

## 2024-02-18 DIAGNOSIS — I129 Hypertensive chronic kidney disease with stage 1 through stage 4 chronic kidney disease, or unspecified chronic kidney disease: Secondary | ICD-10-CM | POA: Insufficient documentation

## 2024-02-18 DIAGNOSIS — R350 Frequency of micturition: Secondary | ICD-10-CM | POA: Insufficient documentation

## 2024-02-18 DIAGNOSIS — B029 Zoster without complications: Secondary | ICD-10-CM | POA: Diagnosis not present

## 2024-02-18 DIAGNOSIS — L821 Other seborrheic keratosis: Secondary | ICD-10-CM | POA: Insufficient documentation

## 2024-02-18 DIAGNOSIS — F0781 Postconcussional syndrome: Secondary | ICD-10-CM | POA: Insufficient documentation

## 2024-02-18 DIAGNOSIS — I25119 Atherosclerotic heart disease of native coronary artery with unspecified angina pectoris: Secondary | ICD-10-CM | POA: Insufficient documentation

## 2024-02-18 DIAGNOSIS — C44519 Basal cell carcinoma of skin of other part of trunk: Secondary | ICD-10-CM | POA: Insufficient documentation

## 2024-02-18 DIAGNOSIS — G619 Inflammatory polyneuropathy, unspecified: Secondary | ICD-10-CM | POA: Insufficient documentation

## 2024-02-18 DIAGNOSIS — D539 Nutritional anemia, unspecified: Secondary | ICD-10-CM

## 2024-02-18 DIAGNOSIS — Z85828 Personal history of other malignant neoplasm of skin: Secondary | ICD-10-CM | POA: Insufficient documentation

## 2024-02-18 DIAGNOSIS — N1831 Chronic kidney disease, stage 3a: Secondary | ICD-10-CM | POA: Insufficient documentation

## 2024-02-18 LAB — CBC WITH DIFFERENTIAL (CANCER CENTER ONLY)
Abs Immature Granulocytes: 0.06 K/uL (ref 0.00–0.07)
Basophils Absolute: 0 K/uL (ref 0.0–0.1)
Basophils Relative: 1 %
Eosinophils Absolute: 0 K/uL (ref 0.0–0.5)
Eosinophils Relative: 1 %
HCT: 27.7 % — ABNORMAL LOW (ref 36.0–46.0)
Hemoglobin: 9.9 g/dL — ABNORMAL LOW (ref 12.0–15.0)
Immature Granulocytes: 1 %
Lymphocytes Relative: 29 %
Lymphs Abs: 1.7 K/uL (ref 0.7–4.0)
MCH: 33.4 pg (ref 26.0–34.0)
MCHC: 35.7 g/dL (ref 30.0–36.0)
MCV: 93.6 fL (ref 80.0–100.0)
Monocytes Absolute: 0.6 K/uL (ref 0.1–1.0)
Monocytes Relative: 11 %
Neutro Abs: 3.3 K/uL (ref 1.7–7.7)
Neutrophils Relative %: 57 %
Platelet Count: 469 K/uL — ABNORMAL HIGH (ref 150–400)
RBC: 2.96 MIL/uL — ABNORMAL LOW (ref 3.87–5.11)
RDW: 13.5 % (ref 11.5–15.5)
WBC Count: 5.7 K/uL (ref 4.0–10.5)
nRBC: 0 % (ref 0.0–0.2)

## 2024-02-18 LAB — IRON AND IRON BINDING CAPACITY (CC-WL,HP ONLY)
Iron: 142 ug/dL (ref 28–170)
Saturation Ratios: 35 % — ABNORMAL HIGH (ref 10.4–31.8)
TIBC: 407 ug/dL (ref 250–450)
UIBC: 265 ug/dL (ref 148–442)

## 2024-02-18 LAB — FERRITIN: Ferritin: 353 ng/mL — ABNORMAL HIGH (ref 11–307)

## 2024-02-18 NOTE — Progress Notes (Signed)
 [facility] Telephone:([phone]) [phone]   Fax:([phone]) [phone]  OFFICE PROGRESS NOTE  Felicia Planas, MD [address] [place] [address]  DIAGNOSIS:  1) Chronic anemia likely multifactorial, including iron deficiency and chronic disease due to rheumatoid arthritis.  2) Reactive Thrombocytosis; Elevated platelet counts likely secondary to iron deficiency anemia.  PRIOR THERAPY: None  CURRENT THERAPY: Integra +1 capsule p.o. daily  INTERVAL HISTORY: KYMARI [name] 83 y.o. female returns to the clinic today for 48-month follow-up visit.Discussed the use of AI scribe software for clinical note transcription with the patient, who gave verbal consent to proceed.  History of Present Illness Felicia [name] is an 83 year old female with chronic anemia and reactive thrombocytosis who presents for evaluation of her anemia.  She has a history of chronic anemia. Her hemoglobin levels remain low despite taking Integra Plus  daily, which she feels is not helping much. Her iron and B12 levels were previously tested and found to be normal.  She also has reactive thrombocytosis, with persistently high platelet counts noted in her lab reports.  She experienced shingles over the summer, which she describes as 'not fun', and continues to deal with lower back issues.  She has ongoing rheumatoid arthritis, which is associated with neuropathy in her feet and lower back issues.  She has a history of high blood pressure, which she attributes to pain from neuropathy. She is currently taking metoprolol  and clonidine as needed for blood pressure management.     MEDICAL HISTORY: Past Medical History:  Diagnosis Date   Adenomatous colon polyp 2004   Colo in 2009 no polyps   Complication of anesthesia    small airway per patient   Concussion    Echocardiogram    Echo 8/22 EF 60-65, no RWMA, mild LVH, normal RVSF, moderately elevated PASP (RVSP 50.9), trivial MR, mild AI, AV sclerosis  without stenosis   GERD (gastroesophageal reflux disease)    Glaucoma    Hiatal hernia    Hypertension    Neuropathy    Nuclear stress test    Myoview  8/22: EF 65, no ischemia; low risk   Rheumatoid arthritis(714.0)    Orencia ; Methotrexate  (Dr. Monna)   Sleep apnea    on CPAP    ALLERGIES:  is allergic to sulfonamide derivatives, amlodipine , and norvasc  [amlodipine  besylate].  MEDICATIONS:  Current Outpatient Medications  Medication Sig Dispense Refill   Apoaequorin (PREVAGEN PO) Take 1 tablet by mouth daily.     Artificial Tear Solution (GENTEAL TEARS OP) Apply to eye.     baclofen  (LIORESAL ) 10 MG tablet Take 1 tablet (10 mg total) by mouth 2 (two) times daily. 30 each 0   Bempedoic Acid-Ezetimibe  (NEXLIZET ) 180-10 MG TABS TAKE 1 TABLET BY MOUTH DAILY 90 tablet 3   Carboxymethylcellulose Sodium (REFRESH OP) Apply 1 drop to eye daily as needed (FOR DRY EYES).     cloNIDine (CATAPRES) 0.1 MG tablet Take 1 tablet (0.1 mg total) by mouth daily as needed. If SBP >=180     Cyanocobalamin  2500 MCG SUBL as directed Sublingual     diazepam  (VALIUM ) 5 MG tablet Take one tablet by mouth with light food one hour prior to procedure. 1 tablet 0   erythromycin ophthalmic ointment 1 Application at bedtime.     esomeprazole  (NEXIUM ) 40 MG capsule TAKE 1 CAPSULE BY MOUTH TWICE DAILY BEFORE MEALS 180 capsule 1   estradiol  (ESTRACE ) 1 MG tablet Take 1 mg by mouth daily.  0   FeFum-FePoly-FA-B Cmp-C-Biot (INTEGRA PLUS ) CAPS TAKE ONE CAPSULE BY MOUTH DAILY 30 capsule 3   folic acid  (FOLVITE ) 1 MG tablet TAKE 2 TABLETS(2 MG) BY MOUTH DAILY 180 tablet 3   gabapentin  (NEURONTIN ) 100 MG capsule Take 3 capsules (300 mg total) by mouth at bedtime. 270 capsule 3   isosorbide  mononitrate (IMDUR ) 30 MG 24 hr tablet TAKE 1 TABLET BY MOUTH EVERY DAY 90 tablet 3   latanoprost  (XALATAN ) 0.005 % ophthalmic solution Place 1 drop into the left eye at bedtime.     losartan -hydrochlorothiazide  (HYZAAR ) 100-25 MG  tablet Take 1 tablet by mouth daily. 90 tablet 3   methotrexate  (RHEUMATREX) 2.5 MG tablet Take 3 tablets (7.5 mg total) by mouth once a week. Caution:Chemotherapy. Protect from light.     metoprolol  tartrate (LOPRESSOR ) 25 MG tablet Take 1 tablet (25 mg total) by mouth 2 (two) times daily. Please call [phone] to schedule a November appointment for future refills. Thank you. 180 tablet 0   metroNIDAZOLE  (METROGEL ) 0.75 % gel Apply 1 application topically 2 (two) times daily.      Multiple Vitamins-Minerals (CENTRUM SILVER PO) Take 1 capsule by mouth daily.     Polyethylene Glycol 3350 (MIRALAX PO) Take 1 Dose by mouth as needed.     progesterone  (PROMETRIUM ) 200 MG capsule Take 200 mg by mouth daily.     RINVOQ  15 MG TB24 TAKE 1 TABLET BY MOUTH EVERY DAY 30 tablet 2   timolol  (TIMOPTIC ) 0.5 % ophthalmic solution Place 1 drop into both eyes daily. One drop in left eye every morning One drop in right eye every evening     valACYclovir  (VALTREX ) 1000 MG tablet Take 1,000 mg by mouth as needed (fever blister). Reported on 06/14/2015     Vibegron (GEMTESA) 75 MG TABS Take 1 tablet by mouth daily.     Wheat Dextrin (BENEFIBER DRINK MIX PO) Take 1 Scoop by mouth at bedtime.     Zoledronic  Acid (RECLAST  IV) Inject into the vein once. Once a year     No current facility-administered medications for this visit.    SURGICAL HISTORY:  Past Surgical History:  Procedure Laterality Date   CARDIAC CATHETERIZATION N/A 03/29/2015   Procedure: Left Heart Cath and Coronary Angiography;  Surgeon: Victory LELON Sharps, MD;  Location: [facility] INVASIVE CV LAB;  Service: Cardiovascular;  Laterality: N/A;   CATARACT EXTRACTION, BILATERAL     COLONOSCOPY W/ POLYPECTOMY     X1; negative subsequently   DILATION AND CURETTAGE OF UTERUS     EYE SURGERY     due to RA   EYE SURGERY Left 06/2017   eyelid tendon    HYSTEROSCOPY WITH D & C  06/24/2012   Procedure: DILATATION AND CURETTAGE /HYSTEROSCOPY;  Surgeon: Rosaline DELENA Luna, MD;  Location: [facility];  Service: Gynecology;  Laterality: N/A;   SHOULDER SURGERY Left    due to RA   SQUAMOUS CELL CARCINOMA EXCISION     scalp and left leg   TRABECULECTOMY     X 2 OD; X 1 OS   UPPER GASTROINTESTINAL ENDOSCOPY     hiatal hernia    REVIEW OF SYSTEMS:  A comprehensive review of systems was negative except for: Constitutional: positive for fatigue Musculoskeletal: positive for arthralgias Neurological: positive for paresthesia   PHYSICAL EXAMINATION: General appearance: alert, cooperative, fatigued, and no distress Head: Normocephalic, without obvious abnormality, atraumatic Neck: no adenopathy, no JVD, supple, symmetrical, trachea midline, and thyroid  not enlarged, symmetric, no tenderness/mass/nodules  Lymph nodes: Cervical, supraclavicular, and axillary nodes normal. Resp: clear to auscultation bilaterally Back: symmetric, no curvature. ROM normal. No CVA tenderness. Cardio: regular rate and rhythm, S1, S2 normal, no murmur, click, rub or gallop GI: soft, non-tender; bowel sounds normal; no masses,  no organomegaly Extremities: extremities normal, atraumatic, no cyanosis or edema  ECOG PERFORMANCE STATUS: 1 - Symptomatic but completely ambulatory  Blood pressure (!) 163/80, pulse 60, temperature (!) 97.2 F (36.2 C), resp. rate 17, height 5' 5 (1.651 m), weight 140 lb (63.5 kg), SpO2 100%.  LABORATORY DATA: Lab Results  Component Value Date   WBC 5.7 02/18/2024   HGB 9.9 (L) 02/18/2024   HCT 27.7 (L) 02/18/2024   MCV 93.6 02/18/2024   PLT 469 (H) 02/18/2024      Chemistry      Component Value Date/Time   NA 128 (L) 02/03/2024 1104   NA 128 (L) 11/28/2023 1333   K 5.0 02/03/2024 1104   CL 96 (L) 02/03/2024 1104   CO2 24 02/03/2024 1104   BUN 28 (H) 02/03/2024 1104   BUN 27 11/28/2023 1333   CREATININE 1.02 (H) 02/03/2024 1104      Component Value Date/Time   CALCIUM  10.2 02/03/2024 1104   ALKPHOS 37 (L) 05/20/2023 1111   AST 34  02/03/2024 1104   AST 31 05/20/2023 1111   ALT 25 02/03/2024 1104   ALT 21 05/20/2023 1111   BILITOT 0.7 02/03/2024 1104   BILITOT 0.5 05/20/2023 1111       RADIOGRAPHIC STUDIES: No results found.  ASSESSMENT AND PLAN: This is a very pleasant 83 years old white female with persistent anemia and thrombocytosis.  This could be anemia of chronic disease but underlying myelodysplastic/myeloproliferative disorder could not be completely excluded at this point. Assessment and Plan Assessment & Plan Chronic anemia, likely multifactorial (iron deficiency and anemia of chronic disease) Chronic anemia, likely multifactorial, including iron deficiency and anemia of chronic disease secondary to rheumatoid arthritis. Previous tests showed normal iron and B12 levels. Considering the chronic nature and lack of response to Integra, a bone marrow biopsy is warranted to rule out other causes such as myelodysplastic syndrome or myeloproliferative disorders. - Order bone marrow biopsy to evaluate for myelodysplastic syndrome or myeloproliferative disorders - Discontinue Integra until further evaluation  Reactive thrombocytosis Reactive thrombocytosis noted alongside chronic anemia. Differential diagnosis includes myeloproliferative disorders, which will be evaluated with the bone marrow biopsy. - Evaluate thrombocytosis with bone marrow biopsy The patient was advised to call immediately if she has any concerning symptoms in the interval.    The patient voices understanding of current disease status and treatment options and is in agreement with the current care plan.  All questions were answered. The patient knows to call the clinic with any problems, questions or concerns. We can certainly see the patient much sooner if necessary.  The total time spent in the appointment was 20 minutes.  Disclaimer: This note was dictated with voice recognition software. Similar sounding words can inadvertently be  transcribed and may not be corrected upon review.

## 2024-02-19 ENCOUNTER — Other Ambulatory Visit: Payer: Self-pay | Admitting: Internal Medicine

## 2024-03-03 DIAGNOSIS — J069 Acute upper respiratory infection, unspecified: Secondary | ICD-10-CM | POA: Diagnosis not present

## 2024-03-11 ENCOUNTER — Other Ambulatory Visit: Payer: Self-pay | Admitting: Radiology

## 2024-03-11 DIAGNOSIS — D619 Aplastic anemia, unspecified: Secondary | ICD-10-CM

## 2024-03-11 NOTE — H&P (Signed)
 Chief Complaint: Persistent anemia/thrombocytosis of uncertain etiology; referred for image guided bone marrow biopsy to rule out myelodysplastic/myeloproliferative disorder   Referring Provider(s): Mohamed,M  Supervising Physician: Philip Cornet  Patient Status: [facility] - Out-pt  History of Present Illness: Felicia [name] is an 83 y.o. female ex smoker with PMH sig for colon polyp, GERD, glaucoma, hiatal hernia, hypertension, neuropathy, rheumatoid arthritis, sleep apnea as well as persistent anemia and thrombocytosis of uncertain etiology.  She is scheduled today for CT-guided bone marrow biopsy to rule out myeloproliferative/myelodysplastic disorder.  *** Patient is Full Code  Past Medical History:  Diagnosis Date   Adenomatous colon polyp 2004   Colo in 2009 no polyps   Complication of anesthesia    small airway per patient   Concussion    Echocardiogram    Echo 8/22 EF 60-65, no RWMA, mild LVH, normal RVSF, moderately elevated PASP (RVSP 50.9), trivial MR, mild AI, AV sclerosis without stenosis   GERD (gastroesophageal reflux disease)    Glaucoma    Hiatal hernia    Hypertension    Neuropathy    Nuclear stress test    Myoview  8/22: EF 65, no ischemia; low risk   Rheumatoid arthritis(714.0)    Orencia ; Methotrexate  (Dr. Monna)   Sleep apnea    on CPAP    Past Surgical History:  Procedure Laterality Date   CARDIAC CATHETERIZATION N/A 03/29/2015   Procedure: Left Heart Cath and Coronary Angiography;  Surgeon: Victory LELON Sharps, MD;  Location: [facility] INVASIVE CV LAB;  Service: Cardiovascular;  Laterality: N/A;   CATARACT EXTRACTION, BILATERAL     COLONOSCOPY W/ POLYPECTOMY     X1; negative subsequently   DILATION AND CURETTAGE OF UTERUS     EYE SURGERY     due to RA   EYE SURGERY Left 06/2017   eyelid tendon    HYSTEROSCOPY WITH D & C  06/24/2012   Procedure: DILATATION AND CURETTAGE /HYSTEROSCOPY;  Surgeon: Rosaline DELENA Luna, MD;  Location: [facility] ORS;  Service:  Gynecology;  Laterality: N/A;   SHOULDER SURGERY Left    due to RA   SQUAMOUS CELL CARCINOMA EXCISION     scalp and left leg   TRABECULECTOMY     X 2 OD; X 1 OS   UPPER GASTROINTESTINAL ENDOSCOPY     hiatal hernia    Allergies: Sulfonamide derivatives, Amlodipine , and Norvasc  [amlodipine  besylate]  Medications: Prior to Admission medications   Medication Sig Start Date End Date Taking? Authorizing Provider  Apoaequorin (PREVAGEN PO) Take 1 tablet by mouth daily.    [provider]  Artificial Tear Solution (GENTEAL TEARS OP) Apply to eye.    [provider]  baclofen  (LIORESAL ) 10 MG tablet Take 1 tablet (10 mg total) by mouth 2 (two) times daily. Patient not taking: Reported on 02/18/2024 11/11/23   Williams, Megan E, NP  Bempedoic Acid-Ezetimibe  (NEXLIZET ) 180-10 MG TABS TAKE 1 TABLET BY MOUTH DAILY 05/14/23   Lelon Hamilton T, PA-C  Carboxymethylcellulose Sodium (REFRESH OP) Apply 1 drop to eye daily as needed (FOR DRY EYES).    [provider]  cloNIDine (CATAPRES) 0.1 MG tablet Take 1 tablet (0.1 mg total) by mouth daily as needed. If SBP >=180 09/04/23   Chandrasekhar, Mahesh A, MD  Cyanocobalamin  2500 MCG SUBL as directed Sublingual    [provider]  diazepam  (VALIUM ) 5 MG tablet Take one tablet by mouth with light food one hour prior to procedure. Patient not taking: Reported on 02/18/2024 01/02/24  Williams, Megan E, NP  erythromycin ophthalmic ointment 1 Application at bedtime.    [provider]  esomeprazole  (NEXIUM ) 40 MG capsule TAKE 1 CAPSULE BY MOUTH TWICE DAILY BEFORE MEALS 02/19/24   Avram Lupita BRAVO, MD  estradiol  (ESTRACE ) 1 MG tablet Take 1 mg by mouth daily. 02/24/15   [provider]  FeFum-FePoly-FA-B Cmp-C-Biot (INTEGRA PLUS ) CAPS TAKE ONE CAPSULE BY MOUTH DAILY Patient not taking: Reported on 02/18/2024 09/22/23   Sherrod Sherrod, MD  folic acid  (FOLVITE ) 1 MG tablet TAKE 2 TABLETS(2 MG) BY MOUTH DAILY 01/22/24    Cheryl Waddell HERO, PA-C  gabapentin  (NEURONTIN ) 100 MG capsule Take 3 capsules (300 mg total) by mouth at bedtime. 09/25/23   Leigh Venetia CROME, MD  isosorbide  mononitrate (IMDUR ) 30 MG 24 hr tablet TAKE 1 TABLET BY MOUTH EVERY DAY 04/18/23   Chandrasekhar, Mahesh A, MD  latanoprost  (XALATAN ) 0.005 % ophthalmic solution Place 1 drop into the left eye at bedtime.    [provider]  losartan -hydrochlorothiazide  (HYZAAR ) 100-25 MG tablet Take 1 tablet by mouth daily. 11/14/23   Chandrasekhar, Stanly LABOR, MD  methotrexate  (RHEUMATREX) 2.5 MG tablet Take 3 tablets (7.5 mg total) by mouth once a week. Caution:Chemotherapy. Protect from light. 02/05/24   Cheryl Waddell HERO, PA-C  metoprolol  tartrate (LOPRESSOR ) 25 MG tablet Take 1 tablet (25 mg total) by mouth 2 (two) times daily. Please call [phone] to schedule a November appointment for future refills. Thank you. 01/09/24   Lelon Hamilton T, PA-C  metroNIDAZOLE  (METROGEL ) 0.75 % gel Apply 1 application topically 2 (two) times daily.  07/09/18   [provider]  Multiple Vitamins-Minerals (CENTRUM SILVER PO) Take 1 capsule by mouth daily.    [provider]  Polyethylene Glycol 3350 (MIRALAX PO) Take 1 Dose by mouth as needed.    [provider]  progesterone  (PROMETRIUM ) 200 MG capsule Take 200 mg by mouth daily. 06/17/22   [provider]  RINVOQ  15 MG TB24 TAKE 1 TABLET BY MOUTH EVERY DAY 12/29/23   Cheryl Waddell HERO, PA-C  timolol  (TIMOPTIC ) 0.5 % ophthalmic solution Place 1 drop into both eyes daily. One drop in left eye every morning One drop in right eye every evening    [provider]  valACYclovir  (VALTREX ) 1000 MG tablet Take 1,000 mg by mouth as needed (fever blister). Reported on 06/14/2015    [provider]  Vibegron (GEMTESA) 75 MG TABS Take 1 tablet by mouth daily.    [provider]  Wheat Dextrin (BENEFIBER DRINK MIX PO) Take 1 Scoop by mouth at bedtime.    [provider]   Zoledronic  Acid (RECLAST  IV) Inject into the vein once. Once a year    [provider]     Family History  Problem Relation Age of Onset   Dementia Mother    Lung disease Mother        bronchiectasis   Heart disease Mother        Aortic Stenosis   Osteoporosis Mother    Heart attack Father 52       S/P CBAG   Ulcerative colitis Brother    Stroke Maternal Grandmother 92   Diabetes Neg Hx    Cancer Neg Hx    Colon cancer Neg Hx    Esophageal cancer Neg Hx    Stomach cancer Neg Hx    Pancreatic cancer Neg Hx    Liver disease Neg Hx     Social History   Socioeconomic History  Marital status: Single    Spouse name: Not on file   Number of children: 0   Years of education: Not on file   Highest education level: Not on file  Occupational History   Occupation: retired    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 Left to write and right with every thing else    Are you currently employed ?    What is your current occupation?retire   Do you live at home alone? no   Who lives with you? Family friend   What type of home do you live in: 1 story or 2 story? one       Social Drivers of Corporate investment banker Strain: Not on file  Food Insecurity: No Food Insecurity (02/18/2024)   Hunger  Vital Sign    Worried About Running Out of Food in the Last Year: Never true    Ran Out of Food in the Last Year: Never true  Transportation Needs: No Transportation Needs (02/18/2024)   PRAPARE - Administrator, Civil Service (Medical): No    Lack of Transportation (Non-Medical): No  Physical Activity: Not on file  Stress: Not on file  Social Connections: Not on file       Review of Systems  Vital Signs:   Advance Care Plan: no documents on file  Physical Exam  Imaging: No results found.  Labs:  CBC: Recent Labs    08/20/23 1116 11/21/23 1033 02/03/24 1104 02/18/24 1053  WBC 5.7 6.7 8.5 5.7  HGB 10.4* 11.0* 10.4* 9.9*  HCT 30.6* 33.5* 31.6* 27.7*  PLT 384 443* 541* 469*    COAGS: No results for input(s): INR, APTT in the last 8760 hours.  BMP: Recent Labs    05/20/23 1111 07/25/23 1218 11/21/23 1033 11/28/23 1333 02/03/24 1104  NA 133* 135 128* 128* 128*  K 4.5 4.1 4.8 4.7 5.0  CL 103 106 97* 96 96*  CO2 24 21* 23 17* 24  GLUCOSE 87 97 85 98 101*  BUN 22 14 19 27  28*  CALCIUM  10.1 9.6 9.6 10.0 10.2  CREATININE 0.92 0.86 1.09* 0.94 1.02*  GFRNONAA >60 >60  --   --   --     LIVER FUNCTION TESTS: Recent Labs    05/20/23 1111 06/03/23 1038 08/11/23 1351 11/21/23 1033 02/03/24 1104  BILITOT 0.5  --  0.5 0.4 0.7  AST 31  --  27 27 34  ALT 21  --  22 20 25   ALKPHOS 37*  --   --   --   --   PROT 6.6 6.5 6.6 6.2 6.9  ALBUMIN 4.4  --   --   --   --  TUMOR MARKERS: No results for input(s): AFPTM, CEA, CA199, CHROMGRNA in the last 8760 hours.  Assessment and Plan: 83 y.o. female ex smoker with PMH sig for colon polyp, GERD, glaucoma, hiatal hernia, hypertension, neuropathy, rheumatoid arthritis, sleep apnea as well as persistent anemia and thrombocytosis of uncertain etiology.  She is scheduled today for CT-guided bone marrow biopsy to rule out myeloproliferative/myelodysplastic disorder.Risks and benefits of procedure  was discussed with the patient   including, but not limited to bleeding, infection, damage to adjacent structures or low yield requiring additional tests.  All of the questions were answered and there is agreement to proceed.  Consent signed and in chart.    Thank you for allowing our service to participate in Felicia [name] 's care.  Electronically Signed: D. Franky Rakers, PA-C   03/11/2024, 1:50 PM      I spent a total of  20 minutes   in face to face in clinical consultation, greater than 50% of which was counseling/coordinating care for image guided bone marrow biopsy iii

## 2024-03-12 ENCOUNTER — Telehealth: Payer: Self-pay | Admitting: Physical Medicine and Rehabilitation

## 2024-03-12 ENCOUNTER — Other Ambulatory Visit: Payer: Self-pay

## 2024-03-12 ENCOUNTER — Encounter (HOSPITAL_COMMUNITY): Payer: Self-pay

## 2024-03-12 ENCOUNTER — Ambulatory Visit (HOSPITAL_COMMUNITY)
Admission: RE | Admit: 2024-03-12 | Discharge: 2024-03-12 | Disposition: A | Discharge status: Home / Self Care | Source: Ambulatory Visit | Attending: Internal Medicine | Admitting: Internal Medicine

## 2024-03-12 ENCOUNTER — Other Ambulatory Visit: Payer: Self-pay | Admitting: Physical Medicine and Rehabilitation

## 2024-03-12 ENCOUNTER — Telehealth: Payer: Self-pay

## 2024-03-12 DIAGNOSIS — D649 Anemia, unspecified: Secondary | ICD-10-CM | POA: Diagnosis not present

## 2024-03-12 DIAGNOSIS — M5416 Radiculopathy, lumbar region: Secondary | ICD-10-CM

## 2024-03-12 DIAGNOSIS — D619 Aplastic anemia, unspecified: Secondary | ICD-10-CM | POA: Diagnosis not present

## 2024-03-12 DIAGNOSIS — D6489 Other specified anemias: Secondary | ICD-10-CM | POA: Diagnosis not present

## 2024-03-12 DIAGNOSIS — Z01818 Encounter for other preprocedural examination: Secondary | ICD-10-CM | POA: Insufficient documentation

## 2024-03-12 LAB — CBC WITH DIFFERENTIAL/PLATELET
Abs Immature Granulocytes: 0.12 K/uL — ABNORMAL HIGH (ref 0.00–0.07)
Basophils Absolute: 0 K/uL (ref 0.0–0.1)
Basophils Relative: 1 %
Eosinophils Absolute: 0 K/uL (ref 0.0–0.5)
Eosinophils Relative: 1 %
HCT: 30.6 % — ABNORMAL LOW (ref 36.0–46.0)
Hemoglobin: 10.3 g/dL — ABNORMAL LOW (ref 12.0–15.0)
Immature Granulocytes: 1 %
Lymphocytes Relative: 22 %
Lymphs Abs: 1.8 K/uL (ref 0.7–4.0)
MCH: 33.1 pg (ref 26.0–34.0)
MCHC: 33.7 g/dL (ref 30.0–36.0)
MCV: 98.4 fL (ref 80.0–100.0)
Monocytes Absolute: 0.7 K/uL (ref 0.1–1.0)
Monocytes Relative: 8 %
Neutro Abs: 5.7 K/uL (ref 1.7–7.7)
Neutrophils Relative %: 67 %
Platelets: 539 K/uL — ABNORMAL HIGH (ref 150–400)
RBC: 3.11 MIL/uL — ABNORMAL LOW (ref 3.87–5.11)
RDW: 13.7 % (ref 11.5–15.5)
WBC: 8.4 K/uL (ref 4.0–10.5)
nRBC: 0 % (ref 0.0–0.2)

## 2024-03-12 MED ORDER — SODIUM CHLORIDE 0.9 % IV SOLN: INTRAVENOUS | Status: DC

## 2024-03-12 MED ORDER — MIDAZOLAM HCL 2 MG/2ML IJ SOLN
INTRAMUSCULAR | Status: AC | PRN
  Administered 2024-03-12: 1 mg via INTRAVENOUS

## 2024-03-12 MED ORDER — FENTANYL CITRATE (PF) 100 MCG/2ML IJ SOLN
INTRAMUSCULAR | Status: AC
  Filled 2024-03-12: qty 2

## 2024-03-12 MED ORDER — MIDAZOLAM HCL 2 MG/2ML IJ SOLN
INTRAMUSCULAR | Status: AC
Start: 2024-03-12 — End: 2024-03-12
  Filled 2024-03-12: qty 2

## 2024-03-12 MED ORDER — FENTANYL CITRATE (PF) 100 MCG/2ML IJ SOLN
INTRAMUSCULAR | Status: AC | PRN
  Administered 2024-03-12 (×2): 25 ug via INTRAVENOUS

## 2024-03-12 NOTE — Telephone Encounter (Signed)
 Per Dr. Eldonna order for [facility] places

## 2024-03-12 NOTE — Telephone Encounter (Signed)
 Pt called stating that they want to make an epidural injection. Pt call back number is [phone]

## 2024-03-12 NOTE — Telephone Encounter (Signed)
 Pt requesting a appointment for a spinal injection

## 2024-03-12 NOTE — Procedures (Signed)
Interventional Radiology Procedure:   Indications: Anemia  Procedure: CT guided bone marrow biopsy  Findings: 2 aspirates and 1 core from right ilium  Complications: None     EBL: Minimal, less than 10 ml  Plan: Discharge to home in one hour.   Adam R. Henn, MD  Pager: [phone]    

## 2024-03-12 NOTE — Progress Notes (Signed)
 Left L4-5 interlam esi ordred

## 2024-03-16 LAB — SURGICAL PATHOLOGY

## 2024-03-17 ENCOUNTER — Ambulatory Visit: Admitting: Internal Medicine

## 2024-03-17 ENCOUNTER — Ambulatory Visit: Admitting: Oncology

## 2024-03-17 ENCOUNTER — Inpatient Hospital Stay

## 2024-03-17 ENCOUNTER — Inpatient Hospital Stay: Attending: Internal Medicine | Admitting: Internal Medicine

## 2024-03-17 ENCOUNTER — Other Ambulatory Visit

## 2024-03-17 VITALS — BP 124/82 | HR 70 | Temp 97.2°F | Resp 17 | Ht 65.0 in | Wt 139.7 lb

## 2024-03-17 DIAGNOSIS — G8929 Other chronic pain: Secondary | ICD-10-CM | POA: Insufficient documentation

## 2024-03-17 DIAGNOSIS — M25552 Pain in left hip: Secondary | ICD-10-CM | POA: Insufficient documentation

## 2024-03-17 DIAGNOSIS — D75838 Other thrombocytosis: Secondary | ICD-10-CM | POA: Insufficient documentation

## 2024-03-17 DIAGNOSIS — D539 Nutritional anemia, unspecified: Secondary | ICD-10-CM

## 2024-03-17 DIAGNOSIS — M069 Rheumatoid arthritis, unspecified: Secondary | ICD-10-CM | POA: Diagnosis not present

## 2024-03-17 DIAGNOSIS — D638 Anemia in other chronic diseases classified elsewhere: Secondary | ICD-10-CM | POA: Insufficient documentation

## 2024-03-17 DIAGNOSIS — D619 Aplastic anemia, unspecified: Secondary | ICD-10-CM

## 2024-03-17 LAB — CMP (CANCER CENTER ONLY)
ALT: 19 U/L (ref 0–44)
AST: 29 U/L (ref 15–41)
Albumin: 4.4 g/dL (ref 3.5–5.0)
Alkaline Phosphatase: 45 U/L (ref 38–126)
Anion gap: 5 (ref 5–15)
BUN: 30 mg/dL — ABNORMAL HIGH (ref 8–23)
CO2: 25 mmol/L (ref 22–32)
Calcium: 10.1 mg/dL (ref 8.9–10.3)
Chloride: 98 mmol/L (ref 98–111)
Creatinine: 0.99 mg/dL (ref 0.44–1.00)
GFR, Estimated: 57 mL/min — ABNORMAL LOW (ref >60)
Glucose, Bld: 98 mg/dL (ref 70–99)
Potassium: 4.2 mmol/L (ref 3.5–5.1)
Sodium: 128 mmol/L — ABNORMAL LOW (ref 135–145)
Total Bilirubin: 0.6 mg/dL (ref 0.0–1.2)
Total Protein: 6.9 g/dL (ref 6.5–8.1)

## 2024-03-17 LAB — CBC WITH DIFFERENTIAL (CANCER CENTER ONLY)
Abs Immature Granulocytes: 0.1 K/uL — ABNORMAL HIGH (ref 0.00–0.07)
Basophils Absolute: 0.1 K/uL (ref 0.0–0.1)
Basophils Relative: 1 %
Eosinophils Absolute: 0 K/uL (ref 0.0–0.5)
Eosinophils Relative: 1 %
HCT: 29.3 % — ABNORMAL LOW (ref 36.0–46.0)
Hemoglobin: 10.2 g/dL — ABNORMAL LOW (ref 12.0–15.0)
Immature Granulocytes: 1 %
Lymphocytes Relative: 18 %
Lymphs Abs: 1.5 K/uL (ref 0.7–4.0)
MCH: 33.6 pg (ref 26.0–34.0)
MCHC: 34.8 g/dL (ref 30.0–36.0)
MCV: 96.4 fL (ref 80.0–100.0)
Monocytes Absolute: 0.5 K/uL (ref 0.1–1.0)
Monocytes Relative: 6 %
Neutro Abs: 6.3 K/uL (ref 1.7–7.7)
Neutrophils Relative %: 73 %
Platelet Count: 528 K/uL — ABNORMAL HIGH (ref 150–400)
RBC: 3.04 MIL/uL — ABNORMAL LOW (ref 3.87–5.11)
RDW: 13.5 % (ref 11.5–15.5)
WBC Count: 8.5 K/uL (ref 4.0–10.5)
nRBC: 0 % (ref 0.0–0.2)

## 2024-03-17 LAB — IRON AND IRON BINDING CAPACITY (CC-WL,HP ONLY)
Iron: 125 ug/dL (ref 28–170)
Saturation Ratios: 29 % (ref 10.4–31.8)
TIBC: 431 ug/dL (ref 250–450)
UIBC: 306 ug/dL (ref 148–442)

## 2024-03-17 LAB — FERRITIN: Ferritin: 341 ng/mL — ABNORMAL HIGH (ref 11–307)

## 2024-03-17 MED ORDER — METHYLPREDNISOLONE 4 MG PO TBPK: ORAL_TABLET | ORAL | 0 refills | Status: DC

## 2024-03-17 NOTE — Progress Notes (Signed)
 [facility] [phone]   [phone]  OFFICE PROGRESS NOTE  Cleotilde Planas, MD [address] [place] [address]  DIAGNOSIS:  1) Chronic anemia likely multifactorial, including iron deficiency and chronic disease due to rheumatoid arthritis.  2) Reactive Thrombocytosis; Elevated platelet counts likely secondary to iron deficiency anemia.  PRIOR THERAPY: None  CURRENT THERAPY: Over-the-counter ferrous sulfate  INTERVAL HISTORY: BRADLEE BRIDGERS 83 y.o. female returns to the clinic today for 23-month follow-up visit accompanied by her friend.Discussed the use of AI scribe software for clinical note transcription with the patient, who gave verbal consent to proceed.  History of Present Illness KRISTENA WILHELMI is an 83 year old female with chronic anemia and reactive thrombocytosis who presents for evaluation and discussion of her biopsy results.  She has chronic anemia and is currently taking Integra Plus , an iron supplement, one capsule every other day. A recent bone marrow biopsy and aspirate showed no concerning abnormalities. Her hemoglobin is 10.2 and hematocrit is 29.3, with elevated platelets at 528,000, consistent with past results. She experiences fatigue, which she attributes partly to anemia and partly to rheumatoid arthritis.  She experiences significant pain in her left hip and leg, described as sciatica-like, which radiates down her leg and affects her knees. She describes her pain as related to arthritis and an irritated nerve, which she believes may originate from her spinal column. She uses a cane and reports that the pain interrupts her sleep. She has received steroid injections in the past for pain management and is awaiting another injection scheduled for November 4th. She has difficulty accessing timely pain management care.  Her current medications include Rinvoq  daily and two folic acid  tablets daily. She has previously taken  prednisone  but is concerned about its side effects. Integra Plus  sometimes causes constipation.    MEDICAL HISTORY: Past Medical History:  Diagnosis Date   Adenomatous colon polyp 2004   Colo in 2009 no polyps   Complication of anesthesia    small airway per patient   Concussion    Echocardiogram    Echo 8/22 EF 60-65, no RWMA, mild LVH, normal RVSF, moderately elevated PASP (RVSP 50.9), trivial MR, mild AI, AV sclerosis without stenosis   GERD (gastroesophageal reflux disease)    Glaucoma    Hiatal hernia    Hypertension    Neuropathy    Nuclear stress test    Myoview  8/22: EF 65, no ischemia; low risk   Rheumatoid arthritis(714.0)    Orencia ; Methotrexate  (Dr. Monna)   Sleep apnea    on CPAP    ALLERGIES:  is allergic to sulfonamide derivatives, amlodipine , and norvasc  [amlodipine  besylate].  MEDICATIONS:  Current Outpatient Medications  Medication Sig Dispense Refill   Apoaequorin (PREVAGEN PO) Take 1 tablet by mouth daily.     Artificial Tear Solution (GENTEAL TEARS OP) Apply to eye.     baclofen  (LIORESAL ) 10 MG tablet Take 1 tablet (10 mg total) by mouth 2 (two) times daily. 30 each 0   Bempedoic Acid-Ezetimibe  (NEXLIZET ) 180-10 MG TABS TAKE 1 TABLET BY MOUTH DAILY 90 tablet 3   Carboxymethylcellulose Sodium (REFRESH OP) Apply 1 drop to eye daily as needed (FOR DRY EYES).     cloNIDine (CATAPRES) 0.1 MG tablet Take 1 tablet (0.1 mg total) by mouth daily as needed. If SBP >=180     Cyanocobalamin  2500 MCG SUBL as directed Sublingual     diazepam  (VALIUM ) 5 MG tablet Take one tablet  by mouth with light food one hour prior to procedure. 1 tablet 0   erythromycin ophthalmic ointment 1 Application at bedtime.     esomeprazole  (NEXIUM ) 40 MG capsule TAKE 1 CAPSULE BY MOUTH TWICE DAILY BEFORE MEALS 180 capsule 1   estradiol  (ESTRACE ) 1 MG tablet Take 1 mg by mouth daily.  0   FeFum-FePoly-FA-B Cmp-C-Biot (INTEGRA PLUS ) CAPS TAKE ONE CAPSULE BY MOUTH DAILY 30 capsule 3    folic acid  (FOLVITE ) 1 MG tablet TAKE 2 TABLETS(2 MG) BY MOUTH DAILY 180 tablet 3   gabapentin  (NEURONTIN ) 100 MG capsule Take 3 capsules (300 mg total) by mouth at bedtime. 270 capsule 3   isosorbide  mononitrate (IMDUR ) 30 MG 24 hr tablet TAKE 1 TABLET BY MOUTH EVERY DAY 90 tablet 3   latanoprost  (XALATAN ) 0.005 % ophthalmic solution Place 1 drop into the left eye at bedtime.     losartan -hydrochlorothiazide  (HYZAAR ) 100-25 MG tablet Take 1 tablet by mouth daily. 90 tablet 3   methotrexate  (RHEUMATREX) 2.5 MG tablet Take 3 tablets (7.5 mg total) by mouth once a week. Caution:Chemotherapy. Protect from light.     metoprolol  tartrate (LOPRESSOR ) 25 MG tablet Take 1 tablet (25 mg total) by mouth 2 (two) times daily. Please call [phone] to schedule a November appointment for future refills. Thank you. 180 tablet 0   metroNIDAZOLE  (METROGEL ) 0.75 % gel Apply 1 application topically 2 (two) times daily.      Multiple Vitamins-Minerals (CENTRUM SILVER PO) Take 1 capsule by mouth daily.     Polyethylene Glycol 3350 (MIRALAX PO) Take 1 Dose by mouth as needed.     progesterone  (PROMETRIUM ) 200 MG capsule Take 200 mg by mouth daily.     RINVOQ  15 MG TB24 TAKE 1 TABLET BY MOUTH EVERY DAY 30 tablet 2   timolol  (TIMOPTIC ) 0.5 % ophthalmic solution Place 1 drop into both eyes daily. One drop in left eye every morning One drop in right eye every evening     valACYclovir  (VALTREX ) 1000 MG tablet Take 1,000 mg by mouth as needed (fever blister). Reported on 06/14/2015     Vibegron (GEMTESA) 75 MG TABS Take 1 tablet by mouth daily.     Wheat Dextrin (BENEFIBER DRINK MIX PO) Take 1 Scoop by mouth at bedtime.     Zoledronic  Acid (RECLAST  IV) Inject into the vein once. Once a year     No current facility-administered medications for this visit.    SURGICAL HISTORY:  Past Surgical History:  Procedure Laterality Date   CARDIAC CATHETERIZATION N/A 03/29/2015   Procedure: Left Heart Cath and Coronary  Angiography;  Surgeon: Victory LELON Sharps, MD;  Location: [facility] INVASIVE CV LAB;  Service: Cardiovascular;  Laterality: N/A;   CATARACT EXTRACTION, BILATERAL     COLONOSCOPY W/ POLYPECTOMY     X1; negative subsequently   DILATION AND CURETTAGE OF UTERUS     EYE SURGERY     due to RA   EYE SURGERY Left 06/2017   eyelid tendon    HYSTEROSCOPY WITH D & C  06/24/2012   Procedure: DILATATION AND CURETTAGE /HYSTEROSCOPY;  Surgeon: Rosaline DELENA Luna, MD;  Location: [facility] ORS;  Service: Gynecology;  Laterality: N/A;   SHOULDER SURGERY Left    due to RA   SQUAMOUS CELL CARCINOMA EXCISION     scalp and left leg   TRABECULECTOMY     X 2 OD; X 1 OS   UPPER GASTROINTESTINAL ENDOSCOPY     hiatal hernia    REVIEW OF  SYSTEMS:  Constitutional: positive for fatigue Eyes: negative Ears, nose, mouth, throat, and face: negative Respiratory: negative Cardiovascular: negative Gastrointestinal: negative Genitourinary:negative Integument/breast: negative Hematologic/lymphatic: negative Musculoskeletal:positive for arthralgias Neurological: negative Behavioral/Psych: negative Endocrine: negative Allergic/Immunologic: negative   PHYSICAL EXAMINATION: General appearance: alert, cooperative, fatigued, and no distress Head: Normocephalic, without obvious abnormality, atraumatic Neck: no adenopathy, no JVD, supple, symmetrical, trachea midline, and thyroid  not enlarged, symmetric, no tenderness/mass/nodules Lymph nodes: Cervical, supraclavicular, and axillary nodes normal. Resp: clear to auscultation bilaterally Back: symmetric, no curvature. ROM normal. No CVA tenderness. Cardio: regular rate and rhythm, S1, S2 normal, no murmur, click, rub or gallop GI: soft, non-tender; bowel sounds normal; no masses,  no organomegaly Extremities: extremities normal, atraumatic, no cyanosis or edema  ECOG PERFORMANCE STATUS: 1 - Symptomatic but completely ambulatory  Blood pressure 124/82, pulse 70, temperature (!) 97.2 F  (36.2 C), resp. rate 17, height 5' 5 (1.651 m), weight 139 lb 11.2 oz (63.4 kg), SpO2 99%.  LABORATORY DATA: Lab Results  Component Value Date   WBC 8.5 03/17/2024   HGB 10.2 (L) 03/17/2024   HCT 29.3 (L) 03/17/2024   MCV 96.4 03/17/2024   PLT 528 (H) 03/17/2024      Chemistry      Component Value Date/Time   NA 128 (L) 03/17/2024 0923   NA 128 (L) 11/28/2023 1333   K 4.2 03/17/2024 0923   CL 98 03/17/2024 0923   CO2 25 03/17/2024 0923   BUN 30 (H) 03/17/2024 0923   BUN 27 11/28/2023 1333   CREATININE 0.99 03/17/2024 0923   CREATININE 1.02 (H) 02/03/2024 1104      Component Value Date/Time   CALCIUM  10.1 03/17/2024 0923   ALKPHOS 45 03/17/2024 0923   AST 29 03/17/2024 0923   ALT 19 03/17/2024 0923   BILITOT 0.6 03/17/2024 0923       RADIOGRAPHIC STUDIES: CT BONE MARROW BIOPSY & ASPIRATION Result Date: 03/12/2024 INDICATION: 83 year old with anemia and thrombocytosis. EXAM: CT GUIDED BONE MARROW ASPIRATES AND BIOPSY Physician: Juliene SAUNDERS. Philip, MD MEDICATIONS: None. ANESTHESIA/SEDATION: Moderate (conscious) sedation was employed during this procedure. A total of Versed  1 mg and fentanyl  50 mcg was administered intravenously at the order of the provider performing the procedure. Total intra-service moderate sedation time: 11 minutes. Patient's level of consciousness and vital signs were monitored continuously by radiology nurse throughout the procedure under the supervision of the provider performing the procedure. COMPLICATIONS: None immediate. PROCEDURE: The procedure was explained to the patient. The risks and benefits of the procedure were discussed and the patient's questions were addressed. Informed consent was obtained from the patient. The patient was placed prone on CT table. Images of the pelvis were obtained. The right side of back was prepped and draped in sterile fashion. The skin and right posterior ilium were anesthetized with 1% lidocaine . 11 gauge bone needle was  directed into the right ilium with CT guidance. Two aspirates and one core biopsy were obtained. Bandage placed over the puncture site. FINDINGS: Bone needle directed into the posterior right ilium. IMPRESSION: CT guided bone marrow aspiration and core biopsy. Electronically Signed   By: Juliene Philip M.D.   On: 03/12/2024 12:06    ASSESSMENT AND PLAN: This is a very pleasant 83 years old white female with persistent anemia and thrombocytosis.  This could be anemia of chronic disease. She had a bone marrow biopsy and aspirate performed recently that showed no concerning findings for myelodysplastic or myeloproliferative disorder. Her condition was thought to be secondary  in nature likely from her rheumatoid arthritis. Assessment and Plan Assessment & Plan Anemia of chronic disease secondary to rheumatoid arthritis Chronic anemia likely due to rheumatoid arthritis. Bone marrow biopsy showed no concerning abnormalities, ruling out leukemia and myelodysplastic syndrome. Hemoglobin is well-managed at 10.2 g/dL, and iron levels are above normal, indicating no need for iron infusion. Fatigue is attributed to both anemia and rheumatoid arthritis. - Continue Integra Plus , one capsule every other day - Consider over-the-counter iron supplement with vitamin C every other day  Reactive thrombocytosis Platelet count remains elevated at 528,000/L, consistent with previous results. No new abnormalities detected in recent lab work.  Chronic pain due to arthritis and nerve irritation Chronic pain in the left hip and leg, likely due to arthritis and nerve irritation, possibly sciatica. Pain management is challenging due to limited access to timely steroid injections. - Prescribe Medrol  Dosepak to manage pain until next appointment with pain management specialist - Encourage contacting rheumatologist for potential earlier intervention The patient was advised to call immediately if she has any other concerning  symptoms in the interim.   The patient voices understanding of current disease status and treatment options and is in agreement with the current care plan.  All questions were answered. The patient knows to call the clinic with any problems, questions or concerns. We can certainly see the patient much sooner if necessary.  The total time spent in the appointment was 30 minutes.  Disclaimer: This note was dictated with voice recognition software. Similar sounding words can inadvertently be transcribed and may not be corrected upon review.

## 2024-03-23 ENCOUNTER — Other Ambulatory Visit: Payer: Self-pay | Admitting: Physician Assistant

## 2024-03-23 ENCOUNTER — Encounter (HOSPITAL_COMMUNITY): Payer: Self-pay | Admitting: Internal Medicine

## 2024-03-23 DIAGNOSIS — M0579 Rheumatoid arthritis with rheumatoid factor of multiple sites without organ or systems involvement: Secondary | ICD-10-CM

## 2024-03-23 NOTE — Telephone Encounter (Signed)
 Last Fill: 12/29/2023  Labs: 03/17/2024 RBC 3.04, Hgb 10.2, Hct 29.3, Platelet Count 528, Abs Immature Granulocytes 0.10, Sodium 128, BUN 30, GFR 57  TB Gold: 11/21/2023 Neg    Next Visit: 07/06/2024  Last Visit: 02/03/2024  DX: Rheumatoid arthritis involving multiple sites with positive rheumatoid factor   Current Dose per office note 02/03/2024: Rinvoq  15 mg 1 tablet by mouth daily   Okay to refill Rinvoq ?
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
## 2024-03-26 ENCOUNTER — Encounter (HOSPITAL_COMMUNITY): Payer: Self-pay | Admitting: Internal Medicine

## 2024-03-30 ENCOUNTER — Other Ambulatory Visit: Payer: Self-pay | Admitting: Physician Assistant

## 2024-03-30 NOTE — Telephone Encounter (Signed)
 Last Fill: 12/24/2023  Labs: 03/17/2024 RBC 3.04, Hgb 10.2, Hct 29.3, Platelet Count 528, Abs Immature Granulocytes 0.10, Sodium 128, BUN 30, GFR 57  Next Visit: 07/06/2024  Last Visit: 02/03/2024  DX: Rheumatoid arthritis involving multiple sites with positive rheumatoid factor   Current Dose per office note 02/03/2024: Methotrexate  4 tablets by mouth every 7 days   Okay to refill Methotrexate ?

## 2024-04-01 ENCOUNTER — Ambulatory Visit: Admitting: Neurology

## 2024-04-01 ENCOUNTER — Encounter: Payer: Self-pay | Admitting: Neurology

## 2024-04-01 VITALS — BP 142/62 | HR 88 | Ht 65.0 in | Wt 138.0 lb

## 2024-04-01 DIAGNOSIS — F419 Anxiety disorder, unspecified: Secondary | ICD-10-CM | POA: Diagnosis not present

## 2024-04-01 DIAGNOSIS — H547 Unspecified visual loss: Secondary | ICD-10-CM | POA: Diagnosis not present

## 2024-04-01 DIAGNOSIS — G4733 Obstructive sleep apnea (adult) (pediatric): Secondary | ICD-10-CM | POA: Diagnosis not present

## 2024-04-01 DIAGNOSIS — G629 Polyneuropathy, unspecified: Secondary | ICD-10-CM

## 2024-04-01 DIAGNOSIS — R413 Other amnesia: Secondary | ICD-10-CM

## 2024-04-01 DIAGNOSIS — M0579 Rheumatoid arthritis with rheumatoid factor of multiple sites without organ or systems involvement: Secondary | ICD-10-CM

## 2024-04-01 DIAGNOSIS — F0781 Postconcussional syndrome: Secondary | ICD-10-CM | POA: Diagnosis not present

## 2024-04-01 NOTE — Patient Instructions (Signed)
 -Sleep medicine referral for sleep apnea  -Discussed importance of mental health. You could discuss with PCP or let me know if you want a referral to someone. -Continue to see rheumatology for RA -See Dr. Eldonna for back injection as planned -Continue gabapentin  300 mg at bedtime  Return to clinic in 6 months  The physicians and staff at [facility] are committed to providing excellent care. You may receive a survey requesting feedback about your experience at our office. We strive to receive very good responses to the survey questions. If you feel that your experience would prevent you from giving the office a very good  response, please contact our office to try to remedy the situation. We may be reached at [phone]. Thank you for taking the time out of your busy day to complete the survey.  Felicia Potters, MD [facility]  Preventing Falls at [facility] are common, often dreaded events in the lives of older people. Aside from the obvious injuries and even death that may result, fall can cause wide-ranging consequences including loss of independence, mental decline, decreased activity and mobility. Younger people are also at risk of falling, especially those with chronic illnesses and fatigue.  Ways to reduce risk for falling Examine diet and medications. Warm foods and alcohol dilate blood vessels, which can lead to dizziness when standing. Sleep aids, antidepressants and pain medications can also increase the likelihood of a fall.  Get a vision exam. Poor vision, cataracts and glaucoma increase the chances of falling.  Check foot gear. Shoes should fit snugly and have a sturdy, nonskid sole and a broad, low heel  Participate in a physician-approved exercise program to build and maintain muscle strength and improve balance and coordination. Programs that use ankle weights or stretch bands are excellent for muscle-strengthening. Water aerobics programs and low-impact Tai  Chi programs have also been shown to improve balance and coordination.  Increase vitamin D  intake. Vitamin D  improves muscle strength and increases the amount of calcium  the body is able to absorb and deposit in bones.  How to prevent falls from common hazards Floors - Remove all loose wires, cords, and throw rugs. Minimize clutter. Make sure rugs are anchored and smooth. Keep furniture in its usual place.  Chairs -- Use chairs with straight backs, armrests and firm seats. Add firm cushions to existing pieces to add height.  Bathroom - Install grab bars and non-skid tape in the tub or shower. Use a bathtub transfer bench or a shower chair with a back support Use an elevated toilet seat and/or safety rails to assist standing from a low surface. Do not use towel racks or bathroom tissue holders to help you stand.  Lighting - Make sure halls, stairways, and entrances are well-lit. Install a night light in your bathroom or hallway. Make sure there is a light switch at the top and bottom of the staircase. Turn lights on if you get up in the middle of the night. Make sure lamps or light switches are within reach of the bed if you have to get up during the night.  Kitchen - Install non-skid rubber mats near the sink and stove. Clean spills immediately. Store frequently used utensils, pots, pans between waist and eye level. This helps prevent reaching and bending. Sit when getting things out of lower cupboards.  Living room/ Bedrooms - Place furniture with wide spaces in between, giving enough room to move around. Establish a route through the living room that gives you  something to hold onto as you walk.  Stairs - Make sure treads, rails, and rugs are secure. Install a rail on both sides of the stairs. If stairs are a threat, it might be helpful to arrange most of your activities on the lower level to reduce the number of times you must climb the stairs.  Entrances and doorways - Install metal handles on  the walls adjacent to the doorknobs of all doors to make it more secure as you travel through the doorway.  Tips for maintaining balance Keep at least one hand free at all times. Try using a backpack or fanny pack to hold things rather than carrying them in your hands. Never carry objects in both hands when walking as this interferes with keeping your balance.  Attempt to swing both arms from front to back while walking. This might require a conscious effort if Parkinson's disease has diminished your movement. It will, however, help you to maintain balance and posture, and reduce fatigue.  Consciously lift your feet off of the ground when walking. Shuffling and dragging of the feet is a common culprit in losing your balance.  When trying to navigate turns, use a U technique of facing forward and making a wide turn, rather than pivoting sharply.  Try to stand with your feet shoulder-length apart. When your feet are close together for any length of time, you increase your risk of losing your balance and falling.  Do one thing at a time. Don't try to walk and accomplish another task, such as reading or looking around. The decrease in your automatic reflexes complicates motor function, so the less distraction, the better.  Do not wear rubber or gripping soled shoes, they might catch on the floor and cause tripping.  Move slowly when changing positions. Use deliberate, concentrated movements and, if needed, use a grab bar or walking aid. Count 15 seconds between each movement. For example, when rising from a seated position, wait 15 seconds after standing to begin walking.  If balance is a continuous problem, you might want to consider a walking aid such as a cane, walking stick, or walker. Once you've mastered walking with help, you might be ready to try it on your own again.

## 2024-04-05 ENCOUNTER — Encounter: Payer: Self-pay | Admitting: Radiology

## 2024-04-06 ENCOUNTER — Other Ambulatory Visit: Payer: Self-pay

## 2024-04-06 ENCOUNTER — Ambulatory Visit: Admitting: Physical Medicine and Rehabilitation

## 2024-04-06 ENCOUNTER — Other Ambulatory Visit: Payer: Self-pay | Admitting: Physician Assistant

## 2024-04-06 VITALS — BP 164/77 | HR 93

## 2024-04-06 DIAGNOSIS — M5416 Radiculopathy, lumbar region: Secondary | ICD-10-CM | POA: Diagnosis not present

## 2024-04-06 DIAGNOSIS — R399 Unspecified symptoms and signs involving the genitourinary system: Secondary | ICD-10-CM | POA: Diagnosis not present

## 2024-04-06 DIAGNOSIS — N3946 Mixed incontinence: Secondary | ICD-10-CM | POA: Diagnosis not present

## 2024-04-06 DIAGNOSIS — R351 Nocturia: Secondary | ICD-10-CM | POA: Diagnosis not present

## 2024-04-06 MED ORDER — METHYLPREDNISOLONE ACETATE 40 MG/ML IJ SUSP
40.0000 mg | Freq: Once | INTRAMUSCULAR | Status: AC
  Administered 2024-04-06: 40 mg

## 2024-04-06 NOTE — Progress Notes (Signed)
 Pain Scale   Average Pain 6 Patient advised she has chronic lower back pain radiating to left side patient advising her pain is constant        +Driver, -BT, -Dye Allergies.

## 2024-04-18 NOTE — Procedures (Signed)
 Lumbar Epidural Steroid Injection - Interlaminar Approach with Fluoroscopic Guidance  Patient: Felicia [name]      Date of Birth: 24-Jun-1940 MRN: [id number] PCP: Cleotilde Planas, MD      Visit Date: 04/06/2024   Universal Protocol:     Consent Given By: the patient  Position: PRONE  Additional Comments: Vital signs were monitored before and after the procedure. Patient was prepped and draped in the usual sterile fashion. The correct patient, procedure, and site was verified.   Injection Procedure Details:   Procedure diagnoses: Lumbar radiculopathy [M54.16]   Meds Administered:  Meds ordered this encounter  Medications   methylPREDNISolone  acetate (DEPO-MEDROL ) injection 40 mg     Laterality: Left  Location/Site:  L4-5  Needle: 3.5 in., 20 ga. Tuohy  Needle Placement: Paramedian epidural  Findings:   -Comments: Excellent flow of contrast into the epidural space.  Procedure Details: Using a paramedian approach from the side mentioned above, the region overlying the inferior lamina was localized under fluoroscopic visualization and the soft tissues overlying this structure were infiltrated with 4 ml. of 1% Lidocaine  without Epinephrine. The Tuohy needle was inserted into the epidural space using a paramedian approach.   The epidural space was localized using loss of resistance along with counter oblique bi-planar fluoroscopic views.  After negative aspirate for air, blood, and CSF, a 2 ml. volume of Isovue-250 was injected into the epidural space and the flow of contrast was observed. Radiographs were obtained for documentation purposes.    The injectate was administered into the level noted above.   Additional Comments:  The patient tolerated the procedure well Dressing: 2 x 2 sterile gauze and Band-Aid    Post-procedure details: Patient was observed during the procedure. Post-procedure instructions were reviewed.  Patient left the clinic in stable condition.

## 2024-04-18 NOTE — Progress Notes (Signed)
 Felicia [name] - 83 y.o. female MRN [id number]  Date of birth: 01-Jul-1940  Office Visit Note: Visit Date: 04/06/2024 PCP: Cleotilde Planas, MD Referred by: Cleotilde Planas, MD  Subjective: Chief Complaint  Patient presents with   Lower Back - Pain   HPI:  Felicia [name] is a 83 y.o. female who comes in today at the request of Duwaine Pouch, FNP for planned Left L4-5 Lumbar Interlaminar epidural steroid injection with fluoroscopic guidance.  The patient has failed conservative care including home exercise, medications, time and activity modification.  This injection will be diagnostic and hopefully therapeutic.  Please see requesting physician notes for further details and justification.   ROS Otherwise per HPI.  Assessment & Plan: Visit Diagnoses:    ICD-10-CM   1. Lumbar radiculopathy  M54.16 XR C-ARM NO REPORT    Epidural Steroid injection    methylPREDNISolone  acetate (DEPO-MEDROL ) injection 40 mg      Plan: No additional findings.   Meds & Orders:  Meds ordered this encounter  Medications   methylPREDNISolone  acetate (DEPO-MEDROL ) injection 40 mg    Orders Placed This Encounter  Procedures   XR C-ARM NO REPORT   Epidural Steroid injection    Follow-up: Return for visit to requesting provider as needed.   Procedures: No procedures performed  Lumbar Epidural Steroid Injection - Interlaminar Approach with Fluoroscopic Guidance  Patient: Felicia [name]      Date of Birth: 04/25/1941 MRN: [id number] PCP: Cleotilde Planas, MD      Visit Date: 04/06/2024   Universal Protocol:     Consent Given By: the patient  Position: PRONE  Additional Comments: Vital signs were monitored before and after the procedure. Patient was prepped and draped in the usual sterile fashion. The correct patient, procedure, and site was verified.   Injection Procedure Details:   Procedure diagnoses: Lumbar radiculopathy [M54.16]   Meds Administered:  Meds ordered this encounter  Medications    methylPREDNISolone  acetate (DEPO-MEDROL ) injection 40 mg     Laterality: Left  Location/Site:  L4-5  Needle: 3.5 in., 20 ga. Tuohy  Needle Placement: Paramedian epidural  Findings:   -Comments: Excellent flow of contrast into the epidural space.  Procedure Details: Using a paramedian approach from the side mentioned above, the region overlying the inferior lamina was localized under fluoroscopic visualization and the soft tissues overlying this structure were infiltrated with 4 ml. of 1% Lidocaine  without Epinephrine. The Tuohy needle was inserted into the epidural space using a paramedian approach.   The epidural space was localized using loss of resistance along with counter oblique bi-planar fluoroscopic views.  After negative aspirate for air, blood, and CSF, a 2 ml. volume of Isovue-250 was injected into the epidural space and the flow of contrast was observed. Radiographs were obtained for documentation purposes.    The injectate was administered into the level noted above.   Additional Comments:  The patient tolerated the procedure well Dressing: 2 x 2 sterile gauze and Band-Aid    Post-procedure details: Patient was observed during the procedure. Post-procedure instructions were reviewed.  Patient left the clinic in stable condition.   Clinical History: Narrative & Impression CLINICAL DATA:  Low back pain. Symptoms persist with greater than 6 weeks of treatment. Low back pain radiates to both buttocks and hips with some involvement of the legs below that. Some numbness.   EXAM: MRI LUMBAR SPINE WITHOUT CONTRAST   TECHNIQUE: Multiplanar, multisequence MR imaging of the lumbar spine was performed. No intravenous contrast  was administered.   COMPARISON:  07/18/2020   FINDINGS: Segmentation:  5 lumbar type vertebral bodies.   Alignment: Scoliotic curvature convex to the right with the apex at L3. Degenerative anterolisthesis at L3-4 of 2 mm and L4-5 of 4 mm.    Vertebrae: No fracture or focal bone lesion. Some edematous endplate marrow changes at L4-5 and L5-S1 could contribute to low back pain.   Conus medullaris and cauda equina: Conus extends to the L1 level. Conus and cauda equina appear normal.   Paraspinal and other soft tissues: Negative   Disc levels:   No significant finding from T10-11 through L1-2.   L2-3: Disc degeneration with loss of disc height. Endplate osteophytes and bulging of the disc. Mild facet osteoarthritis. No compressive stenosis. No change.   L3-4: Bilateral facet degeneration and hypertrophy. 2 mm of degenerative anterolisthesis. Bulging of the disc slightly more prominent towards the left. Mild narrowing of the left lateral recess but no apparent neural compression.   L4-5: Bilateral facet arthropathy with anterolisthesis of 4 mm. Chronic broad-based disc herniation more prominent towards the left with upward migration of disc material in the left lateral recess towards the proximal foramen. Neural compression is possible in the left lateral recess and proximal foramen. Findings appear similar to the study of 2022.   L5-S1: Shallow central disc protrusion with slight caudal down turning. No compressive narrowing of the canal or foramina. Minimal worsening since 2022.   IMPRESSION: 1. L4-5: Bilateral facet arthropathy with 4 mm of anterolisthesis. Chronic broad-based disc herniation more prominent towards the left with upward migration of disc material in the left lateral recess towards the proximal foramen. Neural compression is possible in the left lateral recess and proximal foramen. Findings appear similar to the study of 2022. 2. L3-4: Facet arthropathy with 2 mm of anterolisthesis. Bulging of the disc more prominent towards the left. Mild narrowing of the left lateral recess but no apparent neural compression. 3. L5-S1: Shallow central disc protrusion with slight caudal down turning. No  compressive stenosis. Minimal worsening since 2022. 4. L2-3: Disc degeneration with loss of disc height. Mild facet osteoarthritis. No compressive stenosis. 5. Some edematous endplate marrow changes at L4-5 and L5-S1 could contribute to low back pain.     Electronically Signed   By: Oneil Officer M.D.   On: 01/28/2024 10:12     Objective:  VS:  HT:    WT:   BMI:     BP:(!) 164/77  HR:93bpm  TEMP: ( )  RESP:  Physical Exam Vitals and nursing note reviewed.  Constitutional:      General: She is not in acute distress.    Appearance: Normal appearance. She is not ill-appearing.  HENT:     Head: Normocephalic and atraumatic.     Right Ear: External ear normal.     Left Ear: External ear normal.  Eyes:     Extraocular Movements: Extraocular movements intact.  Cardiovascular:     Rate and Rhythm: Normal rate.     Pulses: Normal pulses.  Pulmonary:     Effort: Pulmonary effort is normal. No respiratory distress.  Abdominal:     General: There is no distension.     Palpations: Abdomen is soft.  Musculoskeletal:        General: Tenderness present.     Cervical back: Neck supple.     Right lower leg: No edema.     Left lower leg: No edema.     Comments: Patient has good  distal strength with no pain over the greater trochanters.  No clonus or focal weakness.  Skin:    Findings: No erythema, lesion or rash.  Neurological:     General: No focal deficit present.     Mental Status: She is alert and oriented to person, place, and time.     Sensory: No sensory deficit.     Motor: No weakness or abnormal muscle tone.     Coordination: Coordination normal.  Psychiatric:        Mood and Affect: Mood normal.        Behavior: Behavior normal.      Imaging: No results found.

## 2024-04-20 ENCOUNTER — Encounter: Payer: Self-pay | Admitting: Rheumatology

## 2024-04-20 ENCOUNTER — Encounter: Payer: Self-pay | Admitting: Physical Medicine and Rehabilitation

## 2024-04-21 DIAGNOSIS — D638 Anemia in other chronic diseases classified elsewhere: Secondary | ICD-10-CM | POA: Diagnosis not present

## 2024-04-21 DIAGNOSIS — R7303 Prediabetes: Secondary | ICD-10-CM | POA: Diagnosis not present

## 2024-04-21 DIAGNOSIS — M810 Age-related osteoporosis without current pathological fracture: Secondary | ICD-10-CM | POA: Diagnosis not present

## 2024-04-21 DIAGNOSIS — D75838 Other thrombocytosis: Secondary | ICD-10-CM | POA: Diagnosis not present

## 2024-04-21 DIAGNOSIS — Z1331 Encounter for screening for depression: Secondary | ICD-10-CM | POA: Diagnosis not present

## 2024-04-21 DIAGNOSIS — Z6823 Body mass index (BMI) 23.0-23.9, adult: Secondary | ICD-10-CM | POA: Diagnosis not present

## 2024-04-21 DIAGNOSIS — N1831 Chronic kidney disease, stage 3a: Secondary | ICD-10-CM | POA: Diagnosis not present

## 2024-04-21 DIAGNOSIS — Z0000 Encounter for general adult medical examination without abnormal findings: Secondary | ICD-10-CM | POA: Diagnosis not present

## 2024-04-21 NOTE — Telephone Encounter (Signed)
 It does not appear that Dr. Gatha felt the need for the patient to have an iron fusion at this time since the anemia has been stable. Anemia of chronic disease is common in patients with RA.  No change in therapy recommended at this time since her RA has been well controlled. We will continue to monitor anemia closely.

## 2024-04-26 ENCOUNTER — Other Ambulatory Visit: Payer: Self-pay | Admitting: Internal Medicine

## 2024-04-27 DIAGNOSIS — L0231 Cutaneous abscess of buttock: Secondary | ICD-10-CM | POA: Diagnosis not present

## 2024-04-27 DIAGNOSIS — H401233 Low-tension glaucoma, bilateral, severe stage: Secondary | ICD-10-CM | POA: Diagnosis not present

## 2024-04-29 ENCOUNTER — Other Ambulatory Visit: Payer: Self-pay | Admitting: Physician Assistant

## 2024-06-13 ENCOUNTER — Other Ambulatory Visit: Payer: Self-pay | Admitting: Physician Assistant

## 2024-06-13 DIAGNOSIS — M0579 Rheumatoid arthritis with rheumatoid factor of multiple sites without organ or systems involvement: Secondary | ICD-10-CM

## 2024-06-14 NOTE — Telephone Encounter (Signed)
 Last Fill: 03/23/2024  Labs: 03/17/2024 Sodium 128, BUN 30 GFR 57, RBC 3.04, Hgb 10.2, Hct 29.3, Platelet Count 528, Abs Immature Granulocytes 0.10  TB Gold: 11/21/2023 Neg    Next Visit: 07/06/2024  Last Visit: 02/03/2024  DX: Rheumatoid arthritis involving multiple sites with positive rheumatoid factor   Current Dose per office note 02/03/2024: Rinvoq  15 mg 1 tablet by mouth daily   Patient advised she is due to update her lab work. Patient states she will try to update it this week.   Okay to refill Rinvoq ?

## 2024-06-16 ENCOUNTER — Other Ambulatory Visit: Payer: Self-pay | Admitting: *Deleted

## 2024-06-16 DIAGNOSIS — Z79899 Other long term (current) drug therapy: Secondary | ICD-10-CM

## 2024-06-17 ENCOUNTER — Ambulatory Visit: Payer: Self-pay | Admitting: Physician Assistant

## 2024-06-17 LAB — COMPREHENSIVE METABOLIC PANEL WITH GFR
AG Ratio: 2 (calc) (ref 1.0–2.5)
ALT: 17 U/L (ref 6–29)
AST: 26 U/L (ref 10–35)
Albumin: 4.3 g/dL (ref 3.6–5.1)
Alkaline phosphatase (APISO): 36 U/L — ABNORMAL LOW (ref 37–153)
BUN/Creatinine Ratio: 27 (calc) — ABNORMAL HIGH (ref 6–22)
BUN: 26 mg/dL — ABNORMAL HIGH (ref 7–25)
CO2: 24 mmol/L (ref 20–32)
Calcium: 9.8 mg/dL (ref 8.6–10.4)
Chloride: 100 mmol/L (ref 98–110)
Creat: 0.97 mg/dL — ABNORMAL HIGH (ref 0.60–0.95)
Globulin: 2.2 g/dL (ref 1.9–3.7)
Glucose, Bld: 108 mg/dL — ABNORMAL HIGH (ref 65–99)
Potassium: 4.8 mmol/L (ref 3.5–5.3)
Sodium: 132 mmol/L — ABNORMAL LOW (ref 135–146)
Total Bilirubin: 0.5 mg/dL (ref 0.2–1.2)
Total Protein: 6.5 g/dL (ref 6.1–8.1)
eGFR: 58 mL/min/1.73m2 — ABNORMAL LOW

## 2024-06-17 LAB — CBC WITH DIFFERENTIAL/PLATELET
Absolute Lymphocytes: 1650 {cells}/uL (ref 850–3900)
Absolute Monocytes: 570 {cells}/uL (ref 200–950)
Basophils Absolute: 37 {cells}/uL (ref 0–200)
Basophils Relative: 0.5 %
Eosinophils Absolute: 37 {cells}/uL (ref 15–500)
Eosinophils Relative: 0.5 %
HCT: 30.8 % — ABNORMAL LOW (ref 35.9–46.0)
Hemoglobin: 10.1 g/dL — ABNORMAL LOW (ref 11.7–15.5)
MCH: 33.1 pg — ABNORMAL HIGH (ref 27.0–33.0)
MCHC: 32.8 g/dL (ref 31.6–35.4)
MCV: 101 fL (ref 81.4–101.7)
MPV: 9.2 fL (ref 7.5–12.5)
Monocytes Relative: 7.7 %
Neutro Abs: 5106 {cells}/uL (ref 1500–7800)
Neutrophils Relative %: 69 %
Platelets: 584 Thousand/uL — ABNORMAL HIGH (ref 140–400)
RBC: 3.05 Million/uL — ABNORMAL LOW (ref 3.80–5.10)
RDW: 13 % (ref 11.0–15.0)
Total Lymphocyte: 22.3 %
WBC: 7.4 Thousand/uL (ref 3.8–10.8)

## 2024-06-17 NOTE — Progress Notes (Signed)
 Patient remains anemic--RBC count, hgb, and hct are low and continue to gradually trend down.   Platelets remain elevated.  Rest of CBC WNL.   Glucose is 108.  Creatinine improving.  GFR is low but improving.   Sodium is low but improved.   Alk phos is low but stable. Rest of CMP WNL.

## 2024-06-17 NOTE — Progress Notes (Unsigned)
 " OFFICE NOTE:    Date:  06/18/2024  ID:  Beverley FORBES Blanc, DOB December 13, 1940, MRN [id number] PCP: Cleotilde Planas, MD  [facility] Providers Cardiologist:  Stanly DELENA Leavens, MD Cardiology APP:  Lelon Glendia DASEN, PA-C        Coronary artery disease (Non-obstructive) ETT 03/2015: up-sloping ST depression in II, V4-6 Cath 03/29/15: pLAD 45 Myoview  01/24/2021: No ischemia, EF 65, low risk CCTA 05/01/22:pLAD 50-69; CAC score 163 (58%); FFR normal  Pulmonary HTN Likely due to untreated OSA >> CPAP resumed in 2022 Echocardiogram 09/2013: vigorous LVF, EF 65-70, No WMA, NL diast fxn, trivial AI, mild TR Echo 01/23/2021: EF 60-65, no RWMA, mild concentric LVH, normal RVSF, moderately elevated PASP (RVSP 50.9), trivial MR, mild AI, mild AV sclerosis without stenosis, RAP 3  TTE 04/29/2022:  EF 60-65, no RWMA, GR 1 DD, normal RVSF, trivial MR, mild AI, RAP 3, RVSP 22  PYP scan 06/2023: Grade 1, equivocal for ATTR amyloidosis Genetic testing for ATTR amyloidosis negative; deferred further testing-consider CMR in future Hypertension  Renal Art US  10/2011: no RAS  Hyperlipidemia  Intol of statins >> Bempedoic Acid/Ezetimibe   Chronic kidney disease  GERD OSA Rheumatoid arthritis Glaucoma Hyponatremia  Anemia Polyneuropathy       Discussed the use of AI scribe software for clinical note transcription with the patient, who gave verbal consent to proceed. History of Present Illness PROMISE WELDIN is a 84 y.o. female for follow up of CAD, pulm HTN.  She is a prior patient of Dr. Claudene.  She is now followed by Dr. Arnetha.  She was last seen in 04/2023.  There was high suspicion for cardiac amyloid.  Patient deferred testing at that time.  She experiences chest tightness and heaviness.  She notes that this has been a long-term symptom.  She was placed on isosorbide  for this in the past. She continues to use CPAP at night, requiring two pillows for comfort. She experiences shortness of breath over  the past year, noticeable during activities like housekeeping and walking, necessitating rest. She uses a cane for ambulation and participates in strengthening and balance classes. She also experiences fatigue and has peripheral neuropathy and rheumatoid arthritis, which she feels may have worsened recently. She has a history of anemia. She is concerned about fatigue and wonders if it is related to her anemia and rheumatoid arthritis. She monitors her blood pressure regularly and notes it often elevates during doctor visits.  Home readings are optimal.   ROS-See HPI    Studies Reviewed:  EKG Interpretation Date/Time:  Friday June 18 2024 11:25:13 EST Ventricular Rate:  67 PR Interval:  194 QRS Duration:  78 QT Interval:  368 QTC Calculation: 388 R Axis:   63  Text Interpretation: Normal sinus rhythm Normal ECG Confirmed by Lelon Glendia [phone]) on 06/18/2024 11:44:39 AM    LABS 12/27/2022: TSH 1.75 08/11/2023: Total cholesterol 138, HDL 63, LDL 58, triglycerides 89 03/17/2024: Ferritin 341 06/16/2024: Na 132, K 4.8, creatinine 0.97, eGFR 58, ALT 17, Hgb 10.1, PLT 584K  HYPERTENSION CONTROL Vitals:   06/18/24 1130 06/18/24 1234  BP: (!) 160/60 (!) 160/70    The patient's blood pressure is elevated above target today.  In order to address the patient's elevated BP: Blood pressure will be monitored at home to determine if medication changes need to be made.; The blood pressure is usually elevated in clinic.  Blood pressures monitored at home have been optimal.  Physical Exam:  VS:  BP (!) 160/70   Pulse 67   Ht 5' 5 (1.651 m)   Wt 139 lb 12.8 oz (63.4 kg)   SpO2 97%   BMI 23.26 kg/m        Wt Readings from Last 3 Encounters:  06/18/24 139 lb 12.8 oz (63.4 kg)  04/01/24 138 lb (62.6 kg)  03/17/24 139 lb 11.2 oz (63.4 kg)    Constitutional:      Appearance: Healthy appearance. Not in distress.  Neck:     Vascular: No JVR.  Pulmonary:     Breath sounds: Normal  breath sounds. No wheezing. No rales.  Cardiovascular:     Normal rate. Regular rhythm.     Murmurs: There is no murmur.  Edema:    Peripheral edema absent.  Abdominal:     Palpations: Abdomen is soft.       Assessment and Plan:    Assessment & Plan Coronary artery disease involving native coronary artery of native heart with angina pectoris Shortness of breath Nonobstructive CAD noted on coronary CTA in November 2023.  She has fairly chronic chest tightness.  Recently she has noted more shortness of breath.  Overall, she does not feel that the chest discomfort is any different.  She does take isosorbide .  CT in November 2023 demonstrated proximal LAD 50-69% stenosis.  She is due for follow-up echocardiogram.  EKG today does not demonstrate any ischemic changes.  She has a lot of symptoms that seem to be related to rheumatoid arthritis.  I have suggested proceeding with stress testing initially to evaluate chest pain and update her echocardiogram.  I have also recommended obtaining a BNP. -Order Lexiscan  Myoview  -Order echocardiogram -Order NT-proBNP -Consider changing HCTZ to furosemide if NT-proBNP is significantly elevated -Continue Imdur  30 mg daily, metoprolol  tartrate 25 mg twice daily, bempedoic acid/ezetimibe  180/10 mg daily -Start nitroglycerin  as needed for chest pain -Follow-up 6 months or sooner if testing abnormal Pulmonary HTN (HCC) It has been felt that her pulmonary hypertension is related to untreated sleep apnea in the past.  Her most recent echocardiogram in November 2023 demonstrated normal RVSP.  She also has mild aortic insufficiency, mild tricuspid regurgitation.  Recommendation has been to repeat her echocardiogram in 2026. -Order Echocardiogram  Primary hypertension Blood pressure elevated here.  She notes optimal blood pressures at home.  Her machine has been checked with her mercury cuff in the office.  I reviewed all of her blood pressure readings from home and  they's are optimal. -Continue clonidine 0.1 mg as needed for systolic blood pressure greater than 180 -Continue Imdur  30 mg daily, losartan /HCTZ 100/25 mg daily, metoprolol  tartrate 25 mg twice daily Hyperlipidemia LDL goal <70 Recent LDL optimal. -Continue bempedoic acid/ezetimibe  180/10 mg daily OSA (obstructive sleep apnea) Continue CPAP.  Continue follow-up with Dr. Shlomo as planned. LVH (left ventricular hypertrophy) Pyrophosphate scan was equivocal in January 2025.  SPEP/UPEP negative.  Genetic testing negative.  No further testing was recommended.  CMR could be considered in the future. Other fatigue Likely multifactorial and related to chronic anemia, rheumatoid arthritis.  Recent hemoglobin stable. -Order TSH.      Informed Consent   Shared Decision Making/Informed Consent The risks [chest pain, shortness of breath, cardiac arrhythmias, dizziness, blood pressure fluctuations, myocardial infarction, stroke/transient ischemic attack, nausea, vomiting, allergic reaction, radiation exposure, metallic taste sensation and life-threatening complications (estimated to be 1 in 10,000)], benefits (risk stratification, diagnosing coronary artery disease, treatment guidance) and alternatives of a  nuclear stress test were discussed in detail with Ms. Bey and she agrees to proceed.     Dispo:  Return in about 6 months (around 12/16/2024) for Routine Follow Up, w/ Dr. Santo.  Signed, Glendia Ferrier, PA-C   "

## 2024-06-17 NOTE — Assessment & Plan Note (Addendum)
 It has been felt that her pulmonary hypertension is related to untreated sleep apnea in the past.  Her most recent echocardiogram in November 2023 demonstrated normal RVSP.  She also has mild aortic insufficiency, mild tricuspid regurgitation.  Recommendation has been to repeat her echocardiogram in 2026. -Order Echocardiogram

## 2024-06-17 NOTE — Assessment & Plan Note (Addendum)
 Nonobstructive CAD noted on coronary CTA in November 2023.  She has fairly chronic chest tightness.  Recently she has noted more shortness of breath.  Overall, she does not feel that the chest discomfort is any different.  She does take isosorbide .  CT in November 2023 demonstrated proximal LAD 50-69% stenosis.  She is due for follow-up echocardiogram.  EKG today does not demonstrate any ischemic changes.  She has a lot of symptoms that seem to be related to rheumatoid arthritis.  I have suggested proceeding with stress testing initially to evaluate chest pain and update her echocardiogram.  I have also recommended obtaining a BNP. -Order Lexiscan  Myoview  -Order echocardiogram -Order NT-proBNP -Consider changing HCTZ to furosemide if NT-proBNP is significantly elevated -Continue Imdur  30 mg daily, metoprolol  tartrate 25 mg twice daily, bempedoic acid/ezetimibe  180/10 mg daily -Start nitroglycerin  as needed for chest pain -Follow-up 6 months or sooner if testing abnormal

## 2024-06-18 ENCOUNTER — Encounter: Payer: Self-pay | Admitting: Physician Assistant

## 2024-06-18 ENCOUNTER — Ambulatory Visit: Admitting: Physician Assistant

## 2024-06-18 VITALS — BP 160/70 | HR 67 | Ht 65.0 in | Wt 139.8 lb

## 2024-06-18 DIAGNOSIS — I25119 Atherosclerotic heart disease of native coronary artery with unspecified angina pectoris: Secondary | ICD-10-CM | POA: Diagnosis not present

## 2024-06-18 DIAGNOSIS — E785 Hyperlipidemia, unspecified: Secondary | ICD-10-CM

## 2024-06-18 DIAGNOSIS — G4733 Obstructive sleep apnea (adult) (pediatric): Secondary | ICD-10-CM | POA: Diagnosis not present

## 2024-06-18 DIAGNOSIS — I517 Cardiomegaly: Secondary | ICD-10-CM | POA: Diagnosis not present

## 2024-06-18 DIAGNOSIS — I272 Pulmonary hypertension, unspecified: Secondary | ICD-10-CM

## 2024-06-18 DIAGNOSIS — R0602 Shortness of breath: Secondary | ICD-10-CM | POA: Diagnosis not present

## 2024-06-18 DIAGNOSIS — R5383 Other fatigue: Secondary | ICD-10-CM | POA: Diagnosis not present

## 2024-06-18 DIAGNOSIS — I10 Essential (primary) hypertension: Secondary | ICD-10-CM

## 2024-06-18 MED ORDER — NITROGLYCERIN 0.4 MG SL SUBL: 0.4000 mg | SUBLINGUAL_TABLET | SUBLINGUAL | 3 refills | Status: AC | PRN

## 2024-06-18 NOTE — Assessment & Plan Note (Signed)
 Nonobstructive CAD noted on coronary CTA in November 2023.  She has fairly chronic chest tightness.  Recently she has noted more shortness of breath.  Overall, she does not feel that the chest discomfort is any different.  She does take isosorbide .  CT in November 2023 demonstrated proximal LAD 50-69% stenosis.  She is due for follow-up echocardiogram.  EKG today does not demonstrate any ischemic changes.  She has a lot of symptoms that seem to be related to rheumatoid arthritis.  I have suggested proceeding with stress testing initially to evaluate chest pain and update her echocardiogram.  I have also recommended obtaining a BNP. -Order Lexiscan  Myoview  -Order echocardiogram -Order NT-proBNP -Consider changing HCTZ to furosemide if NT-proBNP is significantly elevated -Continue Imdur  30 mg daily, metoprolol  tartrate 25 mg twice daily, bempedoic acid/ezetimibe  180/10 mg daily -Start nitroglycerin  as needed for chest pain -Follow-up 6 months or sooner if testing abnormal

## 2024-06-18 NOTE — Assessment & Plan Note (Signed)
 Continue CPAP.  Continue follow-up with Dr. Shlomo as planned.

## 2024-06-18 NOTE — Assessment & Plan Note (Signed)
 Blood pressure elevated here.  She notes optimal blood pressures at home.  Her machine has been checked with her mercury cuff in the office.  I reviewed all of her blood pressure readings from home and they's are optimal. -Continue clonidine 0.1 mg as needed for systolic blood pressure greater than 180 -Continue Imdur  30 mg daily, losartan /HCTZ 100/25 mg daily, metoprolol  tartrate 25 mg twice daily

## 2024-06-18 NOTE — Assessment & Plan Note (Signed)
 Recent LDL optimal. -Continue bempedoic acid/ezetimibe  180/10 mg daily

## 2024-06-18 NOTE — Patient Instructions (Addendum)
 Medication Instructions:  Nitroglycerin  has been sent to your pharmacy. Place 1 tablet (0.4 mg total) under the tongue every 5 (five) minutes as needed for chest pain   *If you need a refill on your cardiac medications before your next appointment, please call your pharmacy*   Lab Work: PRO BNP, TSH If you have labs (blood work) drawn today and your tests are completely normal, you will receive your results only by: MyChart Message (if you have MyChart) OR A paper copy in the mail If you have any lab test that is abnormal or we need to change your treatment, we will call you to review the results.   Testing/Procedures: Your physician has requested that you have an echocardiogram. Echocardiography is a painless test that uses sound waves to create images of your heart. It provides your doctor with information about the size and shape of your heart and how well your hearts chambers and valves are working. This procedure takes approximately one hour. There are no restrictions for this procedure.   Please do NOT wear perfume or lotions (deodorant is allowed).  Please arrive 15 minutes prior to your appointment time.    Your physician has requested that you have a lexiscan  myoview . For further information please visit [email or web address] Please follow instruction sheet, as given.   Your next appointment:   6 month(s)  A letter will be mailed to you as a reminder to call the office for your next follow up appointment.    Provider:   Stanly DELENA Leavens, MD or Glendia Ferrier     Follow-Up: At [facility], you and your health needs are our priority.  As part of our continuing mission to provide you with exceptional heart care, we have created designated Provider Care Teams.  These Care Teams include your primary Cardiologist (physician) and Advanced Practice Providers (APPs -  Physician Assistants and Nurse Practitioners) who all work together to provide you with the care you  need, when you need it. We recommend signing up for the patient portal called MyChart.  Sign up information is provided on this After Visit Summary.  MyChart is used to connect with patients for Virtual Visits (Telemedicine).  Patients are able to view lab/test results, encounter notes, upcoming appointments, etc.  Non-urgent messages can be sent to your provider as well.   To learn more about what you can do with MyChart, go to [email or web address].

## 2024-06-19 LAB — TSH: TSH: 1.82 u[IU]/mL (ref 0.450–4.500)

## 2024-06-19 LAB — PRO B NATRIURETIC PEPTIDE: NT-Pro BNP: 291 pg/mL (ref 0–738)

## 2024-06-21 ENCOUNTER — Ambulatory Visit: Payer: Self-pay | Admitting: Physician Assistant

## 2024-06-22 ENCOUNTER — Other Ambulatory Visit: Payer: Self-pay | Admitting: Physician Assistant

## 2024-06-22 NOTE — Telephone Encounter (Signed)
 Last Fill: 03/30/2024  Labs: 06/16/2024 Patient remains anemic--RBC count, hgb, and hct are low and continue to gradually trend down.   Platelets remain elevated.  Rest of CBC WNL.   Glucose is 108.  Creatinine improving.  GFR is low but improving.   Sodium is low but improved.   Alk phos is low but stable. Rest of CMP WNL.  Next Visit: 08/04/2024  Last Visit: 02/03/2024  DX: Rheumatoid arthritis involving multiple sites with positive rheumatoid factor (HCC)   Current Dose per office note 02/03/2024: Methotrexate  4 tablets by mouth every 7 days   Okay to refill Methotrexate ?

## 2024-06-23 ENCOUNTER — Other Ambulatory Visit: Payer: Self-pay | Admitting: Neurology

## 2024-06-23 DIAGNOSIS — G629 Polyneuropathy, unspecified: Secondary | ICD-10-CM

## 2024-07-06 ENCOUNTER — Ambulatory Visit: Admitting: Physician Assistant

## 2024-07-28 ENCOUNTER — Ambulatory Visit (HOSPITAL_COMMUNITY)

## 2024-07-28 ENCOUNTER — Encounter (HOSPITAL_COMMUNITY)

## 2024-08-03 ENCOUNTER — Ambulatory Visit: Admitting: Cardiology

## 2024-08-04 ENCOUNTER — Ambulatory Visit: Admitting: Physician Assistant

## 2024-09-15 ENCOUNTER — Inpatient Hospital Stay: Admitting: Internal Medicine

## 2024-09-15 ENCOUNTER — Inpatient Hospital Stay

## 2024-10-07 ENCOUNTER — Ambulatory Visit: Admitting: Neurology
